# Patient Record
Sex: Male | Born: 1940 | Race: White | Hispanic: No | Marital: Married | Smoking: Former smoker
Health system: Southern US, Community
[De-identification: ages and names within clinical notes are randomized; demographics above are authoritative.]

## PROBLEM LIST (undated history)

## (undated) DIAGNOSIS — J189 Pneumonia, unspecified organism: Secondary | ICD-10-CM

## (undated) DIAGNOSIS — R011 Cardiac murmur, unspecified: Secondary | ICD-10-CM

## (undated) DIAGNOSIS — H269 Unspecified cataract: Secondary | ICD-10-CM

## (undated) DIAGNOSIS — K5790 Diverticulosis of intestine, part unspecified, without perforation or abscess without bleeding: Secondary | ICD-10-CM

## (undated) DIAGNOSIS — N200 Calculus of kidney: Secondary | ICD-10-CM

## (undated) DIAGNOSIS — E039 Hypothyroidism, unspecified: Secondary | ICD-10-CM

## (undated) DIAGNOSIS — B351 Tinea unguium: Secondary | ICD-10-CM

## (undated) DIAGNOSIS — I2510 Atherosclerotic heart disease of native coronary artery without angina pectoris: Secondary | ICD-10-CM

## (undated) DIAGNOSIS — I10 Essential (primary) hypertension: Secondary | ICD-10-CM

## (undated) DIAGNOSIS — T7840XA Allergy, unspecified, initial encounter: Secondary | ICD-10-CM

## (undated) DIAGNOSIS — E8881 Metabolic syndrome: Secondary | ICD-10-CM

## (undated) DIAGNOSIS — G4730 Sleep apnea, unspecified: Secondary | ICD-10-CM

## (undated) DIAGNOSIS — M1990 Unspecified osteoarthritis, unspecified site: Secondary | ICD-10-CM

## (undated) DIAGNOSIS — K635 Polyp of colon: Secondary | ICD-10-CM

## (undated) DIAGNOSIS — K219 Gastro-esophageal reflux disease without esophagitis: Secondary | ICD-10-CM

## (undated) DIAGNOSIS — E785 Hyperlipidemia, unspecified: Secondary | ICD-10-CM

## (undated) HISTORY — DX: Metabolic syndrome: E88.81

## (undated) HISTORY — DX: Essential (primary) hypertension: I10

## (undated) HISTORY — DX: Unspecified osteoarthritis, unspecified site: M19.90

## (undated) HISTORY — DX: Atherosclerotic heart disease of native coronary artery without angina pectoris: I25.10

## (undated) HISTORY — PX: REPLACEMENT TOTAL KNEE: SUR1224

## (undated) HISTORY — DX: Cardiac murmur, unspecified: R01.1

## (undated) HISTORY — DX: Calculus of kidney: N20.0

## (undated) HISTORY — DX: Hypothyroidism, unspecified: E03.9

## (undated) HISTORY — DX: Pneumonia, unspecified organism: J18.9

## (undated) HISTORY — DX: Metabolic syndrome: E88.810

## (undated) HISTORY — DX: Polyp of colon: K63.5

## (undated) HISTORY — DX: Unspecified cataract: H26.9

## (undated) HISTORY — PX: CARDIAC CATHETERIZATION: SHX172

## (undated) HISTORY — DX: Hyperlipidemia, unspecified: E78.5

## (undated) HISTORY — DX: Allergy, unspecified, initial encounter: T78.40XA

## (undated) HISTORY — PX: OTHER SURGICAL HISTORY: SHX169

## (undated) HISTORY — DX: Diverticulosis of intestine, part unspecified, without perforation or abscess without bleeding: K57.90

## (undated) HISTORY — PX: EYE SURGERY: SHX253

## (undated) HISTORY — DX: Gastro-esophageal reflux disease without esophagitis: K21.9

## (undated) HISTORY — DX: Sleep apnea, unspecified: G47.30

## (undated) HISTORY — DX: Tinea unguium: B35.1

## (undated) HISTORY — PX: COLONOSCOPY: SHX174

## (undated) HISTORY — PX: JOINT REPLACEMENT: SHX530

---

## 1998-05-09 ENCOUNTER — Ambulatory Visit (HOSPITAL_COMMUNITY): Admission: RE | Admit: 1998-05-09 | Discharge: 1998-05-09 | Payer: Self-pay | Admitting: Gastroenterology

## 2001-08-10 ENCOUNTER — Encounter (INDEPENDENT_AMBULATORY_CARE_PROVIDER_SITE_OTHER): Payer: Self-pay | Admitting: Specialist

## 2001-08-10 ENCOUNTER — Ambulatory Visit (HOSPITAL_COMMUNITY): Admission: RE | Admit: 2001-08-10 | Discharge: 2001-08-10 | Payer: Self-pay | Admitting: Gastroenterology

## 2001-11-02 ENCOUNTER — Encounter: Payer: Self-pay | Admitting: Specialist

## 2001-11-09 ENCOUNTER — Inpatient Hospital Stay (HOSPITAL_COMMUNITY): Admission: RE | Admit: 2001-11-09 | Discharge: 2001-11-12 | Payer: Self-pay | Admitting: Specialist

## 2003-02-18 ENCOUNTER — Emergency Department (HOSPITAL_COMMUNITY): Admission: EM | Admit: 2003-02-18 | Discharge: 2003-02-18 | Payer: Self-pay | Admitting: Emergency Medicine

## 2003-02-21 ENCOUNTER — Emergency Department (HOSPITAL_COMMUNITY): Admission: EM | Admit: 2003-02-21 | Discharge: 2003-02-21 | Payer: Self-pay | Admitting: Emergency Medicine

## 2004-10-16 ENCOUNTER — Ambulatory Visit: Payer: Self-pay | Admitting: Internal Medicine

## 2005-06-07 ENCOUNTER — Ambulatory Visit: Payer: Self-pay | Admitting: Internal Medicine

## 2005-06-10 ENCOUNTER — Ambulatory Visit: Payer: Self-pay | Admitting: Internal Medicine

## 2005-08-21 ENCOUNTER — Ambulatory Visit: Payer: Self-pay | Admitting: Internal Medicine

## 2005-09-02 ENCOUNTER — Ambulatory Visit: Payer: Self-pay | Admitting: Internal Medicine

## 2005-09-12 ENCOUNTER — Ambulatory Visit: Payer: Self-pay | Admitting: Internal Medicine

## 2005-09-27 ENCOUNTER — Ambulatory Visit: Payer: Self-pay | Admitting: Internal Medicine

## 2005-10-29 ENCOUNTER — Ambulatory Visit: Payer: Self-pay | Admitting: Internal Medicine

## 2006-01-16 ENCOUNTER — Ambulatory Visit (HOSPITAL_COMMUNITY): Admission: RE | Admit: 2006-01-16 | Discharge: 2006-01-16 | Payer: Self-pay | Admitting: Orthopedic Surgery

## 2006-04-10 ENCOUNTER — Ambulatory Visit: Payer: Self-pay | Admitting: Internal Medicine

## 2006-11-26 ENCOUNTER — Ambulatory Visit: Payer: Self-pay | Admitting: Internal Medicine

## 2007-01-12 ENCOUNTER — Ambulatory Visit: Payer: Self-pay | Admitting: Internal Medicine

## 2007-03-27 ENCOUNTER — Ambulatory Visit (HOSPITAL_COMMUNITY): Admission: RE | Admit: 2007-03-27 | Discharge: 2007-03-27 | Payer: Self-pay | Admitting: Specialist

## 2007-04-28 ENCOUNTER — Telehealth: Payer: Self-pay | Admitting: Internal Medicine

## 2007-06-11 ENCOUNTER — Ambulatory Visit: Admission: RE | Admit: 2007-06-11 | Discharge: 2007-06-11 | Payer: Self-pay | Admitting: Specialist

## 2007-08-13 ENCOUNTER — Encounter: Payer: Self-pay | Admitting: Internal Medicine

## 2007-09-17 ENCOUNTER — Encounter: Payer: Self-pay | Admitting: Internal Medicine

## 2007-10-19 ENCOUNTER — Telehealth (INDEPENDENT_AMBULATORY_CARE_PROVIDER_SITE_OTHER): Payer: Self-pay | Admitting: *Deleted

## 2007-10-19 ENCOUNTER — Ambulatory Visit: Payer: Self-pay | Admitting: Internal Medicine

## 2007-10-29 LAB — CONVERTED CEMR LAB: TSH: 3.26 microintl units/mL (ref 0.35–5.50)

## 2007-10-30 ENCOUNTER — Encounter (INDEPENDENT_AMBULATORY_CARE_PROVIDER_SITE_OTHER): Payer: Self-pay | Admitting: *Deleted

## 2007-12-30 ENCOUNTER — Telehealth (INDEPENDENT_AMBULATORY_CARE_PROVIDER_SITE_OTHER): Payer: Self-pay | Admitting: *Deleted

## 2008-01-14 ENCOUNTER — Encounter: Payer: Self-pay | Admitting: Internal Medicine

## 2008-01-14 DIAGNOSIS — Z86010 Personal history of colonic polyps: Secondary | ICD-10-CM | POA: Insufficient documentation

## 2008-01-19 ENCOUNTER — Emergency Department (HOSPITAL_COMMUNITY): Admission: EM | Admit: 2008-01-19 | Discharge: 2008-01-19 | Payer: Self-pay | Admitting: Emergency Medicine

## 2008-01-22 ENCOUNTER — Emergency Department (HOSPITAL_COMMUNITY): Admission: EM | Admit: 2008-01-22 | Discharge: 2008-01-22 | Payer: Self-pay | Admitting: Emergency Medicine

## 2008-02-15 ENCOUNTER — Ambulatory Visit: Payer: Self-pay | Admitting: Internal Medicine

## 2008-02-15 DIAGNOSIS — E8881 Metabolic syndrome: Secondary | ICD-10-CM | POA: Insufficient documentation

## 2008-02-15 DIAGNOSIS — E039 Hypothyroidism, unspecified: Secondary | ICD-10-CM | POA: Insufficient documentation

## 2008-02-15 DIAGNOSIS — E785 Hyperlipidemia, unspecified: Secondary | ICD-10-CM | POA: Insufficient documentation

## 2008-02-15 DIAGNOSIS — Z8739 Personal history of other diseases of the musculoskeletal system and connective tissue: Secondary | ICD-10-CM | POA: Insufficient documentation

## 2008-02-15 LAB — CONVERTED CEMR LAB: Cholesterol, target level: 200 mg/dL

## 2008-02-18 ENCOUNTER — Encounter (INDEPENDENT_AMBULATORY_CARE_PROVIDER_SITE_OTHER): Payer: Self-pay | Admitting: *Deleted

## 2008-02-18 LAB — CONVERTED CEMR LAB
ALT: 22 units/L (ref 0–53)
AST: 27 units/L (ref 0–37)
Basophils Relative: 0.1 % (ref 0.0–1.0)
Hemoglobin: 15.3 g/dL (ref 13.0–17.0)
Hgb A1c MFr Bld: 5.6 % (ref 4.6–6.0)
LDL Cholesterol: 74 mg/dL (ref 0–99)
Lymphocytes Relative: 18.6 % (ref 12.0–46.0)
Monocytes Relative: 9.3 % (ref 3.0–12.0)
Neutro Abs: 4.1 10*3/uL (ref 1.4–7.7)
RBC: 4.7 M/uL (ref 4.22–5.81)
RDW: 11.8 % (ref 11.5–14.6)
Total Bilirubin: 1.1 mg/dL (ref 0.3–1.2)
Total CHOL/HDL Ratio: 3.7
Uric Acid, Serum: 6 mg/dL (ref 4.0–7.8)
WBC: 6 10*3/uL (ref 4.5–10.5)

## 2008-03-30 ENCOUNTER — Ambulatory Visit: Payer: Self-pay | Admitting: Family Medicine

## 2008-04-07 ENCOUNTER — Telehealth (INDEPENDENT_AMBULATORY_CARE_PROVIDER_SITE_OTHER): Payer: Self-pay | Admitting: *Deleted

## 2008-05-30 ENCOUNTER — Ambulatory Visit: Payer: Self-pay | Admitting: Internal Medicine

## 2008-05-30 DIAGNOSIS — R351 Nocturia: Secondary | ICD-10-CM | POA: Insufficient documentation

## 2008-05-30 LAB — CONVERTED CEMR LAB
Bilirubin Urine: NEGATIVE
Glucose, Urine, Semiquant: NEGATIVE
Protein, U semiquant: NEGATIVE
Specific Gravity, Urine: <1.005
pH: 5

## 2008-06-01 ENCOUNTER — Encounter (INDEPENDENT_AMBULATORY_CARE_PROVIDER_SITE_OTHER): Payer: Self-pay | Admitting: *Deleted

## 2008-06-01 LAB — CONVERTED CEMR LAB
Creatinine, Ser: 0.9 mg/dL (ref 0.4–1.5)
Hgb A1c MFr Bld: 5.8 % (ref 4.6–6.0)

## 2008-06-21 ENCOUNTER — Telehealth (INDEPENDENT_AMBULATORY_CARE_PROVIDER_SITE_OTHER): Payer: Self-pay | Admitting: *Deleted

## 2008-06-23 ENCOUNTER — Encounter: Payer: Self-pay | Admitting: Internal Medicine

## 2008-06-27 ENCOUNTER — Ambulatory Visit: Payer: Self-pay | Admitting: Internal Medicine

## 2008-06-27 DIAGNOSIS — T887XXA Unspecified adverse effect of drug or medicament, initial encounter: Secondary | ICD-10-CM | POA: Insufficient documentation

## 2008-07-12 ENCOUNTER — Encounter: Payer: Self-pay | Admitting: Internal Medicine

## 2008-07-29 ENCOUNTER — Encounter (INDEPENDENT_AMBULATORY_CARE_PROVIDER_SITE_OTHER): Payer: Self-pay | Admitting: *Deleted

## 2008-07-29 ENCOUNTER — Ambulatory Visit: Payer: Self-pay | Admitting: Internal Medicine

## 2008-07-29 DIAGNOSIS — B351 Tinea unguium: Secondary | ICD-10-CM | POA: Insufficient documentation

## 2008-08-05 ENCOUNTER — Encounter: Payer: Self-pay | Admitting: Internal Medicine

## 2008-08-05 ENCOUNTER — Ambulatory Visit: Payer: Self-pay | Admitting: Pulmonary Disease

## 2008-08-05 DIAGNOSIS — K219 Gastro-esophageal reflux disease without esophagitis: Secondary | ICD-10-CM | POA: Insufficient documentation

## 2008-08-05 DIAGNOSIS — G4733 Obstructive sleep apnea (adult) (pediatric): Secondary | ICD-10-CM | POA: Insufficient documentation

## 2008-08-05 DIAGNOSIS — Z9989 Dependence on other enabling machines and devices: Secondary | ICD-10-CM

## 2008-08-12 ENCOUNTER — Encounter: Payer: Self-pay | Admitting: Internal Medicine

## 2008-08-26 ENCOUNTER — Encounter: Payer: Self-pay | Admitting: Family Medicine

## 2008-08-26 LAB — HM COLONOSCOPY

## 2008-09-05 ENCOUNTER — Ambulatory Visit (HOSPITAL_BASED_OUTPATIENT_CLINIC_OR_DEPARTMENT_OTHER): Admission: RE | Admit: 2008-09-05 | Discharge: 2008-09-05 | Payer: Self-pay | Admitting: Pulmonary Disease

## 2008-09-05 ENCOUNTER — Encounter: Payer: Self-pay | Admitting: Pulmonary Disease

## 2008-09-07 ENCOUNTER — Ambulatory Visit: Payer: Self-pay | Admitting: Pulmonary Disease

## 2008-09-09 ENCOUNTER — Encounter: Payer: Self-pay | Admitting: Internal Medicine

## 2008-09-13 ENCOUNTER — Encounter: Payer: Self-pay | Admitting: Pulmonary Disease

## 2008-09-19 ENCOUNTER — Ambulatory Visit: Payer: Self-pay | Admitting: Pulmonary Disease

## 2008-09-30 ENCOUNTER — Ambulatory Visit: Payer: Self-pay | Admitting: Internal Medicine

## 2008-10-10 ENCOUNTER — Ambulatory Visit: Payer: Self-pay | Admitting: Internal Medicine

## 2008-10-10 ENCOUNTER — Encounter: Payer: Self-pay | Admitting: Pulmonary Disease

## 2008-10-28 HISTORY — PX: CORONARY ARTERY BYPASS GRAFT: SHX141

## 2008-10-31 ENCOUNTER — Telehealth (INDEPENDENT_AMBULATORY_CARE_PROVIDER_SITE_OTHER): Payer: Self-pay | Admitting: *Deleted

## 2008-10-31 ENCOUNTER — Encounter: Payer: Self-pay | Admitting: Pulmonary Disease

## 2008-11-18 ENCOUNTER — Encounter: Admission: RE | Admit: 2008-11-18 | Discharge: 2008-11-18 | Payer: Self-pay | Admitting: Orthopedic Surgery

## 2009-02-23 ENCOUNTER — Telehealth (INDEPENDENT_AMBULATORY_CARE_PROVIDER_SITE_OTHER): Payer: Self-pay | Admitting: *Deleted

## 2009-04-10 ENCOUNTER — Ambulatory Visit: Payer: Self-pay | Admitting: Cardiology

## 2009-04-10 ENCOUNTER — Ambulatory Visit: Payer: Self-pay | Admitting: Internal Medicine

## 2009-04-10 DIAGNOSIS — K5730 Diverticulosis of large intestine without perforation or abscess without bleeding: Secondary | ICD-10-CM | POA: Insufficient documentation

## 2009-04-10 DIAGNOSIS — I10 Essential (primary) hypertension: Secondary | ICD-10-CM | POA: Insufficient documentation

## 2009-04-11 ENCOUNTER — Encounter (INDEPENDENT_AMBULATORY_CARE_PROVIDER_SITE_OTHER): Payer: Self-pay | Admitting: *Deleted

## 2009-04-12 ENCOUNTER — Encounter: Payer: Self-pay | Admitting: Internal Medicine

## 2009-04-14 ENCOUNTER — Encounter (INDEPENDENT_AMBULATORY_CARE_PROVIDER_SITE_OTHER): Payer: Self-pay | Admitting: *Deleted

## 2009-04-14 ENCOUNTER — Telehealth (INDEPENDENT_AMBULATORY_CARE_PROVIDER_SITE_OTHER): Payer: Self-pay | Admitting: *Deleted

## 2009-04-21 ENCOUNTER — Encounter: Payer: Self-pay | Admitting: Internal Medicine

## 2009-04-27 ENCOUNTER — Ambulatory Visit (HOSPITAL_BASED_OUTPATIENT_CLINIC_OR_DEPARTMENT_OTHER): Admission: RE | Admit: 2009-04-27 | Discharge: 2009-04-27 | Payer: Self-pay | Admitting: Urology

## 2009-04-28 ENCOUNTER — Encounter: Payer: Self-pay | Admitting: Pulmonary Disease

## 2009-05-08 DIAGNOSIS — R011 Cardiac murmur, unspecified: Secondary | ICD-10-CM | POA: Insufficient documentation

## 2009-05-09 ENCOUNTER — Ambulatory Visit: Payer: Self-pay | Admitting: Cardiovascular Disease

## 2009-05-09 DIAGNOSIS — R0602 Shortness of breath: Secondary | ICD-10-CM | POA: Insufficient documentation

## 2009-05-09 DIAGNOSIS — I4949 Other premature depolarization: Secondary | ICD-10-CM | POA: Insufficient documentation

## 2009-05-24 ENCOUNTER — Telehealth (INDEPENDENT_AMBULATORY_CARE_PROVIDER_SITE_OTHER): Payer: Self-pay | Admitting: *Deleted

## 2009-05-25 ENCOUNTER — Ambulatory Visit: Payer: Self-pay | Admitting: Cardiovascular Disease

## 2009-05-25 ENCOUNTER — Ambulatory Visit: Payer: Self-pay

## 2009-05-25 ENCOUNTER — Inpatient Hospital Stay (HOSPITAL_COMMUNITY): Admission: RE | Admit: 2009-05-25 | Discharge: 2009-06-03 | Payer: Self-pay | Admitting: Cardiovascular Disease

## 2009-05-25 ENCOUNTER — Encounter: Payer: Self-pay | Admitting: Internal Medicine

## 2009-05-25 ENCOUNTER — Encounter: Payer: Self-pay | Admitting: Cardiovascular Disease

## 2009-05-26 ENCOUNTER — Ambulatory Visit: Payer: Self-pay | Admitting: Surgery

## 2009-05-26 ENCOUNTER — Encounter: Payer: Self-pay | Admitting: Cardiovascular Disease

## 2009-05-26 ENCOUNTER — Encounter: Payer: Self-pay | Admitting: Cardiology

## 2009-05-29 ENCOUNTER — Encounter: Payer: Self-pay | Admitting: Cardiovascular Disease

## 2009-05-30 ENCOUNTER — Encounter: Payer: Self-pay | Admitting: Internal Medicine

## 2009-06-05 ENCOUNTER — Encounter: Payer: Self-pay | Admitting: Pulmonary Disease

## 2009-06-08 ENCOUNTER — Encounter: Payer: Self-pay | Admitting: Cardiovascular Disease

## 2009-06-08 ENCOUNTER — Ambulatory Visit: Payer: Self-pay | Admitting: Cardiothoracic Surgery

## 2009-06-09 ENCOUNTER — Encounter: Payer: Self-pay | Admitting: Cardiovascular Disease

## 2009-06-13 ENCOUNTER — Telehealth (INDEPENDENT_AMBULATORY_CARE_PROVIDER_SITE_OTHER): Payer: Self-pay | Admitting: *Deleted

## 2009-06-20 ENCOUNTER — Ambulatory Visit: Payer: Self-pay | Admitting: Surgery

## 2009-06-20 ENCOUNTER — Ambulatory Visit: Payer: Self-pay | Admitting: Cardiovascular Disease

## 2009-06-20 ENCOUNTER — Encounter: Admission: RE | Admit: 2009-06-20 | Discharge: 2009-06-20 | Payer: Self-pay | Admitting: Surgery

## 2009-06-20 DIAGNOSIS — I4891 Unspecified atrial fibrillation: Secondary | ICD-10-CM | POA: Insufficient documentation

## 2009-06-20 DIAGNOSIS — Z951 Presence of aortocoronary bypass graft: Secondary | ICD-10-CM | POA: Insufficient documentation

## 2009-06-22 ENCOUNTER — Encounter (HOSPITAL_COMMUNITY): Admission: RE | Admit: 2009-06-22 | Discharge: 2009-09-20 | Payer: Self-pay | Admitting: Cardiovascular Disease

## 2009-06-22 ENCOUNTER — Encounter: Payer: Self-pay | Admitting: Cardiovascular Disease

## 2009-06-23 ENCOUNTER — Telehealth (INDEPENDENT_AMBULATORY_CARE_PROVIDER_SITE_OTHER): Payer: Self-pay | Admitting: *Deleted

## 2009-06-29 ENCOUNTER — Ambulatory Visit: Payer: Self-pay | Admitting: Cardiothoracic Surgery

## 2009-06-30 ENCOUNTER — Telehealth: Payer: Self-pay | Admitting: Cardiovascular Disease

## 2009-07-04 ENCOUNTER — Ambulatory Visit: Payer: Self-pay | Admitting: Surgery

## 2009-07-06 ENCOUNTER — Encounter: Payer: Self-pay | Admitting: Cardiovascular Disease

## 2009-07-07 ENCOUNTER — Encounter: Payer: Self-pay | Admitting: Cardiovascular Disease

## 2009-07-13 ENCOUNTER — Encounter: Payer: Self-pay | Admitting: Cardiovascular Disease

## 2009-07-14 ENCOUNTER — Encounter: Payer: Self-pay | Admitting: Internal Medicine

## 2009-07-17 ENCOUNTER — Telehealth: Payer: Self-pay | Admitting: Cardiovascular Disease

## 2009-07-24 ENCOUNTER — Encounter: Payer: Self-pay | Admitting: Cardiovascular Disease

## 2009-07-26 ENCOUNTER — Encounter (INDEPENDENT_AMBULATORY_CARE_PROVIDER_SITE_OTHER): Payer: Self-pay | Admitting: *Deleted

## 2009-07-31 ENCOUNTER — Encounter: Payer: Self-pay | Admitting: Cardiovascular Disease

## 2009-08-01 ENCOUNTER — Ambulatory Visit: Payer: Self-pay | Admitting: Cardiovascular Disease

## 2009-08-01 ENCOUNTER — Encounter (INDEPENDENT_AMBULATORY_CARE_PROVIDER_SITE_OTHER): Payer: Self-pay | Admitting: *Deleted

## 2009-08-10 ENCOUNTER — Encounter: Payer: Self-pay | Admitting: Cardiovascular Disease

## 2009-08-10 ENCOUNTER — Ambulatory Visit: Payer: Self-pay | Admitting: Internal Medicine

## 2009-08-18 ENCOUNTER — Encounter: Payer: Self-pay | Admitting: Cardiovascular Disease

## 2009-08-21 ENCOUNTER — Ambulatory Visit: Payer: Self-pay | Admitting: Pulmonary Disease

## 2009-08-25 ENCOUNTER — Encounter: Payer: Self-pay | Admitting: Cardiovascular Disease

## 2009-09-04 ENCOUNTER — Encounter: Payer: Self-pay | Admitting: Pulmonary Disease

## 2009-09-12 ENCOUNTER — Encounter: Payer: Self-pay | Admitting: Cardiovascular Disease

## 2009-09-21 ENCOUNTER — Encounter (HOSPITAL_COMMUNITY): Admission: RE | Admit: 2009-09-21 | Discharge: 2009-10-25 | Payer: Self-pay | Admitting: Cardiovascular Disease

## 2009-09-28 ENCOUNTER — Telehealth: Payer: Self-pay | Admitting: Cardiovascular Disease

## 2009-11-14 ENCOUNTER — Encounter: Payer: Self-pay | Admitting: Internal Medicine

## 2009-12-04 ENCOUNTER — Ambulatory Visit: Payer: Self-pay | Admitting: Internal Medicine

## 2009-12-13 ENCOUNTER — Encounter: Payer: Self-pay | Admitting: Pulmonary Disease

## 2009-12-19 ENCOUNTER — Telehealth (INDEPENDENT_AMBULATORY_CARE_PROVIDER_SITE_OTHER): Payer: Self-pay | Admitting: *Deleted

## 2010-01-24 ENCOUNTER — Ambulatory Visit: Payer: Self-pay | Admitting: Cardiovascular Disease

## 2010-01-31 ENCOUNTER — Telehealth (INDEPENDENT_AMBULATORY_CARE_PROVIDER_SITE_OTHER): Payer: Self-pay | Admitting: *Deleted

## 2010-05-18 ENCOUNTER — Ambulatory Visit: Payer: Self-pay | Admitting: Internal Medicine

## 2010-05-18 DIAGNOSIS — I2510 Atherosclerotic heart disease of native coronary artery without angina pectoris: Secondary | ICD-10-CM | POA: Insufficient documentation

## 2010-05-18 DIAGNOSIS — Z87442 Personal history of urinary calculi: Secondary | ICD-10-CM | POA: Insufficient documentation

## 2010-05-25 ENCOUNTER — Telehealth (INDEPENDENT_AMBULATORY_CARE_PROVIDER_SITE_OTHER): Payer: Self-pay | Admitting: *Deleted

## 2010-06-22 ENCOUNTER — Telehealth (INDEPENDENT_AMBULATORY_CARE_PROVIDER_SITE_OTHER): Payer: Self-pay | Admitting: *Deleted

## 2010-06-28 ENCOUNTER — Ambulatory Visit: Payer: Self-pay | Admitting: Cardiovascular Disease

## 2010-08-28 ENCOUNTER — Ambulatory Visit: Payer: Self-pay | Admitting: Internal Medicine

## 2010-10-02 ENCOUNTER — Encounter: Payer: Self-pay | Admitting: Internal Medicine

## 2010-10-17 ENCOUNTER — Telehealth: Payer: Self-pay | Admitting: Internal Medicine

## 2010-11-19 ENCOUNTER — Telehealth (INDEPENDENT_AMBULATORY_CARE_PROVIDER_SITE_OTHER): Payer: Self-pay | Admitting: *Deleted

## 2010-11-22 ENCOUNTER — Ambulatory Visit
Admission: RE | Admit: 2010-11-22 | Discharge: 2010-11-22 | Payer: Self-pay | Source: Home / Self Care | Attending: Internal Medicine | Admitting: Internal Medicine

## 2010-11-22 ENCOUNTER — Other Ambulatory Visit: Payer: Self-pay | Admitting: Internal Medicine

## 2010-11-22 LAB — HEMOGLOBIN A1C: Hgb A1c MFr Bld: 5.7 % (ref 4.6–6.5)

## 2010-11-25 LAB — CONVERTED CEMR LAB
ALT: 18 units/L (ref 0–53)
ALT: 22 units/L (ref 0–40)
ALT: 24 units/L (ref 0–53)
AST: 23 units/L (ref 0–37)
AST: 25 units/L (ref 0–37)
AST: 30 units/L (ref 0–37)
Albumin: 4.4 g/dL (ref 3.5–5.2)
Albumin: 4.4 g/dL (ref 3.5–5.2)
BUN: 18 mg/dL (ref 6–23)
Basophils Absolute: 0 10*3/uL (ref 0.0–0.1)
Basophils Absolute: 0 10*3/uL (ref 0.0–0.1)
Bilirubin, Direct: 0.2 mg/dL (ref 0.0–0.3)
Calcium: 9.2 mg/dL (ref 8.4–10.5)
Calcium: 9.6 mg/dL (ref 8.4–10.5)
Chloride: 104 meq/L (ref 96–112)
Chloride: 106 meq/L (ref 96–112)
Cholesterol: 126 mg/dL (ref 0–200)
Cholesterol: 126 mg/dL (ref 0–200)
Creatinine, Ser: 1 mg/dL (ref 0.4–1.5)
Eosinophils Absolute: 0.2 10*3/uL (ref 0.0–0.6)
Eosinophils Relative: 2.2 % (ref 0.0–5.0)
Eosinophils Relative: 2.9 % (ref 0.0–5.0)
Eosinophils Relative: 3.8 % (ref 0.0–5.0)
GFR calc non Af Amer: 78.89 mL/min (ref >60)
GFR calc non Af Amer: 80 mL/min
GFR calc non Af Amer: 85.51 mL/min (ref >60)
Glucose, Bld: 103 mg/dL — ABNORMAL HIGH (ref 70–99)
Glucose, Bld: 111 mg/dL — ABNORMAL HIGH (ref 70–99)
HCT: 42.1 % (ref 39.0–52.0)
HDL: 40.8 mg/dL (ref >39.00)
HDL: 42.6 mg/dL (ref >39.00)
Hemoglobin: 14.5 g/dL (ref 13.0–17.0)
Hgb A1c MFr Bld: 5.4 % (ref 4.6–6.0)
Hgb A1c MFr Bld: 5.7 % (ref 4.6–6.5)
LDL Cholesterol: 67 mg/dL (ref 0–99)
LDL Cholesterol: 70 mg/dL (ref 0–99)
Lymphs Abs: 1.6 10*3/uL (ref 0.7–4.0)
MCV: 92.3 fL (ref 78.0–100.0)
Monocytes Absolute: 0.5 10*3/uL (ref 0.1–1.0)
Monocytes Relative: 10.1 % (ref 3.0–12.0)
Monocytes Relative: 8.3 % (ref 3.0–12.0)
Neutro Abs: 2.3 10*3/uL (ref 1.4–7.7)
Neutrophils Relative %: 50.2 % (ref 43.0–77.0)
PSA: 0.65 ng/mL (ref 0.10–4.00)
Platelets: 159 10*3/uL (ref 150.0–400.0)
Platelets: 171 10*3/uL (ref 150–400)
RBC: 4.89 M/uL (ref 4.22–5.81)
RDW: 12.4 % (ref 11.5–14.6)
RDW: 12.8 % (ref 11.5–14.6)
TSH: 3.69 microintl units/mL (ref 0.35–5.50)
TSH: 3.7 microintl units/mL (ref 0.35–5.50)
Total Bilirubin: 1.5 mg/dL — ABNORMAL HIGH (ref 0.3–1.2)
Total CHOL/HDL Ratio: 3
Total CHOL/HDL Ratio: 3
Triglycerides: 100 mg/dL (ref 0–149)
Triglycerides: 68 mg/dL (ref 0.0–149.0)
Triglycerides: 82 mg/dL (ref 0.0–149.0)
VLDL: 16.4 mg/dL (ref 0.0–40.0)
WBC: 4.5 10*3/uL (ref 4.5–10.5)
WBC: 5.1 10*3/uL (ref 4.5–10.5)
WBC: 9 10*3/uL (ref 4.5–10.5)

## 2010-11-27 NOTE — Progress Notes (Signed)
Summary: REFILL  Phone Note Refill Request Call back at Home Phone [phone] Message from:  Patient on December 19, 2009 9:06 AM  Refills Requested: Medication #1:  CRESTOR 20 MG TABS 1 by mouth once daily  Method Requested: Pick up at Office Next Appointment Scheduled: NO APPT Initial call taken by: Barb Merino,  December 19, 2009 9:07 AM  Follow-up for Phone Call        Patient aware rx is ready for pick-up Follow-up by: Shonna Chock,  December 19, 2009 10:20 AM    Prescriptions: CRESTOR 20 MG TABS (ROSUVASTATIN CALCIUM) 1 by mouth once daily  #90 x 1   Entered by:   Shonna Chock   Authorized by:   Marga Melnick MD   Signed by:   Shonna Chock on 12/19/2009   Method used:   Print then Give to Patient   RxID:   [id number]

## 2010-11-27 NOTE — Progress Notes (Signed)
Summary: REFILL REQUEST  Phone Note Refill Request Call back at [phone] Message from:  Pharmacy on June 22, 2010 8:31 AM  Refills Requested: Medication #1:  NEXIUM 40 MG  CPDR 1 by mouth once daily   Dosage confirmed as above?Dosage Confirmed   Supply Requested: 3 months [facility]  Initial call taken by: Lavell Islam,  June 22, 2010 8:31 AM  Follow-up for Phone Call        RX faxed to : [phone] Follow-up by: Shonna Chock CMA,  June 22, 2010 10:15 AM    Prescriptions: NEXIUM 40 MG  CPDR (ESOMEPRAZOLE MAGNESIUM) 1 by mouth once daily  #90 x 3   Entered by:   Shonna Chock CMA   Authorized by:   Marga Melnick MD   Signed by:   Shonna Chock CMA on 06/22/2010   Method used:   Print then Give to Patient   RxID:   [id number]

## 2010-11-27 NOTE — Assessment & Plan Note (Signed)
Summary: flu shot/cbs  Nurse Visit  CC: Flu shot./kb   Allergies: 1)  ! Floxin 2)  ! Betadine  Orders Added: 1)  Admin 1st Vaccine [90471] 2)  Flu Vaccine 34yrs + [16109]       Flu Vaccine Consent Questions     Do you have a history of severe allergic reactions to this vaccine? no    Any prior history of allergic reactions to egg and/or gelatin? no    Do you have a sensitivity to the preservative Thimersol? no    Do you have a past history of Guillan-Barre Syndrome? no    Do you currently have an acute febrile illness? no    Have you ever had a severe reaction to latex? no    Vaccine information given and explained to patient? yes    Are you currently pregnant? no    Lot Number:[id number]   Exp Date:04/27/2011   Site Given  Left Deltoid IMu

## 2010-11-27 NOTE — Letter (Signed)
Summary: DME/[facility]  DME/[facility]   Imported By: Lester [place] 12/19/2009 09:34:08  _____________________________________________________________________  External Attachment:    Type:   Image     Comment:   External Document

## 2010-11-27 NOTE — Progress Notes (Signed)
Summary: REFILL REQUEST  Phone Note Refill Request Call back at [phone] Message from:  Pharmacy on May 25, 2010 10:10 AM  Refills Requested: Medication #1:  CRESTOR 20 MG TABS 1 by mouth once daily   Dosage confirmed as above?Dosage Confirmed   Supply Requested: 3 months [facility]  Initial call taken by: Lavell Islam,  May 25, 2010 10:10 AM  Follow-up for Phone Call        RX faxed to: [phone] Follow-up by: Shonna Chock CMA,  May 25, 2010 1:59 PM    Prescriptions: CRESTOR 20 MG TABS (ROSUVASTATIN CALCIUM) 1 by mouth once daily  #90 x 3   Entered by:   Shonna Chock CMA   Authorized by:   Marga Melnick MD   Signed by:   Shonna Chock CMA on 05/25/2010   Method used:   Print then Give to Patient   RxID:   [id number]

## 2010-11-27 NOTE — Assessment & Plan Note (Signed)
Summary: per check out/sf   Referring Provider:  Dr. Marga Melnick Primary Provider:  Marga Melnick MD   History of Present Illness: Steven Munoz is S/P CABG with periop afib in 05/2009 .  He has recovered well.  He has good LV function. He finished caardiac rehab.   His wounds have healed well.  He was D/C on amiodarone and has maintained NSR.  He is not on coumadin.  There has been no SSCP, palpitations, PND, orthopne, and only trace edema   The amiodarone is now stopped.  He is compliant with his meds for BP and cholesterol.  He is on amoxacillin now per Dr Alwyn Ren for sinus infection.  His BP is borderline and he has LE edema.  I think it is reasonable to add HCTZ to his Benicar.   Current Medications (verified): 1)  Crestor 20 Mg Tabs (Rosuvastatin Calcium) .Marland Kitchen.. 1 By Mouth Once Daily 2)  Levothroid 25 Mcg  Tabs (Levothyroxine Sodium) .Marland Kitchen.. 1 By Mouth Once Daily Except 1 and 1/2 Tabs Tues,thur, Saturday 3)  Metoprolol Tartrate 25 Mg Tabs (Metoprolol Tartrate) .... 1/2 Tab By Mouth Two Times A Day 4)  Nexium 40 Mg  Cpdr (Esomeprazole Magnesium) .Marland Kitchen.. 1 By Mouth Once Daily 5)  Folic Acid 600 Mcg Tabs (Folic Acid) .Marland Kitchen.. 1 Tab By Mouth Once Daily 6)  Fish Oil 1200 Mg Caps (Omega-3 Fatty Acids) .... 2 By Mouth Once Daily 7)  Aspirin Ec 325 Mg Tbec (Aspirin) .... Take One Tablet By Mouth Daily 8)  Benicar 20 Mg Tabs (Olmesartan Medoxomil) .... Take One Tablet By Mouth Daily 9)  Cpap +8 Cm, With Large Full Face Mask .... As Directed 10)  Co Q-10 200 Mg Caps (Coenzyme Q10) .Marland Kitchen.. 1 Tab By Mouth Once Daily 11)  Multivitamins   Tabs (Multiple Vitamin) .Marland Kitchen.. 1 Tab By Mouth Once Daily 12)  Fish Oil   Oil (Fish Oil) .... 2 Tabs By Mouth Once Daily  Allergies (verified): 1)  ! Floxin 2)  ! Betadine  Past History:  Past Medical History: Last updated: 05/08/2009 Current Problems:  MURMUR (ICD-785.2) HYPOTHYROIDISM (ICD-244.9) HYPERLIPIDEMIA (ICD-272.4) ESSENTIAL HYPERTENSION  (ICD-401.9) HYPERCHOLESTEROLEMIA (ICD-272.0) NONSPECIFIC ABNORMAL ELECTROCARDIOGRAM (ICD-794.31) ROUTINE GENERAL MEDICAL EXAM@HEALTH  CARE FACL (ICD-V70.0) FLANK PAIN (ICD-789.09) GROSS HEMATURIA (ICD-599.71) DIVERTICULOSIS, COLON (ICD-562.10) G E R D (ICD-530.81) OBSTRUCTIVE SLEEP APNEA (ICD-780.57) CENTRAL SLEEP APNEA CONDS CLASSIFIED ELSEWHERE (ICD-327.27) DERMATOPHYTOSIS OF NAIL (ICD-110.1) UNS ADVRS EFF UNS RX MEDICINAL&BIOLOGICAL SBSTNC (ICD-995.20) NOCTURIA (ICD-788.43) SCIATICA, RIGHT (ICD-724.3) DYSMETABOLIC SYNDROME X (ICD-277.7) SPECIAL SCREENING MALIGNANT NEOPLASM OF PROSTATE (ICD-V76.44) GERD (ICD-530.81) GOUT (ICD-274.9) COLONIC POLYPS, HX OF (ICD-V12.72)  Hypertension borderline at times "marginal" increased homocysteine increased bp response with exercise Gout, Dr Kellie Simmering Sleep Apnea, CPAP  Past Surgical History: Last updated: 08/01/2009 Median sternotomy, extracorporeal circulation, coronary bypass graft surgery x3 using a left internal mammary artery graft to left anterior descending coronary artery, with a sequential  saphenous vein graft to posterior descending and posterolateral branches  of the right coronary artery.  Endoscopic vein harvesting from the right   leg.  [name], M.D.  Electronically Signed  BB/MEDQ  D:  05/29/2009  T:  05/30/2009  Job:  [id number]    cc:   [facility]   knee surgery-arthroscopy right knee X 2;X1 L knee pneumonia at 70  years of age colonoscopy X3----tics 11/09/01-knee replacement Multiple colonoscopies-polyps resected  repeatedly (due 2014)  ; S/P esophageal dilation X1 ? by Dr Kinnie Scales partial knee replacement March 2009  Family History: Last updated: 04/10/2009 mother-lung ca, liver  ca, smoker, htn, tia father-mi  @ age 12 brother- mi @  34  Social History: Last updated: 05/09/2009 former smoker; quit  in 1968 smoked for 6 years  etoh-occasional ; on  no diet Married with 2 children.  Son is a Education officer, community in  [place] daughter living with him now Previous work in Engineer, maintenance (IT) to [employer]  Review of Systems       Denies fever, malais, weight loss, blurry vision, decreased visual acuity, cough, sputum, SOB, hemoptysis, pleuritic pain, palpitaitons, heartburn, abdominal pain, melena, lower extremity edema, claudication, or rash.   Vital Signs:  Patient profile:   70 year old male Height:      71 inches Weight:      285 pounds Pulse rate:   49 / minute Resp:     14 per minute BP sitting:   179 / 79  (left arm)  Vitals Entered By: Kem Parkinson (January 24, 2010 11:27 AM)  Physical Exam  General:  Affect appropriate Healthy:  appears stated age HEENT: normal Neck supple with no adenopathy JVP normal no bruits no thyromegaly Lungs clear with no wheezing and good diaphragmatic motion Heart:  S1/S2 systolic murmur,rub, gallop or click PMI normal Abdomen: benighn, BS positve, no tenderness, no AAA no bruit.  No HSM or HJR Distal pulses intact with no bruits mild edema Neuro non-focal Skin warm and dry    Impression & Recommendations:  Problem # 1:  CORONARY ARTERY BYPASS GRAFT, HX OF (ICD-V45.81) Stable no angina continue ASA and BB  Problem # 2:  MURMUR (ICD-785.2)  Benign  consider F/U echo in a year His updated medication list for this problem includes:    Metoprolol Tartrate 25 Mg Tabs (Metoprolol tartrate) .Marland Kitchen... 1/2 tab by mouth two times a day    Benicar Hct 20-12.5 Mg Tabs (Olmesartan medoxomil-hctz) .Marland Kitchen... 1 once daily  His updated medication list for this problem includes:    Metoprolol Tartrate 25 Mg Tabs (Metoprolol tartrate) .Marland Kitchen... 1/2 tab by mouth two times a day    Benicar 20 Mg Tabs (Olmesartan medoxomil) .Marland Kitchen... Take one tablet by mouth daily  Problem # 3:  HYPERLIPIDEMIA (ICD-272.4) At goal with no side effects His updated medication list for this problem includes:    Crestor 20 Mg Tabs (Rosuvastatin calcium) .Marland Kitchen... 1 by mouth once daily  CHOL: 126  (04/10/2009)   LDL: 67 (04/10/2009)   HDL: 42.60 (04/10/2009)   TG: 82.0 (04/10/2009) CHOL (goal): 200 (02/15/2008)   LDL (goal): 130 (02/15/2008)   HDL (goal): 40 (02/15/2008)   TG (goal): 150 (02/15/2008)  Problem # 4:  ESSENTIAL HYPERTENSION (ICD-401.9) Suboptimal control.  Add HCTZ to Benicar to help with edema as well His updated medication list for this problem includes:    Metoprolol Tartrate 25 Mg Tabs (Metoprolol tartrate) .Marland Kitchen... 1/2 tab by mouth two times a day    Aspirin Ec 325 Mg Tbec (Aspirin) .Marland Kitchen... Take one tablet by mouth daily    Benicar Hct 20-12.5 Mg Tabs (Olmesartan medoxomil-hctz) .Marland Kitchen... 1 once daily  Patient Instructions: 1)  Your physician recommends that you schedule a follow-up appointment in: 6 MONTHS WITH DR Eden Emms 2)  Your physician recommends that you continue on your current medications as directed. Please refer to the Current Medication list given to you today. Prescriptions: BENICAR HCT 20-12.5 MG TABS (OLMESARTAN MEDOXOMIL-HCTZ) 1 once daily  #90 x 3   Entered by:   Scherrie Bateman, LPN   Authorized by:   Colon Branch, MD, [facility]  Signed by:   Scherrie Bateman, LPN on 16/07/9603   Method used:   Faxed to ...       [facility] Environmental education officer)       [address] [address]       [place], [place]  [address]       Ph: [phone]       Fax: [phone]   RxID:   [id number] BENICAR HCT 20-12.5 MG TABS (OLMESARTAN MEDOXOMIL-HCTZ) 1 once daily  #90 x 3   Entered by:   Scherrie Bateman, LPN   Authorized by:   Colon Branch, MD, [facility]   Signed by:   Scherrie Bateman, LPN on 08/24/2535   Method used:   Print then Give to Patient   RxID:   [id number]

## 2010-11-27 NOTE — Letter (Signed)
Summary: [facility]   Imported By: Marylou Mccoy 11/16/2009 16:04:33  _____________________________________________________________________  External Attachment:    Type:   Image     Comment:   External Document

## 2010-11-27 NOTE — Assessment & Plan Note (Signed)
Summary: 6 MO F/U ./CY   Referring Provider:  Dr. Marga Melnick Primary Provider:  Marga Melnick MD  CC:  check up.  History of Present Illness: Steven Munoz is S/P CABG with periop afib in 05/2009 .  He has recovered well.  He has good LV function. He finished caardiac rehab.   His wounds have healed well.  He was D/C on amiodarone and has maintained NSR.  He is not on coumadin.  There has been no SSCP, palpitations, PND, orthopne, and only trace edema   The amiodarone is now stopped.  He is compliant with his meds for BP and cholesterol.  He is on amoxacillin now per Dr Alwyn Ren for sinus infection.  His BP is borderline and he had LE edema. HCTZ was added to his meds in March.  He has had improvement in his BP and edema.  He has to stick with a low carb diet as his weight is up again  Current Problems (verified): 1)  Routine General Medical Exam@health  Care Facl  (ICD-V70.0) 2)  Nephrolithiasis, Hx of  (ICD-V13.01) 3)  Coronary Artery Disease  (ICD-414.00) 4)  Atrial Fibrillation  (ICD-427.31) 5)  Coronary Artery Bypass Graft, Hx of  (ICD-V45.81) 6)  Premature Ventricular Contractions  (ICD-427.69) 7)  Dyspnea  (ICD-786.05) 8)  Murmur  (ICD-785.2) 9)  Hypothyroidism  (ICD-244.9) 10)  Hyperlipidemia  (ICD-272.4) 11)  Essential Hypertension  (ICD-401.9) 12)  Diverticulosis, Colon  (ICD-562.10) 13)  G E R D  (ICD-530.81) 14)  Obstructive Sleep Apnea  (ICD-780.57) 15)  Dermatophytosis of Nail  (ICD-110.1) 16)  Uns Advrs Eff Uns Rx Medicinal&biological Sbstnc  (ICD-995.20) 17)  Nocturia  (ICD-788.43) 18)  Dysmetabolic Syndrome X  (ICD-277.7) 19)  Special Screening Malignant Neoplasm of Prostate  (ICD-V76.44) 20)  Gout  (ICD-274.9) 21)  Colonic Polyps, Hx of  (ICD-V12.72)  Current Medications (verified): 1)  Crestor 20 Mg Tabs (Rosuvastatin Calcium) .Marland Kitchen.. 1 By Mouth Once Daily 2)  Levothroid 25 Mcg  Tabs (Levothyroxine Sodium) .Marland Kitchen.. 1 By Mouth Once Daily Except 1 and 1/2 Tabs Tues,thur,  Saturday 3)  Metoprolol Tartrate 25 Mg Tabs (Metoprolol Tartrate) .... 1/2 Tab By Mouth Two Times A Day 4)  Nexium 40 Mg  Cpdr (Esomeprazole Magnesium) .Marland Kitchen.. 1 By Mouth Once Daily 5)  Folic Acid 600 Mcg Tabs (Folic Acid) .Marland Kitchen.. 1 Tab By Mouth Once Daily 6)  Fish Oil 1200 Mg Caps (Omega-3 Fatty Acids) .... 2 By Mouth Once Daily 7)  Aspirin Ec 325 Mg Tbec (Aspirin) .... Take One Tablet By Mouth Daily 8)  Benicar Hct 20-12.5 Mg Tabs (Olmesartan Medoxomil-Hctz) .Marland Kitchen.. 1 Once Daily 9)  Cpap +8 Cm, With Large Full Face Mask .... As Directed 10)  Co Q-10 200 Mg Caps (Coenzyme Q10) .Marland Kitchen.. 1 Tab By Mouth Once Daily 11)  Multivitamins   Tabs (Multiple Vitamin) .Marland Kitchen.. 1 Tab By Mouth Once Daily 12)  Magnesium 200 Mg Tabs (Magnesium) .Marland Kitchen.. 1 Tab By Mouth Once Daily  Allergies (verified): 1)  ! Floxin 2)  ! Betadine  Past History:  Past Medical History: Last updated: 05/18/2010 MURMUR (ICD-785.2) HYPOTHYROIDISM (ICD-244.9) HYPERLIPIDEMIA (ICD-272.4) ESSENTIAL HYPERTENSION (ICD-401.9) NONSPECIFIC ABNORMAL ELECTROCARDIOGRAM (ICD-794.31) DIVERTICULOSIS, COLON (ICD-562.10) G E R D (ICD-530.81) OBSTRUCTIVE SLEEP APNEA (ICD-780.57)/CENTRAL SLEEP APNEA CONDS CLASSIFIED ELSEWHERE (ICD-327.27) DERMATOPHYTOSIS OF NAIL (ICD-110.1), S/P surgical removal DYSMETABOLIC SYNDROME X (ICD-277.7) GOUT (ICD-274.9), PMH of, Dr Kellie Simmering COLONIC POLYPS, HX OF (ICD-V12.72) Coronary artery disease, S/P  3 vessel CBAG Nephrolithiasis, hx of, 04/2009  Past Surgical History: Last updated: 05/18/2010  Median sternotomy, extracorporeal circulation, coronary bypass graft surgery x3 using a left internal mammary artery graft to left anterior descending coronary artery, with a sequential  saphenous vein graft to posterior descending and posterolateral branches  of the right coronary artery.  Endoscopic vein harvesting from the right   leg.  [name], M.D. 05/29/2009 Arthroscopy right knee X 2;X1 L knee pneumonia at 70  years of  age colonoscopy X3----tics 2003 total  R knee replacement Multiple colonoscopies-polyps resected  repeatedly (due 2014)  ; S/P esophageal dilation X1 , by Dr Kinnie Scales partial L  knee replacement March 2009,[facility]  Family History: Last updated: 05/18/2010 mother-lung cancer, liver cancer, smoker, htn, TIA father-MI @ age 34 brother- MI  @  67  Social History: Last updated: 05/18/2010 former smoker; quit  in 1968,smoked for 6 years  etoh-occasionally ; on  no diet Married with 2 children.  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   Vital Signs:  Patient profile:   70 year old male Height:      72 inches Weight:      290 pounds BMI:     39.47 Pulse rate:   66 / minute Resp:     14 per minute BP sitting:   127 / 70  (left arm)  Vitals Entered By: Kem Parkinson (June 28, 2010 3:32 PM)  Physical Exam  General:  Affect appropriate Healthy:  appears stated age HEENT: normal Neck supple with no adenopathy JVP normal no bruits no thyromegaly Lungs clear with no wheezing and good diaphragmatic motion Heart:  S1/S2 systolic  murmur,rub, gallop or click PMI normal Abdomen: benighn, BS positve, no tenderness, no AAA no bruit.  No HSM or HJR Distal pulses intact with no bruits Trace bilaterla edema Neuro non-focal Skin warm and dry    Impression & Recommendations:  Problem # 1:  CORONARY ARTERY DISEASE (ICD-414.00) CABG 2010 no angina stable His updated medication list for this problem includes:    Metoprolol Tartrate 25 Mg Tabs (Metoprolol tartrate) .Marland Kitchen... 1/2 tab by mouth two times a day    Aspirin Ec 325 Mg Tbec (Aspirin) .Marland Kitchen... Take one tablet by mouth daily  Problem # 2:  MURMUR  (ICD-785.2) AV sclerois consider echo in 2 years His updated medication list for this problem includes:    Metoprolol Tartrate 25 Mg Tabs (Metoprolol tartrate) .Marland Kitchen... 1/2 tab by mouth two times a day    Benicar Hct 20-12.5 Mg Tabs (Olmesartan medoxomil-hctz) .Marland Kitchen... 1 once daily  Problem # 3:  ESSENTIAL HYPERTENSION (ICD-401.9) Improved with diuretic His updated medication list for this problem includes:    Metoprolol Tartrate 25 Mg Tabs (Metoprolol tartrate) .Marland Kitchen... 1/2 tab by mouth two times a day    Aspirin Ec 325 Mg Tbec (Aspirin) .Marland Kitchen... Take one tablet by mouth daily    Benicar Hct 20-12.5 Mg Tabs (Olmesartan medoxomil-hctz) .Marland Kitchen... 1 once daily  Problem # 4:  HYPERLIPIDEMIA (ICD-272.4) Continue statin.  Needs diet Rx as well His updated medication list for this problem includes:    Crestor 20 Mg Tabs (Rosuvastatin calcium) .Marland Kitchen... 1 by mouth once daily  CHOL: 126 (05/18/2010)   LDL: 72 (05/18/2010)   HDL: 40.80 (05/18/2010)   TG: 68.0 (05/18/2010) CHOL (goal): 200 (02/15/2008)  LDL (goal): 130 (02/15/2008)   HDL (goal): 40 (02/15/2008)   TG (goal): 150 (02/15/2008)

## 2010-11-27 NOTE — Letter (Signed)
Summary: [facility]   Imported By: Lanelle Bal 11/23/2009 16:21:11  _____________________________________________________________________  External Attachment:    Type:   Image     Comment:   External Document

## 2010-11-27 NOTE — Assessment & Plan Note (Signed)
Summary: ACUTE FOR SINUS//PH   Vital Signs:  Patient profile:   70 year old male Weight:      267 pounds BMI:     37.37 O2 Sat:      96 % Temp:     98.4 degrees F oral Pulse rate:   60 / minute Resp:     14 per minute BP sitting:   130 / 80  (left arm) Cuff size:   large  Vitals Entered By: Shonna Chock (December 04, 2009 2:07 PM) CC: 1.) Refill Thyroid Medication  2.) Sinus Issuse since last Monday Comments REVIEWED MED LIST, PATIENT AGREED DOSE AND INSTRUCTION CORRECT    Primary Care Provider:  Marga Melnick MD  CC:  1.) Refill Thyroid Medication  2.) Sinus Issuse since last Monday.  History of Present Illness: Onset as ocular pain & NP cough as of 11/27/2009 followed by double sickening with malaise, rattly cough & head congestion . Loose stool X 1. Rx: lozenges , Robitussin Max , Afrin   Allergies: 1)  ! Floxin 2)  ! Betadine  Review of Systems General:  Complains of sweats; denies chills and fever. Eyes:  Denies discharge, red eye, and vision loss-both eyes. ENT:  Complains of nasal congestion and sinus pressure; No frontal headache, facial pain; some purulence. Resp:  Denies chest pain with inspiration, shortness of breath, sputum productive, and wheezing.  Physical Exam  General:  well-nourished,in no acute distress; alert,appropriate and cooperative throughout examination Eyes:  No corneal or conjunctival inflammation noted. EOMI. Perrla.  Vision grossly normal. Ears:  External ear exam shows no significant lesions or deformities.  Otoscopic examination reveals clear canals, tympanic membranes are intact bilaterally without bulging, retraction, inflammation or discharge. Hearing is grossly normal bilaterally. Nose:  External nasal examination shows no deformity or inflammation. Nasal mucosa are pink and moist without lesions or exudates. Mouth:  Oral mucosa and oropharynx without lesions or exudates.   Mild pharyngeal erythema.   Lungs:  Normal respiratory effort,  chest expands symmetrically. Lungs : coarse rhonchi Cervical Nodes:  No lymphadenopathy noted Axillary Nodes:  No palpable lymphadenopathy   Impression & Recommendations:  Problem # 1:  BRONCHITIS-ACUTE (ICD-466.0)  RAD component  His updated medication list for this problem includes:    Amoxicillin-pot Clavulanate 875-125 Mg Tabs (Amoxicillin-pot clavulanate) .Marland Kitchen... 1 q 12 hrs with a meal    Advair Diskus 100-50 Mcg/dose Aepb (Fluticasone-salmeterol) .Marland Kitchen... 1 inhalation every 12hrs ; gargle after use & spit  Problem # 2:  URI (ICD-465.9)  His updated medication list for this problem includes:    Aspirin Ec 325 Mg Tbec (Aspirin) .Marland Kitchen... Take one tablet by mouth daily  Complete Medication List: 1)  Crestor 20 Mg Tabs (Rosuvastatin calcium) .Marland Kitchen.. 1 by mouth once daily 2)  Levothroid 25 Mcg Tabs (Levothyroxine sodium) .Marland Kitchen.. 1 by mouth once daily except 1 and 1/2 tabs tues,thur, saturday 3)  Metoprolol Tartrate 25 Mg Tabs (Metoprolol tartrate) .... 1/2 tab by mouth two times a day 4)  Nexium 40 Mg Cpdr (Esomeprazole magnesium) .Marland Kitchen.. 1 by mouth once daily 5)  Folic Acid 600 Mcg Tabs (folic Acid)  .Marland Kitchen.. 1 tab by mouth once daily 6)  Fish Oil 1200 Mg Caps (Omega-3 fatty acids) .... 2 by mouth once daily 7)  Aspirin Ec 325 Mg Tbec (Aspirin) .... Take one tablet by mouth daily 8)  Benicar 20 Mg Tabs (Olmesartan medoxomil) .... Take one tablet by mouth daily 9)  Cpap +8 Cm, With Large Full Face  Mask  .... As directed 10)  Co Q-10 200 Mg Caps (coenzyme Q10)  .Marland Kitchen.. 1 tab by mouth once daily 11)  Multivitamins Tabs (Multiple vitamin) .Marland Kitchen.. 1 tab by mouth once daily 12)  Amoxicillin-pot Clavulanate 875-125 Mg Tabs (Amoxicillin-pot clavulanate) .Marland Kitchen.. 1 q 12 hrs with a meal 13)  Advair Diskus 100-50 Mcg/dose Aepb (Fluticasone-salmeterol) .Marland Kitchen.. 1 inhalation every 12hrs ; gargle after use & spit  Patient Instructions: 1)  Drink as much fluid as you can tolerate for the next few days. Use samples of Advair as  directed. Prescriptions: LEVOTHROID 25 MCG  TABS (LEVOTHYROXINE SODIUM) 1 by mouth once daily except 1 and 1/2 tabs tues,thur, saturday  #108 x 1   Entered and Authorized by:   Marga Melnick MD   Signed by:   Marga Melnick MD on 12/04/2009   Method used:   Print then Give to Patient   RxID:   [id number] ADVAIR DISKUS 100-50 MCG/DOSE AEPB (FLUTICASONE-SALMETEROL) 1 inhalation every 12hrs ; gargle after use & spit  #1 x 11   Entered and Authorized by:   Marga Melnick MD   Signed by:   Marga Melnick MD on 12/04/2009   Method used:   Print then Give to Patient   RxID:   [id number] AMOXICILLIN-POT CLAVULANATE 875-125 MG TABS (AMOXICILLIN-POT CLAVULANATE) 1 q 12 hrs with a meal  #20 x 0   Entered and Authorized by:   Marga Melnick MD   Signed by:   Marga Melnick MD on 12/04/2009   Method used:   Faxed to ...       [facility]. [phone]* (retail)       [address]       [place], [place]  [address]       Ph: [phone]       Fax: [phone]   RxID:   [id number]

## 2010-11-27 NOTE — Progress Notes (Signed)
  Phone Note Outgoing Call   Call placed by: Scherrie Bateman, LPN,  January 31, 2010 3:32 PM Call placed to: PT'S WIFE Summary of Call: [facility] RE FILLING BENICAR NOT SURE OF RIGHT PHARMACY Initial call taken by: Scherrie Bateman, LPN,  January 31, 2010 3:33 PM  Follow-up for Phone Call        [facility] [phone], request call back, Migdalia Dk  January 31, 2010 4:39 PM     Prescriptions: BENICAR HCT 20-12.5 MG TABS (OLMESARTAN MEDOXOMIL-HCTZ) 1 once daily  #90 x 3   Entered by:   Scherrie Bateman, LPN   Authorized by:   Colon Branch, MD, [facility]   Signed by:   Scherrie Bateman, LPN on 62/13/0865   Method used:   Faxed to ...       [facility] ([facility])       [address] [place], [place]  [address]       Ph: [phone]       Fax: [phone]   RxID:   [id number]   Appended Document:  lmtcb./cy  Appended Document:  PT AWARE MED FILLED THROUGH [facility]./CY

## 2010-11-27 NOTE — Assessment & Plan Note (Signed)
Summary: cpx//pt will be fasting//lch//rsh from bmp//lch   Vital Signs:  Patient profile:   70 year old male Height:      72.75 inches Weight:      287.6 pounds BMI:     38.34 Temp:     97.8 degrees F oral Pulse rate:   56 / minute Resp:     14 per minute BP sitting:   124 / 78  (left arm) Cuff size:   large  Vitals Entered By: Shonna Chock CMA (May 18, 2010 8:24 AM)  CC: CPX with fasting labs , General Medical Evaluation   Primary Care Provider:  Marga Melnick MD  CC:  CPX with fasting labs  and General Medical Evaluation.  History of Present Illness: Steven Munoz is here for a physical( he is still working & not using Medicare);he is asymptomatic. Hyperlipidemia Follow-Up      This is a 70 year old man who presents for Hyperlipidemia follow-up.  The patient denies muscle aches, GI upset, abdominal pain, flushing, itching, constipation, diarrhea, and fatigue.  Other symptoms include  occasional pedal edema.  The patient denies the following symptoms: chest pain/pressure, exercise intolerance, dypsnea, palpitations, and syncope.  Compliance with medications (by patient report) has been near 100%.  Dietary compliance has been fair.  The patient reports exercising 3-4X per week( recumbant bike , weights, treadmill for 4 hrs total).  Adjunctive measures currently used by the patient include ASA, fish oil supplements, and Co-QA.   Hypertension Follow-Up      The patient also presents for Hypertension follow-up.  The patient denies lightheadedness, urinary frequency, headaches, and rash.  Compliance with medications (by patient report) has been near 100%.  Adjunctive measures currently used by the patient include salt restriction.  BP averages 124/68.  Allergies: 1)  ! Floxin 2)  ! 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  Endoscopic vein harvesting from the right   leg.  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 Eyes:  Denies blurring, double vision, and vision loss-both eyes. ENT:  Denies difficulty swallowing and hoarseness. Resp:  Denies excessive snoring, hypersomnolence, and morning headaches. GI:  Denies abdominal  pain, bloody stools, dark tarry stools, and indigestion. GU:  Denies discharge, dysuria, and hematuria. MS:  Denies low back pain, mid back pain, and thoracic pain; Trigger finger L 3rd finger, S/P  injection X 2 by [facility]. Derm:  Denies changes in nail beds, dryness, hair loss, and lesion(s). Neuro:  Denies numbness, tingling, and weakness. Psych:  Denies anxiety and depression. Endo:  Denies cold intolerance, excessive hunger, excessive thirst, excessive urination, and heat intolerance. Heme:  Denies abnormal bruising and bleeding. Allergy:  Denies itching eyes and sneezing.  Physical Exam  General:  well-nourished; alert,appropriate and cooperative throughout examination Head:  Normocephalic and atraumatic without obvious abnormalities. No apparent alopecia  Eyes:  No corneal or conjunctival inflammation noted. Perrla. Funduscopic exam benign, without hemorrhages, exudates or papilledema.Ptosis OD Ears:  External ear exam shows no significant lesions or deformities.  Otoscopic examination reveals clear canals, tympanic membranes are intact bilaterally without bulging, retraction, inflammation or discharge. Hearing is grossly normal bilaterally. Nose:  External nasal examination shows no deformity or inflammation. Nasal mucosa are pink and moist without lesions or exudates. Mouth:  Oral mucosa and oropharynx without lesions or exudates.  Teeth in good repair. Neck:  No deformities, masses, or tenderness noted. Lungs:  Normal respiratory effort, chest expands symmetrically. Lungs are clear to auscultation, no crackles or wheezes. Heart:  regular rhythm, no gallop, no rub, no JVD, no HJR, bradycardia, and grade 1 /6 systolic murmur LUSB.   Abdomen:  Bowel sounds positive,abdomen soft and non-tender without masses, organomegaly or hernias noted. Rectal:  No external abnormalities noted. Normal sphincter tone. No rectal masses or tenderness. Genitalia:  Testes bilaterally descended without nodularity, tenderness or masses. No scrotal masses or lesions. No penis lesions or urethral discharge. L varicocele.   Prostate:  No nodules ; minimal  L asymmetrical enlargement.   Msk:  No  deformity or scoliosis noted of thoracic or lumbar spine.   Pulses:  R and L carotid,radial,dorsalis pedis and posterior tibial pulses are full and equal bilaterally Extremities:  No clubbing, cyanosis; trace edema. DIP OA changes. Crepitus of knees.  Scar tissue @ toenail op sites Neurologic:  alert & oriented X3 and DTRs symmetrical and normal.   Skin:  Intact without suspicious lesions or rashes Cervical Nodes:  No lymphadenopathy noted Axillary Nodes:  No palpable lymphadenopathy Psych:  memory intact for recent and remote, normally interactive, and good eye contact.     Impression & Recommendations:  Problem # 1:  ROUTINE GENERAL MEDICAL EXAM@HEALTH  CARE FACL (ICD-V70.0)  Orders: EKG w/ Interpretation (93000) Venipuncture (82956) TLB-Lipid Panel (80061-LIPID) TLB-BMP (Basic Metabolic Panel-BMET) (80048-METABOL) TLB-CBC Platelet - w/Differential (85025-CBCD) TLB-Hepatic/Liver Function Pnl (80076-HEPATIC) TLB-TSH (Thyroid Stimulating Hormone) (84443-TSH) TLB-PSA (Prostate Specific Antigen) (84153-PSA) TLB-Uric Acid, Blood (84550-URIC)  Problem # 2:  HYPOTHYROIDISM (ICD-244.9)  His updated medication list for this problem includes:    Levothroid 25 Mcg Tabs (Levothyroxine sodium) .Marland Kitchen... 1 by mouth once daily except 1 and 1/2 tabs tues,thur, saturday  Problem # 3:  HYPERLIPIDEMIA (ICD-272.4)  His updated medication list for this problem includes:    Crestor 20 Mg Tabs (Rosuvastatin calcium) .Marland Kitchen... 1 by mouth once daily  Problem # 4:  ESSENTIAL HYPERTENSION (ICD-401.9)  His updated medication list for this problem includes:    Metoprolol Tartrate 25 Mg Tabs (Metoprolol tartrate) .Marland Kitchen... 1/2 tab by mouth two times a day    Benicar Hct 20-12.5 Mg Tabs (Olmesartan medoxomil-hctz) .Marland Kitchen... 1 once daily  Problem # 5:  CORONARY ARTERY DISEASE (ICD-414.00) as  per Dr Eden Emms His updated medication list for this problem includes:    Metoprolol Tartrate 25 Mg Tabs (Metoprolol tartrate)  .Marland Kitchen... 1/2 tab by mouth two times a day    Aspirin Ec 325 Mg Tbec (Aspirin) .Marland Kitchen... Take one tablet by mouth daily    Benicar Hct 20-12.5 Mg Tabs (Olmesartan medoxomil-hctz) .Marland Kitchen... 1 once daily  Problem # 6:  COLONIC POLYPS, HX OF (ICD-V12.72) as per Dr Kinnie Scales  Problem # 7:  GOUT (ICD-274.9)  Quiescent  Orders: TLB-Uric Acid, Blood (84550-URIC)  Complete Medication List: 1)  Crestor 20 Mg Tabs (Rosuvastatin calcium) .Marland Kitchen.. 1 by mouth once daily 2)  Levothroid 25 Mcg Tabs (Levothyroxine sodium) .Marland Kitchen.. 1 by mouth once daily except 1 and 1/2 tabs tues,thur, saturday 3)  Metoprolol Tartrate 25 Mg Tabs (Metoprolol tartrate) .... 1/2 tab by mouth two times a day 4)  Nexium 40 Mg Cpdr (Esomeprazole magnesium) .Marland Kitchen.. 1 by mouth once daily 5)  Folic Acid 600 Mcg Tabs (folic Acid)  .Marland Kitchen.. 1 tab by mouth once daily 6)  Fish Oil 1200 Mg Caps (Omega-3 fatty acids) .... 2 by mouth once daily 7)  Aspirin Ec 325 Mg Tbec (Aspirin) .... Take one tablet by mouth daily 8)  Benicar Hct 20-12.5 Mg Tabs (Olmesartan medoxomil-hctz) .Marland Kitchen.. 1 once daily 9)  Cpap +8 Cm, With Large Full Face Mask  .... As directed 10)  Co Q-10 200 Mg Caps (coenzyme Q10)  .Marland Kitchen.. 1 tab by mouth once daily 11)  Multivitamins Tabs (Multiple vitamin) .Marland Kitchen.. 1 tab by mouth once daily 12)  Fish Oil Oil (Fish oil) .... 2 tabs by mouth once daily  Patient Instructions: 1)  Correct record & share with all MDs seen Prescriptions: LEVOTHROID 25 MCG  TABS (LEVOTHYROXINE SODIUM) 1 by mouth once daily except 1 and 1/2 tabs tues,thur, saturday  #108 x 1   Entered and Authorized by:   Marga Melnick MD   Signed by:   Marga Melnick MD on 05/18/2010   Method used:   Print then Give to Patient   RxID:   [id number]     Appended Document: cpx//pt will be fasting//lch//rsh from bmp//lch    Clinical Lists Changes  Orders: Added new Service order of Specimen Handling (28413) - Signed      Appended Document: cpx//pt will be fasting//lch//rsh  from bmp//lch   Immunizations Administered:  Tetanus Vaccine:    Vaccine Type: Tdap    Site: right deltoid    Mfr: GlaxoSmithKline    Dose: 0.5 ml    Route: IM    Given by: Shonna Chock CMA    Exp. Date: 01/20/2012    Lot #: [id number]    VIS given: 09/15/07 version given May 18, 2010.   Laboratory Results   Urine Tests    Routine Urinalysis   Color: straw Appearance: Clear Glucose: negative   (Normal Range: Negative) Bilirubin: negative   (Normal Range: Negative) Ketone: negative   (Normal Range: Negative) Spec. Gravity: 1.010   (Normal Range: 1.003-1.035) Blood: negative   (Normal Range: Negative) pH: 6.0   (Normal Range: 5.0-8.0) Protein: negative   (Normal Range: Negative) Urobilinogen: 0.2   (Normal Range: 0-1) Nitrite: negative   (Normal Range: Negative) Leukocyte Esterace: negative   (Normal Range: Negative)

## 2010-11-29 NOTE — Progress Notes (Signed)
Summary: add any more orders to labs??  lm 1/24--made appt  Phone Note Call from Patient Call back at Home Phone [phone]   Caller: Patient Summary of Call: Patient called to ask if he was due for labs to check his crestor and his thyroid---I see order on July 2011 labs for (uric acid , A1c; 790.6, 277.7).----do you want to add TSH and any thing else??  will need orders and codes too     thanks Initial call taken by: Jerolyn Shin,  November 19, 2010 10:33 AM  Follow-up for Phone Call        Patient had full lab panel in 04/2010, TSH was normal with no indication to recheck. Level due to be rechecked 1 year from last check 04/2010 (same goes for chlosterol).  If patient would like to schedule CPX for July 2012 all labs can be checked at that time Follow-up by: Shonna Chock CMA,  November 19, 2010 3:15 PM  Additional Follow-up for Phone Call Additional follow up Details #1::        left message with male for him to call back--we can schedule labs for uric acid, a1c  790.6, 277.7 that were due in Nov 2011, but everything else is OK---does he want to set his CPX now that will be due in July 2012??---last CPX = May 19, 2011.Jerolyn Shin  November 20, 2010 10:52 AM    Additional Follow-up for Phone Call Additional follow up Details #2::    Patient made appt for labs missed in Nov 2011  (uric acid, a1c  790.6, 277.7) for Thursday 1/26 at 8:40am Follow-up by: Jerolyn Shin,  November 21, 2010 8:55 AM

## 2010-11-29 NOTE — Medication Information (Signed)
Summary: Formulary Letter Regarding Nexium/[facility]  Formulary Letter Regarding Nexium/[facility]   Imported By: Lanelle Bal 10/31/2010 09:33:54  _____________________________________________________________________  External Attachment:    Type:   Image     Comment:   External Document

## 2010-11-29 NOTE — Progress Notes (Signed)
Summary: Levothyroid refill  Phone Note Refill Request Message from:  Patient on November 19, 2010 10:27 AM  Refills Requested: Medication #1:  LEVOTHROID 25 MCG  TABS 1 by mouth once daily except 1 and 1/2 tabs tues [facility]---please call it in for him this time---he does not want to pick it up   Next Appointment Scheduled: none Initial call taken by: Jerolyn Shin,  November 19, 2010 10:28 AM    Prescriptions: LEVOTHROID 25 MCG  TABS (LEVOTHYROXINE SODIUM) 1 by mouth once daily except 1 and 1/2 tabs tues,thur, saturday  #108 x 1   Entered by:   Shonna Chock CMA   Authorized by:   Marga Melnick MD   Signed by:   Shonna Chock CMA on 11/19/2010   Method used:   Faxed to ...       [facility] (mail-order)       [address]       [place], [place]  [address]  [place]       Ph:        Fax: [phone]   RxID:   [id number]   Appended Document: Levothyroid refill Prescriptions: LEVOTHROID 25 MCG  TABS (LEVOTHYROXINE SODIUM) 1 by mouth once daily except 1 and 1/2 tabs tues,thur, saturday  #108 x 1   Entered by:   Shonna Chock CMA   Authorized by:   Marga Melnick MD   Signed by:   Shonna Chock CMA on 11/19/2010   Method used:   Faxed to ...       [facility] ([facility]       [address] [place], [place]  [address]       Ph: [phone]       Fax: [phone]   RxID:   [id number]

## 2010-11-29 NOTE — Progress Notes (Signed)
Summary: Nexium alternate   Phone Note Other Incoming   Summary of Call: The office received fax from [facility] stating that the patient could save $ by switching from Nexium to Omeprazole, Generic Zegerid, Pantoprazole, or Lansoprazole. Lm with spouse for patient to call back to office. Initial call taken by: Lucious Groves CMA,  October 17, 2010 3:20 PM  Follow-up for Phone Call        I spoke with the patient and he has not taken any of the generic alternatives. I spoke with the patient about the prior authorization process and that may be the road his insurance company is headed down. He understands the process and will try the Omeprazole, prescription was sent to [facility] per request. Lucious Groves CMA  October 17, 2010 4:42 PM   Additional Follow-up for Phone Call Additional follow up Details #1::        Note-patient does not use [facility] so it was removed. Lucious Groves CMA  October 17, 2010 4:43 PM     New/Updated Medications: OMEPRAZOLE 20 MG CPDR (OMEPRAZOLE) 1 by mouth qd Prescriptions: OMEPRAZOLE 20 MG CPDR (OMEPRAZOLE) 1 by mouth qd  #90 x 1   Entered by:   Lucious Groves CMA   Authorized by:   Marga Melnick MD   Signed by:   Lucious Groves CMA on 10/17/2010   Method used:   Faxed to ...       [facility] ([facility])       [address] [place], [place]  [address]       Ph: [phone]       Fax: [phone]   RxID:   [id number]

## 2011-01-11 ENCOUNTER — Encounter: Payer: Self-pay | Admitting: Cardiovascular Disease

## 2011-01-25 ENCOUNTER — Encounter: Payer: Self-pay | Admitting: Cardiovascular Disease

## 2011-01-25 ENCOUNTER — Ambulatory Visit (INDEPENDENT_AMBULATORY_CARE_PROVIDER_SITE_OTHER): Payer: Self-pay | Admitting: Cardiovascular Disease

## 2011-01-25 DIAGNOSIS — I2510 Atherosclerotic heart disease of native coronary artery without angina pectoris: Secondary | ICD-10-CM

## 2011-01-25 DIAGNOSIS — E785 Hyperlipidemia, unspecified: Secondary | ICD-10-CM

## 2011-01-25 DIAGNOSIS — I10 Essential (primary) hypertension: Secondary | ICD-10-CM

## 2011-01-25 DIAGNOSIS — Z951 Presence of aortocoronary bypass graft: Secondary | ICD-10-CM

## 2011-01-25 NOTE — Assessment & Plan Note (Addendum)
S/P CABG 8/10  No angina.  Discussed low carb diet He is exercising.

## 2011-01-25 NOTE — Assessment & Plan Note (Signed)
Lab Results  Component Value Date   LDLCALC 72 05/18/2010   AT target with no side effects

## 2011-01-25 NOTE — Progress Notes (Signed)
Steven Munoz is S/P CABG with periop afib in 05/2009 .  He has recovered well.  He has good LV function. He finished caardiac rehab.   His wounds have healed well.  He was D/C on amiodarone and has maintained NSR.  He is not on coumadin.  There has been no SSCP, palpitations, PND, orthopne, and only trace edema   The amiodarone is now stopped.  He is compliant with his meds for BP and cholesterol.  He is on amoxacillin now per Dr Alwyn Ren for sinus infection.  His BP is borderline and he had LE edema. HCTZ was added to his meds in March.  He has had improvement in his BP and edema.  He has to stick with a low carb diet as his weight is up again  ROS: Denies fever, malais, weight loss, blurry vision, decreased visual acuity, cough, sputum, SOB, hemoptysis, pleuritic pain, palpitaitons, heartburn, abdominal pain, melena, lower extremity edema, claudication, or rash.   General: Affect appropriate Healthy:  appears stated age HEENT: normal Neck supple with no adenopathy JVP normal no bruits no thyromegaly Lungs clear with no wheezing and good diaphragmatic motion Heart:  S1/S2 no murmur,rub, gallop or click PMI normal Abdomen: benighn, BS positve, no tenderness, no AAA no bruit.  No HSM or HJR Distal pulses intact with no bruits No edema Neuro non-focal Skin warm and dry No muscular weakness   Current Outpatient Prescriptions  Medication Sig Dispense Refill  . aspirin 325 MG tablet Take 325 mg by mouth daily.        . Coenzyme Q10 (COQ10) 100 MG CAPS Take by mouth. 1 po daily       . esomeprazole (NEXIUM) 40 MG capsule Take 40 mg by mouth daily before breakfast.        . folic acid (FOLVITE) 1 MG tablet Take 1 mg by mouth daily.        Marland Kitchen levothyroxine (SYNTHROID, LEVOTHROID) 25 MCG tablet 25 mcg. 1 po daily except 1 and 1/2 tab tues,thurs,sat       . Magnesium 200 MG TABS Take by mouth. 1 po daily       . metoprolol tartrate (LOPRESSOR) 25 MG tablet 25 mg. 1/2 po bid       . Multiple Vitamin  (MULTIVITAMIN) tablet Take 1 tablet by mouth daily.        Marland Kitchen olmesartan-hydrochlorothiazide (BENICAR HCT) 20-12.5 MG per tablet Take 1 tablet by mouth daily.        . Omega-3 Fatty Acids (FISH OIL) 1200 MG CAPS Take by mouth. 1 po daily       . omeprazole (PRILOSEC) 20 MG capsule Take 20 mg by mouth daily.        . rosuvastatin (CRESTOR) 20 MG tablet Take 20 mg by mouth daily.          Allergies  Ofloxacin and Povidone-iodine  Electrocardiogram:  Assessment and Plan

## 2011-01-25 NOTE — Patient Instructions (Signed)
Follow-up with Dr. Tiwanna Tuch in a year.  

## 2011-01-25 NOTE — Assessment & Plan Note (Signed)
Well controlled.  Continue current medications and low sodium Dash type diet.    

## 2011-01-25 NOTE — Assessment & Plan Note (Signed)
S/P CABG 8/10  No angina.  Continue medical Rx

## 2011-02-01 LAB — GLUCOSE, CAPILLARY
Glucose-Capillary: 101 mg/dL — ABNORMAL HIGH (ref 70–99)
Glucose-Capillary: 87 mg/dL (ref 70–99)
Glucose-Capillary: 94 mg/dL (ref 70–99)

## 2011-02-02 LAB — GLUCOSE, CAPILLARY
Glucose-Capillary: 100 mg/dL — ABNORMAL HIGH (ref 70–99)
Glucose-Capillary: 108 mg/dL — ABNORMAL HIGH (ref 70–99)
Glucose-Capillary: 112 mg/dL — ABNORMAL HIGH (ref 70–99)
Glucose-Capillary: 118 mg/dL — ABNORMAL HIGH (ref 70–99)
Glucose-Capillary: 124 mg/dL — ABNORMAL HIGH (ref 70–99)
Glucose-Capillary: 129 mg/dL — ABNORMAL HIGH (ref 70–99)
Glucose-Capillary: 133 mg/dL — ABNORMAL HIGH (ref 70–99)
Glucose-Capillary: 139 mg/dL — ABNORMAL HIGH (ref 70–99)
Glucose-Capillary: 81 mg/dL (ref 70–99)
Glucose-Capillary: 95 mg/dL (ref 70–99)

## 2011-02-02 LAB — POCT I-STAT 3, ART BLOOD GAS (G3+)
Acid-Base Excess: 2 mmol/L (ref 0.0–2.0)
Bicarbonate: 24.3 mEq/L — ABNORMAL HIGH (ref 20.0–24.0)
Bicarbonate: 24.3 mEq/L — ABNORMAL HIGH (ref 20.0–24.0)
O2 Saturation: 99 %
Patient temperature: 35.5
Patient temperature: 36.8
pCO2 arterial: 35 mmHg (ref 35.0–45.0)
pH, Arterial: 7.442 (ref 7.350–7.450)
pO2, Arterial: 85 mmHg (ref 80.0–100.0)

## 2011-02-02 LAB — HEMOGLOBIN A1C: Mean Plasma Glucose: 117 mg/dL

## 2011-02-02 LAB — COMPREHENSIVE METABOLIC PANEL
AST: 20 U/L (ref 0–37)
Albumin: 3.6 g/dL (ref 3.5–5.2)
BUN: 11 mg/dL (ref 6–23)
Creatinine, Ser: 0.85 mg/dL (ref 0.4–1.5)
GFR calc Af Amer: >60 mL/min (ref >60)
Total Protein: 6.2 g/dL (ref 6.0–8.3)

## 2011-02-02 LAB — BASIC METABOLIC PANEL
BUN: 20 mg/dL (ref 6–23)
CO2: 25 mEq/L (ref 19–32)
CO2: 27 mEq/L (ref 19–32)
CO2: 28 mEq/L (ref 19–32)
CO2: 28 mEq/L (ref 19–32)
Calcium: 8 mg/dL — ABNORMAL LOW (ref 8.4–10.5)
Calcium: 8 mg/dL — ABNORMAL LOW (ref 8.4–10.5)
Calcium: 9.2 mg/dL (ref 8.4–10.5)
Chloride: 105 mEq/L (ref 96–112)
Chloride: 108 mEq/L (ref 96–112)
Creatinine, Ser: 0.8 mg/dL (ref 0.4–1.5)
Creatinine, Ser: 0.82 mg/dL (ref 0.4–1.5)
GFR calc Af Amer: >60 mL/min (ref >60)
GFR calc Af Amer: >60 mL/min (ref >60)
GFR calc Af Amer: >60 mL/min (ref >60)
GFR calc non Af Amer: >60 mL/min (ref >60)
Glucose, Bld: 129 mg/dL — ABNORMAL HIGH (ref 70–99)
Glucose, Bld: 129 mg/dL — ABNORMAL HIGH (ref 70–99)
Glucose, Bld: 132 mg/dL — ABNORMAL HIGH (ref 70–99)
Glucose, Bld: 139 mg/dL — ABNORMAL HIGH (ref 70–99)
Potassium: 3.7 mEq/L (ref 3.5–5.1)
Potassium: 4.5 mEq/L (ref 3.5–5.1)
Sodium: 138 mEq/L (ref 135–145)
Sodium: 140 mEq/L (ref 135–145)

## 2011-02-02 LAB — CBC
HCT: 27.8 % — ABNORMAL LOW (ref 39.0–52.0)
HCT: 28.1 % — ABNORMAL LOW (ref 39.0–52.0)
HCT: 33.3 % — ABNORMAL LOW (ref 39.0–52.0)
Hemoglobin: 9.3 g/dL — ABNORMAL LOW (ref 13.0–17.0)
Hemoglobin: 9.6 g/dL — ABNORMAL LOW (ref 13.0–17.0)
MCHC: 34.4 g/dL (ref 30.0–36.0)
MCHC: 35.1 g/dL (ref 30.0–36.0)
MCV: 93.8 fL (ref 78.0–100.0)
MCV: 94.8 fL (ref 78.0–100.0)
MCV: 95.8 fL (ref 78.0–100.0)
Platelets: 119 10*3/uL — ABNORMAL LOW (ref 150–400)
Platelets: 130 10*3/uL — ABNORMAL LOW (ref 150–400)
RBC: 2.85 MIL/uL — ABNORMAL LOW (ref 4.22–5.81)
RBC: 2.93 MIL/uL — ABNORMAL LOW (ref 4.22–5.81)
RBC: 2.93 MIL/uL — ABNORMAL LOW (ref 4.22–5.81)
RDW: 13 % (ref 11.5–15.5)
RDW: 13.1 % (ref 11.5–15.5)
RDW: 13.4 % (ref 11.5–15.5)
WBC: 11.8 10*3/uL — ABNORMAL HIGH (ref 4.0–10.5)
WBC: 8.7 10*3/uL (ref 4.0–10.5)
WBC: 8.9 10*3/uL (ref 4.0–10.5)

## 2011-02-02 LAB — POCT I-STAT 4, (NA,K, GLUC, HGB,HCT)
Glucose, Bld: 109 mg/dL — ABNORMAL HIGH (ref 70–99)
Glucose, Bld: 115 mg/dL — ABNORMAL HIGH (ref 70–99)
Glucose, Bld: 116 mg/dL — ABNORMAL HIGH (ref 70–99)
HCT: 24 % — ABNORMAL LOW (ref 39.0–52.0)
HCT: 30 % — ABNORMAL LOW (ref 39.0–52.0)
Hemoglobin: 10.2 g/dL — ABNORMAL LOW (ref 13.0–17.0)
Hemoglobin: 8.2 g/dL — ABNORMAL LOW (ref 13.0–17.0)
Hemoglobin: 8.8 g/dL — ABNORMAL LOW (ref 13.0–17.0)
Potassium: 3.5 mEq/L (ref 3.5–5.1)
Potassium: 3.6 mEq/L (ref 3.5–5.1)
Potassium: 3.6 mEq/L (ref 3.5–5.1)
Potassium: 3.6 mEq/L (ref 3.5–5.1)
Potassium: 4.2 mEq/L (ref 3.5–5.1)
Sodium: 141 mEq/L (ref 135–145)

## 2011-02-02 LAB — CROSSMATCH: Antibody Screen: NEGATIVE

## 2011-02-02 LAB — APTT: aPTT: 34 seconds (ref 24–37)

## 2011-02-02 LAB — LIPID PANEL
Cholesterol: 102 mg/dL (ref 0–200)
HDL: 34 mg/dL — ABNORMAL LOW (ref >39)
LDL Cholesterol: 48 mg/dL (ref 0–99)
Triglycerides: 99 mg/dL (ref <150)

## 2011-02-02 LAB — MAGNESIUM
Magnesium: 2.5 mg/dL (ref 1.5–2.5)
Magnesium: 2.7 mg/dL — ABNORMAL HIGH (ref 1.5–2.5)

## 2011-02-02 LAB — CREATININE, SERUM
GFR calc Af Amer: >60 mL/min (ref >60)
GFR calc non Af Amer: >60 mL/min (ref >60)

## 2011-02-02 LAB — BLOOD GAS, ARTERIAL
Acid-Base Excess: 2.2 mmol/L — ABNORMAL HIGH (ref 0.0–2.0)
FIO2: 0.21 %
pCO2 arterial: 38.1 mmHg (ref 35.0–45.0)

## 2011-02-02 LAB — POCT I-STAT, CHEM 8
BUN: 11 mg/dL (ref 6–23)
Calcium, Ion: 1.14 mmol/L (ref 1.12–1.32)
Chloride: 106 mEq/L (ref 96–112)
Glucose, Bld: 150 mg/dL — ABNORMAL HIGH (ref 70–99)
HCT: 27 % — ABNORMAL LOW (ref 39.0–52.0)
TCO2: 22 mmol/L (ref 0–100)

## 2011-02-02 LAB — PREPARE PLATELETS

## 2011-02-02 LAB — ABO/RH: ABO/RH(D): B POS

## 2011-02-02 LAB — PROTIME-INR: INR: 1.4 (ref 0.00–1.49)

## 2011-02-03 LAB — CBC
HCT: 41.3 % (ref 39.0–52.0)
HCT: 46.4 % (ref 39.0–52.0)
Hemoglobin: 16.1 g/dL (ref 13.0–17.0)
MCHC: 34.1 g/dL (ref 30.0–36.0)
MCHC: 34.7 g/dL (ref 30.0–36.0)
MCV: 93.9 fL (ref 78.0–100.0)
Platelets: 145 10*3/uL — ABNORMAL LOW (ref 150–400)
Platelets: 159 10*3/uL (ref 150–400)
RDW: 13.2 % (ref 11.5–15.5)
RDW: 13.3 % (ref 11.5–15.5)

## 2011-02-03 LAB — URINALYSIS, ROUTINE W REFLEX MICROSCOPIC
Bilirubin Urine: NEGATIVE
Glucose, UA: NEGATIVE mg/dL
Ketones, ur: NEGATIVE mg/dL
Protein, ur: NEGATIVE mg/dL

## 2011-02-03 LAB — BASIC METABOLIC PANEL
BUN: 14 mg/dL (ref 6–23)
BUN: 19 mg/dL (ref 6–23)
CO2: 28 mEq/L (ref 19–32)
Calcium: 9.6 mg/dL (ref 8.4–10.5)
Chloride: 110 mEq/L (ref 96–112)
Glucose, Bld: 108 mg/dL — ABNORMAL HIGH (ref 70–99)
Glucose, Bld: 175 mg/dL — ABNORMAL HIGH (ref 70–99)
Potassium: 4 mEq/L (ref 3.5–5.1)
Sodium: 140 mEq/L (ref 135–145)

## 2011-02-03 LAB — POCT I-STAT 4, (NA,K, GLUC, HGB,HCT)
Glucose, Bld: 107 mg/dL — ABNORMAL HIGH (ref 70–99)
HCT: 45 % (ref 39.0–52.0)
Sodium: 141 mEq/L (ref 135–145)

## 2011-02-03 LAB — URINE MICROSCOPIC-ADD ON

## 2011-03-01 ENCOUNTER — Ambulatory Visit (INDEPENDENT_AMBULATORY_CARE_PROVIDER_SITE_OTHER): Payer: BC Managed Care – PPO | Admitting: Internal Medicine

## 2011-03-01 ENCOUNTER — Encounter: Payer: Self-pay | Admitting: Internal Medicine

## 2011-03-01 DIAGNOSIS — E669 Obesity, unspecified: Secondary | ICD-10-CM

## 2011-03-01 DIAGNOSIS — E039 Hypothyroidism, unspecified: Secondary | ICD-10-CM

## 2011-03-01 DIAGNOSIS — I2510 Atherosclerotic heart disease of native coronary artery without angina pectoris: Secondary | ICD-10-CM

## 2011-03-01 NOTE — Progress Notes (Signed)
Addended by: Doristine Devoid on: 03/01/2011 04:53 PM   Modules accepted: Orders

## 2011-03-01 NOTE — Patient Instructions (Signed)
You should consume less than 40 grams  of sugar per day from foods and drinks with high fructose corn syrup as number 2, 3, or #4 on the label. Consider The New Sugar [employer] low carb program.

## 2011-03-01 NOTE — Progress Notes (Signed)
  Subjective:    Patient ID: Steven Munoz, male    DOB: 09-18-1941, 70 y.o.   MRN: [id number]  HPI  He seeks advice on maintaining weight loss.Constitutional: weight change : 12 # loss in past 4 weeks with low carb; significant fatigue:no ; sleep pattern: good with CPAP; anorexia:no. Eyes: vision change(blurred/diplopia/loss): no. ENT/Mouth: hoarseness: no; swallowing issues: no.  CV: palpitations: no; racing: no ; irregularity:no. GI: constipation: no; diarrhea: no. Derm: change in nails/ skin/hair: no. Neuro: numbness /tingling:no; tremor:no.Psych: anxiety: no; depression: no. Endo: temperature intolerance:heat: no ; cold : no.      Review of Systems     Objective:   Physical Exam Weight excess; in no acute distress. Extraocular motions intact; he has no lid lag. Thyroid is normal to palpation.The DTRs are  normal. No tremor. There is  onycholysis of the nailbeds. His heart rhythm is regular; there is a low-grade systolic murmur.Psych:  Cognition and judgment appear intact. Alert, communicative  and cooperative with normal attention span and concentration.      Assessment & Plan:

## 2011-03-12 NOTE — Assessment & Plan Note (Signed)
OFFICE VISIT   MATHESON, VANDEHEI  DOB:  [birth year]                                        June 29, 2009  CHART #:  [id number]   The patient comes to the office today at the request of Cardiac Rehab  yesterday when one of the nurses or PAs mentioned to him his right lower  leg appeared a little erythematous and told him he should have it  checked.  He comes to the office today, feeling well.  He notes that  several days ago, he did think the lower portion of his right leg around  the ankle was slightly erythematous and possibly warm.  He notes that  over the past 2 days, it is actually much improved and is not bothering  him today.  He has had no fever or chills and otherwise, he has been  making good progress postoperatively.   On exam, his blood pressure 140/74, pulse 62, respiratory rate is 18, O2  sat is 96%, and temperature is 97.7.  His sternum is stable and well  healed.  Lungs are clear.  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  The leg does not appear to be actively  infected.  I have reviewed with the patient, his signs and symptoms  should he be developing an early cellulitis.  At this point, I have not  started him on antibiotics, but would have a low threshold to do so if  the leg does not continue to improve.  He is aware to call immediately  should he have increasing temperature, erythema, or swelling.  Otherwise, we will see him back in the office next week to check on the  status of his leg.   Sheliah Plane, MD  Electronically Signed   EG/MEDQ  D:  06/29/2009  T:  06/30/2009  Job:  [id number]

## 2011-03-12 NOTE — Consult Note (Signed)
NAME:  Steven Munoz, Steven Munoz NO.:  [id number]   MEDICAL RECORD NO.:  [id number]          PATIENT TYPE:  INP   LOCATION:  2914                         FACILITY:  [facility]   PHYSICIAN:  [name], M.D.     DATE OF BIRTH:  [birth year]   DATE OF CONSULTATION:  05/26/2009  DATE OF DISCHARGE:                                 CONSULTATION   REFERRING PHYSICIAN:  Noralyn Pick. Eden Emms, MD, [facility]   REASON FOR CONSULTATION:  Severe two-vessel coronary disease.   CLINICAL HISTORY:  I was asked by Dr. Eden Emms to evaluate Steven Munoz for  consideration of coronary artery bypass graft surgery.  He is a 70-year-  old gentleman with history of hypertension and hyperlipidemia as well as  a strong family history of heart disease who says that he has been  asymptomatic as far as his heart goes.  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  He said that he saw Dr. Alwyn Ren in June for his routine physical and an  electrocardiogram was abnormal.  He subsequently underwent a stress  Myoview exam, which was markedly abnormal.  He had marked EKG changes  with T-wave inversions in the inferolateral leads and possible LV cavity  dilatation.  He had anterior apical ischemia.  He underwent cardiac  catheterization yesterday, which showed severe two-vessel disease with  95%-99% ostial LAD stenosis as well as high-grade distal right coronary  artery and ostial PDA stenosis.  The left circumflex had no significant  disease.  Left ventricular function was normal.   REVIEW OF SYSTEMS:  As follows.  GENERAL:  He denies any fever or  chills.  He has had some weight gain over the past year and said that  his weight fluctuates.  He said he has been over 300 pounds in the past,  but got his weight down to about 265 and now is back up to about 280.  He denies any fatigue and said that he walks on a treadmill several days  per week and rides a recumbent bicycle  without any difficulty.  EYES:  Negative.  ENT:  Negative.  ENDOCRINE:  He denies diabetes.  He does  have hypothyroidism.  CARDIOVASCULAR:  As above.  He denies any chest  pain or pressure.  He denies exertional dyspnea.  He has had no PND or  orthopnea.  He denies fatigue.  He has had frequent palpitations, which  have been longstanding and also has some lower extremity edema.  RESPIRATORY:  He denies cough or sputum production.  GI:  He has had no  nausea or vomiting.  Denies melena and bright red blood per rectum.  GU:  He denies dysuria and hematuria.  MUSCULOSKELETAL:  He has had arthritis  of both knees and has undergone right total knee replacement and left  partial knee replacement.  NEUROLOGICAL:  Denies any focal weakness or  numbness.  He denies dizziness and syncope.  He has never had TIA or  stroke.  HEMATOLOGICAL:  Negative.  PSYCHIATRIC:  Negative.  ALLERGIES:  BETADINE and FLOXINS.   PAST MEDICAL HISTORY:  Significant for a long history of PVCs.  He has a  history of hypertension and hyperlipidemia.  He has a history of  hypothyroidism.  He has history of heart murmur in the past.  He has a  history of obstructive sleep apnea.  He has a history of gout.  He has a  history of diverticulosis.  He has a history of colon polyps.   SURGICAL HISTORY:  He is status post left total knee replacement and  right partial knee replacement in the past.  He has undergone multiple  arthroscopies on his knees prior to that.   FAMILY HISTORY:  His father died of myocardial infarction at age 36 and  brother died of myocardial infarction at age 24.  This is her first  myocardial infarction.  His mother had lung cancer and liver cancer.   SOCIAL HISTORY:  He is married with 2 children.  His son is a Education officer, community in  [place].  He has a daughter who lives at home.  His wife is here  today.  He works in Airline pilot.  He smoked for 6 years, quit in 1968.  Drinks  occasional alcohol.  He is not on  any specific diet.   Medications are as noted on his admission MAR.   PHYSICAL EXAMINATION:  VITAL SIGNS:  Blood pressure is 131/64, pulse is  74 with frequent PVCs, respiratory rate is 16 unlabored.  GENERAL:  He is an obese white male in no distress.  HEENT:  Normocephalic and atraumatic.  Pupils are equal and reactive to  light and accommodation.  Extraocular muscles are intact.  Throat is  clear.  NECK:  Normal carotid pulses bilaterally.  There are no bruits.  There  is no adenopathy or thyromegaly.  CARDIAC:  Regular rate and rhythm with normal S1 and S2.  There is no  murmur, rub, or gallop.  LUNGS:  Clear.  ABDOMEN:  Active bowel sounds.  Abdomen is soft, obese, and nontender.  There are no palpable masses or organomegaly.  EXTREMITIES:  No peripheral edema.  There are scars over both knees from  knee replacement.  Pedal pulses are strongly palpable bilaterally.  SKIN:  Warm and dry.  NEUROLOGIC:  Alert and oriented x3.  Motor and sensory exams are grossly  normal.   ADMISSION LABORATORY EXAMINATION:  Hemoglobin of 16.1, white blood cell  count 6.1, platelet count of 109,000.  Coagulation profile was normal.  Electrolytes were normal with a BUN of 14, creatinine of 0.85.   IMPRESSION:  Steven Munoz has severe two-vessel coronary artery disease  with markedly abnormal stress test and essentially no symptoms.  I agree  that coronary bypass graft surgery is the best treatment for this  patient.  I discussed the operative procedure with the patient and his  wife including alternatives, benefits, and risks including, but not  limited to bleeding, blood transfusion, infection, stroke, myocardial  infarction, graft failure, and death.  Also discussed the importance of  maximum cardiac risk factor reduction.  They understand and agree to  proceed.  We will plan this on Monday, May 29, 2009.      [name], M.D.  Electronically Signed     BB/MEDQ  D:  05/26/2009  T:   05/27/2009  Job:  [id number]   cc:   Titus Dubin. Alwyn Ren, MD,FACP,FCCP  Noralyn Pick. Eden Emms, MD, [facility]

## 2011-03-12 NOTE — Assessment & Plan Note (Signed)
OFFICE VISIT   Steven Munoz, Steven Munoz  DOB:  09/19/1941                                        June 08, 2009  CHART #:  [id number]   The patient underwent coronary artery bypass grafting x3 by Dr. Laneta Simmers  and was discharged home last week.  He underwent coronary artery bypass  grafting on May 29, 2009.  He had been doing well at home until early  this week, when he noted increasing tenderness, warmth, and swelling  over the inner aspect of his right elbow.  He watches at home and then  called the office and came in to have it checked.  He notes now, it is  much improved, and his erythema is almost completely disappeared and the  tenderness is much improved.  He has had no fever or chills at home, and  otherwise is progressing well after his bypass surgery.   PHYSICAL EXAMINATION:  Vital Signs:  His blood pressure 160/79, pulse  72, respiratory rate is 18, O2 sats 98% on room air.  His temperature is  97.4.  His sternum is stable and well healed.  Lungs:  Clear  bilaterally.  He has some very faint induration in the inner aspect of  his right elbow, slight edema but he notes this is markedly improved  from when he first noted symptoms earlier this week.  He did have a IV  puncture site in the antecubital fossa and appears to have been loaded  with amiodarone.  I suspect he may have developed a chemical phlebitis  at the IV site, though I cannot confirm which IV sites amiodarone was  administered.  However on exam, it seems to be improved and the patient  notes it has been improved.  I have discussed the diagnosis with him,  and we will continue with just local care with warm soaks and careful  observation.  Since it is improving on its own, I will not start p.o.  antibiotics.  However, I have explained to the patient, should it flare  up again or have any increasing tenderness that we would  start antibiotics, and he will notify us immediately.  Otherwise,  he  will keep his appointment to see Dr. Laneta Simmers in 2 weeks.   Sheliah Plane, MD  Electronically Signed   EG/MEDQ  D:  06/08/2009  T:  06/09/2009  Job:  [id number]   cc:   Noralyn Pick. Eden Emms, MD, [facility]

## 2011-03-12 NOTE — Cardiovascular Report (Signed)
NAME:  Steven Munoz, SUHR NO.:  [id number]   MEDICAL RECORD NO.:  [id number]          PATIENT TYPE:  INP   LOCATION:  2506                         FACILITY:  [facility]   PHYSICIAN:  Veverly Fells. Excell Seltzer, MD  DATE OF BIRTH:  01/23/1941   DATE OF PROCEDURE:  05/25/2009  DATE OF DISCHARGE:                            CARDIAC CATHETERIZATION   PROCEDURES:  Left heart catheterization, selective coronary angiography,  left ventricular angiography, left subclavian angiography.   PROCEDURAL INDICATIONS:  [name] is a 70 year old gentleman with  exertional dyspnea and multiple cardiac risk factors.  He underwent a  Myoview stress scan in the office that had high-risk features.  There  was LV dilatation with stress as well as multiple perfusion defects.  The patient was referred directly to cardiac cath lab.   PROCEDURAL DETAILS:  Risks and indications of procedure were reviewed  with the patient.  Informed consent was obtained.  The right groin was  prepped, draped, and anesthetized with 1% lidocaine.  Using modified  Seldinger technique, a 5-French sheath was placed in the right femoral  artery.  Standard 5-French Judkins catheters were used for coronary  angiography, left ventriculography, and left subclavian angiography.  The subclavian artery was imaged with the JR-4 catheter.  The patient  tolerated procedure well.  There were no immediate complications.  All  catheter exchanges were performed over guidewire.   PROCEDURAL FINDINGS:  Hemodynamics:  Left ventricular pressure 177/16,  aortic pressure 176/88 with a mean of 124.   Coronary angiography:  Left mainstem:  The left mainstem is long.  There is diffuse stenosis  with associated calcification, most significant in the distal left main.  There is 30-40% distal left main stenosis.   LAD:  The LAD is a large caliber vessel with some degree of ectasia.  There is moderate calcification throughout the proximal and mid  LAD.  There is a complex plaque involving the ostium of the LAD with eccentric  95% stenosis followed by diffuse 70% stenosis before the first diagonal.  The first diagonal branch is very small and diffusely diseased.  The mid  LAD is ectatic.  The second diagonal branch is moderate-sized with no  significant stenosis present.  Just beyond the second diagonal, the mid-  LAD has a 50% calcified stenosis.  The remaining portions of the mid and  distal LAD have serial plaque, but no areas of high-grade stenosis.   Left circumflex:  The left circumflex has 30-40% ostial stenosis with  associated calcification.  The AV groove circumflex is small without  significant disease.  There are two large OM branches without  significant stenosis.   Right coronary artery:  The right coronary artery is dominant.  There is  mild nonobstructive disease in the proximal and mid vessel.  There is a  severe focal stenosis of the distal vessel estimated at 75-80%.  This is  just before the bifurcation of the PDA and posterior AV segment.  The  ostium of the PDA also has a 75% stenosis.  The posterior AV segment and  posterolateral branches have no significant disease.  The  PDA is  diffusely diseased.   Left ventriculography:  Left ventricular function is normal.  The LVEF  is 55%.  There is no mitral regurgitation present.   Left subclavian artery:  The left subclavian artery appears mildly  dilated.  There is no significant stenosis throughout the subclavian.  The LIMA is patent and appears appropriate for vascular conduit.   FINAL ASSESSMENT:  1. Critical ostial left anterior descending stenosis.  2. Severe right coronary artery stenosis.  3. Nonobstructive left circumflex stenosis.  4. Preserved left ventricular function.  5. Patent left subclavian artery and left internal mammary artery.   RECOMMENDATIONS:  Mr. Odor has severe two-vessel coronary artery  disease with preserved LV function.  His  anatomy is somewhat unfavorable  for percutaneous intervention with heavy calcification of the ostial LAD  in the presence of mild distal left main disease.  I think his long-term  outcome will be better with coronary bypass surgery.  The patient will  be admitted to the hospital and started on intravenous heparin.  A  consultation was placed with Cardiac Surgery.  The case was discussed  with Dr. Eden Emms.      Veverly Fells. Excell Seltzer, MD  Electronically Signed     MDC/MEDQ  D:  05/25/2009  T:  05/26/2009  Job:  [id number]   cc:   Noralyn Pick. Eden Emms, MD, [facility]

## 2011-03-12 NOTE — Op Note (Signed)
NAME:  Steven Munoz, Steven Munoz NO.:  [id number]   MEDICAL RECORD NO.:  [id number]          PATIENT TYPE:  INP   LOCATION:  2303                         FACILITY:  [facility]   PHYSICIAN:  [name], M.D.     DATE OF BIRTH:  1941/03/01   DATE OF PROCEDURE:  05/29/2009  DATE OF DISCHARGE:                               OPERATIVE REPORT   PREOPERATIVE DIAGNOSIS:  Severe two-vessel coronary disease.   POSTOPERATIVE DIAGNOSIS:  Severe two-vessel coronary disease.   OPERATIVE PROCEDURES:  Median sternotomy, extracorporeal circulation,  coronary bypass graft surgery x3 using a left internal mammary artery  graft to left anterior descending coronary artery, with a sequential  saphenous vein graft to posterior descending and posterolateral branches  of the right coronary artery.  Endoscopic vein harvesting from the right  leg.   ATTENDING SURGEON:  [name], MD   ASSISTANT:  Doree Fudge, [facility]   ANESTHESIA:  General endotracheal.   CLINICAL HISTORY:  This patient is a 70 year old obese man with  hyperlipidemia and hypertension as well as a family history of heart  disease who underwent an EKG as part of his normal yearly checkup.  This  was abnormal and prompted a stress test which was markedly abnormal.  He  subsequently underwent cardiac catheterization which showed severe two-  vessel disease with 95-99% ostial LAD stenosis as well as a high-grade  distal right coronary artery stenosis just before the takeoff of the  posterior descending branch and then a high-grade ostial posterior  descending stenosis.  The posterolateral branches were relatively small.  Left circumflex had no significant disease.  Left ventricular function  was well preserved.  After review of the catheterization and examination  of the patient, it was felt that coronary artery bypass graft surgery  was the best treatment to prevent further ischemia and infarction.  I  discussed the operative  procedure with the patient and his wife.  We  discussed alternatives, benefits, and risks including but not limited to  bleeding, blood transfusion, infection, stroke, myocardial infarction,  graft failure, and death.  He understood and agreed to proceed.   OPERATIVE PROCEDURE:  The patient was taken to the operating room and  placed on the table in supine position.  After induction of general  endotracheal anesthesia, a Foley catheter was placed in the bladder  using sterile technique.  Then the chest, abdomen, and both lower  extremities were prepped and draped in the usual sterile manner.  The  chest was entered through a median sternotomy incision.  The pericardium  opened in the midline.  Examination of the heart showed good ventricular  contractility.  The ascending aorta was of normal size and had no  palpable plaques in it.  It was relatively short, probably due to his  obesity.  There was not a large amount of epicardial fat.   Then the left internal mammary artery was harvested from the chest wall  as a pedicle graft.  This was a medium caliber vessel with excellent  blood flow through it.  At the same time, a segment of greater saphenous  vein was harvested from the left leg using endoscopic vein harvest  technique.  This vein was of medium size and good quality.  We initially  examined saphenous vein adjacent to the left knee but this vein was felt  to be small and not ideal.  Therefore, it was not harvested.   Then the patient was heparinized and when an adequate activated clotting  time was achieved, the distal ascending aorta was cannulated using a 20-  [place] aortic cannula for arterial inflow.  Venous outflow was achieved  using a two-stage venous cannula through the right atrial appendage.  An  antegrade cardioplegia and vent cannula was inserted in the aortic root.   The patient was placed on cardiopulmonary bypass and distal coronaries  were identified.  The LAD was  a large graftable vessel.  There was some  segmental plaque present in the mid portion.  The right coronary artery  was diffusely diseased.  It gave off a moderate-sized posterior  descending branch which was actually intramyocardial along its entire  extent.  This was located in its midportion where it was suitable for  grafting but had some segmental disease in it.  The distal right  coronary artery was just beyond the takeoff of the posterior descending  branch where it was soft enough to graft.  This supplied the two small  posterolateral branches.   Then the aorta was crossclamped and 1000 mL of cold blood antegrade  cardioplegia was administered in the aortic root with quick arrest of  the heart.  Systemic hypothermia to 32 degrees centigrade and topical  hypothermic iced saline was used.  A temperature probe was placed in the  septum and insulating pad in the pericardium.   The first distal anastomosis was then performed to the distal right  coronary artery just beyond takeoff of the posterior descending branch.  The internal diameter here was greater than 2 mm.  The conduit used was  a segment of greater saphenous vein and the anastomosis performed in a  sequential side-to-side manner using continuous 7-0 Prolene suture.  Flow was noted through the graft and was excellent.   Second distal anastomosis was performed to the midportion of the  posterior descending coronary artery.  The internal diameter here was  about 1.6 mm.  The conduit used was the same segment of greater  saphenous vein and the anastomosis performed in a sequential end-to-side  manner using continuous 7-0 Prolene suture.  Flow was noted through the  graft and was excellent.  Then another dose of cardioplegia was given  down the vein graft and the aortic root.   The third distal anastomosis was performed to the distal LAD.  The  internal diameter of this vessel was about 2 mm.  A 2-mm probe passed  easily  back up into the proximal LAD through all the segmental plaques.  The conduit used was the left internal mammary artery.  This was brought  through an opening in the left pericardium anterior to the phrenic  nerve.  It was anastomosed to the LAD in an end-to-side manner using  continuous 8-0 Prolene suture.  The pedicle was sutured to the  epicardium with 6-0 Prolene sutures to prevent rotation.  Then the  patient was rewarmed to 37 degrees centigrade.  Another dose of  cardioplegia was given and a single proximal vein graft anastomosis was  performed to the aortic root in an end-to-side manner using continuous 6-  0 Prolene suture.  Then the  clamp was removed from the mammary pedicle.  There was rapid warming of the ventricular septum and return of  spontaneous ventricular fibrillation.  The crossclamp was removed at a  time of 57 minutes.  The patient was defibrillated into sinus rhythm.  The proximal and distal anastomoses appeared hemostatic and all of the  grafts satisfactory.  Graft marker was placed around the proximal  anastomoses.  Two temporary right ventricular and right atrial pacing  wires were placed and brought out through the skin.   When the patient rewarmed to 37 degrees centigrade, he was weaned from  cardiopulmonary bypass on no inotropic agents.  Total bypass time was 74  minutes.  Cardiac function appeared excellent.  Cardiac output of 5.5  L/min.  Protamine was given and venous and aortic cannulas were removed  without difficulty.  Hemostasis was achieved.  The patient was given 1  unit of platelets due to platelet count of 103,000.  Three chest tubes  were placed with two in the posterior pericardium, one in the anterior  mediastinum, and one in the left pleural space.  The pericardium was  then loosely reapproximated in the heart.  The sternum was closed with  #6 double stainless steel wires.  The fascia was closed with continuous  #1 Vicryl suture.  Subcutaneous  tissue was closed with continuous 2-0  Vicryl and the skin with a 3-0 Vicryl subcuticular closure.  Lower  extremity vein harvest site was closed in layers in a similar  manner.  Sponge, needle, and instrument counts were correct according to  the scrub nurse.  Dry sterile dressing was applied over the incisions  around chest tubes which were Pleur-Evac suction.  The patient remained  hemodynamically stable and was transferred to the SICU in guarded but  stable condition.      [name], M.D.  Electronically Signed     BB/MEDQ  D:  05/29/2009  T:  05/30/2009  Job:  [id number]   cc:   [facility]

## 2011-03-12 NOTE — Procedures (Signed)
NAME:  Steven Munoz, HARMON NO.:  [id number]   MEDICAL RECORD NO.:  [id number]          PATIENT TYPE:  OUT   LOCATION:  SLEEP CENTER                 FACILITY:  [facility]   PHYSICIAN:  Oretha Milch, MD      DATE OF BIRTH:  05-24-1941   DATE OF STUDY:  09/05/2008                            NOCTURNAL POLYSOMNOGRAM   REFERRING PHYSICIAN:   INDICATIONS FOR THE STUDY:  Mr. Mohr is a 70 year old gentleman with  loud snoring, witnessed apneas, and excessive daytime somnolence.  At  the time of the study he weighed 278 pounds with a height of 6 feet and  1inch, BMI of 37, and neck size of 16 inches.   EPWORTH SLEEPINESS SCORE:  16/24.   This overnight polysomnogram was performed with a split-study with the  sleep technologist in attendance.  EEG, EOG, EMG, EKG, and respiratory  parameters were recorded.  Sleep studies with arousal limb movements and  respiratory data were scored according to the criteria laid out by the  American Academy of Sleep Medicine.   SLEEP ARCHITECTURE:  Lights out was at 10 p.m., lights on was at 5:26  a.m.  During the baseline portion, 120 minutes of sleep were recorded  with a sleep period time of 130 minutes and sleep latency was 48.5%  minutes.  Sleep maintenance efficiency was 73%.  Sleep stages as a  percentage of total sleep time was N1 10.4%, N2 84.2%, N3 0% and REM  sleep 5.4% (6.5 minutes). He spent 6 minutes in supine sleep.   Large periods of REM were noted during the CPAP titration portion with  about 60 minutes of REM sleep in early a.m.   AROUSAL DATA:  During the baseline portion, 112 arousals were noted with  an arousal index of 56 events per hour, of these 46 were spontaneous, 53  were the respiratory events, and 13 were associated with periodic limb  movements.   RESPIRATORY DATA:  During the diagnostic portion there were 11  obstructive apneas and 52 hypopneas leading to an apnea-hypopnea index  of 31.5 events per hour, 36  RERA were noted with an RDI of 49.5 events  per hour and that he only spent 6 minutes in supine position.  The  longest hypopnea duration was 66 seconds.   Due to this degree of respiratory disturbance, CPAP was initiated at 4  cm and titrated to 8 cm due to snoring and respiratory events.  CPAP was  6 cm for 78 minutes of sleep including 33.5 minutes of REM sleep.  Four  hypopneas were noted and 13 arousals with a lowest desaturation of 87%,  and CPAP of 8 cm for 30.5 minutes of sleep and 23 minutes of REM sleep.  No events were noted and 8 arousals were noted with a lowest  desaturation of 91%.   LIMB MOVEMENT DATA:  PLM index was 28 events per hour during the  diagnostic portion with PLM arousal index was only 6.4 events per hour.  These seem to disappear during the CPAP titration portion.   CARDIAC DATA:  The lowest heart rate was 36 beats per minute during non-  REM  sleep and 39 beats per minute during REM sleep.  The high heart rate  noted was an artifact.  Quite a few PVCs were noted.   DISCUSSION:  Moderate obstructive sleep apnea noted in spite of mostly  nonsupine sleep.  During the diagnostic portion quite a few PLMs which  seemed to disappear with CPAP.  He seemed to tolerate the CPAP well.  A  large-sized ResMed mirage Quattro mask was used for desensitization with  heated humidity.   IMPRESSION:  1. Moderate-to-severe obstructive sleep apnea with predominant      hypopneas causing sleep fragmentation and oxygen desaturation.  2. This was corrected by continuous positive airway pressure of 8 cm      with a large-sized mask  3. Periodic limb movements which seemed to disappear during continuous      positive airway pressure during the titration portion.  4. Frequent premature ventricular contractions were observed.   RECOMMENDATIONS:  Initiate CPAP at 8 cm with a large full-face mask.  He  should be cautioned against driving when sleepy and asked to avoid  medications  with sedative side effects.      Oretha Milch, MD  Electronically Signed     RVA/MEDQ  D:  09/07/2008 13:23:29  T:  09/08/2008 03:39:39  Job:  [id number]

## 2011-03-12 NOTE — Discharge Summary (Signed)
NAME:  Steven, Munoz NO.:  [id number]   MEDICAL RECORD NO.:  [id number]          PATIENT TYPE:  INP   LOCATION:  2014                         FACILITY:  [facility]   PHYSICIAN:  Steven Munoz, M.D.     DATE OF BIRTH:  02/20/1941   DATE OF ADMISSION:  05/25/2009  DATE OF DISCHARGE:                               DISCHARGE SUMMARY   PRIMARY ADMITTING DIAGNOSIS:  Chest pain.   ADDITIONAL/DISCHARGE DIAGNOSES:  1. Coronary artery disease.  2. Hypertension.  3. Hyperlipidemia.  4. History of frequent premature ventricular contractions.  5. Hypothyroidism.  6. Obstructive sleep apnea.  7. Gout.  8. Diverticulosis.  9. Colon polyps.  10.Postoperative atrial fibrillation.  11.Remote history of tobacco abuse.  12.Postoperative acute blood loss anemia.   PROCEDURES PERFORMED:  1. Cardiac catheterization.  2. Coronary artery bypass grafting x3 (left internal mammary artery to      the LAD, sequential saphenous vein graft to the posterior      descending and posterolateral coronaries).  3. Endoscopic vein harvest, right thigh.   HISTORY:  The patient is a 70 year old male who saw his primary care  physician, Dr. Alwyn Munoz, back in June 2010 for routine history and  physical.  At that time, an EKG was performed which was abnormal.  He  subsequently underwent a stress Myoview exam which was also markedly  abnormal.  He had marked EKG changes with T-wave inversions in the  inferolateral leads with possible LV cavity dilatation.  He had anterior  apical ischemia.  Because of these findings, he was brought in for  cardiac catheterization on May 25, 2009, for further evaluation of his  coronary anatomy.  Catheterization was performed by Dr. Excell Munoz, and he  was found to have severe two-vessel coronary artery disease with a 95-  99% ostial LAD stenosis as well as a high-grade distal right coronary  artery and ostial PDA stenosis.  The left circumflex had no significant  disease.   Left ventricular function was normal.  Because of these  findings, the patient was subsequently admitted for cardiac surgery  consult for consideration of revascularization.   HOSPITAL COURSE:  [name] was admitted following his catheterization.  He was seen by Dr. Evelene Munoz in [facility] and films were reviewed.  Dr.  Laneta Munoz felt that he would benefit from surgical revascularization at  this time.  He explained all risks, benefits, and alternatives of  surgery to the patient, and he agreed to proceed.  Postcatheterization,  he developed bleeding from the right groin catheterization site which  required transfer to the ICU for observation of the groin hematoma.  He  had no further problems with bleeding from the area after pressure was  applied to the area, and he was treated with fluid resuscitation.  He  otherwise remained stable prior to his surgery.  His preoperative workup  included carotid Doppler study showing no ICA stenosis bilaterally and  ABIs which were greater than 1.0 bilaterally.  He was taken to the  operating room on May 29, 2009, and underwent CABG x3 as described  above.  Please see  previously dictated operative report for complete  details of surgery.  He tolerated the procedure well and was transferred  to the SICU in stable condition.  He was able to be extubated shortly  after surgery.  He was hemodynamically stable and doing well on postop  day 1.  At that time, his hemodynamic monitoring lines and chest tubes  were removed.  He was able to be transferred to the step-down unit.  He  had a brief episode of atrial fibrillation on postop day 2 and was  started on IV amiodarone as well as digoxin.  He converted to normal  sinus rhythm and was switched to p.o. amnio.  Also, his heart rate  slowed down into the 60s, and it was felt that his digoxin should be  discontinued.  He has remained stable since that time with no further  arrhythmias.  He has had occasional PVCs  which he had prior to surgery.  He is tolerating beta-blocker as well as the amiodarone without problem.  He has done well otherwise.  He has been mildly volume overloaded  postoperatively and has been diuresed down to approximately 1 kg above  his preoperative weight.  He only has mild lower extremity edema on  physical exam at this point.  Also, he is ambulating well with cardiac  rehab phase I and is making good progress.  He is tolerating a regular  diet and having normal bowel and bladder function.  He has been  restarted on his home dose of statin as well as continued on his home  thyroid medication.  It is felt if he continues to progress well over  the next 24 hours and no acute changes occur, he will be hopefully ready  for discharge home on June 03, 2009.   DISCHARGE MEDICATIONS:  Please see discharge medication manager in e-  chart.   DISCHARGE INSTRUCTIONS:  He is asked to refrain from driving, heavy  lifting, or strenuous activity.  He may continue ambulating daily and  using his incentive spirometer.  He may shower daily and clean his  incisions with soap and water.  He will continue his same preoperative  diet.   DISCHARGE FOLLOWUP:  He will need to make an appointment to see Dr.  Eden Munoz in 2 weeks for followup.  He will then see Dr. Laneta Munoz in 3 weeks  with a chest x-ray.  In the interim, if he experiences any problems or  has questions, he is asked to contact our office immediately.      Coral Ceo, P.A.      Steven Munoz, M.D.  Electronically Signed    GC/MEDQ  D:  06/02/2009  T:  06/03/2009  Job:  [id number]   cc:   Steven Pick. Steven Emms, MD, [facility]  Steven Fells. Steven Seltzer, MD  Steven Dubin. Steven Ren, MD,FACP,FCCP

## 2011-03-12 NOTE — Assessment & Plan Note (Signed)
OFFICE VISIT   Steven Munoz, [name]  DOB:  January 03, 1941                                        June 20, 2009  CHART #:  [id number]   The patient returns today for follow up status post coronary artery  bypass graft surgery x3 on 05/29/2009.  He says he feels great and is  walking 30 minutes per day without chest pain or shortness of breath.  He is anxious being in cardiac rehab.   PHYSICAL EXAMINATION:  Vital Signs:  His blood pressure is 138/70, pulse  63 and regular.  His respiratory rate is 18 and unlabored.  Oxygen  saturation on room air is 96%.  General:  He looks well.  Cardiac:  Regular rate and rhythm with normal heart sounds.  Lung:  Clear.  The  chest incision is healing well and the sternum is stable.  Extremities:  Both leg incisions are healing well and there is no peripheral edema.   Followup chest x-ray today shows trivial left pleural effusion blunting  the costophrenic angle.   His medications were reviewed on his medication list.  The only  significant change since discharge is that his amiodarone was decreased  to 200 mg b.i.d.   IMPRESSION:  Overall, the patient is recovering well following his  surgery.  He had some postoperative atrial fibrillation but appears to  be maintaining sinus rhythm.  Cardiology will decide when to get him off  of amiodarone.  I encouraged him to continue walking as much as  possible.  I told him not to lift anything heavier than 10 pounds for a  total of 3 months from date of surgery.  I told him that he can return  to driving.  He will continue to follow up with Cardiology and will  contact me if he develops any problems with his incisions.   [name], M.D.  Electronically Signed   BB/MEDQ  D:  06/20/2009  T:  06/20/2009  Job:  [id number]

## 2011-03-12 NOTE — Assessment & Plan Note (Signed)
OFFICE VISIT   Steven Munoz, Steven Munoz  DOB:  29-Sep-1941                                        July 04, 2009  CHART #:  [id number]   HISTORY:  The patient returns today for re-examination of his leg  incisions.  He saw Dr. Tyrone Sage and one of the physician assistants on  June 29, 2009, due to some redness around his right lower leg  saphenous vein harvest incision.  Dr. Tyrone Sage thought that it looked  okay and he was not started on any antibiotics.  The patient said that  his leg incisions have continued to improve.  He has had no fever or  chills and otherwise feels well.   PHYSICAL EXAMINATION:  Today, the leg incisions are intact without any  sign of infection.  There is no tenderness.   IMPRESSION:  I think his leg incisions are doing well.  There is no sign  of infection.  He will continue with cardiac rehab and will return to  see me as needed.   [name], M.D.  Electronically Signed   BB/MEDQ  D:  07/04/2009  T:  07/05/2009  Job:  [id number]

## 2011-03-12 NOTE — Op Note (Signed)
NAME:  Steven Munoz, Steven Munoz NO.:  [id number]   MEDICAL RECORD NO.:  [id number]          PATIENT TYPE:  AMB   LOCATION:  [facility]                         FACILITY:  [facility]   PHYSICIAN:  Heloise Purpura, MD      DATE OF BIRTH:  January 19, 1941   DATE OF PROCEDURE:  04/27/2009  DATE OF DISCHARGE:                               OPERATIVE REPORT   PREOPERATIVE DIAGNOSIS:  Left ureteral calculus.   POSTOPERATIVE DIAGNOSIS:  Left ureteral calculus.   PROCEDURE:  1. Cystoscopy.  2. Left retrograde pyelography.  3. Left ureteroscopy with laser lithotripsy and stone removal.   SURGEON:  Heloise Purpura, MD.   ASSISTANT:  Dr. Edward Qualia.   ANESTHESIA:  General.   COMPLICATIONS:  None.   INDICATIONS:  [name] is a 70 year old gentleman who recently  developed left-sided flank pain and underwent a CT scan which  demonstrated a 4 mm left ureteral calculus.  He was given a trial of  spontaneous passage and was unable to pass the stone.  We therefore  discussed definitive surgical treatment options and he did elect to  proceed with the above procedures.  The potential risk, complications  and alternative options were discussed in detail and informed consent  was obtained.   DESCRIPTION OF PROCEDURE:  The patient was taken to the operating room  and LMA was placed.  He was placed in the dorsal lithotomy position,  prepped and draped in the usual sterile fashion, and was administered  preoperative antibiotics.  A preoperative time-out was performed.  Cystourethroscopy was then performed.  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  Using the 22-French cystoscope sheath,  cystourethroscopy was performed which demonstrated no other  abnormalities of the urethra.  Inspection of the bladder revealed no  obvious bladder tumors, stones, or other mucosal abnormalities.  Attention was turned to the left  ureteral orifice and this was intubated  with a 6-French ureteral catheter.  Omnipaque contrast was injected and  did appear to demonstrate a filling defect in the distal ureter just  below the level of the iliac vessels.  A 8.038 Sensor guidewire was  advanced up passed the stone into the left renal pelvis under  fluoroscopic guidance.  The 6-French ureteroscope was then advanced next  to the wire up into the left ureter without the need for ureteral  dilation.  The stone was visualized and using the 365 micron fiber,  holmium laser lithotripsy was performed to fragment the stone.  These  stone fragments were then extracted utilizing a zero tip basket.  Once  all stone fragments were removed re-inspection of the ureter with  ureteroscopy revealed no remaining  fragments.  The stone fragments were extracted for analysis and the  patient's bladder was emptied.  He tolerated the procedure well and  without complications.  He was able to be awakened and transferred to  the recovery unit in satisfactory condition.      Heloise Purpura, MD  Electronically Signed     LB/MEDQ  D:  04/27/2009  T:  04/27/2009  Job:  [id number]

## 2011-03-15 NOTE — Op Note (Signed)
[facility]  Patient:    Steven Munoz, Steven Munoz Visit Number: [id number] MRN: [id number]          Service Type: SUR Location: 4W [id number] [id number] Attending Physician:  Erasmo Leventhal Dictated by:   Elvera Lennox Valma Cava, M.D. Proc. Date: 11/09/01 Admit Date:  11/09/2001                             Operative Report  PREOPERATIVE DIAGNOSIS:  Right knee end-stage osteoarthritis.  POSTOPERATIVE DIAGNOSIS:  Right knee end-stage osteoarthritis.  PROCEDURE:  Right total knee arthroplasty.  SURGEON:  R. Valma Cava, M.D.  ASSISTANT:  Ralene Bathe, P.A.  ANESTHESIA:  General epinephrine.  ESTIMATED BLOOD LOSS:  Less than 50 cc.  DRAINS:  Two medium Hemovacs.  COMPLICATIONS:  None.  TOURNIQUET TIME:  1 hour and 45 minutes at 400 mmHg.  DISPOSITION:  To PACU stable.  OPERATIVE IMPLANTS:  Osteonics components all cemented, posterior stabilized. Size 11 femur, size 11 tibia, 12 mm polyethylene insert, 30 mm all polyethylene patella.  DESCRIPTION OF PROCEDURE:  The patient was counselled in the holding area, correct size identified, chart reviewed, IV started, antibiotics were given. Epidural catheter was placed.  The patient was taken to the OR, placed in supine position, general anesthesia, again preoperative antibiotics had been given. A Foley catheter was placed utilizing sterile technique by the OR circulating nurse. The right knee was examined, three degree flexion contracture approximately 110 degrees. Elevated, prepped with Duraprep and all ______  draped to a sterile fashion. There was a question of a history of a Betadine allergy in the past, however, he states I have used Betadine in the office and we used it with the previous surgery without any difficulty. Therefore Duraprep was utilized. He was draped in a sterile fashion, exsanguinated with Esmarch, tourniquet was inflated to 400 mmHg due to the large size of his thigh.  A straight  midline incision was made through the skin and subcutaneous tissue. Small bleeders were electrocoagulated. Medial and lateral soft tissue flaps are developed. Medial parapatellar arthrotomy was performed in the proximal medial tibia, soft tissue release was done secondary to his varus knee. The knee was then flexed, end-stage osteoarthritic changes. Also covered with synovitis and a synovectomy was performed. The cruciates ligaments were resected. Starting holes were made in the distal femur, canal was irrigated, the intermedullary rod was gently placed and chose a 5 degree valgus cut with a 10 mm cut off the distal femur. The distal femur was found to be a size #11, rotational marks were made and distal femur was cut to fit a size #11. Medial and lateral menisci removed and geniculate vessels were coagulated. The tibial eminence was resected, osteophytes removed from the proximal tibia found to be a size 11. A starting hole was made, step reamer was utilized, the canal was irrigated until the effluent was clear. The intermedullary rod was gently placed. Using a 5 degree posterior slope, set the rotation correctly and took a 10 mm cut basically on the lateral side. There was some eburnation on the medial bone, drill holes were made the upper cement interdigitation.  Posteromedial and posterolateral femoral osteophytes removed under direct visualization. The femoral trochlea was prepared in standard fashion.  At this time with a size 11 femur, size 11 tibia with a 12 mm polyethylene insert with excellent range of motion, soft tissue balance and flexion extension and alignment. Rotation marks made in  the proximal tibia and the delta keel was performed in the standard fashion.  The patella was found to be a size #30. It was of appropriate thickness, it was reamed to a depth of 10 mm, locking holes were made and excess bone was removed.  At this point in time utilizing modern cement  technique, all components were cemented in place. Size 11 tibia, size 11 femur with a 30 mm patella. Excess cement was removed after the cement had cured. At this point in time, the 12 mm trial insert with excellent range of motion and soft tissue balance. The Telfa retractor was lateral and lateral release performed. Geniculates were taken care of. The patella was tracking anatomic at this point. The knee was then flexed, tibial trial removed, excess cement was removed. The knee was irrigated copiously, hemostasis was obtained and a final 12 mm tibial insert was placed.  Bone wax was then placed on the exposed bony surfaces.  Two medium Hemovac drains were placed. Sequential closure of layers were done, synovium Vicryl, arthrotomy Vicryl, subcu Vicryl, and skin closed with subcuticular monocryl suture. Benzoin and Steri-Strips were applied and drains later hooked to suction. At this point in time utilizing sterile alcohol, the remaining duraprep was removed from the patients lower extremity and a sterile dressing had also been applied to the knee. The tourniquet was deflated, he had a normal pulse at the foot and ankle at the end of the case. Ace wraps, knee immobilizer and ice packs were applied. He was also given another gram of Ancef for intervention, his tourniquet deflated. There were no complications. Sponge and needle count were correct. He was awakened, extubated and taken from operating room to PACU in stable condition. No complications. Dictated by:   R. Valma Cava, M.D. Attending Physician:  Erasmo Leventhal DD:  11/09/01 TD:  11/10/01 Job: [id number]

## 2011-03-15 NOTE — Discharge Summary (Signed)
[facility]  Patient:    Steven Munoz, Steven Munoz Visit Number: [id number] MRN: [id number]          Service Type: SUR Location: [id number] Attending Physician:  Erasmo Leventhal Dictated by:   Dorie Rank, P.A.-C. Admit Date:  11/09/2001 Discharge Date: 11/12/2001                             Discharge Summary  ADMITTING DIAGNOSES: 1. Osteoarthritis of the right knee. 2. Hypertension. 3. History of diverticulitis.  DISCHARGE DIAGNOSES: 1. Status post right total knee arthroplasty. 2. Hypertension. 3. History of diverticulitis.  OPERATIONS:  On November 09, 2001, patient underwent a right total knee arthroplasty.  Surgeon was Dr. Benny Lennert.  Assistant was [employer], P.A.-C.  Anesthesia was general followed by epidural.  COMPLICATIONS:  None.  CONSULTS:  None.  HISTORY OF PRESENT ILLNESS:  Mr. Laneve is a pleasant 70 year old now who has had ongoing right knee pain.  He had a radiograph in the office which revealed a varus knee with bone-on-bone contact, with end-stage osteoarthritis.  He has tried multiple conservative treatment regimens including activity modification and nonsteroidals, all of which have failed.  His pain is to a point where it is interfering with his activities of daily living.  It was felt due to physical exam, diagnostic studies, and interference with his ADLs, he would benefit from undergoing a right total knee arthroplasty.  Risks and benefits as well as the procedure were discussed with the patient and he agreed to proceed.  HOSPITAL COURSE:  Patient had the above-stated surgery without complication. While in the operating room, two Hemovac drains were placed.  These were discontinued on postoperative day #1 without difficulty.  He did have a postoperative epidural in place for pain control.  This controlled his pain well and was discontinued on postoperative day #2.  The pharmacy monitored and dosed his  Coumadin for DVT prophylaxis.  This was started on November 10, 2001. Hemoglobin and hematocrit were checked daily for three days and found to be stable.  BMET, other than an elevated glucose, was found to be stable on postoperative day #1.  Patient worked with physical therapy and occupational therapy for total knee protocol.  He progressed well with his physical therapy.  After his epidural was discontinued, he utilized p.o. Percocet which did control his pain well.  IV antibiotics - Ancef, were used postoperatively for infection control.  His home medications were resumed.  He utilized Facilities manager throughout his hospital stay.  Foley catheter was placed while in the operating room.  This was discontinued on postoperative day #2. He was voiding well throughout the remainder of his hospital stay. Plexipulses were also placed to bilateral feet for DVT prophylaxis.  He utilized the CPM for approximately 8 hours a day while in the hospital. Knee-high TED hose were used, as well, for DVT prophylaxis.  He utilized a knee immobilizer to his right knee when he was walking.  He was weightbearing as tolerated to the right lower extremity.  Two liters of oxygen were used for the first 24 hours.  He did have good O2 saturation on room air.  While in the operating room, the incision was dressed in a sterile fashion.  The dressing was clean, dry, and intact.  On postoperative day #1, the dressing was changed, on postoperative day #2, and daily thereafter.  He was found to be free of any  erythema or drainage.  On postoperative day #3, he was felt to be medically and orthopedically stable for discharge.  Discharge planning worked with the patient to arrange home health, physical therapy, and durable medical equipment needs.  LAB VALUES:  Preoperative H&H on November 02, 2001, within normal limits.  On November 10, 2001, hemoglobin 12.4, hematocrit 35.6.  On November 11, 2001, hemoglobin 12.2,  hematocrit 34.5.  On November 12, 2001, hemoglobin 11.2, hematocrit 32.3.  Coagulation studies on November 02, 2001, were within normal limits.  On November 11, 2001, while on Coumadin therapy, PT was 15.3, INR 1.3. On November 12, 2001, PT 18.6, INR 1.8.  BMET on November 02, 2001, within normal limits.  On November 10, 2001, BMET revealed a glucose of 131, calcium 8.3, otherwise within normal limits.  Liver function studies on November 02, 2001, normal other than a TBIL of 1.9.  On November 02, 2001, UA negative.  Blood type from November 09, 2001, B positive.  Radiology:  Two-view chest x-ray from November 02, 2001, revealed probable bronchitis, no evidence of active infiltrate, consolidation, pleural effusion, or congestive heart failure.  Right knee x-rays from November 02, 2001, revealed severe degenerative hypertrophic arthritic changes particularly in the region of the medial right knee joint.  Cardiology:  No tracings on the chart at the date of this dictation.  CONDITION ON DISCHARGE:  Improved.  DISCHARGE PLANS AND MEDICATIONS:  Patient was discharge home.  Home health, physical therapy, occupational therapy, and Coumadin therapy with [facility] health care.  Activity - weightbearing as tolerated to the right lower extremity.  Daily dressing changes.  Shower postoperative day #5.  Given prescriptions for Robaxin, Percocet, Colace, and Coumadin per pharmacy. Follow up with Dr. Thomasena Edis two weeks from surgery date.  Tylenol for fever. Resume his home medications of Norvasc, Lipitor, Lopressor, and Zyrtec. Low-sodium diet.  Call should he have any questions or concerns prior to his appointment. Dictated by:   Dorie Rank, P.A.-C. Attending Physician:  Erasmo Leventhal DD:  11/18/01 TD:  11/19/01 Job: [id number]

## 2011-03-15 NOTE — H&P (Signed)
[facility]  Patient:    Steven Munoz, Steven Munoz Visit Number: [id number] MRN: [id number]          Service Type: Attending:  R. Valma Cava, M.D. Dictated by:   Dorie Rank, P.A. Adm. Date:  11/09/01                           History and Physical  DATE OF BIRTH:  03/17/1941  CHIEF COMPLAINT:  Right knee pain.  HISTORY OF PRESENT ILLNESS:  Steven Munoz is a 70 year old male who has had an ongoing history of right knee pain.  He had radiographs in the office on October 08, 2001, which demonstrated a varus knee with bone on bone contact with end-stage osteoarthritis.  He has tried multiple conservative treatment regimens, including activity modification and nonsteroidals.  It is to the point to where it is interfering with his activities of daily living.  It is felt due to his physical exam, x-rays, and interference with his activities that he would benefit from undergoing a right total knee arthroplasty.  The risks and benefits, as well as the procedure were discussed with the patient, and he agreed to proceed.  PAST MEDICAL HISTORY:  Significant for hypertension.  He has a history of diverticulitis.  He does have occasional hemorrhoids and seasonal allergies. He states that he had a stress in October of 2002 that was negative other than showing his exercise-induced hypertension.  MEDICATIONS: 1. Norvasc 5 mg one p.o. q.d. 2. Lipitor 40 mg one p.o. q.d. 3. Vioxx 25 mg one p.o. q.d. 4. Lopressor 50 mg half of a tablet one p.o. b.i.d. 5. Zyrtec 10 mg one p.o. q.d. 6. Folic acid 800 mcg one p.o. q.d. 7. Estra-C 1000 mg one p.o. q.d. 8. Vitamin E 800 iu one p.o. q.d. 9. Fish body oil 1000 mg one p.o. q.d.  The patient was instructed to discontinue his vitamins and herbal supplements prior to surgery and stop the Vioxx three days prior to his surgery as well.  ALLERGIES:  ______  FAMILY HISTORY:  Mother deceased at age 83 with a history of lung cancer  and TIAs.  Father deceased at age 8 with a history of cardiovascular disease.  He also had a brother that died at age 70 of a myocardial infarct.  SOCIAL HISTORY:  The patient is married.  He lives in a two-level home.  He is an Scientist, water quality.  He plans for home health physical therapy.  He drinks approximately three to four glasses of wine per month.  PAST SURGICAL HISTORY:  Right knee arthroplasty in March of 2001 and right knee arthroplasty in July of 2001.  REVIEW OF SYSTEMS:  General:  No fevers, chills, night sweats, or bleeding tendencies.  Pulmonary:  No shortness of breath, productive cough, or hemoptysis.  Cardiovascular:  No chest pain, angina, or orthopnea.  GI:  No nausea, vomiting, diarrhea, melena, or constipation.  GU:  No hematuria, dysuria, or discharge.  Neurologic:  No seizures, headaches, or paralysis. Musculoskeletal:  Right knee pain as described above.  PHYSICAL EXAMINATION:  GENERAL APPEARANCE:  An obese 70 year old male alert and oriented x 3.  VITAL SIGNS:  Pulse 82, respirations 18, blood pressure 150/70.  HEENT:  The head is normocephalic and atraumatic.  The oropharynx is clear.  NECK:  Supple.  Negative for carotid bruits bilaterally.  LUNGS:  Clear to auscultation bilaterally.  No rhonchi, rales, or wheezes.  BREASTS:  Not pertinent to  present illness.  HEART:  S1 and S2.  There is a systolic ejection murmur best heard at the tricuspid and pulmonic areas, grade 2/6.  ABDOMEN:  Round and nontender.  Positive bowel sounds.  GENITOURINARY:  Not pertinent to present illness.  EXTREMITIES:  He has crepitus with range of motion to the right knee.  He does walk with antalgic gait.  Varus deformity to the right knee.  Range of motion is 3 to about 120 degrees.  SKIN:  Intact.  No rashes or lesions appreciated on exam.  LABORATORY DATA AND X-RAYS:  Pending at this time.  IMPRESSION: 1. Osteoarthritis of the right knee. 2. Hypertension. 3.  History of diverticulitis. 4. Seasonal allergies.  PLAN:  The patient will be scheduled for a right total knee arthroplasty by R. Valma Cava, M.D., pending medical clearance from Titus Dubin. Alwyn Ren, M.D. Dictated by:   Dorie Rank, P.A. Attending:  R. Valma Cava, M.D. DD:  10/29/01 TD:  10/29/01 Job: [id number]

## 2011-05-20 ENCOUNTER — Encounter: Payer: Self-pay | Admitting: Internal Medicine

## 2011-05-21 ENCOUNTER — Other Ambulatory Visit: Payer: Self-pay | Admitting: Internal Medicine

## 2011-05-22 ENCOUNTER — Encounter: Payer: BC Managed Care – PPO | Admitting: Internal Medicine

## 2011-05-22 NOTE — Telephone Encounter (Signed)
Needs 1 patch for every 3 days out to sea

## 2011-05-23 MED ORDER — SCOPOLAMINE 1 MG/3DAYS TD PT72: 1.0000 | MEDICATED_PATCH | TRANSDERMAL | Status: DC

## 2011-05-23 NOTE — Telephone Encounter (Signed)
RX sent to pharmacy  

## 2011-05-23 NOTE — Telephone Encounter (Signed)
Patient  wants 2 boxes of patches - he said there is only 4 in a box [facility]

## 2011-05-27 ENCOUNTER — Ambulatory Visit (INDEPENDENT_AMBULATORY_CARE_PROVIDER_SITE_OTHER): Payer: BC Managed Care – PPO | Admitting: Internal Medicine

## 2011-05-27 ENCOUNTER — Encounter: Payer: Self-pay | Admitting: Internal Medicine

## 2011-05-27 VITALS — BP 132/80 | HR 59 | Temp 98.9°F | Resp 14 | Ht 72.0 in | Wt 291.2 lb

## 2011-05-27 DIAGNOSIS — J069 Acute upper respiratory infection, unspecified: Secondary | ICD-10-CM

## 2011-05-27 DIAGNOSIS — G4730 Sleep apnea, unspecified: Secondary | ICD-10-CM

## 2011-05-27 DIAGNOSIS — Z23 Encounter for immunization: Secondary | ICD-10-CM

## 2011-05-27 DIAGNOSIS — E8881 Metabolic syndrome: Secondary | ICD-10-CM

## 2011-05-27 DIAGNOSIS — E785 Hyperlipidemia, unspecified: Secondary | ICD-10-CM

## 2011-05-27 DIAGNOSIS — Z0000 Encounter for general adult medical examination without abnormal findings: Secondary | ICD-10-CM

## 2011-05-27 DIAGNOSIS — K219 Gastro-esophageal reflux disease without esophagitis: Secondary | ICD-10-CM

## 2011-05-27 DIAGNOSIS — I10 Essential (primary) hypertension: Secondary | ICD-10-CM

## 2011-05-27 DIAGNOSIS — E039 Hypothyroidism, unspecified: Secondary | ICD-10-CM

## 2011-05-27 MED ORDER — LOSARTAN POTASSIUM 100 MG PO TABS: 100.0000 mg | ORAL_TABLET | Freq: Every day | ORAL | Status: DC

## 2011-05-27 MED ORDER — AMOXICILLIN 500 MG PO CAPS: 500.0000 mg | ORAL_CAPSULE | Freq: Three times a day (TID) | ORAL | Status: AC

## 2011-05-27 MED ORDER — HYDROCHLOROTHIAZIDE 25 MG PO TABS: 25.0000 mg | ORAL_TABLET | ORAL | Status: DC

## 2011-05-27 NOTE — Patient Instructions (Signed)
Preventive Health Care: Exercise at least 30-45 minutes a day,  3-4 days a week.  Eat a low-fat diet with lots of fruits and vegetables, up to 7-9 servings per day. Your goal is waist measurement < 40 inches.Consume less than 40 grams of sugar per day from foods & drinks with High Fructose Corn Sugar as #1,2,3 or # 4 on label. Follow the low carb nutrition program in [employer] as closely as possible to prevent Diabetes progression & complications. White carbohydrates (potatoes, rice, bread, and pasta) have a high spike of sugar and a high load of sugar. For example a  baked potato has a cup of sugar and a  french fry  2 teaspoons of sugar. Yams, wild  rice, whole grained bread &  wheat pasta have been much lower spike and load of  sugar. Portions should be the size of a deck of cards or your palm.

## 2011-05-27 NOTE — Progress Notes (Signed)
Subjective:    Patient ID: Steven Munoz, male    DOB: June 22, 1941, 70 y.o.   MRN: [id number]  HPI He does not go on Medicare until 05/29/2011. He  is here for a physical;acute issues include a URI as of 7/29, onset  as ST. He has nasal congestion/obstruction; nasal purulence; facial pain;  &  earache . He denies frontal headache;fatigue; fever; or dental pain.       Review of Systems Patient reports no  Vision ( Ophth 10/11)/ hearing changes,anorexia, adenopathy, persistant / recurrent hoarseness, swallowing issues, chest pain,palpitations, edema,persistant / recurrent cough, hemoptysis, dyspnea(rest, exertional, paroxysmal nocturnal), gastrointestinal  bleeding (melena, rectal bleeding), abdominal pain, excessive heart burn, GU symptoms( dysuria, hematuria, pyuria, voiding/incontinence  issues) syncope, focal weakness, memory loss,numbness & tingling, skin/hair/nail changes,depression, anxiety, abnormal bruising/bleeding, or musculoskeletal symptoms/signs.  Purposeful weight loss of 15 + pounds. CVE > 2X/week for 45-60 minutes with trainer     Objective:   Physical Exam Gen.: Healthy and well-nourished in no distress. Weight excess. Alert, appropriate and cooperative throughout exam. Head: Normocephalic without obvious abnormalities;  No  alopecia  Eyes: No corneal or conjunctival inflammation noted. Pupils equal round reactive to light and accommodation. Fundal exam is benign without hemorrhages, exudate, papilledema. Extraocular motion intact. Vision good with lenses Ears: External  ear exam reveals no significant lesions or deformities. Canals clear .TMs normal. Hearing is grossly normal bilaterally. Nose: External nasal exam reveals no deformity or inflammation. Nasal mucosa are pink and moist. No lesions or exudates noted. Septum  normal  Mouth: Oral mucosa and oropharynx reveal no lesions or exudates. Teeth in good repair. Neck: No deformities, masses, or tenderness noted. Range of  motion & . Thyroid  normal. Lungs: Normal respiratory effort; chest expands symmetrically. Lungs are clear to auscultation without rales, wheezes, or increased work of breathing. Heart: Normal rate and rhythm. Normal S1 and S2. No gallop, click, or rub. Grade 1/6 systolic  murmur. Abdomen: Bowel sounds normal; abdomen soft and nontender. No masses, organomegaly or hernias noted. Dullness RUQ Genitalia/ DRE: normal prostate; varicocele  On L   .                                                                                   Musculoskeletal/extremities: No deformity or scoliosis noted of  the thoracic or lumbar spine. No clubbing or cyanosis. Minor DIP OA hanges noted. Fusiform knee changes .Tone & strength  normal.Joints normal. Nail health  Good. Trace edema.  Vascular: Carotid, radial artery, dorsalis pedis and  posterior tibial pulses are full and equal. No bruits present. Neurologic: Alert and oriented x3. Deep tendon reflexes symmetrical and normal.          Skin: Intact without suspicious lesions or rashes. Lymph: No cervical, axillary, or inguinal lymphadenopathy present. Psych: Mood and affect are normal. Normally interactive  Assessment & Plan:   #1 comprehensive physical exam; no acute findings #2 see Problem List with Assessments & Recommendations #3 URI  Plan: see Orders

## 2011-05-28 LAB — CBC WITH DIFFERENTIAL/PLATELET
Basophils Relative: 0.7 % (ref 0.0–3.0)
Eosinophils Absolute: 0.4 10*3/uL (ref 0.0–0.7)
Eosinophils Relative: 6.9 % — ABNORMAL HIGH (ref 0.0–5.0)
Hemoglobin: 15.4 g/dL (ref 13.0–17.0)
Lymphocytes Relative: 46.9 % — ABNORMAL HIGH (ref 12.0–46.0)
MCHC: 33.4 g/dL (ref 30.0–36.0)
Monocytes Relative: 12.6 % — ABNORMAL HIGH (ref 3.0–12.0)
Neutro Abs: 1.9 10*3/uL (ref 1.4–7.7)
RBC: 4.78 Mil/uL (ref 4.22–5.81)

## 2011-05-28 LAB — HEPATIC FUNCTION PANEL
Albumin: 5.1 g/dL (ref 3.5–5.2)
Alkaline Phosphatase: 50 U/L (ref 39–117)

## 2011-05-28 LAB — LIPID PANEL
LDL Cholesterol: 64 mg/dL (ref 0–99)
Total CHOL/HDL Ratio: 3
Triglycerides: 109 mg/dL (ref 0.0–149.0)

## 2011-05-28 LAB — BASIC METABOLIC PANEL
BUN: 25 mg/dL — ABNORMAL HIGH (ref 6–23)
CO2: 26 mEq/L (ref 19–32)
Chloride: 104 mEq/L (ref 96–112)
Glucose, Bld: 89 mg/dL (ref 70–99)
Potassium: 3.7 mEq/L (ref 3.5–5.1)

## 2011-05-29 ENCOUNTER — Other Ambulatory Visit: Payer: Self-pay | Admitting: Internal Medicine

## 2011-05-29 DIAGNOSIS — I10 Essential (primary) hypertension: Secondary | ICD-10-CM

## 2011-05-29 NOTE — Telephone Encounter (Signed)
Patient ha [id number] - he needs prescriptions for all his meds to mail to new mail order company  - he said dr hopper wasn't able to print them

## 2011-05-30 MED ORDER — ROSUVASTATIN CALCIUM 20 MG PO TABS
20.0000 mg | ORAL_TABLET | Freq: Every day | ORAL | Status: DC
Start: 2011-05-30 — End: 2012-06-16

## 2011-05-30 MED ORDER — LOSARTAN POTASSIUM 100 MG PO TABS: 100.0000 mg | ORAL_TABLET | Freq: Every day | ORAL | Status: DC

## 2011-05-30 MED ORDER — OMEPRAZOLE 20 MG PO CPDR: 20.0000 mg | DELAYED_RELEASE_CAPSULE | Freq: Every day | ORAL | Status: DC

## 2011-05-30 MED ORDER — HYDROCHLOROTHIAZIDE 25 MG PO TABS: 25.0000 mg | ORAL_TABLET | ORAL | Status: DC

## 2011-05-30 MED ORDER — METOPROLOL TARTRATE 25 MG PO TABS: ORAL_TABLET | ORAL | Status: DC

## 2011-05-30 MED ORDER — LEVOTHYROXINE SODIUM 25 MCG PO TABS: ORAL_TABLET | ORAL | Status: DC

## 2011-05-30 NOTE — Telephone Encounter (Signed)
Spoke with patient, patient requested all meds be filled for 90 day except Thyroid med be filled for 30 day supply at a time. Patient's wife is coming in for an appointment today and ok to give scripts to her

## 2011-06-10 ENCOUNTER — Telehealth: Payer: Self-pay

## 2011-06-10 MED ORDER — HYDROCHLOROTHIAZIDE 12.5 MG PO CAPS: 12.5000 mg | ORAL_CAPSULE | Freq: Every day | ORAL | Status: DC

## 2011-06-10 NOTE — Telephone Encounter (Signed)
Faxed note from pharmacy: Please verify the direction and quantity for HCTZ 25mg  to fill prescription for 90 supply. Does patient take 1 tab by mouth or 1/2 daily, please update the prescription and dispense number.   I spoke with patient and informed him of options 25mg  1/2 by mouth daily #45 daily or 12.5mg  1 by mouth daily #90. Patient's preference is 12.5mg  daily, this way he doesn't have to cut pill in half.

## 2011-06-18 ENCOUNTER — Encounter: Payer: Self-pay | Admitting: Internal Medicine

## 2011-08-21 ENCOUNTER — Ambulatory Visit (INDEPENDENT_AMBULATORY_CARE_PROVIDER_SITE_OTHER): Payer: Medicare Other

## 2011-08-21 DIAGNOSIS — Z23 Encounter for immunization: Secondary | ICD-10-CM

## 2012-01-01 ENCOUNTER — Encounter: Payer: Self-pay | Admitting: Family Medicine

## 2012-01-01 ENCOUNTER — Ambulatory Visit (INDEPENDENT_AMBULATORY_CARE_PROVIDER_SITE_OTHER): Payer: Medicare Other | Admitting: Family Medicine

## 2012-01-01 VITALS — BP 132/85 | HR 53 | Temp 98.0°F | Ht 69.75 in | Wt 288.0 lb

## 2012-01-01 DIAGNOSIS — J302 Other seasonal allergic rhinitis: Secondary | ICD-10-CM

## 2012-01-01 DIAGNOSIS — J309 Allergic rhinitis, unspecified: Secondary | ICD-10-CM

## 2012-01-01 MED ORDER — BENZONATATE 200 MG PO CAPS: 200.0000 mg | ORAL_CAPSULE | Freq: Three times a day (TID) | ORAL | Status: AC | PRN

## 2012-01-01 MED ORDER — FLUTICASONE PROPIONATE 50 MCG/ACT NA SUSP: 2.0000 | Freq: Every day | NASAL | Status: DC

## 2012-01-01 NOTE — Assessment & Plan Note (Signed)
New.  No evidence of bacterial infxn.  Pt's sxs consistent w/ seasonal allergies.  Start nasal steroid, OTC antihistamine.  Cough meds prn.  Reviewed supportive care and red flags that should prompt return.  Pt expressed understanding and is in agreement w/ plan.

## 2012-01-01 NOTE — Patient Instructions (Signed)
This is most likely an allergy/viral combo Start OTC Claritin or Zyrtec Start the Flonase- 2 sprays each nostril daily Drink plenty of fluids Use the Tessalon as needed for cough REST! Hang in there!

## 2012-01-01 NOTE — Progress Notes (Signed)
  Subjective:    Patient ID: Steven Munoz, male    DOB: Nov 26, 1940, 71 y.o.   MRN: [id number]  HPI URI- sxs started on Saturday.  + nasal congestion, watery eyes, cough- dry.  Robitussin w/out relief.  Mild ear discomfort.  + facial pressure.  No fever.  + sick contacts.   Review of Systems For ROS see HPI     Objective:   Physical Exam  Vitals reviewed. Constitutional: He appears well-developed and well-nourished. No distress.  HENT:  Head: Normocephalic and atraumatic.       No TTP over sinuses + turbinate edema + PND TMs normal bilaterally  Eyes: Conjunctivae and EOM are normal. Pupils are equal, round, and reactive to light.  Neck: Normal range of motion. Neck supple.  Cardiovascular: Normal rate, regular rhythm and normal heart sounds.   Pulmonary/Chest: Effort normal and breath sounds normal. No respiratory distress. He has no wheezes.       + hacking cough  Lymphadenopathy:    He has no cervical adenopathy.  Skin: Skin is warm and dry.          Assessment & Plan:

## 2012-01-15 ENCOUNTER — Encounter: Payer: Self-pay | Admitting: Cardiovascular Disease

## 2012-01-15 ENCOUNTER — Ambulatory Visit (INDEPENDENT_AMBULATORY_CARE_PROVIDER_SITE_OTHER): Payer: Medicare Other | Admitting: Cardiovascular Disease

## 2012-01-15 VITALS — BP 145/69 | HR 56 | Resp 18 | Ht 73.0 in | Wt 289.4 lb

## 2012-01-15 DIAGNOSIS — E785 Hyperlipidemia, unspecified: Secondary | ICD-10-CM

## 2012-01-15 DIAGNOSIS — I4891 Unspecified atrial fibrillation: Secondary | ICD-10-CM

## 2012-01-15 DIAGNOSIS — I2510 Atherosclerotic heart disease of native coronary artery without angina pectoris: Secondary | ICD-10-CM

## 2012-01-15 DIAGNOSIS — I10 Essential (primary) hypertension: Secondary | ICD-10-CM

## 2012-01-15 NOTE — Assessment & Plan Note (Signed)
Well controlled.  Continue current medications and low sodium Dash type diet.   Told him to take cozaar in am not night.

## 2012-01-15 NOTE — Progress Notes (Signed)
August is S/P CABG with periop afib in 05/2009 . He has recovered well. He has good LV function. He finished caardiac rehab. There has been no SSCP, palpitations, PND, orthopnea, and only trace edema . He is compliant with his meds for BP and cholesterol.  His BP is borderline and he had LE edema. HCTZ was added to his meds in March 2012 . He has had improvement in his BP and edema. He has to stick with a low carb diet as his weight is up again  ROS: Denies fever, malais, weight loss, blurry vision, decreased visual acuity, cough, sputum, SOB, hemoptysis, pleuritic pain, palpitaitons, heartburn, abdominal pain, melena, lower extremity edema, claudication, or rash.  All other systems reviewed and negative  General: Affect appropriate Healthy:  appears stated age HEENT: normal Neck supple with no adenopathy JVP normal no bruits no thyromegaly Lungs clear with no wheezing and good diaphragmatic motion Heart:  S1/S2 no murmur, no rub, gallop or click PMI normal Abdomen: benighn, BS positve, no tenderness, no AAA no bruit.  No HSM or HJR Distal pulses intact with no bruits No edema Neuro non-focal Skin warm and dry No muscular weakness   Current Outpatient Prescriptions  Medication Sig Dispense Refill  . aspirin 325 MG tablet Take 325 mg by mouth daily.        . Coenzyme Q10 (COQ10) 100 MG CAPS Take by mouth. 1 po daily       . fluticasone (FLONASE) 50 MCG/ACT nasal spray Place 2 sprays into the nose daily.  16 g  6  . FOLIC ACID PO Take 600 mg by mouth daily.        . hydrochlorothiazide (,MICROZIDE/HYDRODIURIL,) 12.5 MG capsule Take 1 capsule (12.5 mg total) by mouth daily.  90 capsule  3  . levothyroxine (SYNTHROID, LEVOTHROID) 25 MCG tablet 1 po daily except 1 and 1/2 tab tues,thurs,sat  38 tablet  11  . losartan (COZAAR) 100 MG tablet Take 1 tablet (100 mg total) by mouth daily.  90 tablet  3  . Magnesium 200 MG TABS Take by mouth. 1 po daily       . metoprolol tartrate (LOPRESSOR) 25  MG tablet 1/2 po bid  90 tablet  3  . Multiple Vitamin (MULTIVITAMIN) tablet Take 1 tablet by mouth daily.        . Omega-3 Fatty Acids (FISH OIL) 1200 MG CAPS Take by mouth. 1 po daily       . omeprazole (PRILOSEC) 20 MG capsule Take 1 capsule (20 mg total) by mouth daily.  90 capsule  3  . rosuvastatin (CRESTOR) 20 MG tablet Take 1 tablet (20 mg total) by mouth daily.  90 tablet  3  . scopolamine (TRANSDERM-SCOP) 1.5 MG Place 1 patch (1.5 mg total) onto the skin every 3 (three) days.  8 patch  1    Allergies  Ofloxacin and Povidone-iodine  Electrocardiogram:   NSR rate 54 PR 220 PVC nonspecific T wave changes Assessment and Plan

## 2012-01-15 NOTE — Assessment & Plan Note (Signed)
Stable with no angina and good activity level.  Continue medical Rx  

## 2012-01-15 NOTE — Assessment & Plan Note (Signed)
Cholesterol is at goal.  Continue current dose of statin and diet Rx.  No myalgias or side effects.  F/U  LFT's in 6 months. Lab Results  Component Value Date   LDLCALC 64 05/27/2011

## 2012-01-15 NOTE — Patient Instructions (Signed)
Your physician wants you to follow-up in: YEAR WITH DR NISHAN  You will receive a reminder letter in the mail two months in advance. If you don't receive a letter, please call our office to schedule the follow-up appointment.  Your physician recommends that you continue on your current medications as directed. Please refer to the Current Medication list given to you today. 

## 2012-01-15 NOTE — Assessment & Plan Note (Signed)
Post CABG  nonrecurrent continue ASA and beta blocker

## 2012-01-20 NOTE — Progress Notes (Signed)
Addended by: Judithe Modest D on: 01/20/2012 04:39 PM   Modules accepted: Orders

## 2012-01-27 ENCOUNTER — Other Ambulatory Visit: Payer: Self-pay | Admitting: Rheumatology

## 2012-01-27 ENCOUNTER — Ambulatory Visit
Admission: RE | Admit: 2012-01-27 | Discharge: 2012-01-27 | Disposition: A | Discharge status: Home / Self Care | Payer: Medicare Other | Source: Ambulatory Visit | Attending: Rheumatology | Admitting: Rheumatology

## 2012-01-27 DIAGNOSIS — R1031 Right lower quadrant pain: Secondary | ICD-10-CM

## 2012-01-30 ENCOUNTER — Other Ambulatory Visit: Payer: Self-pay | Admitting: Rheumatology

## 2012-01-30 ENCOUNTER — Ambulatory Visit
Admission: RE | Admit: 2012-01-30 | Discharge: 2012-01-30 | Disposition: A | Discharge status: Home / Self Care | Payer: Medicare Other | Source: Ambulatory Visit | Attending: Rheumatology | Admitting: Rheumatology

## 2012-01-30 DIAGNOSIS — M25551 Pain in right hip: Secondary | ICD-10-CM

## 2012-01-30 DIAGNOSIS — R1030 Lower abdominal pain, unspecified: Secondary | ICD-10-CM

## 2012-01-30 MED ORDER — IOHEXOL 180 MG/ML  SOLN
1.0000 mL | Freq: Once | INTRAMUSCULAR | Status: AC | PRN
  Administered 2012-01-30: 1 mL via INTRA_ARTICULAR

## 2012-01-30 MED ORDER — METHYLPREDNISOLONE ACETATE 40 MG/ML INJ SUSP (RADIOLOG
120.0000 mg | Freq: Once | INTRAMUSCULAR | Status: AC
  Administered 2012-01-30: 120 mg via INTRA_ARTICULAR

## 2012-01-31 ENCOUNTER — Telehealth: Payer: Self-pay | Admitting: Cardiovascular Disease

## 2012-01-31 NOTE — Telephone Encounter (Signed)
Ok per Norma Fredrickson for Mr Coker to stop his asa 5 days prior to his surgery.

## 2012-01-31 NOTE — Telephone Encounter (Signed)
New Problem:     Patient is going to have oral surgery next Wednesday and his surgeon, Dr. Bettina Gavia [phone], wants him to stop taking his aspirin 325 MG tablet about three to four days before, and wanted him to check to see if it was okay and when he should start back..  Please call back.

## 2012-01-31 NOTE — Telephone Encounter (Signed)
Pt was notified and message was left at the Oral Surgeon's office.

## 2012-03-10 ENCOUNTER — Encounter: Payer: Self-pay | Admitting: Internal Medicine

## 2012-03-10 ENCOUNTER — Ambulatory Visit (INDEPENDENT_AMBULATORY_CARE_PROVIDER_SITE_OTHER): Payer: Medicare Other | Admitting: Internal Medicine

## 2012-03-10 VITALS — BP 138/78 | HR 50 | Temp 97.8°F | Wt 280.0 lb

## 2012-03-10 DIAGNOSIS — R059 Cough, unspecified: Secondary | ICD-10-CM

## 2012-03-10 DIAGNOSIS — R05 Cough: Secondary | ICD-10-CM

## 2012-03-10 MED ORDER — AZITHROMYCIN 250 MG PO TABS: ORAL_TABLET | ORAL | Status: AC

## 2012-03-10 MED ORDER — HYDROCOD POLST-CHLORPHEN POLST 10-8 MG/5ML PO LQCR: 5.0000 mL | Freq: Two times a day (BID) | ORAL | Status: DC | PRN

## 2012-03-10 NOTE — Progress Notes (Signed)
  Subjective:    Patient ID: Steven Munoz, male    DOB: 07/11/1941, 71 y.o.   MRN: [id number]  HPI Acute visit Cough for the last 3 days, the cough sometimes is persistent, cough 20 times in a row. He also has mild chest congestion, symptoms are day and night . Recently, he took an abx (ugmentin ?) for 2 weeks, finish a 10 days ago. Antibiotics were prescribed by his dentist and ENT for possibly dental infection. ENT also prescribe Flonase and prednisone, already finished prednisone   Past Medical History  Diagnosis Date  . Murmur   . Thyroid disease     hypothyroidism  . Hyperlipidemia   . Hypertension   . Abnormal electrocardiogram   . Diverticulosis   . GERD (gastroesophageal reflux disease)     S/P dilation  2005  . Sleep apnea   . Dermatophytosis of nail   . Dysmetabolic syndrome   . Gout   . Colonic polyp 2002, 2005, 2009    Dr Kinnie Scales  . 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 Nephrolithiasis   . Pneumonia age 89    Review of Systems No fever or chills No nausea, vomiting, diarrhea No sinus pain at this time, no postnasal dripping. Denies a history of asthma, COPD. He's not a smoker. No GERD symptoms     Objective:   Physical Exam  General -- alert, well-developed, and overweight appearing. No apparent distress.  HEENT -- TMs normal, throat w/o redness, face symmetric and not tender to palpation, nose not congested Lungs -- normal respiratory effort, no intercostal retractions, no accessory muscle use, and normal breath sounds.   Heart-- normal rate, regular rhythm, no murmur, and no gallop.   Extremities-- no pretibial edema bilaterally  psych-- Cognition and judgment appear intact. Alert and cooperative with normal attention span and concentration.  not anxious appearing and not depressed appearing.       Assessment & Plan:   Cough, Persisting cough, viral versus atypical  infection. Was treated with cough suppressant for a few days, Z-Pak if he is not improving

## 2012-03-10 NOTE — Patient Instructions (Signed)
For cough, take Mucinex DM twice a day as needed  If the cough continue, take tussionex, will cause drowsiness. Take the antibiotic as prescribed  (zpack) only if not better in 4 to 5 days Call if no better in few days Call anytime if the symptoms are severe

## 2012-04-21 ENCOUNTER — Other Ambulatory Visit: Payer: Self-pay | Admitting: *Deleted

## 2012-04-21 MED ORDER — LEVOTHYROXINE SODIUM 25 MCG PO TABS: ORAL_TABLET | ORAL | Status: DC

## 2012-04-21 NOTE — Telephone Encounter (Signed)
Pt aware Rx ready for pick up 

## 2012-05-28 ENCOUNTER — Ambulatory Visit (INDEPENDENT_AMBULATORY_CARE_PROVIDER_SITE_OTHER): Payer: Medicare Other | Admitting: Internal Medicine

## 2012-05-28 ENCOUNTER — Encounter: Payer: Self-pay | Admitting: Internal Medicine

## 2012-05-28 VITALS — BP 114/64 | HR 62 | Temp 98.1°F | Resp 14 | Ht 72.3 in | Wt 286.0 lb

## 2012-05-28 DIAGNOSIS — Z0000 Encounter for general adult medical examination without abnormal findings: Secondary | ICD-10-CM

## 2012-05-28 DIAGNOSIS — I10 Essential (primary) hypertension: Secondary | ICD-10-CM

## 2012-05-28 DIAGNOSIS — E8881 Metabolic syndrome: Secondary | ICD-10-CM

## 2012-05-28 DIAGNOSIS — E785 Hyperlipidemia, unspecified: Secondary | ICD-10-CM

## 2012-05-28 NOTE — Patient Instructions (Addendum)
Preventive Health Care: Exercise at least 30-45 minutes a day,  3-4 days a week.  Eat a low-fat diet with lots of fruits and vegetables, up to 7-9 servings per day.  Consume less than 40 grams of sugar per day from foods & drinks with High Fructose Corn Sugar as # 1,2,3 or # 4 on label. Please  schedule fasting Labs : BMET,Lipids, hepatic panel, CBC & dif, TSH, PSA, A1c.Codes: 272.4,790.29,401.9, 995.20,abnormal prostate exam by DRE  exam  Please try to go on My Chart within the next 24 hours to allow me to release the results directly to you.

## 2012-05-28 NOTE — Progress Notes (Signed)
Subjective:    Patient ID: Steven Munoz, male    DOB: Nov 08, 1940, 71 y.o.   MRN: [id number]  HPI Medicare Wellness Visit:  The following psychosocial & medical history were reviewed as required by Medicare.   Social history: caffeine: none , alcohol:  Socially , average < 5 wine/ week ,  tobacco use : quit 1968  & exercise :CVE 3 X/ week > 60 min (bike, treadmill,weights).   Home & personal  safety / fall risk: no issues, activities of daily living:no limitations , seatbelt use : yes , and smoke alarm employment : yes .  Power of Attorney/Living Will status : in place  Vision ( as recorded per Nurse) & Hearing  evaluation :  Ophth exam every October. Orientation :oriented X 3 , memory & recall :good,  math testing: good,and mood & affect : normal. Depression / anxiety: denied Travel history : [place] 2011 , immunization status :? Pneumovax & Shingles needed, transfusion history:  no, and preventive health surveillance ( colonoscopies, BMD , etc as per protocol/ [facility]): colonoscopy up to date, Dental care:  Seen regularly. Chart reviewed &  Updated. Active issues reviewed & addressed.      Review of Systems HYPERTENSION: Disease Monitoring: Blood pressure range-usually 125-135/75-80  Chest pain, palpitations- no       Dyspnea- no Medications: Compliance- yes  Lightheadedness,Syncope- no    Edema- no  FASTING HYPERGLYCEMIA, PMH of:  Disease Monitoring: Blood Sugar ranges-no monitor  Polyuria/phagia/dipsia- no       Visual problems- no  HYPERLIPIDEMIA: Disease Monitoring: See symptoms for Hypertension Medications: Compliance- yes  Abd pain, bowel changes- no   Muscle aches- no          Objective:   Physical Exam Gen.:  well-nourished in appearance. Alert, appropriate and cooperative throughout exam. Head: Normocephalic without obvious abnormalities  Eyes: No corneal or conjunctival inflammation noted. Pupils equal round reactive to light and accommodation. Fundal exam is  benign without hemorrhages, exudate, papilledema. Extraocular motion intact. Vision grossly normal with lenses. Slight ptosis OD Ears: External  ear exam reveals no significant lesions or deformities. Canals clear .TMs normal. Hearing is grossly slightly reduced on L Nose: External nasal exam reveals no deformity or inflammation. Nasal mucosa are pink and moist. No lesions or exudates noted.  Mouth: Oral mucosa and oropharynx reveal no lesions or exudates. Teeth in excellent repair. Neck: No deformities, masses, or tenderness noted. Range of motion good; Thyroid normal. Lungs: Normal respiratory effort; chest expands symmetrically. Lungs are clear to auscultation without rales, wheezes, or increased work of breathing. Heart: Normal rate and rhythm. Normal S1 and S2. No gallop, click, or rub. Grade 1.5 over 6 systolic murmur  L base. Abdomen: Bowel sounds normal; abdomen soft and nontender. No masses, organomegaly or hernias noted. Genitalia/DRE: Genitalia normal . Right prostatic lobe is normal; there is mild asymmetric enlargement of the left without definite nodularity or induration.  Musculoskeletal/extremities: No deformity or scoliosis noted of  the thoracic or lumbar spine. No clubbing or  cyanosis. Range of motion  Decreased @ knees; L knee crepitus .Tone & strength  normal.Joints : mild OA changes.Trace edema @ sock line Vascular: Carotid, radial artery, dorsalis pedis and  posterior tibial pulses are full and equal. No bruits present. Neurologic: Alert and oriented x3. Deep tendon reflexes symmetrical and normal.          Skin: Intact without suspicious lesions or rashes. Lymph: No cervical, axillary, or inguinal lymphadenopathy present. Psych: Mood and affect  are normal. Normally interactive                                                                                         Assessment & Plan:  #1 Medicare Wellness Exam; criteria met ; data entered #2 Problem List reviewed ;  Assessment/ Recommendations made Plan: see Orders

## 2012-05-29 ENCOUNTER — Other Ambulatory Visit (INDEPENDENT_AMBULATORY_CARE_PROVIDER_SITE_OTHER): Payer: Medicare Other

## 2012-05-29 DIAGNOSIS — M109 Gout, unspecified: Secondary | ICD-10-CM

## 2012-05-29 DIAGNOSIS — Z125 Encounter for screening for malignant neoplasm of prostate: Secondary | ICD-10-CM

## 2012-05-29 DIAGNOSIS — R7309 Other abnormal glucose: Secondary | ICD-10-CM

## 2012-05-29 DIAGNOSIS — T887XXA Unspecified adverse effect of drug or medicament, initial encounter: Secondary | ICD-10-CM

## 2012-05-29 DIAGNOSIS — I10 Essential (primary) hypertension: Secondary | ICD-10-CM

## 2012-05-29 DIAGNOSIS — E785 Hyperlipidemia, unspecified: Secondary | ICD-10-CM

## 2012-05-29 LAB — CBC WITH DIFFERENTIAL/PLATELET
Basophils Absolute: 0 10*3/uL (ref 0.0–0.1)
Basophils Relative: 0.9 % (ref 0.0–3.0)
Hemoglobin: 15 g/dL (ref 13.0–17.0)
Lymphocytes Relative: 34.8 % (ref 12.0–46.0)
Monocytes Relative: 9.2 % (ref 3.0–12.0)
Neutro Abs: 2.1 10*3/uL (ref 1.4–7.7)
RBC: 4.56 Mil/uL (ref 4.22–5.81)

## 2012-05-29 LAB — HEPATIC FUNCTION PANEL
Albumin: 4.3 g/dL (ref 3.5–5.2)
Total Protein: 7 g/dL (ref 6.0–8.3)

## 2012-05-29 LAB — LIPID PANEL
Cholesterol: 134 mg/dL (ref 0–200)
HDL: 43 mg/dL (ref >39.00)
LDL Cholesterol: 71 mg/dL (ref 0–99)
Triglycerides: 99 mg/dL (ref 0.0–149.0)
VLDL: 19.8 mg/dL (ref 0.0–40.0)

## 2012-05-29 LAB — HEMOGLOBIN A1C: Hgb A1c MFr Bld: 5.2 % (ref 4.6–6.5)

## 2012-05-29 LAB — URIC ACID: Uric Acid, Serum: 6.7 mg/dL (ref 4.0–7.8)

## 2012-05-29 LAB — BASIC METABOLIC PANEL
BUN: 15 mg/dL (ref 6–23)
Chloride: 102 mEq/L (ref 96–112)
Potassium: 4 mEq/L (ref 3.5–5.1)

## 2012-05-29 LAB — PSA: PSA: 0.55 ng/mL (ref 0.10–4.00)

## 2012-05-29 NOTE — Progress Notes (Signed)
Labs only

## 2012-06-16 ENCOUNTER — Other Ambulatory Visit: Payer: Self-pay | Admitting: *Deleted

## 2012-06-16 DIAGNOSIS — I10 Essential (primary) hypertension: Secondary | ICD-10-CM

## 2012-06-16 MED ORDER — METOPROLOL TARTRATE 25 MG PO TABS: ORAL_TABLET | ORAL | Status: DC

## 2012-06-16 MED ORDER — ROSUVASTATIN CALCIUM 20 MG PO TABS: 20.0000 mg | ORAL_TABLET | Freq: Every day | ORAL | Status: DC

## 2012-06-16 MED ORDER — LEVOTHYROXINE SODIUM 25 MCG PO TABS: ORAL_TABLET | ORAL | Status: DC

## 2012-06-16 MED ORDER — SCOPOLAMINE 1 MG/3DAYS TD PT72: 1.0000 | MEDICATED_PATCH | TRANSDERMAL | Status: DC

## 2012-06-16 MED ORDER — OMEPRAZOLE 20 MG PO CPDR: 20.0000 mg | DELAYED_RELEASE_CAPSULE | Freq: Every day | ORAL | Status: DC

## 2012-06-16 MED ORDER — LOSARTAN POTASSIUM 100 MG PO TABS: 100.0000 mg | ORAL_TABLET | Freq: Every day | ORAL | Status: DC

## 2012-06-16 NOTE — Telephone Encounter (Signed)
If he is having significant balance problems simply sitting in a car; a neurology evaluation is indicated. This is a medication that should be used as infrequently as possible due to potential  adverse effects

## 2012-06-16 NOTE — Telephone Encounter (Signed)
Pt states that he uses these Patches when he goes driving and like to have them on hand. Pt notes that he only has 1 patch left..Please advise

## 2012-06-16 NOTE — Telephone Encounter (Signed)
This patch is to be used every 3 days as needed while sailing;it  is not a maintenance agent. If he is going on a cruise he can have one patch every 3 days .

## 2012-06-16 NOTE — Telephone Encounter (Signed)
Discuss with patient. 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 Per Dr Alwyn Ren ok to fill med x1. Pt aware Rx ready for pick up.

## 2012-06-16 NOTE — Telephone Encounter (Signed)
Trans derm Last OV 05-28-12, last filled 05-23-11 #8 1.Please advise

## 2012-06-30 ENCOUNTER — Other Ambulatory Visit: Payer: Self-pay | Admitting: Orthopedic Surgery

## 2012-06-30 DIAGNOSIS — M1611 Unilateral primary osteoarthritis, right hip: Secondary | ICD-10-CM

## 2012-07-14 ENCOUNTER — Ambulatory Visit
Admission: RE | Admit: 2012-07-14 | Discharge: 2012-07-14 | Disposition: A | Discharge status: Home / Self Care | Payer: Medicare Other | Source: Ambulatory Visit | Attending: Orthopedic Surgery | Admitting: Orthopedic Surgery

## 2012-07-14 DIAGNOSIS — M1611 Unilateral primary osteoarthritis, right hip: Secondary | ICD-10-CM

## 2012-07-14 MED ORDER — METHYLPREDNISOLONE ACETATE 40 MG/ML INJ SUSP (RADIOLOG
120.0000 mg | Freq: Once | INTRAMUSCULAR | Status: AC
  Administered 2012-07-14: 120 mg via INTRA_ARTICULAR

## 2012-07-14 MED ORDER — IOHEXOL 180 MG/ML  SOLN
1.0000 mL | Freq: Once | INTRAMUSCULAR | Status: AC | PRN
  Administered 2012-07-14: 1 mL via INTRA_ARTICULAR

## 2012-07-21 ENCOUNTER — Telehealth: Payer: Self-pay

## 2012-07-21 MED ORDER — HYDROCHLOROTHIAZIDE 12.5 MG PO CAPS: 12.5000 mg | ORAL_CAPSULE | Freq: Every day | ORAL | Status: DC

## 2012-07-21 NOTE — Telephone Encounter (Signed)
Pt requesting 90 refill of HCTZ be sent to [facility].

## 2012-08-26 ENCOUNTER — Ambulatory Visit (INDEPENDENT_AMBULATORY_CARE_PROVIDER_SITE_OTHER): Payer: Medicare Other

## 2012-08-26 DIAGNOSIS — Z23 Encounter for immunization: Secondary | ICD-10-CM

## 2012-10-30 ENCOUNTER — Ambulatory Visit: Payer: Medicare Other | Admitting: Internal Medicine

## 2012-10-30 ENCOUNTER — Encounter: Payer: Self-pay | Admitting: Family Medicine

## 2012-10-30 ENCOUNTER — Ambulatory Visit (INDEPENDENT_AMBULATORY_CARE_PROVIDER_SITE_OTHER): Payer: Medicare Other | Admitting: Family Medicine

## 2012-10-30 VITALS — BP 124/70 | HR 54 | Temp 98.3°F | Wt 274.8 lb

## 2012-10-30 DIAGNOSIS — T7840XA Allergy, unspecified, initial encounter: Secondary | ICD-10-CM

## 2012-10-30 NOTE — Progress Notes (Signed)
  Subjective:     Steven Munoz is a 72 y.o. male who presents for evaluation of a rash involving the ear, face, trunk, upper body and upper extremity. Rash started 1 week ago. Lesions are pink, and raised in texture. Rash has changed over time. Rash is pruritic. Associated symptoms: none. Patient denies: abdominal pain, arthralgia, congestion, cough, crankiness, decrease in appetite, decrease in energy level, fever, headache, irritability, myalgia, nausea, sore throat and vomiting. Patient has not had contacts with similar rash. Patient has had new exposures (soaps, lotions, laundry detergents, foods, medications, plants, insects or animals).  Hives come and go and the new exposures were , ivory soap--he usually uses dial and had chocolate covered pomegranates, and a new supplement for joints--- he stopped the choc and supplement a week ago.   The following portions of the patient's history were reviewed and updated as appropriate: allergies, current medications, past family history, past medical history, past social history, past surgical history and problem list.  Review of Systems Pertinent items are noted in HPI.    Objective:    BP 124/70  Pulse 54  Temp 98.3 F (36.8 C) (Oral)  Wt 274 lb 12.8 oz (124.648 kg)  SpO2 96% General:  alert, cooperative, appears stated age and no distress  Skin:  few hives around neck     Assessment:    urticaria--- ? 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 verbal and written patient instruction given. Follow up in a few days. --if not getting better or worsens

## 2012-10-30 NOTE — Patient Instructions (Signed)

## 2012-11-20 HISTORY — PX: NOSE SURGERY: SHX723

## 2012-11-23 ENCOUNTER — Ambulatory Visit (INDEPENDENT_AMBULATORY_CARE_PROVIDER_SITE_OTHER): Payer: Medicare Other | Admitting: Internal Medicine

## 2012-11-23 ENCOUNTER — Encounter: Payer: Self-pay | Admitting: Internal Medicine

## 2012-11-23 VITALS — BP 128/80 | HR 57 | Temp 97.4°F | Wt 274.0 lb

## 2012-11-23 DIAGNOSIS — L508 Other urticaria: Secondary | ICD-10-CM

## 2012-11-23 DIAGNOSIS — L509 Urticaria, unspecified: Secondary | ICD-10-CM

## 2012-11-23 MED ORDER — HYDROXYZINE HCL 10 MG PO TABS: 10.0000 mg | ORAL_TABLET | Freq: Three times a day (TID) | ORAL | Status: DC | PRN

## 2012-11-23 MED ORDER — RANITIDINE HCL 150 MG PO TABS: ORAL_TABLET | ORAL | Status: DC

## 2012-11-23 NOTE — Patient Instructions (Addendum)
Go to WebMD for information about Urticaria or Hives.  Restrict hyperallergenic foods at this time: Nuts, strawberries, seafood , chocolate, and tomatoes.   Please keep a food diary of possible food or drink  Triggers. Zicam Melts as needed for sore throat. Report fever, exudate("pus") or progressive pain.

## 2012-11-23 NOTE — Progress Notes (Signed)
  Subjective:    Patient ID: Steven Munoz, male    DOB: [birth year], 72 y.o.   MRN: [id number]  HPI Skin lesions began as  Hives  localized to R ear & scalp ChristmasEve . They progressed to neck , trunk & legs The lesions were described as pruritic, non  tender  & associated with red color change. The dermatologic lesions waxed & waned especially after lunch until 1/22. Red wine that night may have  caused it. No recurrence since. Treatment with Benadryl , hydrocortisone with Aloe was partially effective  There was no associated trigger or context such as exposure to animals,vectors( ticks/insects), new medications ,clothing or topical cosmetics, travel & environmental  factors           Review of Systems Constitutional: no fever ; chills ; excessive fatigue. Purposeful was 15# change in weight Allergy/immunologic: no sneezing ; rhinitis ; angioedema ;or itchy eyes. Eyes: no blurred/double/loss of vision  ; redness ; discharge Ears, nose and throat/mouth: no nasal congestion ; sore throat ; earache ;  facial pain ;frontal headache  Respirations:no cough ; sputum  ; dyspnea; wheezing GI:no nausea/vomiting ; diarrhea ; abdominal pain  GU: no dysuria ; discharge  Musculoskeletal: no joint swelling or redness  Neurologic:no numbness or tingling  Endocrine:no change in hair, skin, skin, nails otherwise Hematologic/lymphatic:no abnormal bruising or bleeding ; enlarged lymph nodes       Objective:   Physical Exam General appearance:well nourished; no acute distress or increased work of breathing is present.  No  lymphadenopathy about the head, neck, or axilla noted.   Eyes: No conjunctival inflammation or lid edema is present.   Ears:  External ear exam shows no significant lesions or deformities.  Otoscopic examination reveals clear canals, tympanic membranes are intact bilaterally without bulging, retraction, inflammation or discharge.  Nose:  External nasal examination shows no  deformity or inflammation. R nasal mucosa boggy  ; some epistaxis L nare. Bilateral obstruction to airflow.  Hyponasal speech  Oral exam: Dental hygiene is good; lips and gums are healthy appearing.There is mild oropharyngeal erythema ; no exudate noted.   Neck:  No deformities,  masses, or tenderness noted.     Heart:  Normal rate and regular rhythm. S1 and S2 normal without gallop,  click, rub or other extra sounds. Grade 1/6 systolic murmur   Lungs:Chest clear to auscultation; no wheezes, rhonchi,rales ,or rubs present.No increased work of breathing.    Extremities:  No cyanosis or clubbing  noted . 1/2+ edema   Skin: Warm & dry w/o jaundice or tenting. Dermatographia inducable            Assessment & Plan:   #1 urticaria Plan: See orders and recommendations

## 2013-01-18 ENCOUNTER — Encounter: Payer: Self-pay | Admitting: Cardiovascular Disease

## 2013-01-18 ENCOUNTER — Ambulatory Visit (INDEPENDENT_AMBULATORY_CARE_PROVIDER_SITE_OTHER): Payer: Medicare Other | Admitting: Cardiovascular Disease

## 2013-01-18 ENCOUNTER — Telehealth: Payer: Self-pay | Admitting: Cardiovascular Disease

## 2013-01-18 VITALS — BP 158/80 | HR 54 | Ht 73.0 in | Wt 281.4 lb

## 2013-01-18 DIAGNOSIS — Z951 Presence of aortocoronary bypass graft: Secondary | ICD-10-CM

## 2013-01-18 DIAGNOSIS — I4891 Unspecified atrial fibrillation: Secondary | ICD-10-CM

## 2013-01-18 DIAGNOSIS — E785 Hyperlipidemia, unspecified: Secondary | ICD-10-CM

## 2013-01-18 DIAGNOSIS — I10 Essential (primary) hypertension: Secondary | ICD-10-CM

## 2013-01-18 MED ORDER — LOSARTAN POTASSIUM-HCTZ 50-12.5 MG PO TABS: 1.0000 | ORAL_TABLET | Freq: Every day | ORAL | Status: DC

## 2013-01-18 MED ORDER — AMLODIPINE BESYLATE 5 MG PO TABS: 5.0000 mg | ORAL_TABLET | Freq: Every day | ORAL | Status: DC

## 2013-01-18 NOTE — Telephone Encounter (Signed)
Go back to losartin and HCTZ  Add norvasc 5 mg daily

## 2013-01-18 NOTE — Assessment & Plan Note (Signed)
Maint NSR with no palpitatins

## 2013-01-18 NOTE — Telephone Encounter (Signed)
PT AWARE  NORVASC  SENT VIA EPIC  TO PHARMACY .Zack Seal

## 2013-01-18 NOTE — Assessment & Plan Note (Signed)
Stable with no angina and good activity level.  Continue medical Rx  

## 2013-01-18 NOTE — Telephone Encounter (Signed)
PT IS ALREADY TAKING LOSARTAN 100 MG  AT OFFICE VISIT TODAY  MEDS WERE CHANGED  TO START ON  HYZAAR 50 /12.5 MG  AND STOP  HCTZ AND  METOPROLOL WILL FORWARD TO DR Eden Emms FOR REVIEW .Zack Seal

## 2013-01-18 NOTE — Telephone Encounter (Signed)
New problem       Pt has a question about a medication that was changed

## 2013-01-18 NOTE — Progress Notes (Signed)
Patient ID: Steven Munoz, male   DOB: [birth year], 72 y.o.   MRN: [id number] [place] is S/P CABG with periop afib in 05/2009 . He has recovered well. He has good LV function. He finished caardiac rehab. There has been no SSCP, palpitations, PND, orthopnea, and only trace edema . He is compliant with his meds for BP and cholesterol. His BP is borderline and he had LE edema. HCTZ was added to his meds in March 2012 . He has had improvement in his BP and edema. He has to stick with a low carb diet as his weight is up again  Right hip arthritis sees Charlann Boxer Has had two cortisone injections. 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  All other systems reviewed and negative  General: Affect appropriate Obese white male HEENT: normal Neck supple with no adenopathy JVP normal no bruits no thyromegaly Lungs clear with no wheezing and good diaphragmatic motion Heart:  S1/S2 no murmur, no rub, gallop or click PMI normal Abdomen: benighn, BS positve, no tenderness, no AAA no bruit.  No HSM or HJR Distal pulses intact with no bruits Trace bilateral edema Neuro non-focal Skin warm and dry No muscular weakness   Current Outpatient Prescriptions  Medication Sig Dispense Refill  . aspirin 325 MG tablet Take 325 mg by mouth daily.        . Coenzyme Q10 (COQ10) 100 MG CAPS Take by mouth. 1 po daily       . fluticasone (FLONASE) 50 MCG/ACT nasal spray Place 2 sprays into the nose daily.  16 g  6  . FOLIC ACID PO Take 600 mg by mouth daily.        . hydrochlorothiazide (MICROZIDE) 12.5 MG capsule Take 1 capsule (12.5 mg total) by mouth daily.  90 capsule  3  . hydrOXYzine (ATARAX/VISTARIL) 10 MG tablet Take 1 tablet (10 mg total) by mouth 3 (three) times daily as needed for  itching.  30 tablet  0  . levothyroxine (SYNTHROID, LEVOTHROID) 25 MCG tablet 1 po daily except 1 and 1/2 tab tues,thurs,sat  122 tablet  3  . losartan (COZAAR) 100 MG tablet Take 1 tablet (100 mg total) by mouth daily.  90 tablet  3  . Magnesium 200 MG TABS Take 400 mg by mouth. 1 po daily      . metoprolol tartrate (LOPRESSOR) 25 MG tablet 1/2 po bid  90 tablet  3  . Multiple Vitamin (MULTIVITAMIN) tablet Take 1 tablet by mouth daily.        Marland Kitchen omeprazole (PRILOSEC) 20 MG capsule Take 1 capsule (20 mg total) by mouth daily.  90 capsule  3  . ranitidine (ZANTAC) 150 MG tablet Bid in place Omeprazole if hives present  60 tablet  2  . rosuvastatin (CRESTOR) 20 MG tablet Take 1 tablet (20 mg total) by mouth daily.  90 tablet  3  . scopolamine (TRANSDERM-SCOP) 1.5 MG Place 1 patch (1.5 mg total) onto the skin every 3 (three) days.  8 patch  0   No current facility-administered medications for this visit.    Allergies  Povidone and Ofloxacin  Electrocardiogram: NSR rate 54 PR 218 otherwise normal ECG  Assessment and Plan

## 2013-01-18 NOTE — Patient Instructions (Signed)
Your physician recommends that you schedule a follow-up appointment in:  3 MONTHS WITH DR [facility]  Your physician has recommended you make the following change in your medication: STOP METOPROLOL START HYZAAR  50/12.5 MG  1 EVERY DAY  Your physician recommends that you return for lab work in:   4 WEEKS BMET  DX V58.69

## 2013-01-18 NOTE — Assessment & Plan Note (Signed)
Stop beta blocker due to relative bradycardia and first degree block Start hyzaar and check BMET in 4 weeks F/U 3 months

## 2013-01-18 NOTE — Assessment & Plan Note (Signed)
Cholesterol is at goal.  Continue current dose of statin and diet Rx.  No myalgias or side effects.  F/U  LFT's in 6 months. Lab Results  Component Value Date   LDLCALC 71 05/29/2012             

## 2013-01-18 NOTE — Addendum Note (Signed)
Addended by: Scherrie Bateman E on: 01/18/2013 09:47 AM   Modules accepted: Orders, Medications

## 2013-01-25 ENCOUNTER — Other Ambulatory Visit: Payer: Self-pay | Admitting: Orthopedic Surgery

## 2013-01-25 DIAGNOSIS — M25551 Pain in right hip: Secondary | ICD-10-CM

## 2013-01-27 ENCOUNTER — Ambulatory Visit
Admission: RE | Admit: 2013-01-27 | Discharge: 2013-01-27 | Disposition: A | Discharge status: Home / Self Care | Payer: Medicare Other | Source: Ambulatory Visit | Attending: Orthopedic Surgery | Admitting: Orthopedic Surgery

## 2013-01-27 DIAGNOSIS — M25551 Pain in right hip: Secondary | ICD-10-CM

## 2013-01-27 MED ORDER — IOHEXOL 180 MG/ML  SOLN
1.0000 mL | Freq: Once | INTRAMUSCULAR | Status: AC | PRN
  Administered 2013-01-27: 1 mL via INTRA_ARTICULAR

## 2013-01-27 MED ORDER — METHYLPREDNISOLONE ACETATE 40 MG/ML INJ SUSP (RADIOLOG
120.0000 mg | Freq: Once | INTRAMUSCULAR | Status: AC
  Administered 2013-01-27: 120 mg via INTRA_ARTICULAR

## 2013-01-28 ENCOUNTER — Telehealth: Payer: Self-pay | Admitting: Cardiovascular Disease

## 2013-01-28 DIAGNOSIS — I10 Essential (primary) hypertension: Secondary | ICD-10-CM

## 2013-01-28 MED ORDER — HYDROCHLOROTHIAZIDE 12.5 MG PO CAPS: 12.5000 mg | ORAL_CAPSULE | Freq: Every day | ORAL | Status: DC

## 2013-01-28 NOTE — Telephone Encounter (Signed)
New Prob    Dr. Harless Litten BP medication to help lower it and increase pulse rate. Now pulse seems to be extremely high to pt. He is concerned and would like to speak to nurse regarding this.

## 2013-01-28 NOTE — Telephone Encounter (Signed)
Patient states on 01/18/13 at office visit he had medication changes re: discontinue Lopressor, HCTZ and started on Hyzaar. He was already on Losartan 100mg  daily.  This was noted and then the following medication order was changed so that the patient was to take Losartan 100mg  daily, HCTZ 12.5mg  daily and Norvasc 5mg  daily AND to discontinue the order for Hyzaar.     Patient now stating that since the visit and medication changes he has noticed steadily increasing BP and HR that makes him feel "jittery", but he denies further symptomology of dizziness, flushing, chest pain or HR irregularities.  He does report that he has had no improvement in his mild LE swelling, although he has not taken a recent weight.  He further states that he has been taking the Losartan and the Norvasc but it is unclear if he has resumed HCTZ.  (Today: Hyzaar was discontinued from current medication list and HCTZ was re-ordered.)  Patient was instructed to take BP tonight and in the morning, along with a current weight. RN will call him tomorrow to check on current status of vital signs. In addition, patient would like MD to address continued mild LE swelling and his desire to start back on exercise (lifting weights, treadmill, et al). Patient agreed with plans and verbalized that he understood to call office back at any time should he have further symptomology or concerns, especially related to any cardiac discomforts or changes in rate or rhythm.

## 2013-01-28 NOTE — Telephone Encounter (Signed)
Go back to his original meds and f/u with me in 4 weeks

## 2013-02-02 NOTE — Telephone Encounter (Signed)
PT AWARE,  APPT MADE WITH DR Eden Emms FOR  03-04-13 AT 9:15 AM .Steven Munoz

## 2013-02-15 ENCOUNTER — Ambulatory Visit (INDEPENDENT_AMBULATORY_CARE_PROVIDER_SITE_OTHER): Payer: Medicare Other | Admitting: *Deleted

## 2013-02-15 DIAGNOSIS — I10 Essential (primary) hypertension: Secondary | ICD-10-CM

## 2013-02-15 LAB — BASIC METABOLIC PANEL
Chloride: 102 mEq/L (ref 96–112)
Creatinine, Ser: 0.9 mg/dL (ref 0.4–1.5)
GFR: 84.84 mL/min (ref >60.00)
Potassium: 3.8 mEq/L (ref 3.5–5.1)

## 2013-03-04 ENCOUNTER — Encounter: Payer: Self-pay | Admitting: Cardiovascular Disease

## 2013-03-04 ENCOUNTER — Ambulatory Visit (INDEPENDENT_AMBULATORY_CARE_PROVIDER_SITE_OTHER): Payer: Medicare Other | Admitting: Cardiovascular Disease

## 2013-03-04 VITALS — BP 122/72 | HR 58 | Ht 73.0 in | Wt 281.0 lb

## 2013-03-04 DIAGNOSIS — I4949 Other premature depolarization: Secondary | ICD-10-CM

## 2013-03-04 DIAGNOSIS — I10 Essential (primary) hypertension: Secondary | ICD-10-CM

## 2013-03-04 DIAGNOSIS — I4891 Unspecified atrial fibrillation: Secondary | ICD-10-CM

## 2013-03-04 DIAGNOSIS — I440 Atrioventricular block, first degree: Secondary | ICD-10-CM | POA: Insufficient documentation

## 2013-03-04 DIAGNOSIS — Z951 Presence of aortocoronary bypass graft: Secondary | ICD-10-CM

## 2013-03-04 DIAGNOSIS — E785 Hyperlipidemia, unspecified: Secondary | ICD-10-CM

## 2013-03-04 NOTE — Assessment & Plan Note (Signed)
Cholesterol is at goal.  Continue current dose of statin and diet Rx.  No myalgias or side effects.  F/U  LFT's in 6 months. Lab Results  Component Value Date   LDLCALC 71 05/29/2012             

## 2013-03-04 NOTE — Progress Notes (Signed)
Patient ID: Bettey Mare, male   DOB: [birth year], 72 y.o.   MRN: [id number] Steven Munoz is S/P CABG with periop afib in 05/2009 . He has recovered well. He has good LV function. He finished caardiac rehab. There has been no SSCP, palpitations, PND, orthopnea, and only trace edema . He is compliant with his meds for BP and cholesterol. His BP is borderline and he had LE edema. HCTZ was added to his meds in March 2012 . He has had improvement in his BP and edema. He has to stick with a low carb diet as his weight is up again Right hip arthritis sees Charlann Boxer Has had two cortisone injections. 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  All other systems reviewed and negative  General: Affect appropriate Healthy:  appears stated age HEENT: normal Neck supple with no adenopathy JVP normal no bruits no thyromegaly Lungs clear with no wheezing and good diaphragmatic motion Heart:  S1/S2 no murmur, no rub, gallop or click PMI normal Abdomen: benighn, BS positve, no tenderness, no AAA no bruit.  No HSM or HJR Distal pulses intact with no bruits No edema Neuro non-focal Skin warm and dry No muscular weakness   Current Outpatient Prescriptions  Medication Sig Dispense Refill  . aspirin 325 MG tablet Take 325 mg by mouth daily.        . Coenzyme Q10 (COQ10) 100 MG CAPS Take by mouth. 1 po daily       . FOLIC ACID PO Take 600 mg by mouth daily.        . hydrochlorothiazide (MICROZIDE) 12.5 MG capsule Take 1 capsule (12.5 mg total) by mouth daily.  90 capsule  3  . levothyroxine (SYNTHROID, LEVOTHROID) 25 MCG tablet 1 po daily except 1 and 1/2 tab tues,thurs,sat  122 tablet  3  . losartan (COZAAR) 100 MG tablet Take 1 tablet (100 mg total) by mouth daily.  90 tablet  3  .  Magnesium 200 MG TABS Take 400 mg by mouth. 1 po daily      . metoprolol tartrate (LOPRESSOR) 25 MG tablet Take 12.5 mg by mouth 2 (two) times daily.      . Multiple Vitamin (MULTIVITAMIN) tablet Take 1 tablet by mouth daily.        . Multiple Vitamins-Minerals (PRESERVISION AREDS PO) Take 2 capsules by mouth daily.      Marland Kitchen omeprazole (PRILOSEC) 20 MG capsule Take 1 capsule (20 mg total) by mouth daily.  90 capsule  3  . rosuvastatin (CRESTOR) 20 MG tablet Take 1 tablet (20 mg total) by mouth daily.  90 tablet  3  . scopolamine (TRANSDERM-SCOP) 1.5 MG Place 1 patch (1.5 mg total) onto the skin every 3 (three) days.  8 patch  0   No current facility-administered medications for this visit.    Allergies  Povidone and Ofloxacin  Electrocardiogram:  SR rate 54  PR 218 otherwise normal  Assessment and Plan

## 2013-03-04 NOTE — Assessment & Plan Note (Signed)
Cholesterol is at goal.  Continue current dose of statin and diet Rx.  No myalgias or side effects.  F/U  LFT's in 6 months. Lab Results  Component Value Date   LDLCALC 71 05/29/2012

## 2013-03-04 NOTE — Assessment & Plan Note (Signed)
Resolved Maint NSR

## 2013-03-04 NOTE — Assessment & Plan Note (Signed)
Stable on ECG with no high grade AV block. May need to change beta blocker in future

## 2013-03-04 NOTE — Patient Instructions (Addendum)
Your physician wants you to follow-up in: YEAR WITH DR NISHAN  You will receive a reminder letter in the mail two months in advance. If you don't receive a letter, please call our office to schedule the follow-up appointment.  Your physician recommends that you continue on your current medications as directed. Please refer to the Current Medication list given to you today. 

## 2013-03-04 NOTE — Assessment & Plan Note (Signed)
Resolved continue low dose beta blocker  

## 2013-03-04 NOTE — Assessment & Plan Note (Signed)
Stable with no angina and good activity level.  Continue medical Rx  

## 2013-04-19 ENCOUNTER — Ambulatory Visit: Payer: Medicare Other | Admitting: Cardiovascular Disease

## 2013-05-11 ENCOUNTER — Other Ambulatory Visit: Payer: Self-pay | Admitting: Orthopedic Surgery

## 2013-05-11 DIAGNOSIS — M25551 Pain in right hip: Secondary | ICD-10-CM

## 2013-05-12 ENCOUNTER — Ambulatory Visit
Admission: RE | Admit: 2013-05-12 | Discharge: 2013-05-12 | Disposition: A | Discharge status: Home / Self Care | Payer: Medicare Other | Source: Ambulatory Visit | Attending: Orthopedic Surgery | Admitting: Orthopedic Surgery

## 2013-05-12 DIAGNOSIS — M25551 Pain in right hip: Secondary | ICD-10-CM

## 2013-05-12 MED ORDER — IOHEXOL 180 MG/ML  SOLN
1.0000 mL | Freq: Once | INTRAMUSCULAR | Status: AC | PRN
  Administered 2013-05-12: 1 mL via INTRA_ARTICULAR

## 2013-05-12 MED ORDER — METHYLPREDNISOLONE ACETATE 40 MG/ML INJ SUSP (RADIOLOG
120.0000 mg | Freq: Once | INTRAMUSCULAR | Status: AC
  Administered 2013-05-12: 120 mg via INTRA_ARTICULAR

## 2013-05-31 ENCOUNTER — Encounter: Payer: Self-pay | Admitting: Internal Medicine

## 2013-05-31 ENCOUNTER — Ambulatory Visit (INDEPENDENT_AMBULATORY_CARE_PROVIDER_SITE_OTHER): Payer: Medicare Other | Admitting: Internal Medicine

## 2013-05-31 VITALS — BP 138/74 | HR 53 | Temp 98.2°F | Resp 12 | Ht 72.5 in | Wt 284.0 lb

## 2013-05-31 DIAGNOSIS — Z86010 Personal history of colonic polyps: Secondary | ICD-10-CM

## 2013-05-31 DIAGNOSIS — I10 Essential (primary) hypertension: Secondary | ICD-10-CM

## 2013-05-31 DIAGNOSIS — E039 Hypothyroidism, unspecified: Secondary | ICD-10-CM

## 2013-05-31 DIAGNOSIS — M109 Gout, unspecified: Secondary | ICD-10-CM

## 2013-05-31 DIAGNOSIS — Z0000 Encounter for general adult medical examination without abnormal findings: Secondary | ICD-10-CM

## 2013-05-31 DIAGNOSIS — Z951 Presence of aortocoronary bypass graft: Secondary | ICD-10-CM

## 2013-05-31 DIAGNOSIS — E785 Hyperlipidemia, unspecified: Secondary | ICD-10-CM

## 2013-05-31 DIAGNOSIS — Z1331 Encounter for screening for depression: Secondary | ICD-10-CM

## 2013-05-31 LAB — CBC WITH DIFFERENTIAL/PLATELET
Basophils Absolute: 0 10*3/uL (ref 0.0–0.1)
Lymphocytes Relative: 31.1 % (ref 12.0–46.0)
Lymphs Abs: 1.7 10*3/uL (ref 0.7–4.0)
Monocytes Relative: 7.8 % (ref 3.0–12.0)
Neutrophils Relative %: 57.5 % (ref 43.0–77.0)
Platelets: 159 10*3/uL (ref 150.0–400.0)
RDW: 12.9 % (ref 11.5–14.6)

## 2013-05-31 LAB — LIPID PANEL
Cholesterol: 151 mg/dL (ref 0–200)
LDL Cholesterol: 78 mg/dL (ref 0–99)
VLDL: 21.8 mg/dL (ref 0.0–40.0)

## 2013-05-31 LAB — HEPATIC FUNCTION PANEL
AST: 27 U/L (ref 0–37)
Alkaline Phosphatase: 58 U/L (ref 39–117)
Total Bilirubin: 1.5 mg/dL — ABNORMAL HIGH (ref 0.3–1.2)

## 2013-05-31 LAB — BASIC METABOLIC PANEL
GFR: 94.04 mL/min (ref >60.00)
Glucose, Bld: 93 mg/dL (ref 70–99)
Potassium: 3.8 mEq/L (ref 3.5–5.1)
Sodium: 140 mEq/L (ref 135–145)

## 2013-05-31 LAB — TSH: TSH: 3.5 u[IU]/mL (ref 0.35–5.50)

## 2013-05-31 NOTE — Progress Notes (Signed)
Subjective:    Patient ID: Steven Munoz, male    DOB: 1941/04/16, 72 y.o.   MRN: [id number]  HPI Medicare Wellness Visit:  Psychosocial & medical history were reviewed as required by Medicare (abuse,antisocial behavioral risks,firearm risk).  Social history: caffeine:none  , alcohol: 5 / week  ,  tobacco use:  Quit 1968 Exercise :  See below Home & personal  safety / fall risk: no Limitations of activities of daily living:no Seatbelt  and smoke alarm use:yes Power of Attorney/Living Will status : in place Ophthalmology exam status :current Hearing evaluation status: not current Orientation :oriented X 3 Memory & recall :good Spelling or math testing:good Active depression / anxiety:denied Foreign travel history :  [place] 2010 Immunization status for Shingles /Flu/ PNA/ tetanus : Shingles due Transfusion history:  no Preventive health surveillance status of colonoscopy as per protocol/ SOC: due Dental care:  Every 3 mos Chart reviewed &  Updated. Active issues reviewed & addressed.      Review of Systems HYPERTENSION: Disease Monitoring: Blood pressure range  128/68- 140 /< 90 Compliant with present antihypertensive regimen   Metabolic X Syndrome : Disease Monitoring: Blood Sugar: no monitor  Diet: low carb  Exercise : very irregular Ophth exam current Podiatry exam not current   HYPERLIPIDEMIA: Diet & exercise as noted Medication Compliance:  Statin taken  FH premature CAD in brother   Past medical history/family history/social history were all reviewed and updated.    Signs & symptoms not present or negative as noted below: No significant headaches No chest pain, palpitations , claudications, PNDyspnea    No exertional dyspnea No lightheadedness or syncope   No edema No polyuria/phagia/dipsia except nocturia 1-2 X / night No limb numbness & tingling or weakness No non healing skin lesions      No blurred , double or loss of vision No abd pain, bowel  changes No significant myalgias but R hip intra articular steroids X 3                Objective:   Physical Exam Gen.:  well-nourished in appearance; weight excess. Alert, appropriate and cooperative throughout exam.  Head: Normocephalic without obvious abnormalities; hair very fine  Eyes: No corneal or conjunctival inflammation noted. Ptosis OD> OS. Extraocular motion intact. Vision grossly normal with lenses Ears: External  ear exam reveals no significant lesions or deformities. Canals clear .TMs normal. Hearing is grossly normal bilaterally. Nose: External nasal exam reveals no deformity or inflammation. Nasal mucosa are pink and moist. No lesions or exudates noted.   Mouth: Oral mucosa and oropharynx reveal no lesions or exudates. Teeth in good repair. Neck: No deformities, masses, or tenderness noted. Range of motion decreased. Thyroid small. Lungs: Normal respiratory effort; chest expands symmetrically. Lungs are clear to auscultation without rales, wheezes, or increased work of breathing. Heart: Normal rate and rhythm. Normal S1 and S2. No gallop, click, or rub. Grade 1/6 systolic murmur L base. Abdomen: Bowel sounds normal; abdomen soft and nontender. No masses, organomegaly or hernias noted.Protuberant Genitalia: Genitalia normal except for left varices. Prostate is normal without enlargement, asymmetry, nodularity, or induration.                                    Musculoskeletal/extremities: No deformity or scoliosis noted of  the thoracic or lumbar spine.  No clubbing, cyanosis, edema, or significant extremity  deformity noted. Range of motion normal .Tone &  strength  Normal. Joints reveal mild DIP DJD changes & fusiform knees with crepitus. Fingernail health good. Able to lie down & sit up w/o help. Negative SLR bilaterally Vascular: Carotid, radial artery, dorsalis pedis and  posterior tibial pulses are full and equal. No bruits present. Neurologic: Alert and oriented  x3. Deep tendon reflexes symmetrical and normal.         Skin: Intact without suspicious lesions or rashes. Lymph: No cervical, axillary, or inguinal lymphadenopathy present. Psych: Mood and affect are normal. Normally interactive                                                                                       Assessment & Plan:  #1 Medicare Wellness Exam; criteria met ; data entered #2 Problem List/Diagnoses reviewed Plan:  Assessments made/ Orders entered

## 2013-05-31 NOTE — Patient Instructions (Addendum)
As per the Standard of Care , screening Colonoscopy recommended every 5 years based  on adenomatous polyps @ Colonoscopy in 2009. Share results with all non [place] medical staff seen

## 2013-06-09 ENCOUNTER — Other Ambulatory Visit: Payer: Self-pay | Admitting: *Deleted

## 2013-06-09 DIAGNOSIS — I10 Essential (primary) hypertension: Secondary | ICD-10-CM

## 2013-06-09 MED ORDER — ROSUVASTATIN CALCIUM 20 MG PO TABS: 20.0000 mg | ORAL_TABLET | Freq: Every day | ORAL | Status: DC

## 2013-06-09 MED ORDER — HYDROCHLOROTHIAZIDE 12.5 MG PO CAPS: 12.5000 mg | ORAL_CAPSULE | Freq: Every day | ORAL | Status: DC

## 2013-06-09 MED ORDER — LOSARTAN POTASSIUM 100 MG PO TABS: 100.0000 mg | ORAL_TABLET | Freq: Every day | ORAL | Status: DC

## 2013-06-09 MED ORDER — METOPROLOL TARTRATE 25 MG PO TABS: ORAL_TABLET | ORAL | Status: DC

## 2013-06-09 MED ORDER — OMEPRAZOLE 20 MG PO CPDR: 20.0000 mg | DELAYED_RELEASE_CAPSULE | Freq: Every day | ORAL | Status: DC

## 2013-06-09 MED ORDER — LEVOTHYROXINE SODIUM 25 MCG PO TABS: ORAL_TABLET | ORAL | Status: DC

## 2013-06-09 NOTE — Telephone Encounter (Signed)
All Rx's have been filled for patient metoprolol,omeprazole,crestor,levothyroxine,hydrochlorothiazide, losartan.  Ag cma

## 2013-08-03 ENCOUNTER — Other Ambulatory Visit: Payer: Self-pay | Admitting: Orthopedic Surgery

## 2013-08-03 DIAGNOSIS — M25551 Pain in right hip: Secondary | ICD-10-CM

## 2013-08-04 ENCOUNTER — Ambulatory Visit
Admission: RE | Admit: 2013-08-04 | Discharge: 2013-08-04 | Disposition: A | Discharge status: Home / Self Care | Payer: Medicare Other | Source: Ambulatory Visit | Attending: Orthopedic Surgery | Admitting: Orthopedic Surgery

## 2013-08-04 DIAGNOSIS — M25551 Pain in right hip: Secondary | ICD-10-CM

## 2013-08-04 MED ORDER — IOHEXOL 180 MG/ML  SOLN: 1.0000 mL | Freq: Once | INTRAMUSCULAR | Status: AC | PRN

## 2013-08-04 MED ORDER — METHYLPREDNISOLONE ACETATE 40 MG/ML INJ SUSP (RADIOLOG: 120.0000 mg | Freq: Once | INTRAMUSCULAR | Status: DC

## 2013-08-29 LAB — HM COLONOSCOPY

## 2013-08-31 ENCOUNTER — Ambulatory Visit (INDEPENDENT_AMBULATORY_CARE_PROVIDER_SITE_OTHER): Payer: Medicare Other

## 2013-08-31 DIAGNOSIS — Z23 Encounter for immunization: Secondary | ICD-10-CM

## 2013-09-06 ENCOUNTER — Other Ambulatory Visit: Payer: Self-pay | Admitting: Internal Medicine

## 2013-09-06 NOTE — Telephone Encounter (Signed)
Transderm-scop refilled

## 2013-09-17 ENCOUNTER — Ambulatory Visit (INDEPENDENT_AMBULATORY_CARE_PROVIDER_SITE_OTHER): Payer: Medicare Other

## 2013-09-17 VITALS — BP 146/84 | HR 57 | Resp 18

## 2013-09-17 DIAGNOSIS — L600 Ingrowing nail: Secondary | ICD-10-CM

## 2013-09-17 DIAGNOSIS — B351 Tinea unguium: Secondary | ICD-10-CM

## 2013-09-17 MED ORDER — CLINDAMYCIN HCL 150 MG PO CAPS: 150.0000 mg | ORAL_CAPSULE | Freq: Four times a day (QID) | ORAL | Status: DC

## 2013-09-17 NOTE — Progress Notes (Signed)
  Subjective:    Patient ID: Steven Munoz, male    DOB: 20-May-1941, 72 y.o.   MRN: [id number]  HPIi had all my toenails removed and the big toenails are growing back some nails and hurts some with shoes and has cut on them several times    Review of Systems  Constitutional: Negative.   HENT: Negative.   Eyes: Negative.   Respiratory: Negative.   Cardiovascular: Negative.   Gastrointestinal: Negative.   Endocrine: Negative.   Genitourinary: Negative.   Musculoskeletal:       Joint pain  Skin: Negative.   Allergic/Immunologic: Negative.   Neurological: Negative.   Hematological: Negative.   Psychiatric/Behavioral: Negative.        Objective:   Physical Exam Neurovascular status is intact with pedal pulses palpable bilateral epicritic and proprioceptive sensations intact bilateral patient had previous nail excisions of all his toes has had recurrence and some spicules mediolateral borders of both hallux have spicules which are painful tender and symptomatic. Patient is requesting reexcision of the nail spicules of both hallux secondary to regrowth and onychomycosis and dystrophy of nails the lateral nails also some have some thickening and or recurrence of dystrophic nail growth. Although those are not symptomatic are painful. 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     Assessment & Plan:  Plan at this time is for reexcision of mycotic dystrophic nail spicules bilateral hallux. Diagnosis is onychomycosis and regrowth of nail spicules ingrowing nails bilateral great toes. At this time local anesthetic administered to each hallux total 3 cc 50-50 mixture of 2% Xylocaine plain and 0.5 some Marcaine plain. An additional 3 cc of 2% Xylocaine plain was added to the right great toe for complete anesthesia. At this time digital  tourniquets were applied Betadine prep was performed and the nail spicules were excised phenol H. discectomy followed by alcohol wash Silvadene and gauze applications first the left and the right hallux. Should note that patient did have some excessive bleeding as he is on aspirin therapy which was not discontinued. Patient was advised in appropriate daily soaking in soap and water or Epsom salts in warm water prescription for clindamycin was prescribed recommended Tylenol as needed for pain recheck in 2-3 weeks for followup for nail check and evaluation advised to contact us me change difficulties noted in the interim.  [name] DPM

## 2013-09-17 NOTE — Patient Instructions (Signed)

## 2013-09-28 ENCOUNTER — Encounter: Payer: Self-pay | Admitting: Pulmonary Disease

## 2013-09-28 ENCOUNTER — Ambulatory Visit (INDEPENDENT_AMBULATORY_CARE_PROVIDER_SITE_OTHER): Payer: Medicare Other | Admitting: Pulmonary Disease

## 2013-09-28 VITALS — BP 114/64 | HR 61 | Temp 97.7°F | Ht 72.5 in | Wt 287.2 lb

## 2013-09-28 DIAGNOSIS — G4730 Sleep apnea, unspecified: Secondary | ICD-10-CM

## 2013-09-28 NOTE — Progress Notes (Signed)
Subjective:    Patient ID: Steven Munoz, male    DOB: 04-21-1941, 72 y.o.   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  PSG (289 Lbs) 2009 -showed  severe OSA with predominant hypopneas, AHI 31/h corrected by CPAP +8 with large quattro mask   PLms seemed to improve with titration   09/28/2013  Last seen oct 2010 Epworth sleepiness score is 3/24. He does not have problems while driving. He drinks only decaffeinated drinks   Bedtime is 11:30 PM, sleep latency is 15-30 minutes. He awakens 1-2 times mostly for bathroom visits without any postvoid sleep latency. He gets out of bed at 6:30 AM feeling rested without dryness of mouth or morning headaches.  He lost some wt after CABG but is now back up to 287 lbs - close to his wt during study No snoring, w. apneas, feels refreshed. Mask ok, pressure ok. Reports good compliance with CPAP He underwent sinus surgery jan 2014  Past Medical History  Diagnosis Date  . Murmur   . Hypothyroidism   . Hyperlipidemia   . Hypertension   . Diverticulosis   . GERD (gastroesophageal reflux disease)     S/P dilation  2005  . Sleep apnea     on CPAP; Dr Vassie Loll  . Dermatophytosis of nail   . Dysmetabolic syndrome   . Gout   . Colonic polyp 2002, 2005, 2009    Dr Kinnie Scales  . 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 Nephrolithiasis     X 1  . Pneumonia age 13    treated as inpatient    Past Surgical History  Procedure Laterality Date  . Coronary artery bypass graft  2010    Median sternotomy, extracorporeal circulation, coronary bypass graft surgery x3 using a left internal mammary artery graft to left anterior descending coronary artery, with a sequential  saphenous vein graft to posterior descending and posterolateral branches  of the right coronary artery.  Endoscopic vein harvesting from the right  .  Athroscopy      right knee X2, left knee X1  . Colonoscopy  2002 & 2009    polypectomy; Dr Kinnie Scales  . Replacement total knee      right 2003  . Replacement total knee      partial Left March 2009  . Nose surgery  11/20/12    L max antrectomy;septoplasty;turbinate reduction. Dr Monte Fantasia, Kathie Dike    Allergies  Allergen Reactions  . Povidone     REACTION: rash locally  . Ofloxacin     Affected sleep with eye drop formulation    History   Social History  . Marital Status: Married    Spouse Name: N/A    Number of Children: 2  . Years of Education: N/A   Occupational History  . ACCOUNTANT [employer]    Social History Main Topics  . Smoking status: Former Smoker -- 1.00 packs/day for 6 years    Types: Cigarettes    Quit date: 10/28/1966  . Smokeless tobacco: Never Used     Comment: smoked 1962- 1968, up to 1 ppd  . Alcohol Use: 3.0 oz/week    5 Glasses of wine per week     Comment: socially  . Drug Use: No  . Sexual Activity: Not on file   Other Topics Concern  . Not on file  Social History Narrative  . No narrative on file    Family History  Problem Relation Age of Onset  . Lung cancer Mother     liver cancer, smoker, TIA  . Hypertension Mother   . Transient ischemic attack Mother   . Lung cancer Mother   . Heart attack Father 42  . Heart attack Brother 55  . Diabetes Neg Hx      Review of Systems  Constitutional: Positive for unexpected weight change. Negative for fever.  HENT: Positive for sneezing. Negative for congestion, dental problem, ear pain, nosebleeds, postnasal drip, rhinorrhea, sinus pressure, sore throat and trouble swallowing.   Eyes: Negative for redness and itching.  Respiratory: Negative for cough, chest tightness, shortness of breath and wheezing.   Cardiovascular: Negative for palpitations and leg swelling.  Gastrointestinal: Negative for nausea and vomiting.  Genitourinary: Negative for dysuria.  Musculoskeletal: Positive for  arthralgias. Negative for joint swelling.  Skin: Negative for rash.  Neurological: Negative for headaches.  Hematological: Does not bruise/bleed easily.  Psychiatric/Behavioral: Negative for dysphoric mood. The patient is not nervous/anxious.        Objective:   Physical Exam  Gen. Pleasant, obese, in no distress ENT - no lesions, no post nasal drip Neck: No JVD, no thyromegaly, no carotid bruits Lungs: no use of accessory muscles, no dullness to percussion, decreased without rales or rhonchi  Cardiovascular: Rhythm regular, heart sounds  normal, no murmurs or gallops, no peripheral edema Musculoskeletal: No deformities, no cyanosis or clubbing , no tremors        Assessment & Plan:

## 2013-09-28 NOTE — Patient Instructions (Signed)
We will check download on your machine Provide you with CPAP supplies Let us know the name of your DME

## 2013-09-28 NOTE — Assessment & Plan Note (Signed)
We will check download on your machine Provide you with CPAP supplies Let us know the name of your DME  Weight loss encouraged, compliance with goal of at least 4-6 hrs every night is the expectation. Advised against medications with sedative side effects Cautioned against driving when sleepy - understanding that sleepiness will vary on a day to day basis

## 2013-10-05 ENCOUNTER — Ambulatory Visit (INDEPENDENT_AMBULATORY_CARE_PROVIDER_SITE_OTHER): Payer: Medicare Other

## 2013-10-05 ENCOUNTER — Other Ambulatory Visit: Payer: Self-pay

## 2013-10-05 ENCOUNTER — Telehealth: Payer: Self-pay | Admitting: *Deleted

## 2013-10-05 VITALS — BP 146/81 | HR 49 | Resp 24 | Ht 72.5 in | Wt 273.0 lb

## 2013-10-05 DIAGNOSIS — M21619 Bunion of unspecified foot: Secondary | ICD-10-CM

## 2013-10-05 DIAGNOSIS — M21612 Bunion of left foot: Secondary | ICD-10-CM

## 2013-10-05 DIAGNOSIS — M109 Gout, unspecified: Secondary | ICD-10-CM

## 2013-10-05 MED ORDER — MELOXICAM 15 MG PO TABS: 15.0000 mg | ORAL_TABLET | Freq: Every day | ORAL | Status: DC

## 2013-10-05 NOTE — Patient Instructions (Signed)

## 2013-10-05 NOTE — Telephone Encounter (Signed)
Pt hand carried paperwork for arthritis panel to [facility].

## 2013-10-05 NOTE — Progress Notes (Signed)
   Subjective:    Patient ID: Steven Munoz, male    DOB: Oct 11, 1941, 72 y.o.   MRN: [id number] "They're fine.  I think I may have a flare up of Gout on the left big toe."  HPI Comments: N  Throb, very sore, red L  Hallux Lt. Painful D  3 weeks O  suddenly C  Gotten little better A  Touch, walking T  None       Review of Systems  Respiratory: Positive for apnea.   Musculoskeletal: Positive for arthralgias.  All other systems reviewed and are negative.       Objective:   Physical Exam Neurovascular status is intact pedal pulses double. There no varicosities. Patient is status post AP nail procedures both hallux medial lateral borders with redo they're healing well no discharge or drainage noted. Patient is leaning complaint this time is having pain swelling and redness first MTP area left foot. Does have a history of gout in the past. 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 No history of injury trauma noted       Assessment & Plan:  Assessment good postop progress resolution following AP nail procedure spicules having been removed. 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 Has had gout attacks in the past at this time prescription for Mobic 15 mg is dispensed 1 daily when necessary pain we'll followup with him next month if symptoms persist or we'll contact patient with results of arthritis panel uric acid testing. May be candidate for allopurinol or other agents as indicated based on findings.  [name] DPM

## 2013-10-06 LAB — OTHER SOLSTAS TEST
Anti Nuclear Antibody(ANA): NEGATIVE
CRP: <0.5 mg/dL
Rheumatoid fact SerPl-aCnc: <10 [IU]/mL
Sed Rate: 1 mm/h (ref 0–16)
Uric Acid, Serum: 6.4 mg/dL (ref 4.0–7.8)

## 2013-10-08 ENCOUNTER — Telehealth: Payer: Self-pay | Admitting: Pulmonary Disease

## 2013-10-08 NOTE — Telephone Encounter (Signed)
lmtcb x1 w/ spouse I have called [facility] to get download-spoke with sharon. She will fax this triage

## 2013-10-08 NOTE — Telephone Encounter (Signed)
Download received and placed in RA look at for Monday. Will forward to Dr. Vassie Loll so he is aware.

## 2013-10-12 NOTE — Telephone Encounter (Signed)
Download 09/27/13 - on 8 cm - good usage Few residual events - CPAP very effective Minor leak  Can he get better seal on mask ? No changes otherwise

## 2013-10-14 NOTE — Telephone Encounter (Signed)
lmomtcb x1 for pt 

## 2013-10-15 NOTE — Telephone Encounter (Signed)
I spoke with patient about results and he verbalized understanding and had no questions 

## 2013-11-05 ENCOUNTER — Ambulatory Visit: Payer: Medicare Other

## 2013-11-12 ENCOUNTER — Ambulatory Visit (INDEPENDENT_AMBULATORY_CARE_PROVIDER_SITE_OTHER): Payer: Medicare HMO

## 2013-11-12 VITALS — BP 175/89 | HR 50 | Resp 18 | Ht 73.0 in | Wt 269.0 lb

## 2013-11-12 DIAGNOSIS — M21619 Bunion of unspecified foot: Secondary | ICD-10-CM

## 2013-11-12 DIAGNOSIS — M21612 Bunion of left foot: Secondary | ICD-10-CM

## 2013-11-12 DIAGNOSIS — M109 Gout, unspecified: Secondary | ICD-10-CM

## 2013-11-12 NOTE — Patient Instructions (Signed)

## 2013-11-12 NOTE — Progress Notes (Signed)
   Subjective:    Patient ID: Steven Munoz, male    DOB: 1941-06-10, 73 y.o.   MRN: [id number] Pt presents for follow up of gouty symptoms and labs. HPI    Review of Systems  Constitutional: Negative.   HENT: Negative.   Respiratory: Negative.   Gastrointestinal: Negative.   Endocrine: Negative.   Genitourinary: Negative.   Musculoskeletal: Negative.   Skin: Negative.   Neurological: Negative.   Hematological: Negative.   Psychiatric/Behavioral: Negative.   All other systems reviewed and are negative.       Objective:   Physical Exam Neurovascular status is intact pedal pulses palpable bilateral. The nail borders suspect was then remove healed well there is a slight eschar tissue which is falling away on its own at this time. The gout attack has improved patient did have slightly elevated uric capsule 6.4 at last visits lab workup in the past has been up as high as 8.4 at 7.92 years ago when he initial gout attacks. At this time suggested treating symptomatically if there is any recurrence or flareup will use NSAID or ibuprofen or Mobic however he has multiple recurrent attacks may be candidate for a year or long-term agent such as allopurinol or Uloric.       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 Patient doing well postoperatively and as far as nail excisions no pain tenderness discomfort. Patient discharge to an as-needed basis for any future followup for monitoring maintain good hydration  and maintain good gout that diet. Literature to read gouty arthropathy is dispensed for patient reviewing maintain reappointed in the future as needed next  Harriet Masson DPM

## 2013-11-24 ENCOUNTER — Telehealth: Payer: Self-pay | Admitting: Internal Medicine

## 2013-11-24 ENCOUNTER — Other Ambulatory Visit: Payer: Self-pay | Admitting: *Deleted

## 2013-11-24 DIAGNOSIS — I10 Essential (primary) hypertension: Secondary | ICD-10-CM

## 2013-11-24 MED ORDER — LOSARTAN POTASSIUM 100 MG PO TABS: 100.0000 mg | ORAL_TABLET | Freq: Every day | ORAL | Status: DC

## 2013-11-24 MED ORDER — METOPROLOL TARTRATE 25 MG PO TABS: ORAL_TABLET | ORAL | Status: DC

## 2013-11-24 MED ORDER — HYDROCHLOROTHIAZIDE 12.5 MG PO CAPS: 12.5000 mg | ORAL_CAPSULE | Freq: Every day | ORAL | Status: DC

## 2013-11-24 MED ORDER — ROSUVASTATIN CALCIUM 20 MG PO TABS: 20.0000 mg | ORAL_TABLET | Freq: Every day | ORAL | Status: DC

## 2013-11-24 MED ORDER — LEVOTHYROXINE SODIUM 25 MCG PO TABS: ORAL_TABLET | ORAL | Status: DC

## 2013-11-24 NOTE — Telephone Encounter (Signed)
Pt came by today and is requesting his meds be sent to him via mail order through [facility] home delivery.  He stated that fax number is [phone].  He is requesting 90 day refills on scripts.  Please advise.

## 2013-11-24 NOTE — Telephone Encounter (Signed)
Done. JG//CMA 

## 2013-12-09 ENCOUNTER — Other Ambulatory Visit: Payer: Self-pay | Admitting: Orthopedic Surgery

## 2013-12-09 DIAGNOSIS — M25551 Pain in right hip: Secondary | ICD-10-CM

## 2013-12-14 ENCOUNTER — Other Ambulatory Visit: Payer: Medicare HMO

## 2013-12-17 ENCOUNTER — Ambulatory Visit
Admission: RE | Admit: 2013-12-17 | Discharge: 2013-12-17 | Disposition: A | Discharge status: Home / Self Care | Payer: Medicare HMO | Source: Ambulatory Visit | Attending: Orthopedic Surgery | Admitting: Orthopedic Surgery

## 2013-12-17 DIAGNOSIS — M25551 Pain in right hip: Secondary | ICD-10-CM

## 2013-12-17 MED ORDER — IOHEXOL 180 MG/ML  SOLN
1.0000 mL | Freq: Once | INTRAMUSCULAR | Status: AC | PRN
  Administered 2013-12-17: 1 mL via INTRA_ARTICULAR

## 2013-12-17 MED ORDER — TRIAMCINOLONE ACETONIDE 40 MG/ML IJ SUSP (RADIOLOGY)
40.0000 mg | Freq: Once | INTRAMUSCULAR | Status: AC
  Administered 2013-12-17: 40 mg via INTRA_ARTICULAR

## 2013-12-27 ENCOUNTER — Telehealth: Payer: Self-pay | Admitting: *Deleted

## 2013-12-27 ENCOUNTER — Telehealth: Payer: Self-pay | Admitting: Internal Medicine

## 2013-12-27 MED ORDER — OSELTAMIVIR PHOSPHATE 75 MG PO CAPS: 75.0000 mg | ORAL_CAPSULE | Freq: Every day | ORAL | Status: DC

## 2013-12-27 NOTE — Telephone Encounter (Signed)
Patient phoned stating that he had been exposed to "type B" flu and is requesting Tamiflu be called in.  Please advise.    [facility] ([place])  CB# [phone]

## 2013-12-27 NOTE — Telephone Encounter (Signed)
Tamiflu 75 mg bid #10

## 2013-12-27 NOTE — Telephone Encounter (Signed)
Re-submitted tamiflu to local pharmacy (sent to [facility] order RX) and notified patient.

## 2013-12-27 NOTE — Telephone Encounter (Signed)
Tamiflu was sent in for this pt, when Tabori sent in the rx for his wife.

## 2013-12-27 NOTE — Telephone Encounter (Addendum)
Patient called and stated that he went to visit his son this weekend and his son found out he had the flu. Patient wanted to see if its possible to send in Tamilfu. preventative for him. Please call patient for the pharmacy he was not very clear on the pharmacy.    Pharmacy [id number] [place], [place]

## 2013-12-30 ENCOUNTER — Ambulatory Visit (INDEPENDENT_AMBULATORY_CARE_PROVIDER_SITE_OTHER): Payer: Medicare HMO | Admitting: Cardiovascular Disease

## 2013-12-30 ENCOUNTER — Encounter: Payer: Self-pay | Admitting: Cardiovascular Disease

## 2013-12-30 VITALS — BP 140/90 | HR 49 | Ht 73.0 in | Wt 268.0 lb

## 2013-12-30 DIAGNOSIS — I440 Atrioventricular block, first degree: Secondary | ICD-10-CM

## 2013-12-30 DIAGNOSIS — I4891 Unspecified atrial fibrillation: Secondary | ICD-10-CM

## 2013-12-30 DIAGNOSIS — E785 Hyperlipidemia, unspecified: Secondary | ICD-10-CM

## 2013-12-30 DIAGNOSIS — Z01810 Encounter for preprocedural cardiovascular examination: Secondary | ICD-10-CM | POA: Insufficient documentation

## 2013-12-30 MED ORDER — METOPROLOL TARTRATE 25 MG PO TABS: 12.5000 mg | ORAL_TABLET | Freq: Every day | ORAL | Status: DC

## 2013-12-30 NOTE — Assessment & Plan Note (Signed)
Cholesterol is at goal.  Continue current dose of statin and diet Rx.  No myalgias or side effects.  F/U  LFT's in 6 months. Lab Results  Component Value Date   LDLCALC 78 05/31/2013             

## 2013-12-30 NOTE — Assessment & Plan Note (Signed)
SSS with distant PAF  Decrease lopressor to daily and follow If HR stays below 60 will d/c all together

## 2013-12-30 NOTE — Progress Notes (Signed)
Patient ID: Steven Munoz, male   DOB: [birth year], 73 y.o.   MRN: [id number] Steven Munoz is S/P CABG with periop afib in 05/2009 . He has recovered well. He has good LV function. . There has been no SSCP, palpitations, PND, orthopnea, and only trace edema . He is compliant with his meds for BP and cholesterol. His BP is borderline and he had LE edema. HCTZ was added to his meds in March 2012 . He has had improvement in his BP and edema. He has to stick with a low carb diet as his weight is up again Right hip arthritis sees Steven Munoz Has had two cortisone injections. BP a bit high and HR a bit low with first degree AV block  8/14 LDL 84 with normal LFTls   Wearing his CPAP followed by Steven Munoz   Needs right hip surgery at [facility] with Dr Steven Munoz   [phone]  Has had right TKR and left partial with no complications Has [employer] and could not be done in [place]     ROS: Denies fever, malais, weight loss, blurry vision, decreased visual acuity, cough, sputum, SOB, hemoptysis, pleuritic pain, palpitaitons, heartburn, abdominal pain, melena, lower extremity edema, claudication, or rash.  All other systems reviewed and negative  General: Affect appropriate Overweight white male  HEENT: normal Neck supple with no adenopathy JVP normal no bruits no thyromegaly Lungs clear with no wheezing and good diaphragmatic motion Heart:  S1/S2 no murmur, no rub, gallop or click PMI normal Abdomen: benighn, BS positve, no tenderness, no AAA no bruit.  No HSM or HJR Distal pulses intact with no bruits  Bilateral knee surgery  No edema Neuro non-focal Skin warm and dry No muscular weakness   Current Outpatient Prescriptions  Medication Sig Dispense Refill  . aspirin 325 MG tablet Take 325 mg by mouth daily.        . Coenzyme Q10 (COQ10) 100 MG CAPS Take by mouth. 1 po daily       . FOLIC ACID PO Take 542 mg by mouth daily.        . hydrochlorothiazide (MICROZIDE) 12.5 MG capsule Take 1 capsule (12.5  mg total) by mouth daily.  90 capsule  1  . levothyroxine (SYNTHROID, LEVOTHROID) 25 MCG tablet 1 po daily except 1 and 1/2 tab tues,thurs,sat  122 tablet  1  . losartan (COZAAR) 100 MG tablet Take 1 tablet (100 mg total) by mouth daily.  90 tablet  1  . magnesium oxide (MAG-OX) 400 MG tablet Take 400 mg by mouth daily.      . meloxicam (MOBIC) 15 MG tablet Take 1 tablet (15 mg total) by mouth daily.  30 tablet  1  . metoprolol tartrate (LOPRESSOR) 25 MG tablet Take 0.5 tablet 12.5 mg by mouth 2 (two) times daily  90 tablet  1  . Multiple Vitamin (MULTIVITAMIN) tablet Take 1 tablet by mouth daily.        . Multiple Vitamins-Minerals (PRESERVISION AREDS PO) Take 2 capsules by mouth daily.      Marland Kitchen omeprazole (PRILOSEC) 20 MG capsule Take 1 capsule (20 mg total) by mouth daily.  90 capsule  1  . oseltamivir (TAMIFLU) 75 MG capsule Take 1 capsule (75 mg total) by mouth daily.  10 capsule  0  . rosuvastatin (CRESTOR) 20 MG tablet Take 1 tablet (20 mg total) by mouth daily.  90 tablet  1   No current facility-administered medications for this visit.    Allergies  Povidone-iodine and Ofloxacin  Electrocardiogram:  SR rate 54  First degree block otherwise normal   Assessment and Plan

## 2013-12-30 NOTE — Assessment & Plan Note (Signed)
Maint NSR  Risk of PAF post op  Would monitor on telemetry

## 2013-12-30 NOTE — Patient Instructions (Signed)
Your physician recommends that you continue on your current medications as directed. Please refer to the Current Medication list given to you today.  We Will fax you clearance letter to Dr Kerby Nora Phone: [phone] Fax: [phone]

## 2013-12-30 NOTE — Assessment & Plan Note (Signed)
CABG 2010 Asymptomatic Good LV clear to have surgery .  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  Decrease ASA to 81 mg post op with anticoagulation

## 2014-02-02 ENCOUNTER — Telehealth: Payer: Self-pay | Admitting: Cardiovascular Disease

## 2014-02-02 NOTE — Telephone Encounter (Signed)
New mesasge  Pt called states his metoprolol was adjusted.. Request a call back to discuss pulse readings.

## 2014-02-02 NOTE — Telephone Encounter (Signed)
Continue 12.5 atenolol and monitor pulse another month

## 2014-02-02 NOTE — Telephone Encounter (Signed)
SPOKE WITH   PT  WAS  RECENTLY   INSTRUCTED TO   DECREASE    METOPROLOL  TO   12.5 MG  DAILY   DUE TO   BRADYCARDIA  AT   12-30-13  OFFICE VISIT  PT  CALLED IN READINGS  RANGING   FROM  47-73  FROM VARIOUS  DATES  STARTING      3/9  TO    02/02/2014 WILL FORWARD TO   DR Johnsie Cancel FOR REVIEW./CY

## 2014-02-04 NOTE — Telephone Encounter (Signed)
PT  NOTIFIED ./CY 

## 2014-02-22 HISTORY — PX: TOTAL HIP ARTHROPLASTY: SHX124

## 2014-03-04 ENCOUNTER — Ambulatory Visit: Payer: Medicare HMO | Admitting: Cardiovascular Disease

## 2014-03-11 DIAGNOSIS — S73004A Unspecified dislocation of right hip, initial encounter: Secondary | ICD-10-CM | POA: Insufficient documentation

## 2014-03-11 DIAGNOSIS — Z96649 Presence of unspecified artificial hip joint: Secondary | ICD-10-CM | POA: Insufficient documentation

## 2014-03-22 ENCOUNTER — Ambulatory Visit: Payer: Medicare HMO | Attending: Physician Assistant

## 2014-03-22 DIAGNOSIS — IMO0001 Reserved for inherently not codable concepts without codable children: Secondary | ICD-10-CM | POA: Insufficient documentation

## 2014-03-22 DIAGNOSIS — M25559 Pain in unspecified hip: Secondary | ICD-10-CM | POA: Insufficient documentation

## 2014-03-22 DIAGNOSIS — R262 Difficulty in walking, not elsewhere classified: Secondary | ICD-10-CM | POA: Insufficient documentation

## 2014-03-22 DIAGNOSIS — M6281 Muscle weakness (generalized): Secondary | ICD-10-CM | POA: Insufficient documentation

## 2014-03-22 DIAGNOSIS — R5381 Other malaise: Secondary | ICD-10-CM | POA: Insufficient documentation

## 2014-03-23 ENCOUNTER — Ambulatory Visit: Payer: Medicare HMO | Admitting: Physical Therapy

## 2014-03-28 ENCOUNTER — Ambulatory Visit: Payer: Medicare HMO | Attending: Physician Assistant

## 2014-03-28 DIAGNOSIS — M6281 Muscle weakness (generalized): Secondary | ICD-10-CM | POA: Insufficient documentation

## 2014-03-28 DIAGNOSIS — R262 Difficulty in walking, not elsewhere classified: Secondary | ICD-10-CM | POA: Insufficient documentation

## 2014-03-28 DIAGNOSIS — Z5189 Encounter for other specified aftercare: Secondary | ICD-10-CM | POA: Insufficient documentation

## 2014-03-28 DIAGNOSIS — M25559 Pain in unspecified hip: Secondary | ICD-10-CM | POA: Insufficient documentation

## 2014-03-30 ENCOUNTER — Ambulatory Visit: Payer: Medicare HMO

## 2014-03-31 ENCOUNTER — Ambulatory Visit: Payer: Medicare HMO

## 2014-04-04 ENCOUNTER — Ambulatory Visit: Payer: Medicare HMO

## 2014-04-06 ENCOUNTER — Ambulatory Visit: Payer: Medicare HMO

## 2014-04-07 ENCOUNTER — Ambulatory Visit (HOSPITAL_COMMUNITY): Payer: Medicare HMO | Attending: Internal Medicine | Admitting: Cardiology

## 2014-04-07 ENCOUNTER — Encounter: Payer: Self-pay | Admitting: Cardiovascular Disease

## 2014-04-07 ENCOUNTER — Ambulatory Visit (INDEPENDENT_AMBULATORY_CARE_PROVIDER_SITE_OTHER): Payer: Medicare HMO | Admitting: Cardiovascular Disease

## 2014-04-07 VITALS — BP 144/78 | HR 84 | Ht 73.0 in | Wt 273.8 lb

## 2014-04-07 DIAGNOSIS — Z951 Presence of aortocoronary bypass graft: Secondary | ICD-10-CM | POA: Insufficient documentation

## 2014-04-07 DIAGNOSIS — M79609 Pain in unspecified limb: Secondary | ICD-10-CM

## 2014-04-07 DIAGNOSIS — Z9889 Other specified postprocedural states: Secondary | ICD-10-CM

## 2014-04-07 DIAGNOSIS — I8390 Asymptomatic varicose veins of unspecified lower extremity: Secondary | ICD-10-CM | POA: Insufficient documentation

## 2014-04-07 DIAGNOSIS — R609 Edema, unspecified: Secondary | ICD-10-CM

## 2014-04-07 DIAGNOSIS — I4891 Unspecified atrial fibrillation: Secondary | ICD-10-CM

## 2014-04-07 DIAGNOSIS — I2510 Atherosclerotic heart disease of native coronary artery without angina pectoris: Secondary | ICD-10-CM | POA: Insufficient documentation

## 2014-04-07 DIAGNOSIS — I10 Essential (primary) hypertension: Secondary | ICD-10-CM

## 2014-04-07 MED ORDER — OMEPRAZOLE 20 MG PO CPDR: 20.0000 mg | DELAYED_RELEASE_CAPSULE | Freq: Every day | ORAL | Status: DC

## 2014-04-07 NOTE — Progress Notes (Signed)
Rt. LE Venous duplex performed. 

## 2014-04-07 NOTE — Assessment & Plan Note (Signed)
Cholesterol is at goal.  Continue current dose of statin and diet Rx.  No myalgias or side effects.  F/U  LFT's in 6 months. Lab Results  Component Value Date   LDLCALC 78 05/31/2013

## 2014-04-07 NOTE — Progress Notes (Signed)
Patient ID: Steven Munoz, male   DOB: 09-21-1941, 73 y.o.   MRN: [id number] Steven Munoz is S/P CABG with periop afib in 05/2009 . He has recovered well. He has good LV function. . There has been no SSCP, palpitations, PND, orthopnea, and only trace edema . He is compliant with his meds for BP and cholesterol. His BP is borderline and he had LE edema. HCTZ was added to his meds in March 2012 . He has had improvement in his BP and edema. He has to stick with a low carb diet as his weight is up again Right hip arthritis sees [name] Has had two cortisone injections. BP a bit high and HR a bit low with first degree AV block  8/14 LDL 84 with normal LFTls  Wearing his CPAP followed by [name]  Had right hip surgery at [facility] with [name]  [phone] [place] op hip dislocated and has not been quite right  Has pain RLE with known varicosities  Only had ASA post op no anticoagulation    ROS: Denies fever, malais, weight loss, blurry vision, decreased visual acuity, cough, sputum, SOB, hemoptysis, pleuritic pain, palpitaitons, heartburn, abdominal pain, melena, lower extremity edema, claudication, or rash.  All other systems reviewed and negative  General: Affect appropriate Overweight white male  HEENT: normal Neck supple with no adenopathy JVP normal no bruits no thyromegaly Lungs clear with no wheezing and good diaphragmatic motion Heart:  S1/S2 no murmur, no rub, gallop or click PMI normal Abdomen: benighn, BS positve, no tenderness, no AAA no bruit.  No HSM or HJR Distal pulses intact with no bruits No edema Neuro non-focal Skin warm and dry S/P right anterior approach hip replacement and R TKR Varicosities pain palpation calf with Plus one bilateral edeme   Current Outpatient Prescriptions  Medication Sig Dispense Refill  . aspirin 325 MG tablet Take 325 mg by mouth daily.        . Coenzyme Q10 (COQ10) 100 MG CAPS Take by mouth. 1 po daily       . FOLIC ACID PO Take 381 mg by mouth  daily.        . hydrochlorothiazide (MICROZIDE) 12.5 MG capsule Take 1 capsule (12.5 mg total) by mouth daily.  90 capsule  1  . Ibuprofen (ADVIL PO) Take by mouth as needed.      Marland Kitchen levothyroxine (SYNTHROID, LEVOTHROID) 25 MCG tablet 1 po daily except 1 and 1/2 tab tues,thurs,sat  122 tablet  1  . losartan (COZAAR) 100 MG tablet Take 1 tablet (100 mg total) by mouth daily.  90 tablet  1  . magnesium oxide (MAG-OX) 400 MG tablet Take 400 mg by mouth daily.      . metoprolol tartrate (LOPRESSOR) 25 MG tablet Take 0.5 tablets (12.5 mg total) by mouth daily.  45 tablet  1  . Multiple Vitamins-Minerals (PRESERVISION AREDS PO) Take 2 capsules by mouth daily.      Marland Kitchen omeprazole (PRILOSEC) 20 MG capsule Take 1 capsule (20 mg total) by mouth daily.  90 capsule  1  . oxyCODONE (OXY IR/ROXICODONE) 5 MG immediate release tablet Take 5 mg by mouth every 6 (six) hours as needed.       . rosuvastatin (CRESTOR) 20 MG tablet Take 1 tablet (20 mg total) by mouth daily.  90 tablet  1  . Sennosides-Docusate Sodium (SENNA S PO) Take by mouth as needed.       No current facility-administered medications  for this visit.    Allergies  Povidone-iodine; Ofloxacin; and Povidone iodine  Electrocardiogram:  SR rate 49  Normal   Assessment and Plan

## 2014-04-07 NOTE — Assessment & Plan Note (Signed)
Stable with no angina and good activity level.  Continue medical Rx  

## 2014-04-07 NOTE — Assessment & Plan Note (Signed)
6 weeks post hip surgery with increased swelling and pain in RLE  Will do venous duplex today Has f/u with surgeon at [facility] [place] Likely more superficial phlebitis

## 2014-04-07 NOTE — Patient Instructions (Signed)
Your physician wants you to follow-up in:   [place] will receive a reminder letter in the mail two months in advance. If you don't receive a letter, please call our office to schedule the follow-up appointment. Your physician recommends that you continue on your current medications as directed. Please refer to the Current Medication list given to you today. Your physician has requested that you have a lower extremity venous duplex. This test is an ultrasound of the veins in the legs . It looks at venous blood flow that carries blood from the heart to the legs . Allow one hour for a Lower Venous exam. There are no restrictions or special instructions.

## 2014-04-07 NOTE — Assessment & Plan Note (Signed)
Maint NSR with no periop arrhyhthmia

## 2014-04-07 NOTE — Assessment & Plan Note (Signed)
Well controlled.  Continue current medications and low sodium Dash type diet.   Tolerating lower dose beta blocker

## 2014-04-11 ENCOUNTER — Encounter (HOSPITAL_COMMUNITY): Payer: Medicare HMO

## 2014-04-12 ENCOUNTER — Ambulatory Visit: Payer: Medicare HMO

## 2014-04-25 ENCOUNTER — Ambulatory Visit: Payer: Medicare HMO

## 2014-04-27 ENCOUNTER — Ambulatory Visit: Payer: Medicare HMO | Attending: Physician Assistant

## 2014-04-27 DIAGNOSIS — R262 Difficulty in walking, not elsewhere classified: Secondary | ICD-10-CM | POA: Insufficient documentation

## 2014-04-27 DIAGNOSIS — M6281 Muscle weakness (generalized): Secondary | ICD-10-CM | POA: Diagnosis not present

## 2014-04-27 DIAGNOSIS — M25559 Pain in unspecified hip: Secondary | ICD-10-CM | POA: Diagnosis not present

## 2014-04-27 DIAGNOSIS — Z5189 Encounter for other specified aftercare: Secondary | ICD-10-CM | POA: Insufficient documentation

## 2014-05-02 ENCOUNTER — Ambulatory Visit: Payer: Medicare HMO

## 2014-05-02 DIAGNOSIS — Z5189 Encounter for other specified aftercare: Secondary | ICD-10-CM | POA: Diagnosis not present

## 2014-05-04 ENCOUNTER — Ambulatory Visit: Payer: Medicare HMO | Admitting: Rehabilitation

## 2014-05-04 DIAGNOSIS — Z5189 Encounter for other specified aftercare: Secondary | ICD-10-CM | POA: Diagnosis not present

## 2014-05-10 ENCOUNTER — Ambulatory Visit: Payer: Medicare HMO | Admitting: Rehabilitation

## 2014-05-10 DIAGNOSIS — Z5189 Encounter for other specified aftercare: Secondary | ICD-10-CM | POA: Diagnosis not present

## 2014-05-16 ENCOUNTER — Ambulatory Visit: Payer: Medicare HMO

## 2014-05-16 DIAGNOSIS — Z5189 Encounter for other specified aftercare: Secondary | ICD-10-CM | POA: Diagnosis not present

## 2014-05-18 ENCOUNTER — Ambulatory Visit: Payer: Medicare HMO

## 2014-05-18 DIAGNOSIS — Z5189 Encounter for other specified aftercare: Secondary | ICD-10-CM | POA: Diagnosis not present

## 2014-06-01 ENCOUNTER — Encounter: Payer: Self-pay | Admitting: Internal Medicine

## 2014-06-01 ENCOUNTER — Ambulatory Visit (INDEPENDENT_AMBULATORY_CARE_PROVIDER_SITE_OTHER): Payer: Medicare HMO | Admitting: Internal Medicine

## 2014-06-01 ENCOUNTER — Other Ambulatory Visit (INDEPENDENT_AMBULATORY_CARE_PROVIDER_SITE_OTHER): Payer: Medicare HMO

## 2014-06-01 VITALS — BP 165/87 | HR 48 | Temp 97.9°F | Ht 72.5 in | Wt 280.2 lb

## 2014-06-01 DIAGNOSIS — I10 Essential (primary) hypertension: Secondary | ICD-10-CM

## 2014-06-01 DIAGNOSIS — G5603 Carpal tunnel syndrome, bilateral upper limbs: Secondary | ICD-10-CM

## 2014-06-01 DIAGNOSIS — Z86010 Personal history of colonic polyps: Secondary | ICD-10-CM

## 2014-06-01 DIAGNOSIS — G5600 Carpal tunnel syndrome, unspecified upper limb: Secondary | ICD-10-CM

## 2014-06-01 DIAGNOSIS — E785 Hyperlipidemia, unspecified: Secondary | ICD-10-CM

## 2014-06-01 DIAGNOSIS — E039 Hypothyroidism, unspecified: Secondary | ICD-10-CM

## 2014-06-01 DIAGNOSIS — R351 Nocturia: Secondary | ICD-10-CM

## 2014-06-01 LAB — CBC WITH DIFFERENTIAL/PLATELET
BASOS PCT: 0.6 % (ref 0.0–3.0)
Basophils Absolute: 0 10*3/uL (ref 0.0–0.1)
EOS PCT: 2.6 % (ref 0.0–5.0)
Eosinophils Absolute: 0.2 10*3/uL (ref 0.0–0.7)
HEMATOCRIT: 43.2 % (ref 39.0–52.0)
Hemoglobin: 14.6 g/dL (ref 13.0–17.0)
LYMPHS ABS: 1.8 10*3/uL (ref 0.7–4.0)
Lymphocytes Relative: 29.4 % (ref 12.0–46.0)
MCHC: 33.8 g/dL (ref 30.0–36.0)
MCV: 93.2 fl (ref 78.0–100.0)
MONO ABS: 0.5 10*3/uL (ref 0.1–1.0)
MONOS PCT: 8.2 % (ref 3.0–12.0)
Neutro Abs: 3.5 10*3/uL (ref 1.4–7.7)
Neutrophils Relative %: 59.2 % (ref 43.0–77.0)
Platelets: 179 10*3/uL (ref 150.0–400.0)
RBC: 4.64 Mil/uL (ref 4.22–5.81)
RDW: 13.3 % (ref 11.5–15.5)
WBC: 6 10*3/uL (ref 4.0–10.5)

## 2014-06-01 LAB — LIPID PANEL
CHOLESTEROL: 126 mg/dL (ref 0–200)
HDL: 44 mg/dL (ref >39.00)
LDL CALC: 66 mg/dL (ref 0–99)
NonHDL: 82
Total CHOL/HDL Ratio: 3
Triglycerides: 80 mg/dL (ref 0.0–149.0)
VLDL: 16 mg/dL (ref 0.0–40.0)

## 2014-06-01 LAB — BASIC METABOLIC PANEL
BUN: 16 mg/dL (ref 6–23)
CHLORIDE: 101 meq/L (ref 96–112)
CO2: 28 mEq/L (ref 19–32)
Calcium: 9.3 mg/dL (ref 8.4–10.5)
Creatinine, Ser: 0.8 mg/dL (ref 0.4–1.5)
GFR: 96.39 mL/min (ref >60.00)
Glucose, Bld: 99 mg/dL (ref 70–99)
POTASSIUM: 3.9 meq/L (ref 3.5–5.1)
SODIUM: 138 meq/L (ref 135–145)

## 2014-06-01 LAB — HEPATIC FUNCTION PANEL
ALT: 16 U/L (ref 0–53)
AST: 24 U/L (ref 0–37)
Albumin: 4.3 g/dL (ref 3.5–5.2)
Alkaline Phosphatase: 71 U/L (ref 39–117)
BILIRUBIN TOTAL: 1.3 mg/dL — AB (ref 0.2–1.2)
Bilirubin, Direct: 0.2 mg/dL (ref 0.0–0.3)
Total Protein: 7.1 g/dL (ref 6.0–8.3)

## 2014-06-01 LAB — TSH: TSH: 2.67 u[IU]/mL (ref 0.35–4.50)

## 2014-06-01 NOTE — Progress Notes (Signed)
Pre visit review using our clinic review tool, if applicable. No additional management support is needed unless otherwise documented below in the visit note. 

## 2014-06-01 NOTE — Assessment & Plan Note (Signed)
Blood pressure goals reviewed. BMET 

## 2014-06-01 NOTE — Patient Instructions (Signed)

## 2014-06-01 NOTE — Progress Notes (Signed)
Subjective:    Patient ID: Steven Munoz, male    DOB: 1941-06-20, 73 y.o.   MRN: [id number]  HPI  He is here to assess active health issues & conditions. PMH, FH, & Social history verified & updated   A heart healthy/low salt  diet is followed; exercise encompasses 90 minutes 2 times per week as ellipitical,bike & weights without symptoms.  Family history is + for premature coronary disease. With CAD  LDL goal is less than 70 . There is medication compliance with the statin.  Full dose ASA taken. BP @ home 128-148/73-78.   Review of Systems  Specifically denied are  chest pain, palpitations, dyspnea, or claudication.  Significant abdominal symptoms, memory deficit, or myalgias not present. Numbness of hands upon awakening occasionally.      Objective:   Physical Exam   Gen.: well-nourished in appearance. Alert, appropriate and cooperative throughout exam. As per CDC Guidelines ,Epic documents obesity as being present . Appears younger than stated age  Head: Normocephalic without obvious abnormalities Eyes: No corneal or conjunctival inflammation noted. Ptosis OD.Pupils equal round reactive to light and accommodation. Extraocular motion intact. Ears: External  ear exam reveals no significant lesions or deformities. Canals clear .TMs normal. Hearing is grossly decreased bilaterally. Nose: External nasal exam reveals no deformity or inflammation. Nasal mucosa are pink and moist. No lesions or exudates noted.   Mouth: Oral mucosa and oropharynx reveal no lesions or exudates. Teeth in good repair. Neck: No deformities, masses, or tenderness noted. Range of motion & Thyroid normal. Lungs: Normal respiratory effort; chest expands symmetrically. Lungs are clear to auscultation without rales, wheezes, or increased work of breathing. Heart: Slow rate , slightly irregular rhythm. Normal S1 and S2. No gallop, click, or rub.Grade 1/2 over 6 systolic murmur. Abdomen: Protuberant.Bowel sounds  normal; abdomen soft and nontender. No masses, organomegaly or hernias noted. Genitalia: as per  Dr Alinda Money                                 Musculoskeletal/extremities: No deformity or scoliosis noted of  the thoracic or lumbar spine.No clubbing ,cyanosis, or significant extremity  deformity noted. Trace edema. Range of motion normal .Tone & strength normal. Hand joints normal  Fingernail health good. Fusiform knees with crepitus Able to lie down & sit up w/o help. Negative SLR bilaterally Vascular: Carotid, radial artery, dorsalis pedis and  posterior tibial pulses are full and equal. No bruits present. Neurologic: Alert and oriented x3. Deep tendon reflexes symmetrical and normal. Neg Tinel's bilaterally Gait normal  .      Skin: Intact without suspicious lesions or rashes. Lymph: No cervical, axillary  lymphadenopathy present. Psych: Mood and affect are normal. Normally interactive                                       Assessment & Plan:                                                  See Current Assessment & Plan in Problem List under specific DiagnosisThe labs will be reviewed and risks and options assessed. Written recommendations will be provided by mail or directly through My Chart.Further evaluation  or change in medical therapy will be directed by those results.

## 2014-06-01 NOTE — Assessment & Plan Note (Signed)
CBC

## 2014-06-01 NOTE — Assessment & Plan Note (Signed)
Urology referral

## 2014-06-01 NOTE — Assessment & Plan Note (Addendum)
Lipids, LFTs, TSH  

## 2014-06-01 NOTE — Assessment & Plan Note (Signed)
TSH 

## 2014-06-02 DIAGNOSIS — E669 Obesity, unspecified: Secondary | ICD-10-CM | POA: Insufficient documentation

## 2014-06-02 NOTE — Assessment & Plan Note (Signed)
TSH 

## 2014-06-28 ENCOUNTER — Encounter: Payer: Self-pay | Admitting: Internal Medicine

## 2014-06-28 ENCOUNTER — Ambulatory Visit (INDEPENDENT_AMBULATORY_CARE_PROVIDER_SITE_OTHER): Payer: Medicare HMO | Admitting: Internal Medicine

## 2014-06-28 VITALS — BP 134/80 | HR 82 | Temp 98.2°F | Wt 287.2 lb

## 2014-06-28 DIAGNOSIS — I889 Nonspecific lymphadenitis, unspecified: Secondary | ICD-10-CM

## 2014-06-28 MED ORDER — CEPHALEXIN 500 MG PO CAPS: 500.0000 mg | ORAL_CAPSULE | Freq: Two times a day (BID) | ORAL | Status: DC

## 2014-06-28 NOTE — Progress Notes (Signed)
Pre visit review using our clinic review tool, if applicable. No additional management support is needed unless otherwise documented below in the visit note. 

## 2014-06-28 NOTE — Patient Instructions (Signed)
Use warm moist compresses to 3 times a day to the affected area.

## 2014-06-28 NOTE — Progress Notes (Signed)
   Subjective:    Patient ID: Steven Munoz, male    DOB: [birth year], 73 y.o.   MRN: [id number]  HPI  Symptoms began 06/23/14 as soreness in the right neck. This has progressed and has been associated with some swelling in the neck suggestive of an enlarged lymph node.  The pain progressed and peaked as of 8/31.  He had taken a total of 4 Advil a day with improvement in the pain  There is no associated upper respiratory tract, otic, or dental symptomatology.      Review of Systems Frontal headache, facial pain , nasal purulence, dental pain, sore throat , otic pain or otic discharge denied. No fever , chills or sweats.   He specifically denies tinnitus or otic discharge.  He has not had no associated visual changes.     Objective:   Physical Exam  Pertinent or positive findings include: There is a 2 x 2 cm soft mass below the right posterior mandible which is tender to palpation. This transilluminates. There is no change in color or temperature of the skin overlying this lesion. There is no palpable pulsations and no bruit is auscultated.  There is no evidence of TMJ on the right although he has pain with maximal opening of the mouth at the site of the lesion.  He has ptosis bilaterally, greater on the right. He also has resting esotropia of the left eye.  General appearance:well nourished; no acute distress or increased work of breathing is present.  No  lymphadenopathy in axilla noted.   Eyes: No conjunctival inflammation or lid edema is present. There is no scleral icterus.  Ears:  External ear exam shows no significant lesions or deformities.  Otoscopic examination reveals clear canals, tympanic membranes are intact bilaterally without bulging, retraction, inflammation or discharge.  Nose:  External nasal examination shows no deformity or inflammation. Nasal mucosa are pink and moist without lesions or exudates. No septal dislocation or deviation.No obstruction to airflow.    Oral exam: Upper partial.Dental hygiene is good; lips and gums are healthy appearing.There is no oropharyngeal erythema or exudate noted.   Neck:  No deformities, thyromegaly, masses, or tenderness noted.   Supple with full range of motion without pain.   Heart:  Normal rate and regular rhythm. S1 and S2 normal without gallop, murmur, click, rub or other extra sounds.   Lungs:Chest clear to auscultation; no wheezes, rhonchi,rales ,or rubs present.No increased work of breathing.    Extremities:  No cyanosis, edema, or clubbing  noted    Skin: Warm & dry w/o jaundice or tenting.         Assessment & Plan:  #1 right cervical lymphadenitis; clinically there is no suggestion of TMJ or vascular etiology of this swelling  Plan: See orders and recommendations

## 2014-07-08 ENCOUNTER — Telehealth: Payer: Self-pay | Admitting: Internal Medicine

## 2014-07-08 DIAGNOSIS — I10 Essential (primary) hypertension: Secondary | ICD-10-CM

## 2014-07-08 MED ORDER — LOSARTAN POTASSIUM 100 MG PO TABS
100.0000 mg | ORAL_TABLET | Freq: Every day | ORAL | Status: DC
Start: 2014-07-08 — End: 2015-07-06

## 2014-07-08 MED ORDER — ROSUVASTATIN CALCIUM 20 MG PO TABS: 20.0000 mg | ORAL_TABLET | Freq: Every day | ORAL | Status: DC

## 2014-07-08 MED ORDER — LEVOTHYROXINE SODIUM 25 MCG PO TABS: ORAL_TABLET | ORAL | Status: DC

## 2014-07-08 NOTE — Telephone Encounter (Signed)
Called pt no answer LMOM rx's has been sent to [place].Marland KitchenJohny Munoz

## 2014-07-08 NOTE — Telephone Encounter (Signed)
Patient would like refills on levothyroxine, crestor and losartan (3 month supply) sent to [facility] home delivery

## 2014-08-16 ENCOUNTER — Ambulatory Visit (INDEPENDENT_AMBULATORY_CARE_PROVIDER_SITE_OTHER): Payer: Medicare HMO

## 2014-08-16 DIAGNOSIS — Z23 Encounter for immunization: Secondary | ICD-10-CM

## 2014-10-03 NOTE — Progress Notes (Signed)
Patient ID: Steven Munoz, male   DOB: 06-16-1941, 73 y.o.   MRN: [id number] Steven Munoz is S/P CABG with periop afib in 05/2009 . Marland Kitchen He has good LV function. . There has been no SSCP, palpitations, PND, orthopnea, and only trace edema . He is compliant with his meds for BP and cholesterol. His BP is borderline and he had LE edema. HCTZ was added to his meds in March 2012 . He has had improvement in his BP and edema. He has to stick with a low carb diet as his weight is up again Right hip arthritis sees [name] Has had two cortisone injections. BP a bit high and HR a bit low with first degree AV block  8/14 LDL 84 with normal LFTls  Wearing his CPAP followed by [name]  Had right hip surgery at [facility] with [name] [phone] op hip dislocated and has not been quite right  Has pain RLE with known varicosities Only had ASA post op no anticoagulation  Duplex LE 6/15 no DVT  No chest pain  Some urinary frequency not helped by cialis   ROS: Denies fever, malais, weight loss, blurry vision, decreased visual acuity, cough, sputum, SOB, hemoptysis, pleuritic pain, palpitaitons, heartburn, abdominal pain, melena, lower extremity edema, claudication, or rash.  All other systems reviewed and negative  General: Affect appropriate Overweight white male  HEENT: normal Neck supple with no adenopathy JVP normal no bruits no thyromegaly Lungs clear with no wheezing and good diaphragmatic motion Heart:  S1/S2 no murmur, no rub, gallop or click PMI normal Abdomen: benighn, BS positve, no tenderness, no AAA no bruit.  No HSM or HJR Distal pulses intact with no bruits No edema Neuro non-focal Skin warm and dry No muscular weakness   Current Outpatient Prescriptions  Medication Sig Dispense Refill  . aspirin 325 MG tablet Take 325 mg by mouth daily.      . Coenzyme Q10 (COQ10) 100 MG CAPS Take 100 mg by mouth daily.     Marland Kitchen FOLIC ACID PO Take 944 mg by mouth daily.      .  hydrochlorothiazide (MICROZIDE) 12.5 MG capsule Take 1 capsule (12.5 mg total) by mouth daily. 90 capsule 1  . levothyroxine (SYNTHROID, LEVOTHROID) 25 MCG tablet 1 po daily except 1 and 1/2 tab tues,thurs,sat (Patient taking differently: 1 po daily except 1 and 1/2 tab tues,thurs,sat Take 1 tablet by mouth on Sunday, Monday, Wednesday, Friday and take 1/2 tablet by mouth on Tuesday, Thursday, and Saturday) 122 tablet 3  . losartan (COZAAR) 100 MG tablet Take 1 tablet (100 mg total) by mouth daily. 90 tablet 3  . metoprolol tartrate (LOPRESSOR) 25 MG tablet Take 0.5 tablets (12.5 mg total) by mouth daily. 45 tablet 1  . Multiple Vitamins-Minerals (CENTRUM SILVER PO) Take 1 tablet by mouth daily.    . Multiple Vitamins-Minerals (PRESERVISION AREDS PO) Take 2 capsules by mouth daily.    Marland Kitchen omeprazole (PRILOSEC) 20 MG capsule Take 1 capsule (20 mg total) by mouth daily. 90 capsule 3  . rosuvastatin (CRESTOR) 20 MG tablet Take 1 tablet (20 mg total) by mouth daily. 90 tablet 3   No current facility-administered medications for this visit.    Allergies  Povidone-iodine; Ofloxacin; and Povidone iodine  Electrocardiogram:  3/15  SB rate 49 otherwise normal   Assessment and Plan

## 2014-10-04 ENCOUNTER — Encounter: Payer: Self-pay | Admitting: Cardiovascular Disease

## 2014-10-04 ENCOUNTER — Ambulatory Visit (INDEPENDENT_AMBULATORY_CARE_PROVIDER_SITE_OTHER): Payer: Medicare HMO | Admitting: Cardiovascular Disease

## 2014-10-04 VITALS — BP 132/84 | HR 52 | Ht 72.0 in | Wt 291.1 lb

## 2014-10-04 DIAGNOSIS — E785 Hyperlipidemia, unspecified: Secondary | ICD-10-CM

## 2014-10-04 DIAGNOSIS — I480 Paroxysmal atrial fibrillation: Secondary | ICD-10-CM

## 2014-10-04 DIAGNOSIS — I440 Atrioventricular block, first degree: Secondary | ICD-10-CM

## 2014-10-04 DIAGNOSIS — I10 Essential (primary) hypertension: Secondary | ICD-10-CM

## 2014-10-04 DIAGNOSIS — Z951 Presence of aortocoronary bypass graft: Secondary | ICD-10-CM

## 2014-10-04 NOTE — Assessment & Plan Note (Signed)
Cholesterol is at goal.  Continue current dose of statin and diet Rx.  No myalgias or side effects.  F/U  LFT's in 6 months. Lab Results  Component Value Date   LDLCALC 66 06/01/2014

## 2014-10-04 NOTE — Assessment & Plan Note (Signed)
Stable with no angina and good activity level.  Continue medical Rx  

## 2014-10-04 NOTE — Patient Instructions (Signed)
Your physician recommends that you continue on your current medications as directed. Please refer to the Current Medication list given to you today.  Your physician wants you to follow-up in: 6 months with Dr. Nishan. You will receive a reminder letter in the mail two months in advance. If you don't receive a letter, please call our office to schedule the follow-up appointment.  

## 2014-10-04 NOTE — Assessment & Plan Note (Signed)
Well controlled.  Continue current medications and low sodium Dash type diet.    

## 2014-10-04 NOTE — Assessment & Plan Note (Signed)
No palpitations maintaining NSR  Improved

## 2014-10-04 NOTE — Assessment & Plan Note (Signed)
Asymptomatic avoid beta blockers with bradycardia  Yearly ECG

## 2014-10-06 ENCOUNTER — Encounter: Payer: Self-pay | Admitting: Internal Medicine

## 2014-10-06 ENCOUNTER — Ambulatory Visit (INDEPENDENT_AMBULATORY_CARE_PROVIDER_SITE_OTHER): Payer: Medicare HMO | Admitting: Internal Medicine

## 2014-10-06 VITALS — BP 142/70 | HR 66 | Temp 97.9°F | Wt 293.0 lb

## 2014-10-06 DIAGNOSIS — J309 Allergic rhinitis, unspecified: Secondary | ICD-10-CM

## 2014-10-06 MED ORDER — AMOXICILLIN 500 MG PO CAPS: 500.0000 mg | ORAL_CAPSULE | Freq: Three times a day (TID) | ORAL | Status: DC

## 2014-10-06 NOTE — Progress Notes (Signed)
Pre visit review using our clinic review tool, if applicable. No additional management support is needed unless otherwise documented below in the visit note. 

## 2014-10-06 NOTE — Progress Notes (Signed)
   Subjective:    Patient ID: Steven Munoz, male    DOB: 08-27-1941, 73 y.o.   MRN: [id number]  HPI  Over the past 3 days he's had intermittent sore throat in the context of rhinitis and head congestion.  He also has a cough with clear sputum.  He has sneezing mainly in the morning up to 10-12 times. He has some itchy eyes.  He has no other signs to suggest rhinosinusitis.  Flu shot is up-to-date. He quit smoking in 1969.    Review of Systems Frontal headache, facial pain , nasal purulence, dental pain, sore throat , otic pain or otic discharge denied. No fever , chills or sweats.     Objective:   Physical Exam General appearance:good health ;well nourished; no acute distress or increased work of breathing is present.  No  lymphadenopathy about the head, neck, or axilla noted.   Eyes: No conjunctival inflammation or lid edema is present. There is no scleral icterus.  Ears:  External ear exam shows no significant lesions or deformities.  Otoscopic examination reveals clear canals, tympanic membranes are intact bilaterally without bulging, retraction, inflammation or discharge.  Nose:  External nasal examination shows no deformity or inflammation. Nasal mucosa are pink and moist without lesions or exudates. No septal dislocation or deviation.No obstruction to airflow.   Oral exam: Dental hygiene is good; lips and gums are healthy appearing.There is no oropharyngeal erythema or exudate noted.   Neck:  No deformities, thyromegaly, masses, or tenderness noted.   Supple with full range of motion without pain.   Heart:  Normal rate and regular rhythm. S1 and S2 normal without gallop, click, rub or other extra sounds. Grade 1/6 systolic murmur  Lungs:Chest clear to auscultation; no wheezes, rhonchi,rales ,or rubs present.No increased work of breathing.    Extremities:  No cyanosis, edema, or clubbing  noted    Skin: Warm & dry w/o jaundice or tenting.         Assessment &  Plan:  #1 allergic rhinitis See orders & AVS

## 2014-10-06 NOTE — Patient Instructions (Signed)

## 2014-10-18 ENCOUNTER — Telehealth: Payer: Self-pay | Admitting: Internal Medicine

## 2014-10-18 DIAGNOSIS — I10 Essential (primary) hypertension: Secondary | ICD-10-CM

## 2014-10-18 MED ORDER — HYDROCHLOROTHIAZIDE 12.5 MG PO CAPS: 12.5000 mg | ORAL_CAPSULE | Freq: Every day | ORAL | Status: DC

## 2014-10-18 NOTE — Telephone Encounter (Signed)
Pt is almost out of BP medication----hydrochlorothiazide  requesting refill. [facility] home delivery

## 2014-11-28 DIAGNOSIS — M76891 Other specified enthesopathies of right lower limb, excluding foot: Secondary | ICD-10-CM | POA: Insufficient documentation

## 2014-12-07 ENCOUNTER — Ambulatory Visit: Payer: Medicare HMO | Admitting: Pulmonary Disease

## 2014-12-12 ENCOUNTER — Ambulatory Visit: Payer: Medicare HMO | Admitting: Physical Therapy

## 2014-12-21 ENCOUNTER — Ambulatory Visit: Payer: Medicare HMO | Attending: Orthopaedic Surgery | Admitting: Physical Therapy

## 2014-12-21 DIAGNOSIS — E039 Hypothyroidism, unspecified: Secondary | ICD-10-CM | POA: Diagnosis not present

## 2014-12-21 DIAGNOSIS — I4891 Unspecified atrial fibrillation: Secondary | ICD-10-CM | POA: Insufficient documentation

## 2014-12-21 DIAGNOSIS — E669 Obesity, unspecified: Secondary | ICD-10-CM | POA: Diagnosis not present

## 2014-12-21 DIAGNOSIS — M25669 Stiffness of unspecified knee, not elsewhere classified: Secondary | ICD-10-CM

## 2014-12-21 DIAGNOSIS — E785 Hyperlipidemia, unspecified: Secondary | ICD-10-CM | POA: Insufficient documentation

## 2014-12-21 DIAGNOSIS — M25551 Pain in right hip: Secondary | ICD-10-CM | POA: Insufficient documentation

## 2014-12-21 DIAGNOSIS — G4730 Sleep apnea, unspecified: Secondary | ICD-10-CM | POA: Diagnosis not present

## 2014-12-21 DIAGNOSIS — R29898 Other symptoms and signs involving the musculoskeletal system: Secondary | ICD-10-CM | POA: Diagnosis not present

## 2014-12-21 DIAGNOSIS — Z96641 Presence of right artificial hip joint: Secondary | ICD-10-CM | POA: Insufficient documentation

## 2014-12-21 DIAGNOSIS — K219 Gastro-esophageal reflux disease without esophagitis: Secondary | ICD-10-CM | POA: Insufficient documentation

## 2014-12-21 DIAGNOSIS — I10 Essential (primary) hypertension: Secondary | ICD-10-CM | POA: Diagnosis not present

## 2014-12-21 DIAGNOSIS — Z8639 Personal history of other endocrine, nutritional and metabolic disease: Secondary | ICD-10-CM | POA: Insufficient documentation

## 2014-12-21 NOTE — Therapy (Signed)
[place], [place], [address] Phone: [phone]   Fax:  [phone]  Physical Therapy Evaluation  Patient Details  Name: Steven Munoz MRN: [id number] Date of Birth: 1941-10-23 Referring Provider:  Myra Rude, MD  Encounter Date: 12/21/2014      PT End of Session - 12/21/14 5361    Visit Number 1   Number of Visits 16   Date for PT Re-Evaluation 02/19/15   PT Start Time 0845   PT Stop Time 0924   PT Time Calculation (min) 39 min   Activity Tolerance Patient tolerated treatment well  Active Hip flexion limited by pain   Behavior During Therapy [facility] for tasks assessed/performed      Past Medical History  Diagnosis Date  . Murmur   . Hypothyroidism   . Hyperlipidemia   . Hypertension   . Diverticulosis   . GERD (gastroesophageal reflux disease)     S/P dilation  2005  . Sleep apnea     on CPAP; Dr [name]  . Dermatophytosis of nail   . Dysmetabolic syndrome   . Gout   . Colonic polyp 2002, 2005, 2009    [name]  . 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 Nephrolithiasis     X 1  . Pneumonia age 37    treated as inpatient    Past Surgical History  Procedure Laterality Date  . Coronary artery bypass graft  2010    Median sternotomy, extracorporeal circulation, coronary bypass graft surgery x3 using a left internal mammary artery graft to left anterior descending coronary artery, with a sequential  saphenous vein graft to posterior descending and posterolateral branches  of the right coronary artery.  Endoscopic vein harvesting from the right  . Athroscopy      right knee X2, left knee X1  . Colonoscopy  2002 & 2009 &2014    polypectomy; [name]  . Replacement total knee      right 2003  . Replacement total knee      partial Left March 2009  . Nose surgery  11/20/12    L max  antrectomy;septoplasty;turbinate reduction. [name]  . Nail alvusion Bilateral     Hallux  . 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  Visit Diagnosis:  Right hip pain - Plan: PT plan of care cert/re-cert  Weakness of right hip - Plan: PT plan of care cert/re-cert  Decreased ROM of lower extremity - Plan: PT plan of care cert/re-cert      Subjective Assessment - 12/21/14 0845    Symptoms pt is a 74 YO presents with Right hip/groin pain that started since his his THR in April 2015 and has gradually gotten worse since onset, he reports that he feels weak when raising his right hipsecondary to the pain   Pertinent History THR 02/18/14   How long can you sit comfortably? No problems   How long can you stand comfortably? No problems   How long can you walk comfortably? No problems   Diagnostic tests X-ray 3 weeks ago, impression was negative   Patient Stated Goals To be pain free, to be able to get into and out of the car without any pain.   Currently in Pain? Yes   Pain Location Hip  6-7/10  with hip flexion    Pain Orientation Right   Pain Descriptors / Indicators Stabbing   Pain Type Chronic pain   Pain Onset More than a month ago   Pain Frequency Intermittent   Aggravating Factors  Hip flexion and catching foot on the floor or obstacles   Pain Relieving Factors Exercising/moving around   Effect of Pain on Daily Activities getting in and out of the car           [facility] PT Assessment - 12/21/14 0001    Assessment   Medical Diagnosis Right hip pain   Onset Date --  01/2014   Next MD Visit April/2016   Prior Therapy R THR PT   Precautions   Precautions None   Restrictions   Weight Bearing Restrictions No   Balance Screen   Has the patient fallen in the past 6 months No   Has the patient had a decrease in activity level because of a fear of falling?  No   Is the patient reluctant to leave  their home because of a fear of falling?  No   Home Environment   Living Enviornment Private residence   Living Arrangements Spouse/significant other   Available Help at Discharge Available 24 hours/day   Type of [place] to enter   Entrance Stairs-Number of Steps 15   Entrance Stairs-Rails Left   Home Layout Two level   Prior Function   Level of Independence Independent with basic ADLs;Independent with homemaking with ambulation;Independent with gait;Independent with transfers   Vocation Retired   Leisure Driving cars around a track   ROM / Strength   AROM / PROM / Strength AROM;Strength;PROM   AROM   AROM Assessment Site Hip   Right/Left Hip Right   Right Hip Flexion 25   Right Hip External Rotation  20   Right Hip Internal Rotation  40   Right Hip ABduction 68   PROM   Overall PROM  Within functional limits for tasks performed   PROM Assessment Site Hip   Right/Left Hip Right   Strength   Strength Assessment Site Hip   Right/Left Hip Right;Left   Right Hip Flexion 3-/5   Right Hip Extension 5/5   Right Hip External Rotation  --  5/5   Right Hip Internal Rotation  --  5/5   Right Hip ABduction 4+/5   Right Hip ADduction 4+/5  mild pain   Left Hip Flexion 5/5   Left Hip Extension 5/5   Left Hip External Rotation  --  5/5   Left Hip Internal Rotation  --  5/5   Left Hip ABduction 5/5   Left Hip ADduction 5/5   Special Tests    Special Tests Hip Special Tests   Hip Special Tests  Hip Scouring   Hip Scouring   Findings Positive   Side Right   Comments Mild pain   Balance   Balance Assessed Yes   Static Standing Balance   Static Standing Balance -  Activities  Single Leg Stance - Right Leg;Single Leg Stance - Left Leg   Static Standing - Comment/# of Minutes LLE 15 seconds with mild postural sway, RLE 6 seconds with moderate postural sway                  [facility] Adult PT Treatment/Exercise - 12/21/14 0001    Exercises    Exercises Knee/Hip   Knee/Hip Exercises: Stretches   Hip Flexor Stretch 30  seconds;3 reps  performed in prone                PT Education - 12/30/14 0928    Education provided Yes   Education Details HEP, POC, prognosis and treatment goals   Person(s) Educated Patient   Methods Explanation   Comprehension Verbalized understanding          PT Short Term Goals - 12/30/2014 0935    PT SHORT TERM GOAL #1   Title to be independet with basic HEP   Time 4   Period Weeks   Status New   PT SHORT TERM GOAL #2   Title to decrease pain to < 3/10 during hip flexion activities to promote funtional progression   Time 4   Period Weeks   Status New   PT SHORT TERM GOAL #3   Title to increase R hip Strength to > 3+/5 to assist with funcitonal progression   Time 4   Period Weeks           PT Long Term Goals - 2014/12/30 3016    PT LONG TERM GOAL #1   Title To be independent with advanced HEP   Time 8   Period Weeks   Status New   PT LONG TERM GOAL #2   Title to decreae pain to < 2/10 to help with ADLs    Time 8   Period Weeks   Status New   PT LONG TERM GOAL #3   Title To increase R hip strength to >4/5 without pain to help with donning and doffing clothing   Time 8   Period Weeks   Status New   PT LONG TERM GOAL #4   Title To verbalize and demonstrate techniques to prevent reinjury to R hip including stretching, strengthening and lifting mechanics   Time 8   Period Weeks   Status New               Plan - 2014-12-30 0109    Clinical Impression Statement pt presents with R hip pain that occurs during hip flexion acitivities. he demonstrates R hip weakness of 3-/5 secondary to pain.  AROM/PROM BIL is WFL except for R hip flexion due to pain/weakness and increased postural sway during SLS activities BIL with R>L. pt would benefit from skilled physical therapy to address the impairment listed   Pt will benefit from skilled therapeutic intervention in order to  improve on the following deficits Decreased range of motion;Impaired flexibility;Improper body mechanics;Decreased strength;Pain;Decreased balance;Decreased endurance   Rehab Potential Good   PT Frequency 2x / week   PT Duration 8 weeks   PT Treatment/Interventions ADLs/Self Care Home Management;Moist Heat;Therapeutic activities;Passive range of motion;Therapeutic exercise;Ultrasound;Balance training;Manual techniques;Neuromuscular re-education;Cryotherapy;Electrical Stimulation   PT Next Visit Plan work on R hip flex stretching and mobilization of R hip (THR on R)    PT Home Exercise Plan Hip flexor stretching, perform LEFS   Consulted and Agree with Plan of Care Patient          G-Codes - 2014/12/30 0945    Functional Assessment Tool Used Clincal judgment   Functional Limitation Mobility: Walking and moving around   Mobility: Walking and Moving Around Current Status [phone]) At least 40 percent but less than 60 percent impaired, limited or restricted   Mobility: Walking and Moving Around Goal Status (D3220) At least 1 percent but less than 20 percent impaired, limited or restricted   Mobility: Walking and Moving Around Discharge Status [phone]) At least  1 percent but less than 20 percent impaired, limited or restricted       Problem List Patient Active Problem List   Diagnosis Date Noted  . Obesity (BMI 30-39.9) 06/02/2014  . CTS (carpal tunnel syndrome) 06/01/2014  . Varicose veins 04/07/2014  . Prolonged P-R interval 03/04/2013  . NEPHROLITHIASIS, HX OF 05/18/2010  . ATRIAL FIBRILLATION 06/20/2009  . CORONARY ARTERY BYPASS GRAFT, HX OF 06/20/2009  . PREMATURE VENTRICULAR CONTRACTIONS 05/09/2009  . DYSPNEA 05/09/2009  . MURMUR 05/08/2009  . Essential hypertension 04/10/2009  . DIVERTICULOSIS, COLON 04/10/2009  . G E R D 08/05/2008  . OBSTRUCTIVE SLEEP APNEA 08/05/2008  . DERMATOPHYTOSIS OF NAIL 07/29/2008  . Nocturia 05/30/2008  . HYPOTHYROIDISM 02/15/2008  . Elevated lipids  02/15/2008  . GOUT 02/15/2008  . DYSMETABOLIC SYNDROME X 71/69/6789  . COLONIC POLYPS, HX OF 01/14/2008   [name] PT, DPT, LAT, ATC  12/21/2014  11:52 AM   [facility] [address] [place], [place], [address] Phone: [phone]   Fax:  [phone]

## 2014-12-21 NOTE — Therapy (Signed)
[place], [place], [address] Phone: [phone]   Fax:  [phone]  Physical Therapy Evaluation  Patient Details  Name: Steven Munoz MRN: [id number] Date of Birth: 10/31/1940 Referring Provider:  Myra Rude, MD  Encounter Date: 12/21/2014      PT End of Session - 12/21/14 0300    Visit Number 1   Number of Visits 16   Date for PT Re-Evaluation 02/19/15   PT Start Time 0845   PT Stop Time 0924   PT Time Calculation (min) 39 min   Activity Tolerance Patient tolerated treatment well  Active Hip flexion limited by pain   Behavior During Therapy [facility] for tasks assessed/performed      Past Medical History  Diagnosis Date  . Murmur   . Hypothyroidism   . Hyperlipidemia   . Hypertension   . Diverticulosis   . GERD (gastroesophageal reflux disease)     S/P dilation  2005  . Sleep apnea     on CPAP; Dr [name]  . Dermatophytosis of nail   . Dysmetabolic syndrome   . Gout   . Colonic polyp 2002, 2005, 2009    [name]  . 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 Nephrolithiasis     X 1  . Pneumonia age 74    treated as inpatient    Past Surgical History  Procedure Laterality Date  . Coronary artery bypass graft  2010    Median sternotomy, extracorporeal circulation, coronary bypass graft surgery x3 using a left internal mammary artery graft to left anterior descending coronary artery, with a sequential  saphenous vein graft to posterior descending and posterolateral branches  of the right coronary artery.  Endoscopic vein harvesting from the right  . Athroscopy      right knee X2, left knee X1  . Colonoscopy  2002 & 2009 &2014    polypectomy; [name]  . Replacement total knee      right 2003  . Replacement total knee      partial Left March 2009  . Nose surgery  11/20/12    L max  antrectomy;septoplasty;turbinate reduction. [name]  . Nail alvusion Bilateral     Hallux  . 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  Visit Diagnosis:  Right hip pain  Weakness of right hip  Decreased ROM of lower extremity      Subjective Assessment - 12/21/14 0845    Symptoms pt is a 74 YO presents with Right hip/groin pain that started since his his THR in April 2015 and has gradually gotten worse since onset, he reports that he feels weak when raising his right hipsecondary to the pain   Pertinent History THR 02/18/14   How long can you sit comfortably? No problems   How long can you stand comfortably? No problems   How long can you walk comfortably? No problems   Diagnostic tests X-ray 3 weeks ago, impression was negative   Patient Stated Goals To be pain free, to be able to get into and out of the car without any pain.   Currently in Pain? Yes   Pain Location Hip  6-7/10 with hip flexion    Pain Orientation Right   Pain Descriptors / Indicators Stabbing   Pain Type Chronic  pain   Pain Onset More than a month ago   Pain Frequency Intermittent   Aggravating Factors  Hip flexion and catching foot on the floor or obstacles   Pain Relieving Factors Exercising/moving around   Effect of Pain on Daily Activities getting in and out of the car           [facility] PT Assessment - 12/21/14 0001    Assessment   Medical Diagnosis Right hip pain   Onset Date --  01/2014   Next MD Visit April/2016   Prior Therapy R THR PT   Precautions   Precautions None   Restrictions   Weight Bearing Restrictions No   Balance Screen   Has the patient fallen in the past 6 months No   Has the patient had a decrease in activity level because of a fear of falling?  Yes  decreased working out   Is the patient reluctant to leave their home because of a fear of falling?  No   Home Environment   Living  Enviornment Private residence   Living Arrangements Spouse/significant other   Available Help at Discharge Available 24 hours/day   Type of [place] to enter   Entrance Stairs-Number of Steps 15   Entrance Stairs-Rails Left   Home Layout Two level   Prior Function   Level of Independence Independent with basic ADLs;Independent with homemaking with ambulation;Independent with gait;Independent with transfers   Vocation Retired   Leisure Driving cars around a track   ROM / Strength   AROM / PROM / Strength AROM;Strength;PROM   AROM   AROM Assessment Site Hip   Right/Left Hip Right   Right Hip Flexion 25   Right Hip External Rotation  20   Right Hip Internal Rotation  40   Right Hip ABduction 68   PROM   Overall PROM  Within functional limits for tasks performed   PROM Assessment Site Hip   Right/Left Hip Right   Strength   Strength Assessment Site Hip   Right/Left Hip Right;Left   Right Hip Flexion 3-/5   Right Hip Extension 5/5   Right Hip External Rotation  --  5/5   Right Hip Internal Rotation  --  5/5   Right Hip ABduction 4+/5   Right Hip ADduction 4+/5  mild pain   Left Hip Flexion 5/5   Left Hip Extension 5/5   Left Hip External Rotation  --  5/5   Left Hip Internal Rotation  --  5/5   Left Hip ABduction 5/5   Left Hip ADduction 5/5   Special Tests    Special Tests Hip Special Tests   Hip Special Tests  Hip Scouring   Hip Scouring   Findings Positive   Side Right   Comments Mild pain   Balance   Balance Assessed Yes   Static Standing Balance   Static Standing Balance -  Activities  Single Leg Stance - Right Leg;Single Leg Stance - Left Leg   Static Standing - Comment/# of Minutes LLE 15 seconds with mild postural sway, RLE 6 seconds with moderate postural sway                  [facility] Adult PT Treatment/Exercise - 12/21/14 0001    Exercises   Exercises Knee/Hip   Knee/Hip Exercises: Stretches   Hip Flexor Stretch 30  seconds;3 reps  performed in prone  PT Education - 12-26-14 [phone]    Education provided Yes   Education Details HEP, POC, prognosis and treatment goals   Person(s) Educated Patient   Methods Explanation   Comprehension Verbalized understanding          PT Short Term Goals - 2014/12/26 0935    PT SHORT TERM GOAL #1   Title to be independet with basic HEP   Time 4   Period Weeks   Status New   PT SHORT TERM GOAL #2   Title to decrease pain to < 3/10 during hip flexion activities to promote funtional progression   Time 4   Period Weeks   Status New   PT SHORT TERM GOAL #3   Title to increase R hip Strength to > 3+/5 to assist with funcitonal progression   Time 4   Period Weeks           PT Long Term Goals - 12/26/14 6269    PT LONG TERM GOAL #1   Title To be independent with advanced HEP   Time 8   Period Weeks   Status New   PT LONG TERM GOAL #2   Title to decreae pain to < 2/10 to help with ADLs    Time 8   Period Weeks   Status New   PT LONG TERM GOAL #3   Title To increase R hip strength to >4/5 without pain to help with donning and doffing clothing   Time 8   Period Weeks   Status New   PT LONG TERM GOAL #4   Title To verbalize and demonstrate techniques to prevent reinjury to R hip including stretching, strengthening and lifting mechanics   Time 8   Period Weeks   Status New               Plan - Dec 26, 2014 4854    Clinical Impression Statement pt presents with R hip pain that occurs during hip flexion acitivities. he demonstrates R hip weakness of 3-/5 secondary to pain.  AROM/PROM BIL is WFL except for R hip flexion due to pain/weakness and increased postural sway during SLS activities BIL with R>L. pt would benefit from skilled physical therapy to address the impairment listed   Pt will benefit from skilled therapeutic intervention in order to improve on the following deficits Decreased range of motion;Impaired  flexibility;Improper body mechanics;Decreased strength;Pain;Decreased balance;Decreased endurance   Rehab Potential Good   PT Frequency 2x / week   PT Duration 8 weeks   PT Treatment/Interventions ADLs/Self Care Home Management;Moist Heat;Therapeutic activities;Passive range of motion;Therapeutic exercise;Ultrasound;Balance training;Manual techniques;Neuromuscular re-education;Cryotherapy;Electrical Stimulation   PT Next Visit Plan work on R hip flex stretching and mobilization of R hip (THR on R)    PT Home Exercise Plan Hip flexor stretching, perform LEFS   Consulted and Agree with Plan of Care Patient          G-Codes - 12/26/2014 0945    Functional Assessment Tool Used Clincal judgment   Functional Limitation Mobility: Walking and moving around   Mobility: Walking and Moving Around Current Status [phone]) At least 40 percent but less than 60 percent impaired, limited or restricted   Mobility: Walking and Moving Around Goal Status [phone]) At least 1 percent but less than 20 percent impaired, limited or restricted   Mobility: Walking and Moving Around Discharge Status [phone]) At least 1 percent but less than 20 percent impaired, limited or restricted       Problem List Patient Active  Problem List   Diagnosis Date Noted  . Obesity (BMI 30-39.9) 06/02/2014  . CTS (carpal tunnel syndrome) 06/01/2014  . Varicose veins 04/07/2014  . Prolonged P-R interval 03/04/2013  . NEPHROLITHIASIS, HX OF 05/18/2010  . ATRIAL FIBRILLATION 06/20/2009  . CORONARY ARTERY BYPASS GRAFT, HX OF 06/20/2009  . PREMATURE VENTRICULAR CONTRACTIONS 05/09/2009  . DYSPNEA 05/09/2009  . MURMUR 05/08/2009  . Essential hypertension 04/10/2009  . DIVERTICULOSIS, COLON 04/10/2009  . G E R D 08/05/2008  . OBSTRUCTIVE SLEEP APNEA 08/05/2008  . DERMATOPHYTOSIS OF NAIL 07/29/2008  . Nocturia 05/30/2008  . HYPOTHYROIDISM 02/15/2008  . Elevated lipids 02/15/2008  . GOUT 02/15/2008  . DYSMETABOLIC SYNDROME X  64/15/8309  . COLONIC POLYPS, HX OF 01/14/2008   [name] PT, DPT, LAT, ATC  12/21/2014  10:02 AM      [place] [address] [place], [place], [address] Phone: [phone]   Fax:  [phone]

## 2014-12-21 NOTE — Patient Instructions (Signed)
Quads / HF, Prone   Lie face down, knees together. Grasp one ankle with same-side hand. Use towel if needed to reach. Gently pull foot toward buttock. Hold 30 seconds. Repeat 3 times per session. Do atleast 2 - 3 sessions per day.  Copyright  VHI. All rights reserved.

## 2014-12-22 ENCOUNTER — Ambulatory Visit: Payer: Medicare HMO | Admitting: Physical Therapy

## 2014-12-22 DIAGNOSIS — M25551 Pain in right hip: Secondary | ICD-10-CM | POA: Diagnosis not present

## 2014-12-22 DIAGNOSIS — R29898 Other symptoms and signs involving the musculoskeletal system: Secondary | ICD-10-CM

## 2014-12-22 DIAGNOSIS — M25669 Stiffness of unspecified knee, not elsewhere classified: Secondary | ICD-10-CM

## 2014-12-22 NOTE — Therapy (Signed)
[name], [place], [address] Phone: [phone]   Fax:  [phone]  Physical Therapy Treatment  Patient Details  Name: Steven Munoz MRN: [id number] Date of Birth: [birth year] Referring Provider:  Hendricks Limes, MD  Encounter Date: 12/22/2014      PT End of Session - 12/22/14 1020    Visit Number 2   Number of Visits 16   Date for PT Re-Evaluation 02/19/15   PT Start Time 0930   PT Stop Time 1018   PT Time Calculation (min) 48 min   Activity Tolerance Patient tolerated treatment well   Behavior During Therapy [facility] for tasks assessed/performed      Past Medical History  Diagnosis Date  . Murmur   . Hypothyroidism   . Hyperlipidemia   . Hypertension   . Diverticulosis   . GERD (gastroesophageal reflux disease)     S/P dilation  2005  . Sleep apnea     on CPAP; Dr [name]  . Dermatophytosis of nail   . Dysmetabolic syndrome   . Gout   . Colonic polyp 2002, 2005, 2009    [name]  . 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 Nephrolithiasis     X 1  . Pneumonia age 89    treated as inpatient    Past Surgical History  Procedure Laterality Date  . Coronary artery bypass graft  2010    Median sternotomy, extracorporeal circulation, coronary bypass graft surgery x3 using a left internal mammary artery graft to left anterior descending coronary artery, with a sequential  saphenous vein graft to posterior descending and posterolateral branches  of the right coronary artery.  Endoscopic vein harvesting from the right  . Athroscopy      right knee X2, left knee X1  . Colonoscopy  2002 & 2009 &2014    polypectomy; [name]  . Replacement total knee      right 2003  . Replacement total knee      partial Left March 2009  . Nose surgery  11/20/12    L max antrectomy;septoplasty;turbinate reduction. [name]  . Nail alvusion Bilateral     Hallux  . 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  Visit Diagnosis:  Right hip pain  Weakness of right hip  Decreased ROM of lower extremity      Subjective Assessment - 12/22/14 0932    Symptoms pt reports that he did his stretches a few more times since yesterday noted some tightness in his proximal medial knee/thigh. he states that he feels about the same if not a little better since yesterday.   Currently in Pain? Yes   Pain Score 5    Pain Location --  hip / groin   Pain Orientation Right   Pain Frequency Intermittent                    [facility] Adult PT Treatment/Exercise - 12/22/14 0935    Knee/Hip Exercises: Aerobic   Stationary Bike L2 x 8 min   Knee/Hip Exercises: Supine   Short Arc Quad Sets 2 sets;10 reps;Right;Strengthening;AROM   Bridges Both;10 reps;2 sets;Strengthening;AROM  with Green Theraband wrapped around knees isometric abd   Knee/Hip Exercises: Sidelying   Clams 2 x 10 reps with Nyoka Cowden theraband  performed in supine   Knee/Hip Exercises: Prone   Hip Extension 2 sets;10 reps;AROM;Strengthening;Right  With hamstring curl hold for 2 seconds   Manual Therapy   Manual Therapy Joint mobilization   Joint Mobilization R hip post/Ant oscillations grade 2-3 x 15 reps, R hip Distraction grade 2 osscilations x 15 reps                PT Education - 12/22/14 1019    Education provided Yes   Education Details HEP   Person(s) Educated Patient   Methods Explanation   Comprehension Verbalized understanding          PT Short Term Goals - 12-29-2014 0935    PT SHORT TERM GOAL #1   Title to be independet with basic HEP   Time 4   Period Weeks   Status New   PT SHORT TERM GOAL #2   Title to decrease pain to < 3/10 during hip flexion activities to promote funtional progression   Time 4   Period Weeks   Status New   PT SHORT  TERM GOAL #3   Title to increase R hip Strength to > 3+/5 to assist with funcitonal progression   Time 4   Period Weeks           PT Long Term Goals - 12-29-2014 5284    PT LONG TERM GOAL #1   Title To be independent with advanced HEP   Time 8   Period Weeks   Status New   PT LONG TERM GOAL #2   Title to decreae pain to < 2/10 to help with ADLs    Time 8   Period Weeks   Status New   PT LONG TERM GOAL #3   Title To increase R hip strength to >4/5 without pain to help with donning and doffing clothing   Time 8   Period Weeks   Status New   PT LONG TERM GOAL #4   Title To verbalize and demonstrate techniques to prevent reinjury to R hip including stretching, strengthening and lifting mechanics   Time 8   Period Weeks   Status New               Plan - 12/22/14 1021    Clinical Impression Statement He continues to demonstrate pain during seated R hip flexion that limits his AROM. Following treament he was able to perform x 10 SLR following grade 2 R hip distractionswith 1-2/10 pain compared to  5/10 with activitity prior to todays visit.     PT Next Visit Plan work on R hip flex stretching and mobilization of R hip (THR on R)           G-Codes - Dec 29, 2014 0945    Functional Assessment Tool Used Clincal judgment   Functional Limitation Mobility: Walking and moving around   Mobility: Walking and Moving Around Current Status [phone]) At least 40 percent but less than 60 percent impaired, limited or restricted   Mobility: Walking and Moving Around Goal Status [phone]) At least 1 percent but less than 20 percent impaired, limited or restricted   Mobility: Walking and Moving Around Discharge Status [phone]) At least 1 percent but less than 20 percent impaired, limited or restricted      Problem List Patient Active Problem List   Diagnosis Date Noted  . Obesity (BMI 30-39.9) 06/02/2014  . CTS (carpal tunnel syndrome) 06/01/2014  . Varicose veins 04/07/2014  . Prolonged  P-R interval 03/04/2013  .  NEPHROLITHIASIS, HX OF 05/18/2010  . ATRIAL FIBRILLATION 06/20/2009  . CORONARY ARTERY BYPASS GRAFT, HX OF 06/20/2009  . PREMATURE VENTRICULAR CONTRACTIONS 05/09/2009  . DYSPNEA 05/09/2009  . MURMUR 05/08/2009  . Essential hypertension 04/10/2009  . DIVERTICULOSIS, COLON 04/10/2009  . G E R D 08/05/2008  . OBSTRUCTIVE SLEEP APNEA 08/05/2008  . DERMATOPHYTOSIS OF NAIL 07/29/2008  . Nocturia 05/30/2008  . HYPOTHYROIDISM 02/15/2008  . Elevated lipids 02/15/2008  . GOUT 02/15/2008  . DYSMETABOLIC SYNDROME X 55/97/4163  . COLONIC POLYPS, HX OF 01/14/2008   [name] PT, DPT, LAT, ATC  12/22/2014  10:29 AM    [facility] [address] [place], [place], [address] Phone: [phone]   Fax:  [phone]

## 2014-12-26 ENCOUNTER — Ambulatory Visit: Payer: Medicare HMO | Admitting: Physical Therapy

## 2014-12-26 DIAGNOSIS — M25669 Stiffness of unspecified knee, not elsewhere classified: Secondary | ICD-10-CM

## 2014-12-26 DIAGNOSIS — M25551 Pain in right hip: Secondary | ICD-10-CM | POA: Diagnosis not present

## 2014-12-26 DIAGNOSIS — R29898 Other symptoms and signs involving the musculoskeletal system: Secondary | ICD-10-CM

## 2014-12-26 NOTE — Therapy (Signed)
[place], [place], [address] Phone: [phone]   Fax:  [phone]  Physical Therapy Treatment  Patient Details  Name: Steven Munoz MRN: [id number] Date of Birth: 14-Jul-1941 Referring Provider:  Hendricks Limes, MD  Encounter Date: 12/26/2014      PT End of Session - 12/26/14 1105    Visit Number 3   Number of Visits 16   Date for PT Re-Evaluation 02/19/15   PT Start Time 8921   PT Stop Time 1102   PT Time Calculation (min) 47 min   Activity Tolerance Patient tolerated treatment well;Patient limited by pain   Behavior During Therapy [facility] for tasks assessed/performed      Past Medical History  Diagnosis Date  . Murmur   . Hypothyroidism   . Hyperlipidemia   . Hypertension   . Diverticulosis   . GERD (gastroesophageal reflux disease)     S/P dilation  2005  . Sleep apnea     on CPAP; Dr [name]  . Dermatophytosis of nail   . Dysmetabolic syndrome   . Gout   . Colonic polyp 2002, 2005, 2009    [name]  . 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 Nephrolithiasis     X 1  . Pneumonia age 22    treated as inpatient    Past Surgical History  Procedure Laterality Date  . Coronary artery bypass graft  2010    Median sternotomy, extracorporeal circulation, coronary bypass graft surgery x3 using a left internal mammary artery graft to left anterior descending coronary artery, with a sequential  saphenous vein graft to posterior descending and posterolateral branches  of the right coronary artery.  Endoscopic vein harvesting from the right  . Athroscopy      right knee X2, left knee X1  . Colonoscopy  2002 & 2009 &2014    polypectomy; [name]  . Replacement total knee      right 2003  . Replacement total knee      partial Left March 2009  . Nose surgery  11/20/12    L max antrectomy;septoplasty;turbinate  reduction. [name]  . Nail alvusion Bilateral     Hallux  . 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  Visit Diagnosis:  Right hip pain  Weakness of right hip  Decreased ROM of lower extremity      Subjective Assessment - 12/26/14 1020    Symptoms He states that he has had some increased pain in the medial aspect of his R knee and continues to have some irritation in the R hip/groin with hip flexion, He states it has gotten a little better since the last visit.    Currently in Pain? Yes   Pain Score 5    Pain Location Hip   Pain Orientation Right   Pain Onset More than a month ago   Pain Frequency Intermittent                    [facility] Adult PT Treatment/Exercise - 12/26/14 1022    Knee/Hip Exercises: Aerobic   Stationary Bike L3 x 8 min   Knee/Hip Exercises: Machines for Strengthening   Cybex Leg Press 1 set 60# x 15 reps, 1 set 100# x 15 reps   Knee/Hip Exercises: Standing  Knee Flexion AROM;Right;2 sets;10 reps  on 4 way hip machine   Knee/Hip Exercises: Supine   Straight Leg Raises AROM;Strengthening;Right;2 sets;15 reps   Knee Flexion AROM;Strengthening;Both;3 sets;15 reps  with physioball underheels and pulling knees to chest   Manual Therapy   Manual Therapy Joint mobilization   Joint Mobilization R hip post/Ant oscillations grade 2-3 x 15 reps, R hip Distraction grade 2 osscilations x 15 reps                PT Education - 12/26/14 1104    Education provided Yes   Education Details Stretching / HEP   Person(s) Educated Patient   Methods Explanation   Comprehension Verbalized understanding          PT Short Term Goals - 12/21/14 0935    PT SHORT TERM GOAL #1   Title to be independet with basic HEP   Time 4   Period Weeks   Status New   PT SHORT TERM GOAL #2   Title to decrease pain to < 3/10 during hip flexion activities to promote funtional  progression   Time 4   Period Weeks   Status New   PT SHORT TERM GOAL #3   Title to increase R hip Strength to > 3+/5 to assist with funcitonal progression   Time 4   Period Weeks           PT Long Term Goals - 12/21/14 6301    PT LONG TERM GOAL #1   Title To be independent with advanced HEP   Time 8   Period Weeks   Status New   PT LONG TERM GOAL #2   Title to decreae pain to < 2/10 to help with ADLs    Time 8   Period Weeks   Status New   PT LONG TERM GOAL #3   Title To increase R hip strength to >4/5 without pain to help with donning and doffing clothing   Time 8   Period Weeks   Status New   PT LONG TERM GOAL #4   Title To verbalize and demonstrate techniques to prevent reinjury to R hip including stretching, strengthening and lifting mechanics   Time 8   Period Weeks   Status New               Plan - 12/26/14 1106    Clinical Impression Statement He continues to demonstrate pain in the R hip/groin noted during seated/standing hip flexion activities, After performing a couple reps he reports the pain feeling better. pt is able to perform straight leg raise activities with minimal discomfort but when performing bent knee hip flexion he experiences increased pain possibly due to impingement of the R iliopsoas muscle.    PT Next Visit Plan  mobilization of R hip (THR on R), hip muscle strengthening.        Problem List Patient Active Problem List   Diagnosis Date Noted  . Obesity (BMI 30-39.9) 06/02/2014  . CTS (carpal tunnel syndrome) 06/01/2014  . Varicose veins 04/07/2014  . Prolonged P-R interval 03/04/2013  . NEPHROLITHIASIS, HX OF 05/18/2010  . ATRIAL FIBRILLATION 06/20/2009  . CORONARY ARTERY BYPASS GRAFT, HX OF 06/20/2009  . PREMATURE VENTRICULAR CONTRACTIONS 05/09/2009  . DYSPNEA 05/09/2009  . MURMUR 05/08/2009  . Essential hypertension 04/10/2009  . DIVERTICULOSIS, COLON 04/10/2009  . G E R D 08/05/2008  . OBSTRUCTIVE SLEEP APNEA  08/05/2008  . DERMATOPHYTOSIS OF NAIL 07/29/2008  . Nocturia 05/30/2008  .  HYPOTHYROIDISM 02/15/2008  . Elevated lipids 02/15/2008  . GOUT 02/15/2008  . DYSMETABOLIC SYNDROME X 31/59/4585  . COLONIC POLYPS, HX OF 01/14/2008   [name] PT, DPT, LAT, ATC  12/26/2014  11:14 AM   [facility] [address] [place], [place], [address] Phone: [phone]   Fax:  [phone]

## 2014-12-28 ENCOUNTER — Ambulatory Visit: Payer: Medicare HMO | Attending: Orthopaedic Surgery | Admitting: Physical Therapy

## 2014-12-28 DIAGNOSIS — M25551 Pain in right hip: Secondary | ICD-10-CM | POA: Insufficient documentation

## 2014-12-28 DIAGNOSIS — E785 Hyperlipidemia, unspecified: Secondary | ICD-10-CM | POA: Insufficient documentation

## 2014-12-28 DIAGNOSIS — I4891 Unspecified atrial fibrillation: Secondary | ICD-10-CM | POA: Diagnosis not present

## 2014-12-28 DIAGNOSIS — E669 Obesity, unspecified: Secondary | ICD-10-CM | POA: Diagnosis not present

## 2014-12-28 DIAGNOSIS — G4730 Sleep apnea, unspecified: Secondary | ICD-10-CM | POA: Diagnosis not present

## 2014-12-28 DIAGNOSIS — I10 Essential (primary) hypertension: Secondary | ICD-10-CM | POA: Diagnosis not present

## 2014-12-28 DIAGNOSIS — E039 Hypothyroidism, unspecified: Secondary | ICD-10-CM | POA: Insufficient documentation

## 2014-12-28 DIAGNOSIS — Z8639 Personal history of other endocrine, nutritional and metabolic disease: Secondary | ICD-10-CM | POA: Diagnosis not present

## 2014-12-28 DIAGNOSIS — R29898 Other symptoms and signs involving the musculoskeletal system: Secondary | ICD-10-CM | POA: Diagnosis not present

## 2014-12-28 DIAGNOSIS — K219 Gastro-esophageal reflux disease without esophagitis: Secondary | ICD-10-CM | POA: Diagnosis not present

## 2014-12-28 DIAGNOSIS — M25669 Stiffness of unspecified knee, not elsewhere classified: Secondary | ICD-10-CM

## 2014-12-28 DIAGNOSIS — Z96641 Presence of right artificial hip joint: Secondary | ICD-10-CM | POA: Diagnosis not present

## 2014-12-28 NOTE — Therapy (Signed)
[place], [place], [address] Phone: [phone]   Fax:  [phone]  Physical Therapy Treatment  Patient Details  Name: Steven Munoz MRN: [id number] Date of Birth: 1941/03/18 Referring Provider:  Hendricks Limes, Munoz  Encounter Date: 12/28/2014      PT End of Session - 12/28/14 1058    Visit Number 4   Number of Visits 16   Date for PT Re-Evaluation 02/19/15   PT Start Time 3785   PT Stop Time 1100   PT Time Calculation (min) 45 min   Activity Tolerance Patient tolerated treatment well;Patient limited by pain   Behavior During Therapy Steven Munoz for tasks assessed/performed      Past Medical History  Diagnosis Date  . Murmur   . Hypothyroidism   . Hyperlipidemia   . Hypertension   . Diverticulosis   . GERD (gastroesophageal reflux disease)     S/P dilation  2005  . Sleep apnea     on CPAP; Dr Elsworth Munoz  . Dermatophytosis of nail   . Dysmetabolic syndrome   . Gout   . Colonic polyp 2002, 2005, 2009    Dr Steven Munoz  . 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 Nephrolithiasis     X 1  . Pneumonia age 48    treated as inpatient    Past Surgical History  Procedure Laterality Date  . Coronary artery bypass graft  2010    Median sternotomy, extracorporeal circulation, coronary bypass graft surgery x3 using a left internal mammary artery graft to left anterior descending coronary artery, with a sequential  saphenous vein graft to posterior descending and posterolateral branches  of the right coronary artery.  Endoscopic vein harvesting from the right  . Athroscopy      right knee X2, left knee X1  . Colonoscopy  2002 & 2009 &2014    polypectomy; Dr Steven Munoz  . Replacement total knee      right 2003  . Replacement total knee      partial Left March 2009  . Nose surgery  11/20/12    L max antrectomy;septoplasty;turbinate  reduction. Dr Steven Munoz  . Nail alvusion Bilateral     Hallux  . 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  Visit Diagnosis:  Right hip pain  Weakness of right hip  Decreased ROM of lower extremity      Subjective Assessment - 12/28/14 1018    Symptoms pt reports having some increased pain in the low back yesterday with some tightness in the R medial thigh just above the knee.  He states the R hip has felt alittlle better but is still sore with hip flexion past a certain point.   Currently in Pain? Yes   Pain Score 2    Pain Location Hip   Pain Orientation Anterior;Right   Pain Descriptors / Indicators Stabbing;Sharp   Pain Type Chronic pain   Pain Onset More than a month ago                    [facility] Adult PT Treatment/Exercise - 12/28/14 1020    Knee/Hip Exercises: Stretches   Passive Hamstring Stretch 2 reps;30 seconds  L    Hip Flexor Stretch 4 reps;30 seconds  with 10 second contract/relax PNF techniques  Knee/Hip Exercises: Aerobic   Stationary Bike L4 x 8 min   Knee/Hip Exercises: Standing   Other Standing Knee Exercises steam boats 4-way x 20 ea. with Red theraband   Knee/Hip Exercises: Supine   Straight Leg Raises AROM;Strengthening;Right;2 sets;15 reps;Other (comment)  x 10 AAROM with manual distraciton   Knee Flexion AROM;Strengthening;Both;3 sets;15 reps  on physioball with distraction mob using belt.    Manual Therapy   Manual Therapy Joint mobilization   Joint Mobilization R hip post/Ant oscillations grade 2-3 x 15 reps, R hip Distraction grade 2 osscilations x 15 reps                PT Education - 12/28/14 1220    Education provided Yes   Education Details HEP, Stretching, use of ice prn for pain   Person(s) Educated Patient   Methods Explanation   Comprehension Verbalized understanding;Other (comment)          PT Short Term Goals - 12/28/14  1225    PT SHORT TERM GOAL #1   Title to be independet with basic HEP   Time 4   Period Weeks   Status On-going   PT SHORT TERM GOAL #2   Title to decrease pain to < 3/10 during hip flexion activities to promote funtional progression   Time 4   Period Weeks   Status On-going   PT SHORT TERM GOAL #3   Title to increase R hip Strength to > 3+/5 to assist with funcitonal progression   Time 4   Period Weeks   Status On-going           PT Long Term Goals - 12/28/14 1225    PT LONG TERM GOAL #1   Title To be independent with advanced HEP   Time 8   Period Weeks   Status On-going   PT LONG TERM GOAL #2   Title to decreae pain to < 2/10 to help with ADLs    Time 8   Period Weeks   Status On-going   PT LONG TERM GOAL #3   Title To increase R hip strength to >4/5 without pain to help with donning and doffing clothing   Time 8   Period Weeks   Status On-going   PT LONG TERM GOAL #4   Title To verbalize and demonstrate techniques to prevent reinjury to R hip including stretching, strengthening and lifting mechanics   Time 8   Period Weeks   Status On-going               Plan - 12/28/14 1220    Clinical Impression Statement Steven Munoz has made some progess with decreased pain in the R hip until he gets to about 30 degrees of flexion.  passive hip flexion continues to be pain free, he only exhibits pain when he activities the R hip flexors.  He was able to perform  AAROM SLR with manual distraction without pain which seems to be consistent with impingment of the r hip flexors.         Problem List Patient Active Problem List   Diagnosis Date Noted  . Obesity (BMI 30-39.9) 06/02/2014  . CTS (carpal tunnel syndrome) 06/01/2014  . Varicose veins 04/07/2014  . Prolonged P-R interval 03/04/2013  . NEPHROLITHIASIS, HX OF 05/18/2010  . ATRIAL FIBRILLATION 06/20/2009  . CORONARY ARTERY BYPASS GRAFT, HX OF 06/20/2009  . PREMATURE VENTRICULAR CONTRACTIONS 05/09/2009  .  DYSPNEA 05/09/2009  . MURMUR 05/08/2009  . Essential hypertension 04/10/2009  .  DIVERTICULOSIS, COLON 04/10/2009  . G E R D 08/05/2008  . OBSTRUCTIVE SLEEP APNEA 08/05/2008  . DERMATOPHYTOSIS OF NAIL 07/29/2008  . Nocturia 05/30/2008  . HYPOTHYROIDISM 02/15/2008  . Elevated lipids 02/15/2008  . GOUT 02/15/2008  . DYSMETABOLIC SYNDROME X 81/59/4707  . COLONIC POLYPS, HX OF 01/14/2008   [name] PT, DPT, LAT, ATC  12/28/2014  12:27 PM  [facility] [address] [place], [place], [address] Phone: [phone]   Fax:  [phone]

## 2015-01-02 ENCOUNTER — Ambulatory Visit: Payer: Medicare HMO | Admitting: Physical Therapy

## 2015-01-02 DIAGNOSIS — M25551 Pain in right hip: Secondary | ICD-10-CM | POA: Diagnosis not present

## 2015-01-02 DIAGNOSIS — M25669 Stiffness of unspecified knee, not elsewhere classified: Secondary | ICD-10-CM

## 2015-01-02 DIAGNOSIS — R29898 Other symptoms and signs involving the musculoskeletal system: Secondary | ICD-10-CM

## 2015-01-02 NOTE — Therapy (Signed)
[place], [place], [address] Phone: [phone]   Fax:  [phone]  Physical Therapy Treatment  Patient Details  Name: Steven Munoz MRN: [id number] Date of Birth: 19-Aug-1941 Referring Provider:  Hendricks Limes, MD  Encounter Date: 01/02/2015      PT End of Session - 01/02/15 1300    Visit Number 5   Number of Visits 16   Date for PT Re-Evaluation 02/19/15   PT Start Time 0086   PT Stop Time 1100   PT Time Calculation (min) 45 min   Activity Tolerance Patient tolerated treatment well   Behavior During Therapy [facility] for tasks assessed/performed      Past Medical History  Diagnosis Date  . Murmur   . Hypothyroidism   . Hyperlipidemia   . Hypertension   . Diverticulosis   . GERD (gastroesophageal reflux disease)     S/P dilation  2005  . Sleep apnea     on CPAP; Dr [name]  . Dermatophytosis of nail   . Dysmetabolic syndrome   . Gout   . Colonic polyp 2002, 2005, 2009    [name]  . 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 Nephrolithiasis     X 1  . Pneumonia age 74    treated as inpatient    Past Surgical History  Procedure Laterality Date  . Coronary artery bypass graft  2010    Median sternotomy, extracorporeal circulation, coronary bypass graft surgery x3 using a left internal mammary artery graft to left anterior descending coronary artery, with a sequential  saphenous vein graft to posterior descending and posterolateral branches  of the right coronary artery.  Endoscopic vein harvesting from the right  . Athroscopy      right knee X2, left knee X1  . Colonoscopy  2002 & 2009 &2014    polypectomy; [name]  . Replacement total knee      right 2003  . Replacement total knee      partial Left March 2009  . Nose surgery  11/20/12    L max antrectomy;septoplasty;turbinate reduction. [name]  . Nail alvusion Bilateral     Hallux  . 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  Visit Diagnosis:  Right hip pain  Weakness of right hip  Decreased ROM of lower extremity      Subjective Assessment - 01/02/15 1019    Symptoms pt states that the hip has been feeling better and that he has been keeping up with his hip stretches and was able to do some straight leg raises without much pain.    Currently in Pain? Yes   Pain Score 1    Pain Location Groin   Pain Orientation Right;Medial   Pain Descriptors / Indicators Aching   Pain Type Chronic pain   Multiple Pain Sites Yes   Pain Location Back  left low back 5/10 when getting out of bed                    [facility] Adult PT Treatment/Exercise - 01/02/15 1027    Knee/Hip Exercises: Stretches   Passive Hamstring Stretch 2 reps;30 seconds   Hip Flexor Stretch 4 reps;30 seconds  contract and relax  technique   Knee/Hip Exercises: Aerobic   Elliptical  level 1 x 8 minutes    Knee/Hip Exercises: Standing   Wall Squat 1 set;15 reps  focusing on maintaining nuetral positon wihtout leaning   Knee/Hip Exercises: Supine   Bridges Both;Strengthening;AROM;1 set;15 reps;Other (comment)  with yellow theraball between knees   Straight Leg Raises AROM;Strengthening;Right;15 reps;Other (comment);1 set  with eccentric control   Manual Therapy   Manual Therapy Joint mobilization   Joint Mobilization R hip post/Ant oscillations grade 2-3 x 15 reps, R hip Distraction grade 2 osscilations x 15 reps                  PT Short Term Goals - 01/02/15 1304    PT SHORT TERM GOAL #1   Title to be independet with basic HEP   Time 4   Period Weeks   Status Achieved           PT Long Term Goals - 12/28/14 1225    PT LONG TERM GOAL #1   Title To be independent with advanced HEP   Time 8   Period Weeks   Status On-going   PT LONG TERM  GOAL #2   Title to decreae pain to < 2/10 to help with ADLs    Time 8   Period Weeks   Status On-going   PT LONG TERM GOAL #3   Title To increase R hip strength to >4/5 without pain to help with donning and doffing clothing   Time 8   Period Weeks   Status On-going   PT LONG TERM GOAL #4   Title To verbalize and demonstrate techniques to prevent reinjury to R hip including stretching, strengthening and lifting mechanics   Time 8   Period Weeks   Status On-going               Plan - 01/02/15 1301    Clinical Impression Statement Steven Munoz continues to make progress with decreased R hip/groin pain throughout the day following the hip flexor stretches.  He has been able to perform the elliptical and  straight leg raises with more contorl and less pain indicating improvement from previous sessions.  He has indicated that he has some pain in the  L low back  and R medial  knee.         Problem List Patient Active Problem List   Diagnosis Date Noted  . Obesity (BMI 30-39.9) 06/02/2014  . CTS (carpal tunnel syndrome) 06/01/2014  . Varicose veins 04/07/2014  . Prolonged P-R interval 03/04/2013  . NEPHROLITHIASIS, HX OF 05/18/2010  . ATRIAL FIBRILLATION 06/20/2009  . CORONARY ARTERY BYPASS GRAFT, HX OF 06/20/2009  . PREMATURE VENTRICULAR CONTRACTIONS 05/09/2009  . DYSPNEA 05/09/2009  . MURMUR 05/08/2009  . Essential hypertension 04/10/2009  . DIVERTICULOSIS, COLON 04/10/2009  . G E R D 08/05/2008  . OBSTRUCTIVE SLEEP APNEA 08/05/2008  . DERMATOPHYTOSIS OF NAIL 07/29/2008  . Nocturia 05/30/2008  . HYPOTHYROIDISM 02/15/2008  . Elevated lipids 02/15/2008  . GOUT 02/15/2008  . DYSMETABOLIC SYNDROME X 44/10/270  . COLONIC POLYPS, HX OF 01/14/2008   [name] PT, DPT, LAT, ATC  01/02/2015  1:05 PM  [facility] [address] [place], [place], [address] Phone: [phone]   Fax:  [phone]

## 2015-01-04 ENCOUNTER — Ambulatory Visit: Payer: Medicare HMO | Admitting: Physical Therapy

## 2015-01-04 DIAGNOSIS — M25669 Stiffness of unspecified knee, not elsewhere classified: Secondary | ICD-10-CM

## 2015-01-04 DIAGNOSIS — R29898 Other symptoms and signs involving the musculoskeletal system: Secondary | ICD-10-CM

## 2015-01-04 DIAGNOSIS — M25551 Pain in right hip: Secondary | ICD-10-CM | POA: Diagnosis not present

## 2015-01-04 NOTE — Therapy (Signed)
[place], [place], [address] Phone: [phone]   Fax:  [phone]  Physical Therapy Treatment  Patient Details  Name: Steven Munoz MRN: [id number] Date of Birth: Aug 08, 1941 Referring Provider:  Hendricks Limes, MD  Encounter Date: 01/04/2015      PT End of Session - 01/04/15 1100    Visit Number 6   Number of Visits 16   Date for PT Re-Evaluation 02/19/15   PT Start Time 5035   PT Stop Time 1100   PT Time Calculation (min) 45 min   Activity Tolerance Patient tolerated treatment well   Behavior During Therapy [facility] for tasks assessed/performed      Past Medical History  Diagnosis Date  . Murmur   . Hypothyroidism   . Hyperlipidemia   . Hypertension   . Diverticulosis   . GERD (gastroesophageal reflux disease)     S/P dilation  2005  . Sleep apnea     on CPAP; Dr [name]  . Dermatophytosis of nail   . Dysmetabolic syndrome   . Gout   . Colonic polyp 2002, 2005, 2009    [name]  . 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 Nephrolithiasis     X 1  . Pneumonia age 74    treated as inpatient    Past Surgical History  Procedure Laterality Date  . Coronary artery bypass graft  2010    Median sternotomy, extracorporeal circulation, coronary bypass graft surgery x3 using a left internal mammary artery graft to left anterior descending coronary artery, with a sequential  saphenous vein graft to posterior descending and posterolateral branches  of the right coronary artery.  Endoscopic vein harvesting from the right  . Athroscopy      right knee X2, left knee X1  . Colonoscopy  2002 & 2009 &2014    polypectomy; [name]  . Replacement total knee      right 2003  . Replacement total knee      partial Left March 2009  . Nose surgery  11/20/12    L max antrectomy;septoplasty;turbinate reduction. [name]  . Nail alvusion Bilateral     Hallux  . 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  Visit Diagnosis:  Right hip pain  Weakness of right hip  Decreased ROM of lower extremity      Subjective Assessment - 01/04/15 1017    Symptoms pt states that his hip has gotten better since the last visit but still is sore with hiking his hip. He reports his back was a little sore this morning but hasn't got worse    Currently in Pain? Yes   Pain Score 1    Pain Location Groin   Pain Orientation Right;Medial   Pain Descriptors / Indicators Aching   Pain Type Chronic pain   Pain Frequency Intermittent                    [facility] Adult PT Treatment/Exercise - 01/04/15 1021    Knee/Hip Exercises: Stretches   Passive Hamstring Stretch 2 reps;30 seconds   Hip Flexor Stretch 4 reps;30 seconds  PNF contract-relax technique hold for 10 seconds   Knee/Hip Exercises: Aerobic   Elliptical level 1 x 8 minutes    Knee/Hip Exercises: Standing  Other Standing Knee Exercises hip hikes  on step 2 x 10   Other Standing Knee Exercises sit to stand  2 x 10 with eccentric control   Knee/Hip Exercises: Supine   Bridges Both;Strengthening;AROM;1 set;15 reps;Other (comment)   Straight Leg Raises AROM;Strengthening;Right;15 reps;Other (comment);1 set   Other Supine Knee Exercises butterfly stretch  3x 30 sec   Knee/Hip Exercises: Sidelying   Hip ABduction AROM;Strengthening;Right;2 sets;10 reps   Hip ABduction Limitations has difficulty going farther than 8 inches high   Manual Therapy   Manual Therapy Joint mobilization   Joint Mobilization R hip post/Ant oscillations grade 2-3 x 15 reps, R hip Distraction grade 2 osscilations x 15 reps                PT Education - 01/04/15 1100    Education provided Yes   Education Details HEP   Person(s) Educated Patient   Methods Explanation   Comprehension  Verbalized understanding          PT Short Term Goals - 01/04/15 1104    PT SHORT TERM GOAL #1   Title to be independet with basic HEP   Time 4   Period Weeks   Status Achieved   PT SHORT TERM GOAL #2   Title to decrease pain to < 3/10 during hip flexion activities to promote funtional progression   Time 4   Period Weeks   Status On-going   PT SHORT TERM GOAL #3   Title to increase R hip Strength to > 3+/5 to assist with funcitonal progression   Time 4   Period Weeks   Status On-going           PT Long Term Goals - 01/04/15 1105    PT LONG TERM GOAL #1   Title To be independent with advanced HEP   Time 8   Period Weeks   Status On-going   PT LONG TERM GOAL #2   Title to decreae pain to < 2/10 to help with ADLs    Time 8   Period Weeks   Status On-going   PT LONG TERM GOAL #3   Title To increase R hip strength to >4/5 without pain to help with donning and doffing clothing   Time 8   Status On-going   PT LONG TERM GOAL #4   Title To verbalize and demonstrate techniques to prevent reinjury to R hip including stretching, strengthening and lifting mechanics   Time 8   Period Weeks   Status On-going               Plan - 01/04/15 1101    Clinical Impression Statement Deontay continues to exhibit pain in the R hip during hip flexion/hiking activities.  Focused todays treatment on Stretching of the surrounding hip musuclature and strengthening of the abductors due to difficulty abucting hip in sidelying beyond 8-10 inches.   PT Next Visit Plan mobilization of R hip, hip abductor strengthening   PT Home Exercise Plan hip hikes, and sidelying abduction        Problem List Patient Active Problem List   Diagnosis Date Noted  . Obesity (BMI 30-39.9) 06/02/2014  . CTS (carpal tunnel syndrome) 06/01/2014  . Varicose veins 04/07/2014  . Prolonged P-R interval 03/04/2013  . NEPHROLITHIASIS, HX OF 05/18/2010  . ATRIAL FIBRILLATION 06/20/2009  . CORONARY ARTERY  BYPASS GRAFT, HX OF 06/20/2009  . PREMATURE VENTRICULAR CONTRACTIONS 05/09/2009  . DYSPNEA 05/09/2009  . MURMUR 05/08/2009  . Essential hypertension  04/10/2009  . DIVERTICULOSIS, COLON 04/10/2009  . G E R D 08/05/2008  . OBSTRUCTIVE SLEEP APNEA 08/05/2008  . DERMATOPHYTOSIS OF NAIL 07/29/2008  . Nocturia 05/30/2008  . HYPOTHYROIDISM 02/15/2008  . Elevated lipids 02/15/2008  . GOUT 02/15/2008  . DYSMETABOLIC SYNDROME X 25/95/6387  . COLONIC POLYPS, HX OF 01/14/2008   [name] PT, DPT, LAT, ATC  01/04/2015  4:11 PM   [facility] [address] [place], [place], [address] Phone: [phone]   Fax:  [phone]

## 2015-01-04 NOTE — Patient Instructions (Signed)
Abduction   Lift leg up toward ceiling. Return. Repeat 10 times x 2 sets . Do 1 sessions per day.  http://gt2.exer.us/385   Copyright  VHI. All rights reserved.   Hip Abduction: Step-Down (Eccentric)   Stand, affected leg on step. Raise other foot, keeping leg straight, hiking hip. Slowly lower heel toward floor for 3-5 seconds. 10 reps per set, 2 sets per day,  5 days per week.  Copyright  VHI. All rights reserved.

## 2015-01-10 ENCOUNTER — Ambulatory Visit: Payer: Medicare HMO | Admitting: Physical Therapy

## 2015-01-11 ENCOUNTER — Ambulatory Visit: Payer: Medicare HMO | Admitting: Physical Therapy

## 2015-01-16 ENCOUNTER — Ambulatory Visit: Payer: Medicare HMO | Admitting: Physical Therapy

## 2015-01-16 DIAGNOSIS — M25551 Pain in right hip: Secondary | ICD-10-CM

## 2015-01-16 DIAGNOSIS — R29898 Other symptoms and signs involving the musculoskeletal system: Secondary | ICD-10-CM

## 2015-01-16 DIAGNOSIS — M25669 Stiffness of unspecified knee, not elsewhere classified: Secondary | ICD-10-CM

## 2015-01-16 NOTE — Therapy (Signed)
[place], [place], [address] Phone: [phone]   Fax:  [phone]  Physical Therapy Treatment  Patient Details  Name: Steven Munoz MRN: [id number] Date of Birth: 1941/06/01 Referring Provider:  Hendricks Limes, MD  Encounter Date: 01/16/2015      PT End of Session - 01/16/15 1435    Visit Number 7   Number of Visits 16   Date for PT Re-Evaluation 02/19/15   PT Start Time 5631   PT Stop Time 1100   PT Time Calculation (min) 45 min   Activity Tolerance Patient tolerated treatment well   Behavior During Therapy [facility] for tasks assessed/performed      Past Medical History  Diagnosis Date  . Murmur   . Hypothyroidism   . Hyperlipidemia   . Hypertension   . Diverticulosis   . GERD (gastroesophageal reflux disease)     S/P dilation  2005  . Sleep apnea     on CPAP; Dr [name]  . Dermatophytosis of nail   . Dysmetabolic syndrome   . Gout   . Colonic polyp 2002, 2005, 2009    [name]  . 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 Nephrolithiasis     X 1  . Pneumonia age 28    treated as inpatient    Past Surgical History  Procedure Laterality Date  . Coronary artery bypass graft  2010    Median sternotomy, extracorporeal circulation, coronary bypass graft surgery x3 using a left internal mammary artery graft to left anterior descending coronary artery, with a sequential  saphenous vein graft to posterior descending and posterolateral branches  of the right coronary artery.  Endoscopic vein harvesting from the right  . Athroscopy      right knee X2, left knee X1  . Colonoscopy  2002 & 2009 &2014    polypectomy; [name]  . Replacement total knee      right 2003  . Replacement total knee      partial Left March 2009  . Nose surgery  11/20/12    L max antrectomy;septoplasty;turbinate reduction. [name]  . Nail alvusion Bilateral     Hallux  . 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  Visit Diagnosis:  Right hip pain  Weakness of right hip  Decreased ROM of lower extremity      Subjective Assessment - 01/16/15 1018    Symptoms he states that he has had some congestive issues that caused him to cancel his last 2 visits. He reports the low back is feeling better. Most of the pain is in the R anterior thigh/hip.   Currently in Pain? Yes   Pain Score 4    Pain Location Groin   Pain Orientation Right;Medial   Pain Descriptors / Indicators Aching   Pain Type Chronic pain   Pain Onset More than a month ago   Pain Type Chronic pain   Pain Location Other (Comment)  Quadricep   Pain Frequency Intermittent                       [facility] Adult PT Treatment/Exercise - 01/16/15 1021    Knee/Hip Exercises: Stretches   Passive Hamstring Stretch 2 reps;30 seconds   Hip Flexor Stretch 4 reps;30 seconds;Other (comment)  PNF technique with contract/relax x 10 sec   Knee/Hip Exercises: Aerobic   Elliptical level 1 x 8 minutes    Tread Mill L5 x 6 min    Knee/Hip Exercises: Standing   Lateral Step Up --   Other Standing Knee Exercises hip hikes  on 2 inch step x 15   Other Standing Knee Exercises sit to stand  x 10 with only touch to the table   Knee/Hip Exercises: Supine   Straight Leg Raises AROM;Strengthening;Right;15 reps;Other (comment);1 set   Other Supine Knee Exercises butterfly stretch  2 x 30 sec   Knee/Hip Exercises: Sidelying   Hip ABduction AROM;Strengthening;Right;10 reps;Other (comment)  AAROM with manual assist and eccentric lowering   Hip ABduction Limitations --  increased fatigue and weakness   Other Sidelying Knee Exercises lateral band walks 5 x 10 ft   in // with yellow theraband.    Manual Therapy   Manual Therapy Joint mobilization;Massage;Myofascial release    Joint Mobilization R hip post/Ant oscillations grade 2-3 x 15 reps, R hip Distraction grade 2 osscilations x 15 reps   Massage R quadricep/ rectus femoris  with trigger point release on mid rectus femoris/ sartorious   Myofascial Release R quadricep/ rectus femoris                PT Education - 01/16/15 1435    Education Details HEP   Person(s) Educated Patient   Methods Explanation          PT Short Term Goals - 01/04/15 1104    PT SHORT TERM GOAL #1   Title to be independet with basic HEP   Time 4   Period Weeks   Status Achieved   PT SHORT TERM GOAL #2   Title to decrease pain to < 3/10 during hip flexion activities to promote funtional progression   Time 4   Period Weeks   Status On-going   PT SHORT TERM GOAL #3   Title to increase R hip Strength to > 3+/5 to assist with funcitonal progression   Time 4   Period Weeks   Status On-going           PT Long Term Goals - 01/04/15 1105    PT LONG TERM GOAL #1   Title To be independent with advanced HEP   Time 8   Period Weeks   Status On-going   PT LONG TERM GOAL #2   Title to decreae pain to < 2/10 to help with ADLs    Time 8   Period Weeks   Status On-going   PT LONG TERM GOAL #3   Title To increase R hip strength to >4/5 without pain to help with donning and doffing clothing   Time 8   Status On-going   PT LONG TERM GOAL #4   Title To verbalize and demonstrate techniques to prevent reinjury to R hip including stretching, strengthening and lifting mechanics   Time 8   Period Weeks   Status On-going               Plan - 01/16/15 1436    Clinical Impression Statement Steven Munoz continues to make progress with decreased R groin pain. palpation revealed tightness at the R mid rectus femoris/sartorious, PT  performed STM/trigger point release  and he reported decreased pain during SLR  exercises.    PT Next Visit Plan mobilization of R hip, hip abductor strengthening   PT Home Exercise Plan hip  hikes, and sidelying abduction,  assess short term goals.    Consulted and Agree with Plan of Care Patient        Problem List Patient Active Problem List   Diagnosis Date Noted  . Obesity (BMI 30-39.9) 06/02/2014  . CTS (carpal tunnel syndrome) 06/01/2014  . Varicose veins 04/07/2014  . Prolonged P-R interval 03/04/2013  . NEPHROLITHIASIS, HX OF 05/18/2010  . ATRIAL FIBRILLATION 06/20/2009  . CORONARY ARTERY BYPASS GRAFT, HX OF 06/20/2009  . PREMATURE VENTRICULAR CONTRACTIONS 05/09/2009  . DYSPNEA 05/09/2009  . MURMUR 05/08/2009  . Essential hypertension 04/10/2009  . DIVERTICULOSIS, COLON 04/10/2009  . G E R D 08/05/2008  . OBSTRUCTIVE SLEEP APNEA 08/05/2008  . DERMATOPHYTOSIS OF NAIL 07/29/2008  . Nocturia 05/30/2008  . HYPOTHYROIDISM 02/15/2008  . Elevated lipids 02/15/2008  . GOUT 02/15/2008  . DYSMETABOLIC SYNDROME X 62/56/3893  . COLONIC POLYPS, HX OF 01/14/2008   [name] PT, DPT, LAT, ATC  01/16/2015  2:44 PM   [facility] [address] [place], [place], [address] Phone: [phone]   Fax:  [phone]

## 2015-01-16 NOTE — Patient Instructions (Signed)
Abduction   Lift leg up toward ceiling. Return. Repeat 10 times x 2 sets . Do 1 sessions per day.  http://gt2.exer.us/385   Copyright  VHI. All rights reserved.   Hip Abduction: Step-Down (Eccentric)   Stand, affected leg on step. Raise other foot, keeping leg straight, hiking hip. Slowly lower heel toward floor for 3-5 seconds. 10 reps per set, 2 sets per day, 5 days per week.  Copyright  VHI. All rights reserved.

## 2015-01-17 ENCOUNTER — Encounter: Payer: Self-pay | Admitting: Pulmonary Disease

## 2015-01-17 ENCOUNTER — Ambulatory Visit (INDEPENDENT_AMBULATORY_CARE_PROVIDER_SITE_OTHER): Payer: Medicare HMO | Admitting: Pulmonary Disease

## 2015-01-17 VITALS — BP 154/88 | HR 58 | Ht 72.75 in | Wt 302.2 lb

## 2015-01-17 DIAGNOSIS — G4730 Sleep apnea, unspecified: Secondary | ICD-10-CM

## 2015-01-17 NOTE — Assessment & Plan Note (Signed)
CPAP download We will then write Rx for new CPAP  Weight loss encouraged, compliance with goal of at least 4-6 hrs every night is the expectation. Advised against medications with sedative side effects Cautioned against driving when sleepy - understanding that sleepiness will vary on a day to day basis

## 2015-01-17 NOTE — Progress Notes (Signed)
   Subjective:    Patient ID: Steven Munoz, male    DOB: [birth year], 74 y.o.   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  PSG (289 Lbs) 2009 -showed severe OSA with predominant hypopneas, AHI 31/h corrected by CPAP +8 with large quattro mask   PLms seemed to improve with titration  01/17/2015  Chief Complaint  Patient presents with  . Follow-up    Wears CPAP every night, last download received in our office 09/2013; Patient has not taken machine to [facility] for download since 2014.  Needs download.  Patients machine is old and he would like a new machine with wireless download capability. Pump is making noises.    Download 09/27/13 - on 8 cm - good usage Few residual events , leak   Gained 15 lbs He wonders if he can get a new machine He does not have problems while driving. He drinks only decaffeinated drinks  Bedtime is 11:30 PM, sleep latency is 15-30 minutes. He awakens 1-2 times mostly for bathroom visits without any postvoid sleep latency. He gets out of bed at 6:30 AM feeling rested without dryness of mouth or morning headaches.  No snoring, w. apneas, feels refreshed. Mask ok, pressure ok. Reports good compliance with CPAP     Review of Systems neg for any significant sore throat, dysphagia, itching, sneezing, nasal congestion or excess/ purulent secretions, fever, chills, sweats, unintended wt loss, pleuritic or exertional cp, hempoptysis, orthopnea pnd or change in chronic leg swelling. Also denies presyncope, palpitations, heartburn, abdominal pain, nausea, vomiting, diarrhea or change in bowel or urinary habits, dysuria,hematuria, rash, arthralgias, visual complaints, headache, numbness weakness or ataxia.     Objective:   Physical Exam  Gen. Pleasant, obese, in no distress ENT - no lesions, no post nasal drip Neck: No JVD, no thyromegaly, no carotid bruits Lungs: no use of accessory muscles, no  dullness to percussion, decreased without rales or rhonchi  Cardiovascular: Rhythm regular, heart sounds  normal, no murmurs or gallops, no peripheral edema Musculoskeletal: No deformities, no cyanosis or clubbing , no tremors        Assessment & Plan:

## 2015-01-17 NOTE — Patient Instructions (Signed)
CPAP download We will then write Rx for new CPAP

## 2015-01-18 ENCOUNTER — Ambulatory Visit: Payer: Medicare HMO | Admitting: Physical Therapy

## 2015-01-18 DIAGNOSIS — M25551 Pain in right hip: Secondary | ICD-10-CM | POA: Diagnosis not present

## 2015-01-18 DIAGNOSIS — R29898 Other symptoms and signs involving the musculoskeletal system: Secondary | ICD-10-CM

## 2015-01-18 DIAGNOSIS — M25669 Stiffness of unspecified knee, not elsewhere classified: Secondary | ICD-10-CM

## 2015-01-18 NOTE — Therapy (Signed)
[place], [place], [address] Phone: [phone]   Fax:  [phone]  Physical Therapy Treatment  Patient Details  Name: Steven Munoz MRN: [id number] Date of Birth: 1941-08-21 Referring Provider:  Hendricks Limes, MD  Encounter Date: 01/18/2015      PT End of Session - 01/18/15 1108    Visit Number 8   Number of Visits 16   Date for PT Re-Evaluation 02/19/15   PT Start Time 1027   PT Stop Time 1100   PT Time Calculation (min) 45 min   Activity Tolerance Patient tolerated treatment well   Behavior During Therapy [facility] for tasks assessed/performed      Past Medical History  Diagnosis Date  . Murmur   . Hypothyroidism   . Hyperlipidemia   . Hypertension   . Diverticulosis   . GERD (gastroesophageal reflux disease)     S/P dilation  2005  . Sleep apnea     on CPAP; Dr [name]  . Dermatophytosis of nail   . Dysmetabolic syndrome   . Gout   . Colonic polyp 2002, 2005, 2009    [name]  . 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 Nephrolithiasis     X 1  . Pneumonia age 34    treated as inpatient    Past Surgical History  Procedure Laterality Date  . Coronary artery bypass graft  2010    Median sternotomy, extracorporeal circulation, coronary bypass graft surgery x3 using a left internal mammary artery graft to left anterior descending coronary artery, with a sequential  saphenous vein graft to posterior descending and posterolateral branches  of the right coronary artery.  Endoscopic vein harvesting from the right  . Athroscopy      right knee X2, left knee X1  . Colonoscopy  2002 & 2009 &2014    polypectomy; [name]  . Replacement total knee      right 2003  . Replacement total knee      partial Left March 2009  . Nose surgery  11/20/12    L max antrectomy;septoplasty;turbinate reduction. [name]  . Nail alvusion Bilateral     Hallux  . 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  Visit Diagnosis:  Decreased ROM of lower extremity  Weakness of right hip  Right hip pain      Subjective Assessment - 01/18/15 1020    Symptoms He reports no changes in his symptoms since the last visit.    Currently in Pain? Yes   Pain Score 4    Pain Location Groin   Pain Orientation Right;Medial   Pain Descriptors / Indicators Aching   Pain Type Chronic pain   Pain Onset More than a month ago   Pain Frequency Intermittent                       [facility] Adult PT Treatment/Exercise - 01/18/15 1021    Knee/Hip Exercises: Stretches   Passive Hamstring Stretch 2 reps;30 seconds   Hip Flexor Stretch 4 reps;30 seconds  PNF contract relax technique   Knee/Hip Exercises: Aerobic   Elliptical level 1 x 8 minutes   L 5 x 6 min   Knee/Hip Exercises: Machines for Strengthening   Cybex Leg Press 1 set  60# x 15 reps, 1 set 100# x 15 reps   Knee/Hip Exercises: Standing   Lateral Step Up 2 sets;10 reps;Step Height: 4"   Other Standing Knee Exercises steam boats with R abd and extx 20 ea  with green theraband.   Other Standing Knee Exercises sit to stand  2 x 10   Knee/Hip Exercises: Supine   Straight Leg Raises AROM;Strengthening;Right;15 reps;Other (comment);1 set   Other Supine Knee Exercises hip flexion with feet on physioball and green theraband around R foot for resistance  2 x 10    Knee/Hip Exercises: Sidelying   Hip ABduction AROM;Strengthening;Right;10 reps;Other (comment)   Hip ABduction Limitations getting strong but has increased weakness pas 10 inches high   Other Sidelying Knee Exercises lateral band walks 5 x 10 ft   with green theraband   Manual Therapy   Manual Therapy Joint mobilization   Joint Mobilization R hip post/Ant oscillations grade 2-3 x 15 reps, R hip Distraction grade 2  osscilations x 15 reps  long axis distraction with intermittent SLR x 10                PT Education - 01/18/15 1108    Education provided Yes   Education Details continue with abduction strengthening avoiding compensation   Person(s) Educated Patient   Methods Explanation   Comprehension Verbalized understanding          PT Short Term Goals - 01/04/15 1104    PT SHORT TERM GOAL #1   Title to be independet with basic HEP   Time 4   Period Weeks   Status Achieved   PT SHORT TERM GOAL #2   Title to decrease pain to < 3/10 during hip flexion activities to promote funtional progression   Time 4   Period Weeks   Status On-going   PT SHORT TERM GOAL #3   Title to increase R hip Strength to > 3+/5 to assist with funcitonal progression   Time 4   Period Weeks   Status On-going           PT Long Term Goals - 01/04/15 1105    PT LONG TERM GOAL #1   Title To be independent with advanced HEP   Time 8   Period Weeks   Status On-going   PT LONG TERM GOAL #2   Title to decreae pain to < 2/10 to help with ADLs    Time 8   Period Weeks   Status On-going   PT LONG TERM GOAL #3   Title To increase R hip strength to >4/5 without pain to help with donning and doffing clothing   Time 8   Status On-going   PT LONG TERM GOAL #4   Title To verbalize and demonstrate techniques to prevent reinjury to R hip including stretching, strengthening and lifting mechanics   Time 8   Period Weeks   Status On-going               Plan - 01/18/15 1102    Clinical Impression Statement Armel continues to report pain in the R anterior hip thats intermitten depending on the activity he is doing. Palpation reveals pain to be pin pointed at he proximal recuts femoris tendon.  He continues to demonstrate weakness of the hip abductors and attempts to compensate during abduction exercies by externally rotating his leg. pt stated he can tell that his R LLE is weak in comparsion to his L.    PT Next  Visit Plan mobilization of R hip, hip abductor strengthening,    PT Home Exercise Plan same as previous visit   Consulted and Agree with Plan of Care Patient        Problem List Patient Active Problem List   Diagnosis Date Noted  . Obesity (BMI 30-39.9) 06/02/2014  . CTS (carpal tunnel syndrome) 06/01/2014  . Varicose veins 04/07/2014  . Prolonged P-R interval 03/04/2013  . NEPHROLITHIASIS, HX OF 05/18/2010  . ATRIAL FIBRILLATION 06/20/2009  . CORONARY ARTERY BYPASS GRAFT, HX OF 06/20/2009  . PREMATURE VENTRICULAR CONTRACTIONS 05/09/2009  . DYSPNEA 05/09/2009  . MURMUR 05/08/2009  . Essential hypertension 04/10/2009  . DIVERTICULOSIS, COLON 04/10/2009  . G E R D 08/05/2008  . Sleep apnea 08/05/2008  . DERMATOPHYTOSIS OF NAIL 07/29/2008  . Nocturia 05/30/2008  . HYPOTHYROIDISM 02/15/2008  . Elevated lipids 02/15/2008  . GOUT 02/15/2008  . DYSMETABOLIC SYNDROME X 82/50/0370  . COLONIC POLYPS, HX OF 01/14/2008   [name] PT, DPT, LAT, ATC  01/18/2015  11:11 AM   [place] [facility] [address] [place], [place], [address] Phone: [phone]   Fax:  [phone]

## 2015-01-23 ENCOUNTER — Ambulatory Visit: Payer: Medicare HMO | Admitting: Physical Therapy

## 2015-01-23 DIAGNOSIS — M25551 Pain in right hip: Secondary | ICD-10-CM | POA: Diagnosis not present

## 2015-01-23 DIAGNOSIS — R29898 Other symptoms and signs involving the musculoskeletal system: Secondary | ICD-10-CM

## 2015-01-23 NOTE — Therapy (Signed)
[place], [place], [address] Phone: [phone]   Fax:  [phone]  Physical Therapy Treatment  Patient Details  Name: Steven Munoz MRN: [id number] Date of Birth: 12/18/1940 Referring Provider:  Hendricks Limes, MD  Encounter Date: 01/23/2015      PT End of Session - 01/23/15 1526    Visit Number 9   Number of Visits 16   Date for PT Re-Evaluation 02/19/15   PT Start Time 5361   PT Stop Time 1510   PT Time Calculation (min) 55 min   Activity Tolerance Patient tolerated treatment well      Past Medical History  Diagnosis Date  . Murmur   . Hypothyroidism   . Hyperlipidemia   . Hypertension   . Diverticulosis   . GERD (gastroesophageal reflux disease)     S/P dilation  2005  . Sleep apnea     on CPAP; Dr [name]  . Dermatophytosis of nail   . Dysmetabolic syndrome   . Gout   . Colonic polyp 2002, 2005, 2009    [name]  . 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 Nephrolithiasis     X 1  . Pneumonia age 74    treated as inpatient    Past Surgical History  Procedure Laterality Date  . Coronary artery bypass graft  2010    Median sternotomy, extracorporeal circulation, coronary bypass graft surgery x3 using a left internal mammary artery graft to left anterior descending coronary artery, with a sequential  saphenous vein graft to posterior descending and posterolateral branches  of the right coronary artery.  Endoscopic vein harvesting from the right  . Athroscopy      right knee X2, left knee X1  . Colonoscopy  2002 & 2009 &2014    polypectomy; [name]  . Replacement total knee      right 2003  . Replacement total knee      partial Left March 2009  . Nose surgery  11/20/12    L max antrectomy;septoplasty;turbinate reduction. [name]  . Nail alvusion Bilateral     Hallux  . Total hip  arthroplasty  02/22/2014    right hip; Dr [facility]    There were no vitals filed for this visit.  Visit Diagnosis:  Weakness of right hip      Subjective Assessment - 01/23/15 1432    Symptoms groin area pain, charlie horse thigh anterior.  Doing his exercises.  Ice pick with steps sometimes.   Pain Score 0-No pain  up to 8/10  not every day , but regular.  Rough in am.                       [facility] Adult PT Treatment/Exercise - 01/23/15 1432    Ambulation/Gait   Gait Comments noted decreased rotation, Rt hip depression,, anterior/posterior movements.  Able to work toward correcting these with tactile and verbal cues.   Knee/Hip Exercises: Aerobic   Stationary Bike Level    Knee/Hip Exercises: Standing   Other Standing Knee Exercises isometric hip abduction 10 reps 10 seconds.   Other Standing Knee Exercises isometric hip flexion 10 seconds 10 reps able to do with no pain   Knee/Hip Exercises: Supine   Hip Adduction Isometric 10 reps  10 seconds.  Isometric with belt resistance   Other Supine Knee Exercises Leg  lengthener, very small motion initially then as patient worked motion was WNL.                PT Education - 01/23/15 1526    Education provided Yes   Education Details gait training          PT Short Term Goals - 01/23/15 1531    PT SHORT TERM GOAL #1   Title to be independet with basic HEP   Time 4   Period Weeks   Status Achieved   PT SHORT TERM GOAL #2   Title to decrease pain to < 3/10 during hip flexion activities to promote funtional progression   Baseline up to 8/10   Time 4   Period Weeks   Status On-going   PT SHORT TERM GOAL #3   Title to increase R hip Strength to > 3+/5 to assist with funcitonal progression   Time 4   Period Weeks   Status Unable to assess           PT Long Term Goals - 01/23/15 1531    PT LONG TERM GOAL #1   Title To be independent with advanced HEP   Baseline adherent so far.   Time 8    Period Weeks   Status On-going   PT LONG TERM GOAL #2   Title to decreae pain to < 2/10 to help with ADLs    Time 8   Period Weeks   Status On-going   PT LONG TERM GOAL #3   Title To increase R hip strength to >4/5 without pain to help with donning and doffing clothing   Time 8   Period Weeks   Status On-going   PT LONG TERM GOAL #4   Title To verbalize and demonstrate techniques to prevent reinjury to R hip including stretching, strengthening and lifting mechanics   Time 8   Period Weeks   Status Unable to assess               Plan - 01/23/15 1527    Clinical Impression Statement Gait :  holds Rt hip high, able to correct with cues also worked on other components of gait.  Strengthening more isometric today to decrease pain and decrease opportunity for compensation.   PT Next Visit Plan mobilization of R hip, hip abductor strengthening, Gait check.  Will need MD note.        Problem List Patient Active Problem List   Diagnosis Date Noted  . Obesity (BMI 30-39.9) 06/02/2014  . CTS (carpal tunnel syndrome) 06/01/2014  . Varicose veins 04/07/2014  . Prolonged P-R interval 03/04/2013  . NEPHROLITHIASIS, HX OF 05/18/2010  . ATRIAL FIBRILLATION 06/20/2009  . CORONARY ARTERY BYPASS GRAFT, HX OF 06/20/2009  . PREMATURE VENTRICULAR CONTRACTIONS 05/09/2009  . DYSPNEA 05/09/2009  . MURMUR 05/08/2009  . Essential hypertension 04/10/2009  . DIVERTICULOSIS, COLON 04/10/2009  . G E R D 08/05/2008  . Sleep apnea 08/05/2008  . DERMATOPHYTOSIS OF NAIL 07/29/2008  . Nocturia 05/30/2008  . HYPOTHYROIDISM 02/15/2008  . Elevated lipids 02/15/2008  . GOUT 02/15/2008  . DYSMETABOLIC SYNDROME X 02/54/2706  . COLONIC POLYPS, HX OF 01/14/2008  Melvenia Needles, PTA 01/23/2015 3:34 PM Phone: [phone] Fax: [phone]   [facility] 01/23/2015, 3:34 PM  [facility] [address] [place], [place], [address] Phone: [phone]    Fax:  [phone]

## 2015-01-24 ENCOUNTER — Telehealth: Payer: Self-pay | Admitting: Pulmonary Disease

## 2015-01-24 DIAGNOSIS — G4730 Sleep apnea, unspecified: Secondary | ICD-10-CM

## 2015-01-24 NOTE — Telephone Encounter (Signed)
63m download 12/2014 >> excellent usage on 8 cm, no residual events rx sent for new CPAP

## 2015-01-25 NOTE — Telephone Encounter (Signed)
Spoke with pt and notified of results per Dr. Alva. Pt verbalized understanding and denied any questions. 

## 2015-01-26 ENCOUNTER — Ambulatory Visit: Payer: Medicare HMO | Admitting: Physical Therapy

## 2015-01-26 DIAGNOSIS — M25551 Pain in right hip: Secondary | ICD-10-CM

## 2015-01-26 DIAGNOSIS — M25669 Stiffness of unspecified knee, not elsewhere classified: Secondary | ICD-10-CM

## 2015-01-26 DIAGNOSIS — R29898 Other symptoms and signs involving the musculoskeletal system: Secondary | ICD-10-CM

## 2015-01-26 NOTE — Therapy (Addendum)
[place], [place], [address] Phone: [phone]   Fax:  [phone]  Physical Therapy Treatment / progress note  Patient Details  Name: Steven Munoz MRN: [id number] Date of Birth: July 09, 1941 Referring Provider:  Hendricks Limes, MD  Encounter Date: 01/26/2015      PT End of Session - 01/26/15 1742    Visit Number 10   Number of Visits 16   Date for PT Re-Evaluation 02/19/15   PT Start Time 9179   PT Stop Time 1510   PT Time Calculation (min) 55 min   Activity Tolerance Patient tolerated treatment well;Patient limited by pain   Behavior During Therapy [facility] for tasks assessed/performed      Past Medical History  Diagnosis Date  . Murmur   . Hypothyroidism   . Hyperlipidemia   . Hypertension   . Diverticulosis   . GERD (gastroesophageal reflux disease)     S/P dilation  2005  . Sleep apnea     on CPAP; Dr [name]  . Dermatophytosis of nail   . Dysmetabolic syndrome   . Gout   . Colonic polyp 2002, 2005, 2009    [name]  . 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 Nephrolithiasis     X 1  . Pneumonia age 74    treated as inpatient    Past Surgical History  Procedure Laterality Date  . Coronary artery bypass graft  2010    Median sternotomy, extracorporeal circulation, coronary bypass graft surgery x3 using a left internal mammary artery graft to left anterior descending coronary artery, with a sequential  saphenous vein graft to posterior descending and posterolateral branches  of the right coronary artery.  Endoscopic vein harvesting from the right  . Athroscopy      right knee X2, left knee X1  . Colonoscopy  2002 & 2009 &2014    polypectomy; [name]  . Replacement total knee      right 2003  . Replacement total knee      partial Left March 2009  . Nose surgery  11/20/12    L max  antrectomy;septoplasty;turbinate reduction. [name]  . Nail alvusion Bilateral     Hallux  . 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  Visit Diagnosis:  No diagnosis found.      Subjective Assessment - 01/26/15 1422    Symptoms continues to report pain in the R groin/hip with referral to mid thigh.    Currently in Pain? Yes   Pain Score 0-No pain  walking or hiking hp 6/10   Pain Location Groin   Pain Orientation Right;Medial   Pain Descriptors / Indicators Sore   Pain Onset More than a month ago   Pain Frequency Intermittent   Aggravating Factors  hip flexion, getting out of car,    Pain Relieving Factors moving around and stretching   Multiple Pain Sites No            [facility] PT Assessment - 01/26/15 0001    Observation/Other Assessments   Lower Extremity Functional Scale  45/80    AROM   Right Hip Flexion 35   Right Hip External Rotation  24   Right Hip Internal Rotation  45   Right Hip ABduction 70   Strength  Strength Assessment Site Hip   Right Hip Flexion 2+/5   Right Hip Extension 5/5  due to pain    Right Hip ABduction 5/5   Right Hip ADduction 5/5   Left Hip Flexion 5/5   Left Hip Extension 5/5   Left Hip ABduction 5/5   Left Hip ADduction 5/5   Palpation   Palpation tenderness at the p                   [facility] Adult PT Treatment/Exercise - 01/26/15 0001    Knee/Hip Exercises: Stretches   Passive Hamstring Stretch 2 reps;30 seconds   Hip Flexor Stretch 4 reps;30 seconds   Knee/Hip Exercises: Aerobic   Stationary Bike Nu Step L5 x 8 min   Knee/Hip Exercises: Supine   Bridges Both;Strengthening;AROM;1 set;15 reps;Other (comment)  with glute squeeze   Manual Therapy   Manual Therapy Joint mobilization   Joint Mobilization R hip post/Ant oscillations grade 2-3 x 15 reps, R hip Distraction grade 2 osscilations x 15 reps   Massage R quadricep/ rectus  femoris   Myofascial Release R quadricep/ rectus femoris                PT Education - 01/26/15 1742    Education provided Yes   Education Details Stretching of hip flexors, gait training   Person(s) Educated Patient   Methods Explanation   Comprehension Verbalized understanding          PT Short Term Goals - 01/26/15 1454    PT SHORT TERM GOAL #1   Title to be independet with basic HEP   Time 4   Period Weeks   Status Achieved   PT SHORT TERM GOAL #2   Title to decrease pain to < 3/10 during hip flexion activities to promote funtional progression   Baseline up to 8/10   Time 4   Period Weeks   Status On-going   PT SHORT TERM GOAL #3   Title to increase R hip Strength to > 3+/5 to assist with funcitonal progression   Time 4   Period Weeks   Status On-going           PT Long Term Goals - 01/23/15 1531    PT LONG TERM GOAL #1   Title To be independent with advanced HEP   Baseline adherent so far.   Time 8   Period Weeks   Status On-going   PT LONG TERM GOAL #2   Title to decreae pain to < 2/10 to help with ADLs    Time 8   Period Weeks   Status On-going   PT LONG TERM GOAL #3   Title To increase R hip strength to >4/5 without pain to help with donning and doffing clothing   Time 8   Period Weeks   Status On-going   PT LONG TERM GOAL #4   Title To verbalize and demonstrate techniques to prevent reinjury to R hip including stretching, strengthening and lifting mechanics   Time 8   Period Weeks   Status Unable to assess      G-code: Mobility: Walking and moving around Goals Status: CI Discharge status CJ         Plan - 01/26/15 1743    Clinical Impression Statement Steven Munoz continues to demonstrate increased pain in the R hip flexors with tenerness noted at the proxmial rectus femoris tendon. He has pain during Straightleg raise and seated hip flexion with weakness  in the hip  flexors of 2-/5 due to pain. pain/ weakness seems to be  contributed from possible impingment of the hip flexor musculature due to cyriax findings of active/ passive morvement.    Rehab Potential Good   PT Next Visit Plan mobilization of R hip, hip abductor strengthening, Gait check.  Will need MD note.   PT Home Exercise Plan same as previous visit   Consulted and Agree with Plan of Care Patient          G-Codes - 02-22-15 1750    Functional Assessment Tool Used clinical judgement and LEFS    Mobility: Walking and Moving Around Current Status (E0712) At least 40 percent but less than 60 percent impaired, limited or restricted   Mobility: Walking and Moving Around Goal Status (R9758) At least 1 percent but less than 20 percent impaired, limited or restricted      Problem List Patient Active Problem List   Diagnosis Date Noted  . Obesity (BMI 30-39.9) 06/02/2014  . CTS (carpal tunnel syndrome) 06/01/2014  . Varicose veins 04/07/2014  . Prolonged P-R interval 03/04/2013  . NEPHROLITHIASIS, HX OF 05/18/2010  . ATRIAL FIBRILLATION 06/20/2009  . CORONARY ARTERY BYPASS GRAFT, HX OF 06/20/2009  . PREMATURE VENTRICULAR CONTRACTIONS 05/09/2009  . DYSPNEA 05/09/2009  . MURMUR 05/08/2009  . Essential hypertension 04/10/2009  . DIVERTICULOSIS, COLON 04/10/2009  . G E R D 08/05/2008  . Sleep apnea 08/05/2008  . DERMATOPHYTOSIS OF NAIL 07/29/2008  . Nocturia 05/30/2008  . HYPOTHYROIDISM 02/15/2008  . Elevated lipids 02/15/2008  . GOUT 02/15/2008  . DYSMETABOLIC SYNDROME X 83/25/4982  . COLONIC POLYPS, HX OF 01/14/2008   [name] PT, DPT, LAT, ATC  02/22/15  5:53 PM   [facility] [address] [place], [place], [address] Phone: [phone]   Fax:  [phone]       PHYSICAL THERAPY DISCHARGE SUMMARY  Visits from Start of Care: 10  Current functional level related to goals / functional outcomes: Clinical Judgement 30% limitation   Remaining deficits: See goals    Education / Equipment: HEP  Plan:                                                    Patient goals were partially met. Patient is being discharged due to not returning since the last visit.  ?????

## 2015-01-30 ENCOUNTER — Ambulatory Visit: Payer: Medicare HMO | Admitting: Physical Therapy

## 2015-01-30 ENCOUNTER — Encounter: Payer: Self-pay | Admitting: Internal Medicine

## 2015-01-30 ENCOUNTER — Ambulatory Visit (INDEPENDENT_AMBULATORY_CARE_PROVIDER_SITE_OTHER): Payer: Medicare HMO | Admitting: Internal Medicine

## 2015-01-30 VITALS — BP 140/88 | HR 60 | Temp 98.2°F | Ht 73.0 in | Wt 295.5 lb

## 2015-01-30 DIAGNOSIS — J069 Acute upper respiratory infection, unspecified: Secondary | ICD-10-CM

## 2015-01-30 DIAGNOSIS — J209 Acute bronchitis, unspecified: Secondary | ICD-10-CM | POA: Diagnosis not present

## 2015-01-30 MED ORDER — AMOXICILLIN 500 MG PO CAPS
500.0000 mg | ORAL_CAPSULE | Freq: Three times a day (TID) | ORAL | Status: DC
Start: 2015-01-30 — End: 2015-06-06

## 2015-01-30 MED ORDER — HYDROCODONE-HOMATROPINE 5-1.5 MG/5ML PO SYRP: 5.0000 mL | ORAL_SOLUTION | Freq: Four times a day (QID) | ORAL | Status: DC | PRN

## 2015-01-30 NOTE — Progress Notes (Signed)
   Subjective:    Patient ID: Steven Munoz, male    DOB: [birth year], 74 y.o.   MRN: [id number]  HPI Symptoms began 01/20/15 as a sore throat. Symptoms persisted and as of 3/29 he had head congestion and cough. Initially the cough was productive of yellow sputum. As of 4/3 the cough is dry. He continues to have yellow nasal discharge. He has headache in the temple areas.  He has been using Afrin in place of Flonase.   Review of Systems He denies frontal sinus area headache or facial pain per se. He has no associated fever, chills, or sweats. There is no shortness of breath or wheezing with the cough. There is no associated dental pain. He has no loss of smell or taste.     Objective:   Physical Exam  General appearance:Adequately nourished; no acute distress or increased work of breathing is present.   BMI:38.99  Lymphatic: No  lymphadenopathy about the head, neck, or axilla .  Eyes: No conjunctival inflammation or lid edema is present. There is no scleral icterus.  Ears:  External ear exam shows no significant lesions or deformities.  Otoscopic examination reveals clear canals, tympanic membranes are intact bilaterally without bulging, retraction, inflammation or discharge.  Nose:  External nasal examination shows no deformity or inflammation. Nasal mucosa are erythematous ,especially R septum, without lesions or exudates No septal dislocation or deviation.No obstruction to airflow.   Oral exam: Dental hygiene is good; lips and gums are healthy appearing.There is no oropharyngeal erythema or exudate .  Neck:  No deformities, thyromegaly, masses, or tenderness noted.   Supple with full range of motion without pain.   Heart:  Distant heart sounds; slightly irregular rhythm. Grade 1/6 systolic murmur.No gallop, click, rub or other extra sounds.   Lungs:Chest clear to auscultation; no wheezes, rhonchi,rales ,or rubs present.  Extremities:  No cyanosis, edema, or clubbing  noted     Skin: Warm & dry w/o tenting or jaundice. No significant lesions or rash.       Assessment & Plan:  #1 upper respiratory infection;R/O sinusitis # 2 bronchitis , acute w/o bronchospasm Plan: See orders and recommendations

## 2015-01-30 NOTE — Progress Notes (Signed)
Pre visit review using our clinic review tool, if applicable. No additional management support is needed unless otherwise documented below in the visit note. 

## 2015-01-30 NOTE — Patient Instructions (Signed)

## 2015-02-01 ENCOUNTER — Encounter: Payer: Medicare HMO | Admitting: Physical Therapy

## 2015-02-06 ENCOUNTER — Encounter: Payer: Medicare HMO | Admitting: Physical Therapy

## 2015-02-08 ENCOUNTER — Telehealth: Payer: Self-pay

## 2015-02-08 ENCOUNTER — Encounter: Payer: Medicare HMO | Admitting: Physical Therapy

## 2015-02-08 NOTE — Telephone Encounter (Signed)
Patient called to educate on Medicare Wellness apt. LVM for the patient to call back to educate and schedule for wellness visit.   

## 2015-02-10 ENCOUNTER — Telehealth: Payer: Self-pay

## 2015-02-10 NOTE — Telephone Encounter (Signed)
Called the patient to let him this medication has a black box warning and therefore would not be prudent to order for his intended use. Medication can affect mental alertness and balance when operating machinery.Medication will remain dicontinued.

## 2015-02-10 NOTE — Telephone Encounter (Signed)
Patient returned call regarding AMW and requested refill on Transderm scopolamine patch 1.5 mg q 3 days. States he used this for sailing and would like to use for hobby; race care driving in a controlled setting.  Medication is now discontinued.

## 2015-02-10 NOTE — Telephone Encounter (Signed)
Call back from patient and scheduled AMW for July and scheduled CPE with Dr. Linna Darner in August.   Expresses "need" to lose weight; Coaching provided and is considering weight watchers and could buddy with daughter; Family encouraging;  Discussed exercise; toning; Explored options and reviewed food triggers as well as discovery of possible plan. Success noted in the past. Inspiration to lose; clothes fit; overall health; grand-children   Declined dietician at this time.

## 2015-02-10 NOTE — Telephone Encounter (Signed)
This would not be recommended to be used when driving .This medication is among those which experts (Dr Elliot Cousin' group) have documented to have a very  high risk of affecting  mental  alertness  & balance especially when operating vehicle or machinery

## 2015-02-13 ENCOUNTER — Encounter: Payer: Medicare HMO | Admitting: Physical Therapy

## 2015-02-15 ENCOUNTER — Encounter: Payer: Medicare HMO | Admitting: Physical Therapy

## 2015-02-20 ENCOUNTER — Encounter: Payer: Medicare HMO | Admitting: Physical Therapy

## 2015-02-22 ENCOUNTER — Encounter: Payer: Medicare HMO | Admitting: Physical Therapy

## 2015-02-24 ENCOUNTER — Other Ambulatory Visit: Payer: Self-pay

## 2015-02-24 MED ORDER — METOPROLOL TARTRATE 25 MG PO TABS: 12.5000 mg | ORAL_TABLET | Freq: Every day | ORAL | Status: DC

## 2015-02-24 NOTE — Telephone Encounter (Signed)
Not on current medication list.   

## 2015-02-24 NOTE — Telephone Encounter (Signed)
He wanted this so he could race a car on an oval track; that's medically contraindicated

## 2015-04-24 ENCOUNTER — Other Ambulatory Visit: Payer: Self-pay

## 2015-04-27 ENCOUNTER — Other Ambulatory Visit: Payer: Self-pay | Admitting: Emergency Medicine

## 2015-04-27 DIAGNOSIS — I10 Essential (primary) hypertension: Secondary | ICD-10-CM

## 2015-04-27 MED ORDER — HYDROCHLOROTHIAZIDE 12.5 MG PO CAPS: 12.5000 mg | ORAL_CAPSULE | Freq: Every day | ORAL | Status: DC

## 2015-05-02 ENCOUNTER — Other Ambulatory Visit: Payer: Self-pay | Admitting: Emergency Medicine

## 2015-05-02 MED ORDER — OMEPRAZOLE 20 MG PO CPDR: 20.0000 mg | DELAYED_RELEASE_CAPSULE | Freq: Every day | ORAL | Status: DC

## 2015-05-09 ENCOUNTER — Ambulatory Visit (INDEPENDENT_AMBULATORY_CARE_PROVIDER_SITE_OTHER): Payer: Medicare HMO

## 2015-05-09 VITALS — BP 140/78 | HR 53 | Ht 73.0 in | Wt 294.8 lb

## 2015-05-09 DIAGNOSIS — Z23 Encounter for immunization: Secondary | ICD-10-CM | POA: Diagnosis not present

## 2015-05-09 DIAGNOSIS — Z0000 Encounter for general adult medical examination without abnormal findings: Secondary | ICD-10-CM

## 2015-05-09 NOTE — Patient Instructions (Addendum)
Steven Munoz , Thank you for taking time to come for your Medicare Wellness Visit. I appreciate your ongoing commitment to your health goals. Please review the following plan we discussed and let me know if I can assist you in the future.   Agrees to take prevnar;  Will start on diet plan to cut back on sugar and carbs    These are the goals we discussed: Goals    . Weight < 250 lb (113.399 kg)     Plan to loose weight;  Cut out carbs; no white / rice, pasta,        This is a list of the screening recommended for you and due dates:  Health Maintenance  Topic Date Due  . Shingles Vaccine  11/29/2000  . Pneumonia vaccines (2 of 2 - PCV13) 05/26/2012  . Flu Shot  05/29/2015  . Tetanus Vaccine  05/18/2020  . Colon Cancer Screening  07/29/2023     Health Maintenance A healthy lifestyle and preventative care can promote health and wellness.  Maintain regular health, dental, and eye exams.  Eat a healthy diet. Foods like vegetables, fruits, whole grains, low-fat dairy products, and lean protein foods contain the nutrients you need and are low in calories. Decrease your intake of foods high in solid fats, added sugars, and salt. Get information about a proper diet from your health care provider, if necessary.  Regular physical exercise is one of the most important things you can do for your health. Most adults should get at least 150 minutes of moderate-intensity exercise (any activity that increases your heart rate and causes you to sweat) each week. In addition, most adults need muscle-strengthening exercises on 2 or more days a week.   Maintain a healthy weight. The body mass index (BMI) is a screening tool to identify possible weight problems. It provides an estimate of body fat based on height and weight. Your health care provider can find your BMI and can help you achieve or maintain a healthy weight. For males 20 years and older:  A BMI below 18.5 is considered underweight.  A  BMI of 18.5 to 24.9 is normal.  A BMI of 25 to 29.9 is considered overweight.  A BMI of 30 and above is considered obese.  Maintain normal blood lipids and cholesterol by exercising and minimizing your intake of saturated fat. Eat a balanced diet with plenty of fruits and vegetables. Blood tests for lipids and cholesterol should begin at age 26 and be repeated every 5 years. If your lipid or cholesterol levels are high, you are over age 35, or you are at high risk for heart disease, you may need your cholesterol levels checked more frequently.Ongoing high lipid and cholesterol levels should be treated with medicines if diet and exercise are not working.  If you smoke, find out from your health care provider how to quit. If you do not use tobacco, do not start.  Lung cancer screening is recommended for adults aged 78-80 years who are at high risk for developing lung cancer because of a history of smoking. A yearly low-dose CT scan of the lungs is recommended for people who have at least a 30-pack-year history of smoking and are current smokers or have quit within the past 15 years. A pack year of smoking is smoking an average of 1 pack of cigarettes a day for 1 year (for example, a 30-pack-year history of smoking could mean smoking 1 pack a day for 30 years or  2 packs a day for 15 years). Yearly screening should continue until the smoker has stopped smoking for at least 15 years. Yearly screening should be stopped for people who develop a health problem that would prevent them from having lung cancer treatment.  If you choose to drink alcohol, do not have more than 2 drinks per day. One drink is considered to be 12 oz (360 mL) of beer, 5 oz (150 mL) of wine, or 1.5 oz (45 mL) of liquor.  Avoid the use of street drugs. Do not share needles with anyone. Ask for help if you need support or instructions about stopping the use of drugs.  High blood pressure causes heart disease and increases the risk of  stroke. Blood pressure should be checked at least every 1-2 years. Ongoing high blood pressure should be treated with medicines if weight loss and exercise are not effective.  If you are 36-36 years old, ask your health care provider if you should take aspirin to prevent heart disease.  Diabetes screening involves taking a blood sample to check your fasting blood sugar level. This should be done once every 3 years after age 16 if you are at a normal weight and without risk factors for diabetes. Testing should be considered at a younger age or be carried out more frequently if you are overweight and have at least 1 risk factor for diabetes.  Colorectal cancer can be detected and often prevented. Most routine colorectal cancer screening begins at the age of 35 and continues through age 68. However, your health care provider may recommend screening at an earlier age if you have risk factors for colon cancer. On a yearly basis, your health care provider may provide home test kits to check for hidden blood in the stool. A small camera at the end of a tube may be used to directly examine the colon (sigmoidoscopy or colonoscopy) to detect the earliest forms of colorectal cancer. Talk to your health care provider about this at age 16 when routine screening begins. A direct exam of the colon should be repeated every 5-10 years through age 20, unless early forms of precancerous polyps or small growths are found.  People who are at an increased risk for hepatitis B should be screened for this virus. You are considered at high risk for hepatitis B if:  You were born in a country where hepatitis B occurs often. Talk with your health care provider about which countries are considered high risk.  Your parents were born in a high-risk country and you have not received a shot to protect against hepatitis B (hepatitis B vaccine).  You have HIV or AIDS.  You use needles to inject street drugs.  You live with, or have  sex with, someone who has hepatitis B.  You are a man who has sex with other men (MSM).  You get hemodialysis treatment.  You take certain medicines for conditions like cancer, organ transplantation, and autoimmune conditions.  Hepatitis C blood testing is recommended for all people born from [birth year] through 1965 and any individual with known risk factors for hepatitis C.  Healthy men should no longer receive prostate-specific antigen (PSA) blood tests as part of routine cancer screening. Talk to your health care provider about prostate cancer screening.  Testicular cancer screening is not recommended for adolescents or adult males who have no symptoms. Screening includes self-exam, a health care provider exam, and other screening tests. Consult with your health care provider about any symptoms  you have or any concerns you have about testicular cancer.  Practice safe sex. Use condoms and avoid high-risk sexual practices to reduce the spread of sexually transmitted infections (STIs).  You should be screened for STIs, including gonorrhea and chlamydia if:  You are sexually active and are younger than 24 years.  You are older than 24 years, and your health care provider tells you that you are at risk for this type of infection.  Your sexual activity has changed since you were last screened, and you are at an increased risk for chlamydia or gonorrhea. Ask your health care provider if you are at risk.  If you are at risk of being infected with HIV, it is recommended that you take a prescription medicine daily to prevent HIV infection. This is called pre-exposure prophylaxis (PrEP). You are considered at risk if:  You are a man who has sex with other men (MSM).  You are a heterosexual man who is sexually active with multiple partners.  You take drugs by injection.  You are sexually active with a partner who has HIV.  Talk with your health care provider about whether you are at high risk of  being infected with HIV. If you choose to begin PrEP, you should first be tested for HIV. You should then be tested every 3 months for as long as you are taking PrEP.  Use sunscreen. Apply sunscreen liberally and repeatedly throughout the day. You should seek shade when your shadow is shorter than you. Protect yourself by wearing long sleeves, pants, a wide-brimmed hat, and sunglasses year round whenever you are outdoors.  Tell your health care provider of new moles or changes in moles, especially if there is a change in shape or color. Also, tell your health care provider if a mole is larger than the size of a pencil eraser.  A one-time screening for abdominal aortic aneurysm (AAA) and surgical repair of large AAAs by ultrasound is recommended for men aged 42-75 years who are current or former smokers.  Stay current with your vaccines (immunizations). Document Released: 04/11/2008 Document Revised: 10/19/2013 Document Reviewed: 03/11/2011 [facility] Patient Information 2015 [place], [place]. This information is not intended to replace advice given to you by your health care provider. Make sure you discuss any questions you have with your health care provider.

## 2015-05-09 NOTE — Progress Notes (Addendum)
Subjective:   Steven Munoz is a 74 y.o. male who presents for Medicare Annual/Subsequent preventive examination.  Review of Systems:     HRA assessment completed during visit; [name]; Patient is here for Annual Wellness Assessment The Patient was informed that this wellness visit is to identify risk and educate on how to reduce risk for increase disease through lifestyle changes.    Personalized education given regarding risk on the problem list that are currently being medically managed;  Lifestyle changes reviewed for htn; s/p CABG in 2016 Weight loss;   Diet: Breakfast  c cheerios or banana; blueberries; drinks water; sometimes will eat an egg; Lunch; Depends; gained weight since surgery; 260 prior to surgery; Up 34 lbs this year; has to get in control when he goes out to eat Eating sweets; ice cream;  Hx has lost 40lb in the past; cut back on sweets;  When he loses weight; he goes low carb; no white; eat greens Motivation 1 -5 (score 3 or 4 ) physical in one month that is motivating him., Grandchildren motivate him  Exercise: work out 3 times a week; weights; bike; Herbalist Exercises 90 min 3 days a week in spite of knee surgeries   Hobby: [place]; 3 levels of groups;  Fly fishing some; did like sailing  [place]; has full basement and steps Long term plan is to stay in home. Urinary or fecal incontinence reviewed; both at hs; up q 3 hours going to bathroom Has spoke with Dr. Linna Darner;  Educated on prevention falls; Exercise, toning and strengthening;  Had personal trainer for years  Regular vision checks:   Review Firearm safety as appropriate;  [employer] Biomedical scientist; sure Driving: seatbelt Sun protection   Screenings overdue:  Annual labs due in August 2015 Ophthalmology exam; once a year in april; no glaucoma; Dr. Kathrin Penner Immunizations; Prevnar 13 / will update today TD due July 2021; Shingles-  educated to check with insurer; not sure if he has had;  Colonoscopy; 07/2023 EKG 12/30/2013 Hearing: hearing aids Dental: ongoing currently  Current Care Team reviewed and updated Dr. Kara Mead; Pulmonary Dr. Johnsie Cancel - cardiology Harriet Masson; podiatry [name];      Objective:    Vitals: BP 140/78 mmHg  Pulse 53  Ht 6\' 1"  (1.854 m)  Wt 294 lb 12 oz (133.698 kg)  BMI 38.90 kg/m2  SpO2 94%  Tobacco History  Smoking status  . Former Smoker -- 1.00 packs/day for 6 years  . Types: Cigarettes  . Quit date: 10/28/1966  Smokeless tobacco  . Never Used    Comment: smoked 1962- 1968, up to < 1 ppd     Counseling given: Not Answered   Past Medical History  Diagnosis Date  . Murmur   . Hypothyroidism   . Hyperlipidemia   . Hypertension   . Diverticulosis   . GERD (gastroesophageal reflux disease)     S/P dilation  2005  . Sleep apnea     on CPAP; Dr [name]  . Dermatophytosis of nail   . Dysmetabolic syndrome   . Gout   . Colonic polyp 2002, 2005, 2009    [name]  . 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 Nephrolithiasis     X 1  . Pneumonia age 42    treated as inpatient   Past Surgical History  Procedure Laterality  Date  . Coronary artery bypass graft  2010    Median sternotomy, extracorporeal circulation, coronary bypass graft surgery x3 using a left internal mammary artery graft to left anterior descending coronary artery, with a sequential  saphenous vein graft to posterior descending and posterolateral branches  of the right coronary artery.  Endoscopic vein harvesting from the right  . Athroscopy      right knee X2, left knee X1  . Colonoscopy  2002 & 2009 &2014    polypectomy; [name]  . Replacement total knee      right 2003  . Replacement total knee      partial Left March 2009  . Nose surgery  11/20/12    L max antrectomy;septoplasty;turbinate reduction. Dr  [name]  . Nail alvusion Bilateral     Hallux  . 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 Hypertension Mother   . Transient ischemic attack Mother   . Lung cancer Mother     liver cancer, smoker, TIA  . Heart attack Father 58  . Heart attack Brother 26    smoker  . Diabetes Neg Hx    History  Sexual Activity  . Sexual Activity: Not on file    Outpatient Encounter Prescriptions as of 05/09/2015  Medication Sig  . amoxicillin (AMOXIL) 500 MG capsule Take 1 capsule (500 mg total) by mouth 3 (three) times daily.  Marland Kitchen aspirin 325 MG tablet Take 325 mg by mouth daily.    . Coenzyme Q10 (COQ10) 100 MG CAPS Take 100 mg by mouth daily.   . Coenzyme Q10 (COQ10) 200 MG CAPS Take by mouth.  . FOLIC ACID PO Take 106 mg by mouth daily.    . hydrochlorothiazide (MICROZIDE) 12.5 MG capsule Take 1 capsule (12.5 mg total) by mouth daily.  Marland Kitchen ibuprofen (ADVIL,MOTRIN) 200 MG tablet Take 600 mg by mouth every 6 (six) hours as needed.  Marland Kitchen levothyroxine (SYNTHROID, LEVOTHROID) 25 MCG tablet 1 po daily except 1 and 1/2 tab tues,thurs,sat (Patient taking differently: 1 po daily except 1 and 1/2 tab tues,thurs,sat Take 1 tablet by mouth on Sunday, Monday, Wednesday, Friday and take 1/2 tablet by mouth on Tuesday, Thursday, and Saturday)  . losartan (COZAAR) 100 MG tablet Take 1 tablet (100 mg total) by mouth daily.  . metoprolol tartrate (LOPRESSOR) 25 MG tablet Take 0.5 tablets (12.5 mg total) by mouth daily.  . Multiple Vitamins-Minerals (CENTRUM SILVER PO) Take 1 tablet by mouth daily.  . Multiple Vitamins-Minerals (PRESERVISION AREDS PO) Take 2 capsules by mouth daily.  Marland Kitchen omeprazole (PRILOSEC) 20 MG capsule Take 1 capsule (20 mg total) by mouth daily.  . rosuvastatin (CRESTOR) 20 MG tablet Take 1 tablet (20 mg total) by mouth daily.  Marland Kitchen HYDROcodone-homatropine (HYDROMET) 5-1.5 MG/5ML syrup Take 5 mLs by mouth every 6  (six) hours as needed for cough. (Patient not taking: Reported on 05/09/2015)   No facility-administered encounter medications on file as of 05/09/2015.    Activities of Daily Living In your present state of health, do you have any difficulty performing the following activities: 05/09/2015 01/30/2015  Hearing? N Y  Vision? N N  Difficulty concentrating or making decisions? N N  Walking or climbing stairs? N Y  Dressing or bathing? N N  Doing errands, shopping? N N    Patient Care Team: Hendricks Limes, MD as PCP - General   Assessment:  Objective: Discussed BP and tries to monitor sodium States his BP is <140 to 130 at home and is trying to cut back on BP med; discussed losing weight will assist in this goal; reviewed metabolic syndrome   Pt determined a personalized goal; see patient goals;  Desires to lose weight  Taking meds without issues; no barriers identified  Labs were and fup visit noted with MD if labs are due to be re-drawn.  Stress: Recommendations for managing stress if assessed as a factor;   No Risk for hepatitis or high risk social behavior identified via hepatitis screen  Educated on shingles and follow up with insurance company for co-pays or charges applied to Part D benefit. Educated on Vaccines; will take prevnar today Safety issues reviewed;  Cognition assessed by AD8; Score 0 MMSE deferred as the patient stated they had no memory issues; No identified risk were noted; The patient was oriented x 3; appropriate in dress and manner and no objective failures at ADL's or IADL's.   Depression screen negative;   Functional status reviewed; no losses in function x 1 year  Bowel and bladder issues assessed; States he does get up and go to the bathroom at hs; has discussed with Dr. Linna Darner. Is seeing Dr. Linna Darner in August  End of life planning was discussed; completed  .Exercise Activities and Dietary recomendations    Goals    . Weight < 250 lb  (113.399 kg)     Plan to loose weight;  Cut out carbs; no white / rice, pasta,       Fall Risk Fall Risk  05/09/2015 01/30/2015 05/31/2013  Falls in the past year? No No No   Depression Screen PHQ 2/9 Scores 05/09/2015 01/30/2015 05/31/2013  PHQ - 2 Score 0 0 0    Cognitive Testing No flowsheet data found.  Immunization History  Administered Date(s) Administered  . Influenza Split 08/21/2011, 08/26/2012  . Influenza Whole 07/29/2008, 08/10/2009, 08/28/2010  . Influenza,inj,Quad PF,36+ Mos 08/31/2013, 08/16/2014  . Pneumococcal Conjugate-13 05/09/2015  . Pneumococcal Polysaccharide-23 05/27/2011  . Td 05/18/2010   Screening Tests Health Maintenance  Topic Date Due  . ZOSTAVAX  11/29/2000  . PNA vac Low Risk Adult (2 of 2 - PCV13) 05/26/2012  . INFLUENZA VACCINE  05/29/2015  . TETANUS/TDAP  05/18/2020  . COLONOSCOPY  07/29/2023      Plan:     Plan is to lose some weight; Discussed cardiac risk; discussed BP; obesity;  Will work on [employer]; exercises; family hx of heart disease;   During the course of the visit the patient was educated and counseled about the following appropriate screening and preventive services:   Vaccines to include Pneumoccal, Influenza, Hepatitis B, Td,   Zostavax, HCV/ will take prevnar today  Electrocardiogram 12/30/13  Cardiovascular Disease/ reviewed  Colorectal cancer screening not due  Diabetes screening/ glucose normal  Prostate Cancer Screening/ deferred  Glaucoma screening/ eye exams normal  Nutrition counseling   Smoking cessation counseling  Patient Instructions (the written plan) was given to the patient.    Wynetta Fines RN  05/09/2015   Medical screening examination/treatment/procedure(s) were performed by non-physician practitioner and as supervising physician I was immediately available for consultation/collaboration. I agree with above. Unice Cobble, MD

## 2015-06-06 ENCOUNTER — Encounter: Payer: Self-pay | Admitting: Internal Medicine

## 2015-06-06 ENCOUNTER — Ambulatory Visit (INDEPENDENT_AMBULATORY_CARE_PROVIDER_SITE_OTHER): Payer: Medicare HMO | Admitting: Internal Medicine

## 2015-06-06 ENCOUNTER — Other Ambulatory Visit (INDEPENDENT_AMBULATORY_CARE_PROVIDER_SITE_OTHER): Payer: Medicare HMO

## 2015-06-06 VITALS — BP 124/75 | HR 55 | Temp 98.1°F | Resp 20 | Ht 73.0 in | Wt 295.0 lb

## 2015-06-06 DIAGNOSIS — E785 Hyperlipidemia, unspecified: Secondary | ICD-10-CM | POA: Diagnosis not present

## 2015-06-06 DIAGNOSIS — I10 Essential (primary) hypertension: Secondary | ICD-10-CM

## 2015-06-06 DIAGNOSIS — Z8639 Personal history of other endocrine, nutritional and metabolic disease: Secondary | ICD-10-CM | POA: Diagnosis not present

## 2015-06-06 DIAGNOSIS — E039 Hypothyroidism, unspecified: Secondary | ICD-10-CM

## 2015-06-06 DIAGNOSIS — Z86010 Personal history of colonic polyps: Secondary | ICD-10-CM

## 2015-06-06 DIAGNOSIS — Z8739 Personal history of other diseases of the musculoskeletal system and connective tissue: Secondary | ICD-10-CM

## 2015-06-06 LAB — LIPID PANEL
CHOL/HDL RATIO: 3
Cholesterol: 132 mg/dL (ref 0–200)
HDL: 48.3 mg/dL (ref >39.00)
LDL Cholesterol: 62 mg/dL (ref 0–99)
NONHDL: 83.79
TRIGLYCERIDES: 107 mg/dL (ref 0.0–149.0)
VLDL: 21.4 mg/dL (ref 0.0–40.0)

## 2015-06-06 LAB — URIC ACID: URIC ACID, SERUM: 5.8 mg/dL (ref 4.0–7.8)

## 2015-06-06 LAB — CBC WITH DIFFERENTIAL/PLATELET
Basophils Absolute: 0 10*3/uL (ref 0.0–0.1)
Basophils Relative: 0.7 % (ref 0.0–3.0)
EOS ABS: 0.1 10*3/uL (ref 0.0–0.7)
EOS PCT: 2.7 % (ref 0.0–5.0)
HCT: 43 % (ref 39.0–52.0)
Hemoglobin: 15 g/dL (ref 13.0–17.0)
Lymphocytes Relative: 33.7 % (ref 12.0–46.0)
Lymphs Abs: 1.6 10*3/uL (ref 0.7–4.0)
MCHC: 35 g/dL (ref 30.0–36.0)
MCV: 94.5 fl (ref 78.0–100.0)
MONOS PCT: 8.8 % (ref 3.0–12.0)
Monocytes Absolute: 0.4 10*3/uL (ref 0.1–1.0)
Neutro Abs: 2.5 10*3/uL (ref 1.4–7.7)
Neutrophils Relative %: 54.1 % (ref 43.0–77.0)
Platelets: 170 10*3/uL (ref 150.0–400.0)
RBC: 4.55 Mil/uL (ref 4.22–5.81)
RDW: 13.9 % (ref 11.5–15.5)
WBC: 4.7 10*3/uL (ref 4.0–10.5)

## 2015-06-06 LAB — BASIC METABOLIC PANEL
BUN: 16 mg/dL (ref 6–23)
CO2: 28 meq/L (ref 19–32)
Calcium: 9.6 mg/dL (ref 8.4–10.5)
Chloride: 104 mEq/L (ref 96–112)
Creatinine, Ser: 0.87 mg/dL (ref 0.40–1.50)
GFR: 91.04 mL/min (ref >60.00)
Glucose, Bld: 109 mg/dL — ABNORMAL HIGH (ref 70–99)
POTASSIUM: 3.9 meq/L (ref 3.5–5.1)
SODIUM: 141 meq/L (ref 135–145)

## 2015-06-06 LAB — HEPATIC FUNCTION PANEL
ALK PHOS: 46 U/L (ref 39–117)
ALT: 25 U/L (ref 0–53)
AST: 30 U/L (ref 0–37)
Albumin: 4.6 g/dL (ref 3.5–5.2)
Bilirubin, Direct: 0.2 mg/dL (ref 0.0–0.3)
Total Bilirubin: 1.4 mg/dL — ABNORMAL HIGH (ref 0.2–1.2)
Total Protein: 7.2 g/dL (ref 6.0–8.3)

## 2015-06-06 LAB — TSH: TSH: 3.08 u[IU]/mL (ref 0.35–4.50)

## 2015-06-06 NOTE — Assessment & Plan Note (Signed)
CBC

## 2015-06-06 NOTE — Assessment & Plan Note (Signed)
TSH 

## 2015-06-06 NOTE — Patient Instructions (Signed)
  Your next office appointment will be determined based upon review of your pending labs. Those written interpretation of the lab results and instructions will be transmitted to you by My Chart  Critical results will be called.   Followup as needed for any active or acute issue. Please report any significant change in your symptoms. 

## 2015-06-06 NOTE — Assessment & Plan Note (Signed)
Uric acid

## 2015-06-06 NOTE — Progress Notes (Signed)
   Subjective:    Patient ID: Steven Munoz, male    DOB: 05/25/1941, 74 y.o.   MRN: [id number]  HPI The patient is here to assess status of active health conditions.  PMH, FH, & Social History reviewed & updated.No change in [place] as recorded.  He is on a modified heart healthy diet. He does not add salt at the table. He works out of the gym for at least 60 minutes as treadmill, bike, weights without cardio pulmonary symptoms.  He is compliant with CPAP.  Colonoscopy is up-to-date. Has no active GI symptoms. He did have loose stools which resolved with a probiotic  He has nocturia at least twice nightly. This been evaluated by Dr. Alinda Money, Urologist.    Review of Systems  Chest pain, palpitations, tachycardia, exertional dyspnea, paroxysmal nocturnal dyspnea, claudication or edema are absent. No unexplained weight loss, abdominal pain, significant dyspepsia, dysphagia, melena, rectal bleeding, or persistently small caliber stools. Dysuria, pyuria, hematuria, frequency, or polyuria are denied. Change in hair, skin, nails denied. No bowel changes of constipation or diarrhea. No intolerance to heat or cold.      Objective:   Physical Exam  Pertinent or positive findings include: Bilateral hearing aids present. Abdomen protuberant.Reflexes 0+ at the knees. Crepitus of the knees present bilaterally.  General appearance :adequately nourished; in no distress.  Eyes: No conjunctival inflammation or scleral icterus is present.  Oral exam:  Lips and gums are healthy appearing.There is no oropharyngeal erythema or exudate noted. Dental hygiene is good.  Heart:  Normal rate and regular rhythm. S1 and S2 normal without gallop, murmur, click, rub or other extra sounds    Lungs:Chest clear to auscultation; no wheezes, rhonchi,rales ,or rubs present.No increased work of breathing.   Abdomen: bowel sounds normal, soft and non-tender without masses, organomegaly or hernias noted.  No guarding or  rebound.  Vascular : all pulses equal ; no bruits present.  Skin:Warm & dry.  Intact without suspicious lesions or rashes ; no tenting or jaundice   Lymphatic: No lymphadenopathy is noted about the head, neck, axilla.   Neuro: Strength, tone  normal.         Assessment & Plan:  See Current Assessment & Plan in Problem List under specific Diagnosis

## 2015-06-06 NOTE — Assessment & Plan Note (Signed)
Blood pressure goals reviewed. BMET 

## 2015-06-06 NOTE — Assessment & Plan Note (Addendum)
Lipids, LFTs, TSH  

## 2015-06-06 NOTE — Progress Notes (Signed)
Pre visit review using our clinic review tool, if applicable. No additional management support is needed unless otherwise documented below in the visit note. 

## 2015-06-09 ENCOUNTER — Other Ambulatory Visit (INDEPENDENT_AMBULATORY_CARE_PROVIDER_SITE_OTHER): Payer: Medicare HMO

## 2015-06-09 DIAGNOSIS — R739 Hyperglycemia, unspecified: Secondary | ICD-10-CM

## 2015-06-09 LAB — HEMOGLOBIN A1C: Hgb A1c MFr Bld: 5.5 % (ref 4.6–6.5)

## 2015-06-16 ENCOUNTER — Other Ambulatory Visit: Payer: Self-pay | Admitting: Emergency Medicine

## 2015-06-16 MED ORDER — LEVOTHYROXINE SODIUM 25 MCG PO TABS: ORAL_TABLET | ORAL | Status: DC

## 2015-06-16 MED ORDER — OMEPRAZOLE 20 MG PO CPDR
20.0000 mg | DELAYED_RELEASE_CAPSULE | Freq: Every day | ORAL | Status: DC
Start: 2015-06-16 — End: 2016-04-03

## 2015-07-06 ENCOUNTER — Other Ambulatory Visit: Payer: Self-pay | Admitting: Emergency Medicine

## 2015-07-06 DIAGNOSIS — I10 Essential (primary) hypertension: Secondary | ICD-10-CM

## 2015-07-06 MED ORDER — LOSARTAN POTASSIUM 100 MG PO TABS: 100.0000 mg | ORAL_TABLET | Freq: Every day | ORAL | Status: DC

## 2015-07-06 MED ORDER — ROSUVASTATIN CALCIUM 20 MG PO TABS: 20.0000 mg | ORAL_TABLET | Freq: Every day | ORAL | Status: DC

## 2015-07-09 IMAGING — RF DG FLUORO GUIDE NDL PLC/BX
1 series · 1 of 1 positions shown · non-contrast
Comparison: Multiple priors.

FLUORO GUIDED NEEDLE PLACEMENT

INTRA-ARTICULAR NEEDLE PLACEMENT,  RIGHT HIP, FOR THERAPEUTIC
STEROID INJECTION
CLINICAL DATA: Right hip osteoarthritis

[Series 1: dg fluoro guide ndl plc/bx · 1 of 1 slices shown]
[im 1/1]
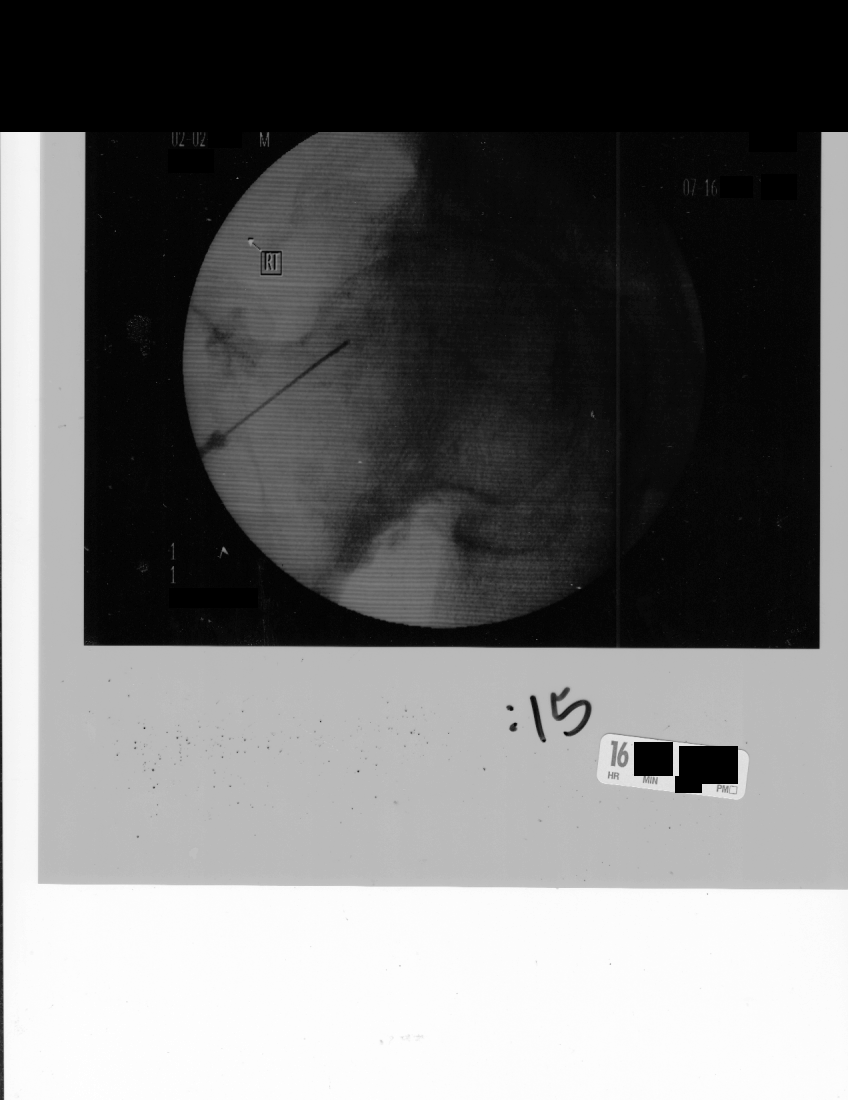

[1 of 1 positions shown; findings below may reference images not displayed]

FINDINGS: Informed, written consent was obtained prior to the
procedure.  The  right femoral neck was localized fluoroscopically,
and the skin was marked.  A thorough Betadine   scrub of the skin
was performed, and a sterile drape was applied.  The skin and
subcutaneous soft tissues were anesthetized with 1%  lidocaine.

Subsequently, a  22 gauge 3.5 inch  spinal needle was advanced to
the lateral aspect of the inferior femoral head.  Injection with  2
mL of Omnipaque 180 confirmed the needle tip to be intra-articular.
Subsequently, 120 mg Depo-Medrol was injected into the hip joint,
and flushed with 5 ml  of 0.5% Sensorcaine.  Post procedure, the
patient was improved and able to ambulate with much less pain.
IMPRESSION: Technically successful intra-articular steroid placement,  right
hip.

## 2015-07-25 ENCOUNTER — Ambulatory Visit (INDEPENDENT_AMBULATORY_CARE_PROVIDER_SITE_OTHER): Payer: Medicare HMO

## 2015-07-25 DIAGNOSIS — Z23 Encounter for immunization: Secondary | ICD-10-CM | POA: Diagnosis not present

## 2015-08-16 DIAGNOSIS — G4733 Obstructive sleep apnea (adult) (pediatric): Secondary | ICD-10-CM | POA: Diagnosis not present

## 2015-08-17 DIAGNOSIS — M76891 Other specified enthesopathies of right lower limb, excluding foot: Secondary | ICD-10-CM | POA: Diagnosis not present

## 2015-08-17 DIAGNOSIS — M25551 Pain in right hip: Secondary | ICD-10-CM | POA: Diagnosis not present

## 2015-08-17 DIAGNOSIS — Z96641 Presence of right artificial hip joint: Secondary | ICD-10-CM | POA: Insufficient documentation

## 2015-08-17 DIAGNOSIS — Z09 Encounter for follow-up examination after completed treatment for conditions other than malignant neoplasm: Secondary | ICD-10-CM | POA: Diagnosis not present

## 2015-09-07 DIAGNOSIS — G4733 Obstructive sleep apnea (adult) (pediatric): Secondary | ICD-10-CM | POA: Diagnosis not present

## 2015-09-11 DIAGNOSIS — M62830 Muscle spasm of back: Secondary | ICD-10-CM | POA: Diagnosis not present

## 2015-09-11 DIAGNOSIS — M5416 Radiculopathy, lumbar region: Secondary | ICD-10-CM | POA: Diagnosis not present

## 2015-09-11 DIAGNOSIS — M545 Low back pain: Secondary | ICD-10-CM | POA: Diagnosis not present

## 2015-09-11 DIAGNOSIS — M9903 Segmental and somatic dysfunction of lumbar region: Secondary | ICD-10-CM | POA: Diagnosis not present

## 2015-09-13 DIAGNOSIS — M545 Low back pain: Secondary | ICD-10-CM | POA: Diagnosis not present

## 2015-09-13 DIAGNOSIS — M62830 Muscle spasm of back: Secondary | ICD-10-CM | POA: Diagnosis not present

## 2015-09-13 DIAGNOSIS — M9903 Segmental and somatic dysfunction of lumbar region: Secondary | ICD-10-CM | POA: Diagnosis not present

## 2015-09-13 DIAGNOSIS — M5416 Radiculopathy, lumbar region: Secondary | ICD-10-CM | POA: Diagnosis not present

## 2015-09-16 DIAGNOSIS — G4733 Obstructive sleep apnea (adult) (pediatric): Secondary | ICD-10-CM | POA: Diagnosis not present

## 2015-09-18 DIAGNOSIS — M545 Low back pain: Secondary | ICD-10-CM | POA: Diagnosis not present

## 2015-09-18 DIAGNOSIS — M62830 Muscle spasm of back: Secondary | ICD-10-CM | POA: Diagnosis not present

## 2015-09-18 DIAGNOSIS — M5416 Radiculopathy, lumbar region: Secondary | ICD-10-CM | POA: Diagnosis not present

## 2015-09-18 DIAGNOSIS — M9903 Segmental and somatic dysfunction of lumbar region: Secondary | ICD-10-CM | POA: Diagnosis not present

## 2015-09-25 DIAGNOSIS — M5416 Radiculopathy, lumbar region: Secondary | ICD-10-CM | POA: Diagnosis not present

## 2015-09-25 DIAGNOSIS — M545 Low back pain: Secondary | ICD-10-CM | POA: Diagnosis not present

## 2015-09-25 DIAGNOSIS — M62830 Muscle spasm of back: Secondary | ICD-10-CM | POA: Diagnosis not present

## 2015-09-25 DIAGNOSIS — M9903 Segmental and somatic dysfunction of lumbar region: Secondary | ICD-10-CM | POA: Diagnosis not present

## 2015-09-27 DIAGNOSIS — M76891 Other specified enthesopathies of right lower limb, excluding foot: Secondary | ICD-10-CM | POA: Diagnosis not present

## 2015-09-27 DIAGNOSIS — Z96641 Presence of right artificial hip joint: Secondary | ICD-10-CM | POA: Diagnosis not present

## 2015-09-27 DIAGNOSIS — M25551 Pain in right hip: Secondary | ICD-10-CM | POA: Diagnosis not present

## 2015-09-28 DIAGNOSIS — M62830 Muscle spasm of back: Secondary | ICD-10-CM | POA: Diagnosis not present

## 2015-09-28 DIAGNOSIS — M9903 Segmental and somatic dysfunction of lumbar region: Secondary | ICD-10-CM | POA: Diagnosis not present

## 2015-09-28 DIAGNOSIS — M5416 Radiculopathy, lumbar region: Secondary | ICD-10-CM | POA: Diagnosis not present

## 2015-09-28 DIAGNOSIS — M545 Low back pain: Secondary | ICD-10-CM | POA: Diagnosis not present

## 2015-10-16 DIAGNOSIS — G4733 Obstructive sleep apnea (adult) (pediatric): Secondary | ICD-10-CM | POA: Diagnosis not present

## 2015-11-16 ENCOUNTER — Other Ambulatory Visit: Payer: Self-pay | Admitting: Internal Medicine

## 2015-11-16 DIAGNOSIS — G4733 Obstructive sleep apnea (adult) (pediatric): Secondary | ICD-10-CM | POA: Diagnosis not present

## 2015-12-17 DIAGNOSIS — G4733 Obstructive sleep apnea (adult) (pediatric): Secondary | ICD-10-CM | POA: Diagnosis not present

## 2016-01-14 DIAGNOSIS — G4733 Obstructive sleep apnea (adult) (pediatric): Secondary | ICD-10-CM | POA: Diagnosis not present

## 2016-01-18 ENCOUNTER — Ambulatory Visit: Payer: Medicare HMO | Admitting: Pulmonary Disease

## 2016-01-24 ENCOUNTER — Encounter: Payer: Self-pay | Admitting: Pulmonary Disease

## 2016-01-24 ENCOUNTER — Ambulatory Visit (INDEPENDENT_AMBULATORY_CARE_PROVIDER_SITE_OTHER): Payer: Medicare HMO | Admitting: Pulmonary Disease

## 2016-01-24 VITALS — BP 138/84 | HR 57 | Ht 73.0 in | Wt 290.8 lb

## 2016-01-24 DIAGNOSIS — G4730 Sleep apnea, unspecified: Secondary | ICD-10-CM

## 2016-01-24 DIAGNOSIS — R600 Localized edema: Secondary | ICD-10-CM

## 2016-01-24 NOTE — Patient Instructions (Signed)
Take 2 tabs of thiazide x 5days Call back if swelling persists CPAP is set at 8 cm

## 2016-01-24 NOTE — Progress Notes (Signed)
   Subjective:    Patient ID: Steven Munoz, male    DOB: 07-24-1941, 75 y.o.   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  PSG (289 Lbs) 2009 -showed severe OSA with predominant hypopneas, AHI 31/h corrected by CPAP +8 with large quattro mask   PLms seemed to improve with titration  01/24/2016  Chief Complaint  Patient presents with  . Follow-up    Patient doing well on CPAP. Wants to try new mask, current mask bothers his eyes.     States that CPAP is working well, wakes up rested, denies daytime fatigue or somnolence, no snoring per wife, mask is working well, pressure is okay, no dryness   12/2014 49m download 12/2014 >> excellent usage on 8 cm, no residual events Rx new CPAP  Download 12/2015  - on 8 cm - good usage, no residual events , no leak   He has bipedal edema - takes 12.5 mg HCTz   Echo 04/2009 gr 2 DD    Review of Systems Patient denies significant dyspnea,cough, hemoptysis,  chest pain, palpitations, pedal edema, orthopnea, paroxysmal nocturnal dyspnea, lightheadedness, nausea, vomiting, abdominal or  leg pains      Objective:   Physical Exam  Gen. Pleasant, obese, in no distress ENT - no lesions, no post nasal drip Neck: No JVD, no thyromegaly, no carotid bruits Lungs: no use of accessory muscles, no dullness to percussion, decreased without rales or rhonchi  Cardiovascular: Rhythm regular, heart sounds  normal, no murmurs or gallops, no peripheral edema Musculoskeletal: No deformities, no cyanosis or clubbing , no tremors       Assessment & Plan:

## 2016-01-25 DIAGNOSIS — H524 Presbyopia: Secondary | ICD-10-CM | POA: Diagnosis not present

## 2016-01-25 DIAGNOSIS — H25813 Combined forms of age-related cataract, bilateral: Secondary | ICD-10-CM | POA: Diagnosis not present

## 2016-01-25 DIAGNOSIS — H353121 Nonexudative age-related macular degeneration, left eye, early dry stage: Secondary | ICD-10-CM | POA: Diagnosis not present

## 2016-01-25 DIAGNOSIS — R600 Localized edema: Secondary | ICD-10-CM | POA: Insufficient documentation

## 2016-01-25 DIAGNOSIS — H01001 Unspecified blepharitis right upper eyelid: Secondary | ICD-10-CM | POA: Diagnosis not present

## 2016-01-25 NOTE — Assessment & Plan Note (Signed)
Take 2 tabs of thiazide x 5days Call back if swelling persists  this may be related to diastolic dysfunction or varicose veins

## 2016-01-25 NOTE — Assessment & Plan Note (Signed)
CPAP is set at 8 cm CPAP supplies will be renewed for a year  Weight loss encouraged, compliance with goal of at least 4-6 hrs every night is the expectation. Advised against medications with sedative side effects Cautioned against driving when sleepy - understanding that sleepiness will vary on a day to day basis  

## 2016-01-29 DIAGNOSIS — Z96641 Presence of right artificial hip joint: Secondary | ICD-10-CM | POA: Diagnosis not present

## 2016-01-29 DIAGNOSIS — Z09 Encounter for follow-up examination after completed treatment for conditions other than malignant neoplasm: Secondary | ICD-10-CM | POA: Diagnosis not present

## 2016-01-29 DIAGNOSIS — M76891 Other specified enthesopathies of right lower limb, excluding foot: Secondary | ICD-10-CM | POA: Diagnosis not present

## 2016-01-29 DIAGNOSIS — M25551 Pain in right hip: Secondary | ICD-10-CM | POA: Diagnosis not present

## 2016-02-08 ENCOUNTER — Telehealth: Payer: Self-pay | Admitting: *Deleted

## 2016-02-08 ENCOUNTER — Encounter: Payer: Self-pay | Admitting: Internal Medicine

## 2016-02-08 ENCOUNTER — Encounter: Payer: Self-pay | Admitting: *Deleted

## 2016-02-08 NOTE — Telephone Encounter (Signed)
Pre-Visit Call completed with patient and chart updated.   Pre-Visit Info documented in Specialty Comments under [employer].    

## 2016-02-12 ENCOUNTER — Encounter: Payer: Self-pay | Admitting: Internal Medicine

## 2016-02-12 ENCOUNTER — Ambulatory Visit (INDEPENDENT_AMBULATORY_CARE_PROVIDER_SITE_OTHER): Payer: Medicare HMO | Admitting: Internal Medicine

## 2016-02-12 VITALS — BP 132/76 | HR 66 | Temp 98.2°F | Ht 73.0 in | Wt 288.5 lb

## 2016-02-12 DIAGNOSIS — I10 Essential (primary) hypertension: Secondary | ICD-10-CM | POA: Diagnosis not present

## 2016-02-12 DIAGNOSIS — E785 Hyperlipidemia, unspecified: Secondary | ICD-10-CM | POA: Diagnosis not present

## 2016-02-12 DIAGNOSIS — Z09 Encounter for follow-up examination after completed treatment for conditions other than malignant neoplasm: Secondary | ICD-10-CM

## 2016-02-12 DIAGNOSIS — I25810 Atherosclerosis of coronary artery bypass graft(s) without angina pectoris: Secondary | ICD-10-CM

## 2016-02-12 DIAGNOSIS — R739 Hyperglycemia, unspecified: Secondary | ICD-10-CM

## 2016-02-12 DIAGNOSIS — E039 Hypothyroidism, unspecified: Secondary | ICD-10-CM | POA: Diagnosis not present

## 2016-02-12 DIAGNOSIS — I4891 Unspecified atrial fibrillation: Secondary | ICD-10-CM

## 2016-02-12 MED ORDER — HYDROCHLOROTHIAZIDE 25 MG PO TABS: 25.0000 mg | ORAL_TABLET | Freq: Every day | ORAL | Status: DC

## 2016-02-12 NOTE — Progress Notes (Signed)
Subjective:    Patient ID: Steven Munoz, male    DOB: [birth year], 75 y.o.   MRN: [id number]  DOS:  02/12/2016 Type of visit - description :  New patient, transferring from Dr. Linna Darner Interval history: High cholesterol: Last FLP satisfactory, on Crestor HTN: Good compliance of medication, recently hydrochlorothiazide dose was increased due to edema, edema better. Hypothyroidism: On Synthroid. Due for labs Has not seen cardiology in a while, last note reviewed.   Review of Systems  Denies chest pain, difficulty breathing or palpitations No nausea, vomiting, diarrhea. He remains active, exercises most days, diet definitely needs improvement per patient.  Past Medical History  Diagnosis Date  . Murmur   . Hypothyroidism   . Hyperlipidemia   . Hypertension   . Diverticulosis   . GERD (gastroesophageal reflux disease)     S/P dilation  2005  . Sleep apnea     on CPAP; Dr [name]  . Dermatophytosis of nail   . Dysmetabolic syndrome   . Gout   . Colonic polyp 2002, 2005, 2009    [name]  . 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 Nephrolithiasis     X 1  . Pneumonia age 33    treated as inpatient    Past Surgical History  Procedure Laterality Date  . Coronary artery bypass graft  2010    Median sternotomy, extracorporeal circulation, coronary bypass graft surgery x3 using a left internal mammary artery graft to left anterior descending coronary artery, with a sequential  saphenous vein graft to posterior descending and posterolateral branches  of the right coronary artery.  Endoscopic vein harvesting from the right  . Athroscopy      right knee X2, left knee X1  . Colonoscopy  2002 & 2009 &2014    polypectomy; [name]  . Replacement total knee      right 2003  . Replacement total knee      partial Left March 2009  . Nose surgery  11/20/12    L max antrectomy;septoplasty;turbinate  reduction. [name]  . Nail alvusion Bilateral     Hallux  . 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 Marital Status: Married    Spouse Name: N/A  . Number of Children: 2  . Years of Education: N/A   Occupational History  . retired- Charity fundraiser     Social History Main Topics  . Smoking status: Former Smoker -- 1.00 packs/day for 6 years    Types: Cigarettes    Quit date: 10/28/1966  . Smokeless tobacco: Never Used     Comment: smoked [place], up to < 1 ppd  . Alcohol Use: 1.8 oz/week    3 Glasses of wine per week     Comment: socially  . Drug Use: No  . Sexual Activity: Not on file   Other Topics Concern  . Not on file   Social History Narrative   Socially; lives with spouse   Children 2; dtr lives here and son lives [place]   3 grandchildren; 71, 56, 3.5        Medication List       This list is accurate as of: 02/12/16 11:59 PM.  Always use your most recent med list.  ALIGN PO  Take 1 capsule by mouth as needed. Reported on 02/08/2016     aspirin 325 MG tablet  Take 325 mg by mouth daily.     CoQ10 200 MG Caps  Take by mouth.     FOLIC ACID PO  Take 0000000 mg by mouth daily.     hydrochlorothiazide 25 MG tablet  Commonly known as:  HYDRODIURIL  Take 1 tablet (25 mg total) by mouth daily.     ibuprofen 200 MG tablet  Commonly known as:  ADVIL,MOTRIN  Take 600 mg by mouth every 6 (six) hours as needed.     levothyroxine 25 MCG tablet  Commonly known as:  SYNTHROID, LEVOTHROID  1 po daily except 1 and 1/2 tab tues,thurs,sat     losartan 100 MG tablet  Commonly known as:  COZAAR  Take 1 tablet (100 mg total) by mouth daily.     Magnesium 400 MG Caps  Take 400 mg by mouth daily.     metoprolol tartrate 25 MG tablet  Commonly known as:  LOPRESSOR  Take 0.5 tablets (12.5 mg total) by mouth daily.     OMEGA 3 500 PO  Take 500 mg by mouth daily.      omeprazole 20 MG capsule  Commonly known as:  PRILOSEC  Take 1 capsule (20 mg total) by mouth daily.     CENTRUM SILVER PO  Take 1 tablet by mouth daily.     PRESERVISION AREDS PO  Take 2 capsules by mouth daily.     rosuvastatin 20 MG tablet  Commonly known as:  CRESTOR  Take 1 tablet (20 mg total) by mouth daily.           Objective:   Physical Exam BP 132/76 mmHg  Pulse 66  Temp(Src) 98.2 F (36.8 C) (Oral)  Ht 6\' 1"  (1.854 m)  Wt 288 lb 8 oz (130.863 kg)  BMI 38.07 kg/m2  SpO2 96%  General:   Well developed, well nourished . NAD.  Neck: No  thyromegaly  HEENT:  Normocephalic . Face symmetric, atraumatic Lungs:  CTA B Normal respiratory effort, no intercostal retractions, no accessory muscle use. Heart: RRR,  no murmur.  Trace pretibial edema bilaterally  Abdomen:  Not distended, soft, non-tender. No rebound or rigidity.   Skin: Exposed areas without rash. Not pale. Not jaundice Neurologic:  alert & oriented X3.  Speech normal, gait appropriate for age and unassisted Strength symmetric and appropriate for age.  Psych: Cognition and judgment appear intact.  Cooperative with normal attention span and concentration.  Behavior appropriate. No anxious or depressed appearing.    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 Refill medications as needed. Continue HCTZ, losartan, metoprolol. Hyperlipidemia: Last FLP satisfactory, continue Crestor Hypothyroidism: Continue Synthroid, labs. CAD: Asymptomatic, continue aspirin and CV RF control. Refer to cardiology (due for ROV) RTC 4-5  months, CPX (has an appointment for August 2017  already)

## 2016-02-12 NOTE — Patient Instructions (Addendum)
GO TO THE LAB :      Get the blood work     GO TO THE FRONT DESK Schedule your next appointment for a  yearly checkup, fasting in 4 to 5 months

## 2016-02-12 NOTE — Progress Notes (Signed)
Pre visit review using our clinic review tool, if applicable. No additional management support is needed unless otherwise documented below in the visit note. 

## 2016-02-13 DIAGNOSIS — Z09 Encounter for follow-up examination after completed treatment for conditions other than malignant neoplasm: Secondary | ICD-10-CM | POA: Insufficient documentation

## 2016-02-13 DIAGNOSIS — I25110 Atherosclerotic heart disease of native coronary artery with unstable angina pectoris: Secondary | ICD-10-CM | POA: Insufficient documentation

## 2016-02-13 DIAGNOSIS — I2510 Atherosclerotic heart disease of native coronary artery without angina pectoris: Secondary | ICD-10-CM | POA: Insufficient documentation

## 2016-02-13 LAB — BASIC METABOLIC PANEL
BUN: 17 mg/dL (ref 6–23)
CHLORIDE: 100 meq/L (ref 96–112)
CO2: 30 meq/L (ref 19–32)
Calcium: 10.1 mg/dL (ref 8.4–10.5)
Creatinine, Ser: 0.93 mg/dL (ref 0.40–1.50)
GFR: 84.14 mL/min (ref >60.00)
Glucose, Bld: 99 mg/dL (ref 70–99)
Potassium: 3.7 mEq/L (ref 3.5–5.1)
SODIUM: 139 meq/L (ref 135–145)

## 2016-02-13 LAB — ALT: ALT: 21 U/L (ref 0–53)

## 2016-02-13 LAB — HEMOGLOBIN A1C: HEMOGLOBIN A1C: 5.7 % (ref 4.6–6.5)

## 2016-02-13 LAB — AST: AST: 24 U/L (ref 0–37)

## 2016-02-13 LAB — TSH: TSH: 4.72 u[IU]/mL — ABNORMAL HIGH (ref 0.35–4.50)

## 2016-02-13 NOTE — Assessment & Plan Note (Addendum)
Hyperglycemia: Discussed diet and exercise, check A1c HTN: Recently, hydrochlorothiazide dose increased due to edema, BP is very good, check a BMP. Refill medications as needed. Continue HCTZ, losartan, metoprolol. Hyperlipidemia: Last FLP satisfactory, continue Crestor Hypothyroidism: Continue Synthroid, labs. CAD: Asymptomatic, continue aspirin and CV RF control. Refer to cardiology (due for ROV) RTC 4-5  months, CPX (has an appointment for August 2017 already)

## 2016-02-14 DIAGNOSIS — G4733 Obstructive sleep apnea (adult) (pediatric): Secondary | ICD-10-CM | POA: Diagnosis not present

## 2016-02-15 MED ORDER — LEVOTHYROXINE SODIUM 50 MCG PO TABS: ORAL_TABLET | ORAL | Status: DC

## 2016-02-15 NOTE — Addendum Note (Signed)
Addended by: Damita Dunnings D on: 02/15/2016 02:00 PM   Modules accepted: Orders, Medications

## 2016-02-16 ENCOUNTER — Encounter: Payer: Self-pay | Admitting: Pulmonary Disease

## 2016-03-19 DIAGNOSIS — M7611 Psoas tendinitis, right hip: Secondary | ICD-10-CM | POA: Diagnosis not present

## 2016-03-26 DIAGNOSIS — M76891 Other specified enthesopathies of right lower limb, excluding foot: Secondary | ICD-10-CM | POA: Diagnosis not present

## 2016-03-26 DIAGNOSIS — I10 Essential (primary) hypertension: Secondary | ICD-10-CM | POA: Diagnosis not present

## 2016-03-26 DIAGNOSIS — E039 Hypothyroidism, unspecified: Secondary | ICD-10-CM | POA: Diagnosis not present

## 2016-03-26 DIAGNOSIS — Z96641 Presence of right artificial hip joint: Secondary | ICD-10-CM | POA: Diagnosis not present

## 2016-03-26 DIAGNOSIS — Z01818 Encounter for other preprocedural examination: Secondary | ICD-10-CM | POA: Diagnosis not present

## 2016-03-26 DIAGNOSIS — G4733 Obstructive sleep apnea (adult) (pediatric): Secondary | ICD-10-CM | POA: Diagnosis not present

## 2016-03-28 HISTORY — PX: TENDON RELEASE: SHX230

## 2016-04-03 ENCOUNTER — Other Ambulatory Visit: Payer: Self-pay | Admitting: Internal Medicine

## 2016-04-03 NOTE — Telephone Encounter (Signed)
Routing to patient's new pcp to handle---former dr hopper patient

## 2016-04-04 DIAGNOSIS — M7611 Psoas tendinitis, right hip: Secondary | ICD-10-CM | POA: Diagnosis not present

## 2016-04-04 DIAGNOSIS — Z951 Presence of aortocoronary bypass graft: Secondary | ICD-10-CM | POA: Diagnosis not present

## 2016-04-04 DIAGNOSIS — M109 Gout, unspecified: Secondary | ICD-10-CM | POA: Diagnosis not present

## 2016-04-04 DIAGNOSIS — Z87891 Personal history of nicotine dependence: Secondary | ICD-10-CM | POA: Diagnosis not present

## 2016-04-04 DIAGNOSIS — I2510 Atherosclerotic heart disease of native coronary artery without angina pectoris: Secondary | ICD-10-CM | POA: Diagnosis not present

## 2016-04-04 DIAGNOSIS — K219 Gastro-esophageal reflux disease without esophagitis: Secondary | ICD-10-CM | POA: Diagnosis not present

## 2016-04-04 DIAGNOSIS — Z79899 Other long term (current) drug therapy: Secondary | ICD-10-CM | POA: Diagnosis not present

## 2016-04-04 DIAGNOSIS — Z881 Allergy status to other antibiotic agents status: Secondary | ICD-10-CM | POA: Diagnosis not present

## 2016-04-04 DIAGNOSIS — G4733 Obstructive sleep apnea (adult) (pediatric): Secondary | ICD-10-CM | POA: Diagnosis not present

## 2016-04-04 DIAGNOSIS — Z9109 Other allergy status, other than to drugs and biological substances: Secondary | ICD-10-CM | POA: Diagnosis not present

## 2016-04-16 ENCOUNTER — Other Ambulatory Visit (INDEPENDENT_AMBULATORY_CARE_PROVIDER_SITE_OTHER): Payer: Medicare HMO

## 2016-04-16 DIAGNOSIS — E039 Hypothyroidism, unspecified: Secondary | ICD-10-CM | POA: Diagnosis not present

## 2016-04-16 LAB — TSH: TSH: 4.78 u[IU]/mL — ABNORMAL HIGH (ref 0.35–4.50)

## 2016-04-18 MED ORDER — LEVOTHYROXINE SODIUM 50 MCG PO TABS: 50.0000 ug | ORAL_TABLET | Freq: Every day | ORAL | Status: DC

## 2016-04-18 NOTE — Addendum Note (Signed)
Addended byDamita Dunnings D on: 04/18/2016 01:35 PM   Modules accepted: Orders

## 2016-05-19 NOTE — Progress Notes (Addendum)
Patient ID: Steven Munoz, male   DOB: May 29, 1941, 75 y.o.   MRN: [id number] Ariv is S/P CABG with periop afib in 05/2009 . Marland Kitchen He has good LV function. . There has been no SSCP, palpitations, PND, orthopnea, and only trace edema . He is compliant with his meds for BP and cholesterol. His BP is borderline and he had LE edema. HCTZ was added to his meds in March 2012 . He has had improvement in his BP and edema. He has to stick with a low carb diet as his weight is up again Right hip arthritis sees [name] Has had two cortisone injections. BP a bit high and HR a bit low with first degree AV block  8/14 LDL 84 with normal LFTls  Wearing his CPAP followed by [name]   Had right hip surgery at [facility] with [name] [phone] op hip dislocated and has not been quite right  Has pain RLE with known varicosities Only had ASA post op no anticoagulation  Duplex LE 6/15 no DVT  No chest pain  Some urinary frequency not helped by cialis   CABG note from Dr [name] office reviewed Surgery for ostial LAD/distal RCA and ostial PDA Lesions. EF normal Circumflex not grafted   LIMA to LAD SVG sequential to PDA/PLB  ROS: Denies fever, malais, weight loss, blurry vision, decreased visual acuity, cough, sputum, SOB, hemoptysis, pleuritic pain, palpitaitons, heartburn, abdominal pain, melena, lower extremity edema, claudication, or rash.  All other systems reviewed and negative  General: Affect appropriate Overweight white male  HEENT: normal Neck supple with no adenopathy JVP normal no bruits no thyromegaly Lungs clear with no wheezing and good diaphragmatic motion Heart:  S1/S2 no murmur, no rub, gallop or click PMI normal Abdomen: benighn, BS positve, no tenderness, no AAA no bruit.  No HSM or HJR Distal pulses intact with no bruits No edema Neuro non-focal Skin warm and dry No muscular weakness   Current Outpatient Prescriptions  Medication Sig Dispense Refill  . aspirin 325  MG tablet Take 325 mg by mouth daily.      . Coenzyme Q10 (COQ10) 200 MG CAPS Take by mouth.    . hydrochlorothiazide (HYDRODIURIL) 25 MG tablet Take 1 tablet (25 mg total) by mouth daily. 90 tablet 3  . levothyroxine (SYNTHROID, LEVOTHROID) 50 MCG tablet Take 1 tablet (50 mcg total) by mouth daily before breakfast. 30 tablet 1  . losartan (COZAAR) 100 MG tablet Take 1 tablet (100 mg total) by mouth daily. 90 tablet 3  . Magnesium 400 MG CAPS Take 400 mg by mouth daily.    . metoprolol tartrate (LOPRESSOR) 25 MG tablet Take 0.5 tablets (12.5 mg total) by mouth daily. 90 tablet 1  . Multiple Vitamins-Minerals (CENTRUM SILVER PO) Take 1 tablet by mouth daily.    . Multiple Vitamins-Minerals (PRESERVISION AREDS PO) Take 2 capsules by mouth daily.    . Omega-3 Fatty Acids (OMEGA 3 500 PO) Take 500 mg by mouth daily.    Marland Kitchen omeprazole (PRILOSEC) 20 MG capsule Take 1 capsule (20 mg total) by mouth daily. 90 capsule 3  . rosuvastatin (CRESTOR) 20 MG tablet Take 1 tablet (20 mg total) by mouth daily. 90 tablet 3   No current facility-administered medications for this visit.     Allergies  Povidone-iodine; Ofloxacin; and Povidone iodine  Electrocardiogram:  3/15  SB rate 49 otherwise normal  05/20/16  SR rate 63 PR 210 PVC otherwise normal  Assessment and Plan  CAD/CABG: 2010 will get graft op report from [place] office PAF: post op no recurrence ASA and beta blocker  HTN: Well controlled.  Continue current medications and low sodium Dash type diet.    Thyroid:  Lab Results  Component Value Date   TSH 4.78 (H) 04/16/2016    Cholesterol  Lab Results  Component Value Date   LDLCALC 62 06/06/2015    Ortho: post right hip surgery [name]  Post tendon release in June with relief of pain  First Degree: no change on ECG today no high grade AV block   F/U in 6 months    Jenkins Rouge

## 2016-05-20 ENCOUNTER — Ambulatory Visit (INDEPENDENT_AMBULATORY_CARE_PROVIDER_SITE_OTHER): Payer: Medicare HMO | Admitting: Cardiovascular Disease

## 2016-05-20 ENCOUNTER — Encounter: Payer: Self-pay | Admitting: Cardiovascular Disease

## 2016-05-20 VITALS — BP 130/70 | HR 54 | Ht 72.0 in | Wt 292.8 lb

## 2016-05-20 DIAGNOSIS — I480 Paroxysmal atrial fibrillation: Secondary | ICD-10-CM

## 2016-05-20 NOTE — Patient Instructions (Signed)

## 2016-05-30 ENCOUNTER — Other Ambulatory Visit (INDEPENDENT_AMBULATORY_CARE_PROVIDER_SITE_OTHER): Payer: Medicare HMO

## 2016-05-30 DIAGNOSIS — E039 Hypothyroidism, unspecified: Secondary | ICD-10-CM | POA: Diagnosis not present

## 2016-05-30 LAB — TSH: TSH: 4.32 u[IU]/mL (ref 0.35–4.50)

## 2016-06-04 ENCOUNTER — Other Ambulatory Visit: Payer: Self-pay | Admitting: Internal Medicine

## 2016-06-04 DIAGNOSIS — I10 Essential (primary) hypertension: Secondary | ICD-10-CM

## 2016-06-05 NOTE — Telephone Encounter (Signed)
Routing to patient's new pcp to handle 

## 2016-06-06 ENCOUNTER — Encounter: Payer: Self-pay | Admitting: Internal Medicine

## 2016-06-06 ENCOUNTER — Ambulatory Visit (INDEPENDENT_AMBULATORY_CARE_PROVIDER_SITE_OTHER): Payer: Medicare HMO | Admitting: Internal Medicine

## 2016-06-06 VITALS — BP 124/68 | HR 51 | Temp 97.9°F | Resp 14 | Ht 72.0 in | Wt 293.4 lb

## 2016-06-06 DIAGNOSIS — E785 Hyperlipidemia, unspecified: Secondary | ICD-10-CM | POA: Diagnosis not present

## 2016-06-06 DIAGNOSIS — Z0000 Encounter for general adult medical examination without abnormal findings: Secondary | ICD-10-CM | POA: Diagnosis not present

## 2016-06-06 DIAGNOSIS — E039 Hypothyroidism, unspecified: Secondary | ICD-10-CM | POA: Diagnosis not present

## 2016-06-06 DIAGNOSIS — I25810 Atherosclerosis of coronary artery bypass graft(s) without angina pectoris: Secondary | ICD-10-CM

## 2016-06-06 DIAGNOSIS — I10 Essential (primary) hypertension: Secondary | ICD-10-CM

## 2016-06-06 DIAGNOSIS — Z23 Encounter for immunization: Secondary | ICD-10-CM

## 2016-06-06 DIAGNOSIS — R69 Illness, unspecified: Secondary | ICD-10-CM | POA: Diagnosis not present

## 2016-06-06 LAB — CBC WITH DIFFERENTIAL/PLATELET
Basophils Absolute: 0 10*3/uL (ref 0.0–0.1)
Basophils Relative: 0.6 % (ref 0.0–3.0)
EOS PCT: 5.7 % — AB (ref 0.0–5.0)
Eosinophils Absolute: 0.3 10*3/uL (ref 0.0–0.7)
HCT: 44 % (ref 39.0–52.0)
Hemoglobin: 15 g/dL (ref 13.0–17.0)
LYMPHS ABS: 1.9 10*3/uL (ref 0.7–4.0)
Lymphocytes Relative: 36 % (ref 12.0–46.0)
MCHC: 34.1 g/dL (ref 30.0–36.0)
MCV: 95.3 fl (ref 78.0–100.0)
MONOS PCT: 8 % (ref 3.0–12.0)
Monocytes Absolute: 0.4 10*3/uL (ref 0.1–1.0)
NEUTROS ABS: 2.6 10*3/uL (ref 1.4–7.7)
NEUTROS PCT: 49.7 % (ref 43.0–77.0)
PLATELETS: 163 10*3/uL (ref 150.0–400.0)
RBC: 4.61 Mil/uL (ref 4.22–5.81)
RDW: 13.2 % (ref 11.5–15.5)
WBC: 5.2 10*3/uL (ref 4.0–10.5)

## 2016-06-06 LAB — LIPID PANEL
CHOL/HDL RATIO: 3
Cholesterol: 127 mg/dL (ref 0–200)
HDL: 43.4 mg/dL (ref >39.00)
LDL Cholesterol: 61 mg/dL (ref 0–99)
NonHDL: 83.85
TRIGLYCERIDES: 114 mg/dL (ref 0.0–149.0)
VLDL: 22.8 mg/dL (ref 0.0–40.0)

## 2016-06-06 NOTE — Patient Instructions (Signed)
Get your blood work before you leave   Think about getting a healthcare power of attorney  Next visit 6 months, no fasting  Fall Prevention and Home Safety Falls cause injuries and can affect all age groups. It is possible to use preventive measures to significantly decrease the likelihood of falls. There are many simple measures which can make your home safer and prevent falls. OUTDOORS  Repair cracks and edges of walkways and driveways.  Remove high doorway thresholds.  Trim shrubbery on the main path into your home.  Have good outside lighting.  Clear walkways of tools, rocks, debris, and clutter.  Check that handrails are not broken and are securely fastened. Both sides of steps should have handrails.  Have leaves, snow, and ice cleared regularly.  Use sand or salt on walkways during winter months.  In the garage, clean up grease or oil spills. BATHROOM  Install night lights.  Install grab bars by the toilet and in the tub and shower.  Use non-skid mats or decals in the tub or shower.  Place a plastic non-slip stool in the shower to sit on, if needed.  Keep floors dry and clean up all water on the floor immediately.  Remove soap buildup in the tub or shower on a regular basis.  Secure bath mats with non-slip, double-sided rug tape.  Remove throw rugs and tripping hazards from the floors. BEDROOMS  Install night lights.  Make sure a bedside light is easy to reach.  Do not use oversized bedding.  Keep a telephone by your bedside.  Have a firm chair with side arms to use for getting dressed.  Remove throw rugs and tripping hazards from the floor. KITCHEN  Keep handles on pots and pans turned toward the center of the stove. Use back burners when possible.  Clean up spills quickly and allow time for drying.  Avoid walking on wet floors.  Avoid hot utensils and knives.  Position shelves so they are not too high or low.  Place commonly used objects  within easy reach.  If necessary, use a sturdy step stool with a grab bar when reaching.  Keep electrical cables out of the way.  Do not use floor polish or wax that makes floors slippery. If you must use wax, use non-skid floor wax.  Remove throw rugs and tripping hazards from the floor. STAIRWAYS  Never leave objects on stairs.  Place handrails on both sides of stairways and use them. Fix any loose handrails. Make sure handrails on both sides of the stairways are as long as the stairs.  Check carpeting to make sure it is firmly attached along stairs. Make repairs to worn or loose carpet promptly.  Avoid placing throw rugs at the top or bottom of stairways, or properly secure the rug with carpet tape to prevent slippage. Get rid of throw rugs, if possible.  Have an electrician put in a light switch at the top and bottom of the stairs. OTHER FALL PREVENTION TIPS  Wear low-heel or rubber-soled shoes that are supportive and fit well. Wear closed toe shoes.  When using a stepladder, make sure it is fully opened and both spreaders are firmly locked. Do not climb a closed stepladder.  Add color or contrast paint or tape to grab bars and handrails in your home. Place contrasting color strips on first and last steps.  Learn and use mobility aids as needed. Install an electrical emergency response system.  Turn on lights to avoid dark areas.  Replace light bulbs that burn out immediately. Get light switches that glow.  Arrange furniture to create clear pathways. Keep furniture in the same place.  Firmly attach carpet with non-skid or double-sided tape.  Eliminate uneven floor surfaces.  Select a carpet pattern that does not visually hide the edge of steps.  Be aware of all pets. OTHER HOME SAFETY TIPS  Set the water temperature for 120 F (48.8 C).  Keep emergency numbers on or near the telephone.  Keep smoke detectors on every level of the home and near sleeping  areas. Document Released: 10/04/2002 Document Revised: 04/14/2012 Document Reviewed: 01/03/2012 [facility] Patient Information 2015 [place], [place]. This information is not intended to replace advice given to you by your health care provider. Make sure you discuss any questions you have with your health care provider.   Preventive Care for Adults Ages 70 and over  Blood pressure check.** / Every 1 to 2 years.  Lipid and cholesterol check.**/ Every 5 years beginning at age 8.  Lung cancer screening. / Every year if you are aged 54-80 years and have a 30-pack-year history of smoking and currently smoke or have quit within the past 15 years. Yearly screening is stopped once you have quit smoking for at least 15 years or develop a health problem that would prevent you from having lung cancer treatment.  Fecal occult blood test (FOBT) of stool. / Every year beginning at age 87 and continuing until age 41. You may not have to do this test if you get a colonoscopy every 10 years.  Flexible sigmoidoscopy** or colonoscopy.** / Every 5 years for a flexible sigmoidoscopy or every 10 years for a colonoscopy beginning at age 68 and continuing until age 31.  Hepatitis C blood test.** / For all people born from [birth year] through 1965 and any individual with known risks for hepatitis C.  Abdominal aortic aneurysm (AAA) screening.** / A one-time screening for ages 41 to 67 years who are current or former smokers.  Skin self-exam. / Monthly.  Influenza vaccine. / Every year.  Tetanus, diphtheria, and acellular pertussis (Tdap/Td) vaccine.** / 1 dose of Td every 10 years.  Varicella vaccine.** / Consult your health care provider.  Zoster vaccine.** / 1 dose for adults aged 35 years or older.  Pneumococcal 13-valent conjugate (PCV13) vaccine.** / Consult your health care provider.  Pneumococcal polysaccharide (PPSV23) vaccine.** / 1 dose for all adults aged 78 years and older.  Meningococcal vaccine.** /  Consult your health care provider.  Hepatitis A vaccine.** / Consult your health care provider.  Hepatitis B vaccine.** / Consult your health care provider.  Haemophilus influenzae type b (Hib) vaccine.** / Consult your health care provider. **Family history and personal history of risk and conditions may change your health care provider's recommendations. Document Released: 12/10/2001 Document Revised: 10/19/2013 Document Reviewed: 03/11/2011 [facility] Patient Information 2015 [place], [place]. This information is not intended to replace advice given to you by your health care provider. Make sure you discuss any questions you have with your health care provider.

## 2016-06-06 NOTE — Progress Notes (Signed)
Subjective:    Patient ID: Steven Munoz, male    DOB: [birth year], 75 y.o.   MRN: [id number]  DOS:  06/06/2016 Type of visit - description : Complete physical exam  Interval history:  HTN: Good med compliance, ambulatory BPs 120, 130. Rarely in the 140s. High cholesterol: On Crestor, no apparent side effects Hypothyroidism: Good compliance with medications. CAD: last cardiology note reviewed  Review of Systems Constitutional: No fever. No chills. No unexplained wt changes. No unusual sweats  HEENT: No dental problems, no ear discharge, no facial swelling, no voice changes. No eye discharge, no eye  redness , no  intolerance to light   Respiratory: No wheezing , no  difficulty breathing. No cough , no mucus production  Cardiovascular: No CP, no leg swelling , no  Palpitations  GI: no nausea, no vomiting, no diarrhea , no  abdominal pain.  No blood in the stools. No dysphagia, no odynophagia    Endocrine: No polyphagia, no polyuria , no polydipsia  GU: No dysuria, gross hematuria, difficulty urinating. No urinary urgency, no frequency. + Nocturia, at baseline for years  Musculoskeletal: No joint swellings or unusual aches or pains  Skin: No change in the color of the skin, palor , no  Rash  Allergic, immunologic: No environmental allergies , no  food allergies  Neurological: No dizziness no  syncope. No headaches. No diplopia, no slurred, no slurred speech, no motor deficits, no facial  Numbness  Hematological: No enlarged lymph nodes, no easy bruising , no unusual bleedings  Psychiatry: No suicidal ideas, no hallucinations, no beavior problems, no confusion.  No unusual/severe anxiety, no depression   Past Medical History:  Diagnosis Date  . CAD (coronary artery disease) CABG  05/29/09    OPERATIVE PROCEDURES:  Median sternotomy, extracorporeal circulation,   . Colonic polyp 2002, 2005, 2009   [name]  . Dermatophytosis of nail   . Diverticulosis   . Dysmetabolic  syndrome   . GERD (gastroesophageal reflux disease)    S/P dilation  2005  . Gout   . Hyperlipidemia   . Hypertension   . Hypothyroidism   . Murmur   . Nephrolithiasis    X 1  . Pneumonia age 56   treated as inpatient  . Sleep apnea    on CPAP; Dr [name]    Past Surgical History:  Procedure Laterality Date  . athroscopy     right knee X2, left knee X1  . COLONOSCOPY  2002 & 2009 &2014   polypectomy; [name]  . CORONARY ARTERY BYPASS GRAFT  2010   Median sternotomy, extracorporeal circulation, coronary bypass graft surgery x3 using a left internal mammary artery graft to left anterior descending coronary artery, with a sequential  saphenous vein graft to posterior descending and posterolateral branches  of the right coronary artery.  Endoscopic vein harvesting from the right  . Nail Alvusion Bilateral    Hallux  . NOSE SURGERY  11/20/12   L max antrectomy;septoplasty;turbinate reduction. [name]  . REPLACEMENT TOTAL KNEE     right 2003  . REPLACEMENT TOTAL KNEE     partial Left March 2009  . TENDON RELEASE Right 03/2016   from R hip. @ [name]  . TOTAL HIP ARTHROPLASTY  02/22/2014   right hip; Dr [facility]    Social History   Social History  . Marital status: Married    Spouse name: N/A  . Number of children: 2  . Years of education: N/A  Occupational History  . retired- tech sales  [place] History Main Topics  . Smoking status: Former Smoker    Packs/day: 1.00    Years: 6.00    Types: Cigarettes    Quit date: 10/28/1966  . Smokeless tobacco: Never Used     Comment: smoked [place], up to < 1 ppd  . Alcohol use 1.8 oz/week    3 Glasses of wine per week     Comment: socially  . Drug use: No  . Sexual activity: Not on file   Other Topics Concern  . Not on file   Social History Narrative   Socially; lives with spouse   Children 2; dtr lives here and son lives [place]   3 grandchildren; 34, 8, 3.5     Family  History  Problem Relation Age of Onset  . Hypertension Mother   . Transient ischemic attack Mother   . Lung cancer Mother     liver cancer, smoker, TIA  . Heart attack Father 30  . Heart attack Brother 25    smoker  . Diabetes Neg Hx   . Colon cancer Neg Hx   . Prostate cancer Neg Hx        Medication List       Accurate as of 06/06/16 11:59 PM. Always use your most recent med list.          aspirin 325 MG tablet Take 325 mg by mouth daily.   CoQ10 200 MG Caps Take by mouth.   hydrochlorothiazide 25 MG tablet Commonly known as:  HYDRODIURIL Take 1 tablet (25 mg total) by mouth daily.   levothyroxine 50 MCG tablet Commonly known as:  SYNTHROID, LEVOTHROID Take 1 tablet (50 mcg total) by mouth daily before breakfast.   losartan 100 MG tablet Commonly known as:  COZAAR Take 1 tablet (100 mg total) by mouth daily.   Magnesium 400 MG Caps Take 400 mg by mouth daily.   metoprolol tartrate 25 MG tablet Commonly known as:  LOPRESSOR Take 0.5 tablets (12.5 mg total) by mouth daily.   OMEGA 3 500 PO Take 500 mg by mouth daily.   omeprazole 20 MG capsule Commonly known as:  PRILOSEC Take 1 capsule (20 mg total) by mouth daily.   CENTRUM SILVER PO Take 1 tablet by mouth daily.   PRESERVISION AREDS PO Take 2 capsules by mouth daily.   rosuvastatin 20 MG tablet Commonly known as:  CRESTOR Take 1 tablet (20 mg total) by mouth daily.          Objective:   Physical Exam BP 124/68 (BP Location: Left Arm, Patient Position: Sitting, Cuff Size: Normal)   Pulse (!) 51   Temp 97.9 F (36.6 C) (Oral)   Resp 14   Ht 6' (1.829 m)   Wt 293 lb 6 oz (133.1 kg)   SpO2 94%   BMI 39.79 kg/m   General:   Well developed, well nourished . NAD.  Neck: No  thyromegaly  HEENT:  Normocephalic . Face symmetric, atraumatic Lungs:  CTA B Normal respiratory effort, no intercostal retractions, no accessory muscle use. Heart: RRR,  no murmur.  No pretibial edema  bilaterally  Abdomen:  Not distended, soft, non-tender. No rebound or rigidity.   Skin: Exposed areas without rash. Not pale. Not jaundice Neurologic:  alert & oriented X3.  Speech normal, gait appropriate for age and unassisted Strength symmetric and appropriate for age.  Psych: Cognition and judgment appear  intact.  Cooperative with normal attention span and concentration.  Behavior appropriate. No anxious or depressed appearing.    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 Diet and exercise discussed HTN: Continue HCTZ, losartan, Lopressor. Last  BMP satisfactory. The great majority of BP readings are within normal hyperlipidemia: Continue Crestor, check a FLP. Last LFTs normal Hypothyroidism: Continue Synthroid 50 mg, last TSH 4.32, he feels well, no change CAD: asx, saw cardiology recently, stable. LUTS: Sx are stable over time, only has some nocturia. RTC 6 months

## 2016-06-06 NOTE — Assessment & Plan Note (Addendum)
Td 2011, pnm 23- 2012, prevnar - 2016;  zostavax 05-2016  CCS: multiple cscopes, last 08-2013 Dr Cher Nakai, per notes next 2019  Last PSA 2013 (stable x years), pro-cons of further screening discussed, elected no further screening  Consulate: Diet, exercise, fall prevention, healthcare power of attorney

## 2016-06-06 NOTE — Progress Notes (Signed)
Pre visit review using our clinic review tool, if applicable. No additional management support is needed unless otherwise documented below in the visit note. 

## 2016-06-07 NOTE — Assessment & Plan Note (Signed)
Hyperglycemia: Last A1c stable. Diet and exercise discussed HTN: Continue HCTZ, losartan, Lopressor. Last  BMP satisfactory. The great majority of BP readings are within normal hyperlipidemia: Continue Crestor, check a FLP. Last LFTs normal Hypothyroidism: Continue Synthroid 50 mg, last TSH 4.32, he feels well, no change CAD: asx, saw cardiology recently, stable. LUTS: Sx are stable over time, only has some nocturia. RTC 6 months

## 2016-06-17 ENCOUNTER — Other Ambulatory Visit: Payer: Self-pay | Admitting: Internal Medicine

## 2016-06-17 ENCOUNTER — Other Ambulatory Visit: Payer: Self-pay

## 2016-06-17 DIAGNOSIS — M9903 Segmental and somatic dysfunction of lumbar region: Secondary | ICD-10-CM | POA: Diagnosis not present

## 2016-06-17 DIAGNOSIS — M545 Low back pain: Secondary | ICD-10-CM | POA: Diagnosis not present

## 2016-06-17 DIAGNOSIS — M5416 Radiculopathy, lumbar region: Secondary | ICD-10-CM | POA: Diagnosis not present

## 2016-06-17 DIAGNOSIS — M62830 Muscle spasm of back: Secondary | ICD-10-CM | POA: Diagnosis not present

## 2016-06-17 MED ORDER — LEVOTHYROXINE SODIUM 50 MCG PO TABS: 50.0000 ug | ORAL_TABLET | Freq: Every day | ORAL | 5 refills | Status: DC

## 2016-06-18 ENCOUNTER — Telehealth: Payer: Self-pay | Admitting: Internal Medicine

## 2016-06-18 DIAGNOSIS — I10 Essential (primary) hypertension: Secondary | ICD-10-CM

## 2016-06-18 MED ORDER — HYDROCHLOROTHIAZIDE 25 MG PO TABS: 25.0000 mg | ORAL_TABLET | Freq: Every day | ORAL | 2 refills | Status: DC

## 2016-06-18 MED ORDER — LEVOTHYROXINE SODIUM 50 MCG PO TABS: 50.0000 ug | ORAL_TABLET | Freq: Every day | ORAL | 2 refills | Status: DC

## 2016-06-18 MED ORDER — LOSARTAN POTASSIUM 100 MG PO TABS: 100.0000 mg | ORAL_TABLET | Freq: Every day | ORAL | 2 refills | Status: DC

## 2016-06-18 MED ORDER — ROSUVASTATIN CALCIUM 20 MG PO TABS: 20.0000 mg | ORAL_TABLET | Freq: Every day | ORAL | 2 refills | Status: DC

## 2016-06-18 MED ORDER — METOPROLOL TARTRATE 25 MG PO TABS: 12.5000 mg | ORAL_TABLET | Freq: Every day | ORAL | 2 refills | Status: DC

## 2016-06-18 NOTE — Telephone Encounter (Signed)
Rxs sent

## 2016-06-18 NOTE — Telephone Encounter (Signed)
°  Relationship to patient: Self  Can be reached:  [phone]   Pharmacy:  [place], [place] [phone] (Phone) [phone] (Fax)     Reason for call: Request refill on rosuvastatin (CRESTOR) 20 MG tablet [id number]  hydrochlorothiazide (HYDRODIURIL) 25 MG tablet [id number]  metoprolol tartrate (LOPRESSOR) 25 MG tablet [id number]   levothyroxine (SYNTHROID, LEVOTHROID) 50 MCG tablet [id number]   Please sen 90 day supply

## 2016-06-19 DIAGNOSIS — M9903 Segmental and somatic dysfunction of lumbar region: Secondary | ICD-10-CM | POA: Diagnosis not present

## 2016-06-19 DIAGNOSIS — M545 Low back pain: Secondary | ICD-10-CM | POA: Diagnosis not present

## 2016-06-19 DIAGNOSIS — M5416 Radiculopathy, lumbar region: Secondary | ICD-10-CM | POA: Diagnosis not present

## 2016-06-19 DIAGNOSIS — M62830 Muscle spasm of back: Secondary | ICD-10-CM | POA: Diagnosis not present

## 2016-06-26 DIAGNOSIS — M62830 Muscle spasm of back: Secondary | ICD-10-CM | POA: Diagnosis not present

## 2016-06-26 DIAGNOSIS — M545 Low back pain: Secondary | ICD-10-CM | POA: Diagnosis not present

## 2016-06-26 DIAGNOSIS — M9903 Segmental and somatic dysfunction of lumbar region: Secondary | ICD-10-CM | POA: Diagnosis not present

## 2016-06-26 DIAGNOSIS — M5416 Radiculopathy, lumbar region: Secondary | ICD-10-CM | POA: Diagnosis not present

## 2016-07-04 DIAGNOSIS — M898X6 Other specified disorders of bone, lower leg: Secondary | ICD-10-CM | POA: Diagnosis not present

## 2016-07-04 DIAGNOSIS — M5416 Radiculopathy, lumbar region: Secondary | ICD-10-CM | POA: Diagnosis not present

## 2016-07-04 DIAGNOSIS — M62830 Muscle spasm of back: Secondary | ICD-10-CM | POA: Diagnosis not present

## 2016-07-04 DIAGNOSIS — M9903 Segmental and somatic dysfunction of lumbar region: Secondary | ICD-10-CM | POA: Diagnosis not present

## 2016-07-04 DIAGNOSIS — M545 Low back pain: Secondary | ICD-10-CM | POA: Diagnosis not present

## 2016-07-04 DIAGNOSIS — L03115 Cellulitis of right lower limb: Secondary | ICD-10-CM | POA: Diagnosis not present

## 2016-07-04 DIAGNOSIS — M25561 Pain in right knee: Secondary | ICD-10-CM | POA: Diagnosis not present

## 2016-07-12 DIAGNOSIS — S8001XD Contusion of right knee, subsequent encounter: Secondary | ICD-10-CM | POA: Diagnosis not present

## 2016-07-22 DIAGNOSIS — M545 Low back pain: Secondary | ICD-10-CM | POA: Diagnosis not present

## 2016-07-22 DIAGNOSIS — M5416 Radiculopathy, lumbar region: Secondary | ICD-10-CM | POA: Diagnosis not present

## 2016-07-22 DIAGNOSIS — M62830 Muscle spasm of back: Secondary | ICD-10-CM | POA: Diagnosis not present

## 2016-07-22 DIAGNOSIS — M9903 Segmental and somatic dysfunction of lumbar region: Secondary | ICD-10-CM | POA: Diagnosis not present

## 2016-08-12 DIAGNOSIS — M62830 Muscle spasm of back: Secondary | ICD-10-CM | POA: Diagnosis not present

## 2016-08-12 DIAGNOSIS — M9903 Segmental and somatic dysfunction of lumbar region: Secondary | ICD-10-CM | POA: Diagnosis not present

## 2016-08-12 DIAGNOSIS — M545 Low back pain: Secondary | ICD-10-CM | POA: Diagnosis not present

## 2016-08-12 DIAGNOSIS — M5416 Radiculopathy, lumbar region: Secondary | ICD-10-CM | POA: Diagnosis not present

## 2016-08-13 ENCOUNTER — Ambulatory Visit (INDEPENDENT_AMBULATORY_CARE_PROVIDER_SITE_OTHER): Payer: Medicare HMO

## 2016-08-13 DIAGNOSIS — Z23 Encounter for immunization: Secondary | ICD-10-CM

## 2016-08-14 ENCOUNTER — Ambulatory Visit (INDEPENDENT_AMBULATORY_CARE_PROVIDER_SITE_OTHER): Payer: Medicare HMO | Admitting: Internal Medicine

## 2016-08-14 ENCOUNTER — Encounter: Payer: Self-pay | Admitting: Internal Medicine

## 2016-08-14 VITALS — BP 128/78 | HR 69 | Temp 97.6°F | Resp 14 | Ht 72.0 in | Wt 298.0 lb

## 2016-08-14 DIAGNOSIS — L03115 Cellulitis of right lower limb: Secondary | ICD-10-CM | POA: Diagnosis not present

## 2016-08-14 DIAGNOSIS — S8991XD Unspecified injury of right lower leg, subsequent encounter: Secondary | ICD-10-CM | POA: Diagnosis not present

## 2016-08-14 DIAGNOSIS — R600 Localized edema: Secondary | ICD-10-CM

## 2016-08-14 NOTE — Assessment & Plan Note (Signed)
Knee injury after a fall, already cleared by his orthopedic doctor. Cellulitis: Patient described changes consistent with cellulitis, status post Keflex for approximately 27 days area is not warm, red or TTP. It is hyperpigmented. I doubt he has active infection. Recommend leg elevation, hydrocortisone and observation. See instructions Leg swelling: I am somewhat concerned about a DVT, will get a ultrasound this morning.

## 2016-08-14 NOTE — Progress Notes (Signed)
Pre visit review using our clinic review tool, if applicable. No additional management support is needed unless otherwise documented below in the visit note. 

## 2016-08-14 NOTE — Progress Notes (Signed)
Subjective:    Patient ID: Steven Munoz, male    DOB: 14-Jan-1941, 75 y.o.   MRN: [id number]  DOS:  08/14/2016 Type of visit - description : acute  Interval history: Accidental fall few weeks ago, see nurse's note. He injured the right knee.  The pretibial area after the fall got red, swollen, tender to palpation and warm. Subsequently he went to his orthopedic doctor, x-rays were okay but he was diagnosed with cellulitis. He has taken Keflex for a total of 27 days according to the patient. The area looks better but is not completely back to normal.  Review of Systems  Denies fever chills   Past Medical History:  Diagnosis Date  . CAD (coronary artery disease) CABG  05/29/09    OPERATIVE PROCEDURES:  Median sternotomy, extracorporeal circulation,   . Colonic polyp 2002, 2005, 2009   [name]  . Dermatophytosis of nail   . Diverticulosis   . Dysmetabolic syndrome   . GERD (gastroesophageal reflux disease)    S/P dilation  2005  . Gout   . Hyperlipidemia   . Hypertension   . Hypothyroidism   . Murmur   . Nephrolithiasis    X 1  . Pneumonia age 26   treated as inpatient  . Sleep apnea    on CPAP; Dr [name]    Past Surgical History:  Procedure Laterality Date  . athroscopy     right knee X2, left knee X1  . COLONOSCOPY  2002 & 2009 &2014   polypectomy; [name]  . CORONARY ARTERY BYPASS GRAFT  2010   Median sternotomy, extracorporeal circulation, coronary bypass graft surgery x3 using a left internal mammary artery graft to left anterior descending coronary artery, with a sequential  saphenous vein graft to posterior descending and posterolateral branches  of the right coronary artery.  Endoscopic vein harvesting from the right  . Nail Alvusion Bilateral    Hallux  . NOSE SURGERY  11/20/12   L max antrectomy;septoplasty;turbinate reduction. [name]  . REPLACEMENT TOTAL KNEE     right 2003  . REPLACEMENT TOTAL KNEE     partial Left March 2009  .  TENDON RELEASE Right 03/2016   from R hip. @ [name]  . TOTAL HIP ARTHROPLASTY  02/22/2014   right hip; Dr [facility]    Social History   Social History  . Marital status: Married    Spouse name: N/A  . Number of children: 2  . Years of education: N/A   Occupational History  . retired- tech sales  [place] History Main Topics  . Smoking status: Former Smoker    Packs/day: 1.00    Years: 6.00    Types: Cigarettes    Quit date: 10/28/1966  . Smokeless tobacco: Never Used     Comment: smoked [place], up to < 1 ppd  . Alcohol use 1.8 oz/week    3 Glasses of wine per week     Comment: socially  . Drug use: No  . Sexual activity: Not on file   Other Topics Concern  . Not on file   Social History Narrative   Socially; lives with spouse   Children 2; dtr lives here and son lives [place]   3 grandchildren; 52, 51, 3.5        Medication List       Accurate as of 08/14/16  9:27 AM. Always use your most recent med list.  aspirin 325 MG tablet Take 325 mg by mouth daily.   CoQ10 200 MG Caps Take by mouth.   hydrochlorothiazide 25 MG tablet Commonly known as:  HYDRODIURIL Take 1 tablet (25 mg total) by mouth daily.   levothyroxine 50 MCG tablet Commonly known as:  SYNTHROID, LEVOTHROID Take 1 tablet (50 mcg total) by mouth daily before breakfast.   losartan 100 MG tablet Commonly known as:  COZAAR Take 1 tablet (100 mg total) by mouth daily.   Magnesium 400 MG Caps Take 400 mg by mouth daily.   metoprolol tartrate 25 MG tablet Commonly known as:  LOPRESSOR Take 0.5 tablets (12.5 mg total) by mouth daily.   OMEGA 3 500 PO Take 500 mg by mouth daily.   omeprazole 20 MG capsule Commonly known as:  PRILOSEC Take 1 capsule (20 mg total) by mouth daily.   CENTRUM SILVER PO Take 1 tablet by mouth daily.   PRESERVISION AREDS PO Take 2 capsules by mouth daily.   rosuvastatin 20 MG tablet Commonly known as:   CRESTOR Take 1 tablet (20 mg total) by mouth daily.          Objective:   Physical Exam BP 128/78 (BP Location: Left Arm, Patient Position: Sitting, Cuff Size: Normal)   Pulse 69   Temp 97.6 F (36.4 C) (Oral)   Resp 14   Ht 6' (1.829 m)   Wt 298 lb (135.2 kg)   SpO2 96%   BMI 40.42 kg/m  General:   Well developed, well nourished . NAD.  HEENT:  Normocephalic . Face symmetric, atraumatic Lower extremities: Left leg normal Right leg: Calf is 1 cm larger in comparison compared to the left, although is not tender he feels is slightly "tight" compared to the other side. Good pedal pulse, skin distally at the pretibial area is hyperpigmented but there is no warmness or TTP. Skin: Not pale. Not jaundice Neurologic:  alert & oriented X3.  Speech normal, gait appropriate for age and unassisted Psych--  Cognition and judgment appear intact.  Cooperative with normal attention span and concentration.  Behavior appropriate. No anxious or depressed appearing.      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 Cellulitis: Patient described changes consistent with cellulitis, status post Keflex for approximately 27 days area is not warm, red or TTP. It is hyperpigmented. I doubt he has active infection. Recommend leg elevation, hydrocortisone and observation. See instructions Leg swelling: I am somewhat concerned about a DVT, will get a ultrasound this morning.

## 2016-08-14 NOTE — Patient Instructions (Signed)
We are scheduling a ultrasound for today  Keep your leg elevated  Put on your compression stockings early every morning  Hydrocortisone 1% cream apply twice a day  Call if not gradually improving, call anytime if the swelling gets worse or the area gets red, warm

## 2016-08-16 ENCOUNTER — Ambulatory Visit (HOSPITAL_BASED_OUTPATIENT_CLINIC_OR_DEPARTMENT_OTHER)
Admission: RE | Admit: 2016-08-16 | Discharge: 2016-08-16 | Disposition: A | Discharge status: Home / Self Care | Payer: Medicare HMO | Source: Ambulatory Visit | Attending: Internal Medicine | Admitting: Internal Medicine

## 2016-08-16 DIAGNOSIS — L03115 Cellulitis of right lower limb: Secondary | ICD-10-CM | POA: Insufficient documentation

## 2016-09-03 ENCOUNTER — Encounter: Payer: Self-pay | Admitting: Internal Medicine

## 2016-09-03 ENCOUNTER — Ambulatory Visit (INDEPENDENT_AMBULATORY_CARE_PROVIDER_SITE_OTHER): Payer: Medicare HMO | Admitting: Internal Medicine

## 2016-09-03 VITALS — BP 132/68 | HR 58 | Temp 97.7°F | Resp 14 | Ht 72.0 in | Wt 297.1 lb

## 2016-09-03 DIAGNOSIS — L03115 Cellulitis of right lower limb: Secondary | ICD-10-CM

## 2016-09-03 LAB — CBC WITH DIFFERENTIAL/PLATELET
BASOS ABS: 0.1 10*3/uL (ref 0.0–0.1)
Basophils Relative: 0.7 % (ref 0.0–3.0)
EOS ABS: 0.2 10*3/uL (ref 0.0–0.7)
Eosinophils Relative: 2.6 % (ref 0.0–5.0)
HCT: 44.4 % (ref 39.0–52.0)
Hemoglobin: 15.1 g/dL (ref 13.0–17.0)
LYMPHS ABS: 2.2 10*3/uL (ref 0.7–4.0)
LYMPHS PCT: 30.4 % (ref 12.0–46.0)
MCHC: 34.1 g/dL (ref 30.0–36.0)
MCV: 93.6 fl (ref 78.0–100.0)
MONO ABS: 0.6 10*3/uL (ref 0.1–1.0)
Monocytes Relative: 7.9 % (ref 3.0–12.0)
NEUTROS ABS: 4.3 10*3/uL (ref 1.4–7.7)
NEUTROS PCT: 58.4 % (ref 43.0–77.0)
PLATELETS: 188 10*3/uL (ref 150.0–400.0)
RBC: 4.74 Mil/uL (ref 4.22–5.81)
RDW: 13 % (ref 11.5–15.5)
WBC: 7.4 10*3/uL (ref 4.0–10.5)

## 2016-09-03 LAB — SEDIMENTATION RATE: SED RATE: 2 mm/h (ref 0–20)

## 2016-09-03 NOTE — Progress Notes (Signed)
Pre visit review using our clinic review tool, if applicable. No additional management support is needed unless otherwise documented below in the visit note. 

## 2016-09-03 NOTE — Patient Instructions (Signed)
Get your blood work before you leave  Leg elevation  Compression stockings  At nighttime, try Aveeno lotion OTC or hydrocortisone 1% OTC.  If not gradually getting better in the next 2 or 3 week please call, I'll refer you to a dermatologist

## 2016-09-03 NOTE — Progress Notes (Signed)
Subjective:    Patient ID: Steven Munoz, male    DOB: [birth year], 75 y.o.   MRN: [id number]  DOS:  09/03/2016 Type of visit - description : Acute visit Interval history: See previous note. Had cellulitis, ultrasound was negative for DVT, he is doing very well with compression stockings compliance. He is here because they pretibial area is still slightly swollen, very sensitive to touch particularly at night. No fever or chills Edema is well controlled with compression stockings.   Review of Systems   Past Medical History:  Diagnosis Date  . CAD (coronary artery disease) CABG  05/29/09    OPERATIVE PROCEDURES:  Median sternotomy, extracorporeal circulation,   . Colonic polyp 2002, 2005, 2009   [name]  . Dermatophytosis of nail   . Diverticulosis   . Dysmetabolic syndrome   . GERD (gastroesophageal reflux disease)    S/P dilation  2005  . Gout   . Hyperlipidemia   . Hypertension   . Hypothyroidism   . Murmur   . Nephrolithiasis    X 1  . Pneumonia age 30   treated as inpatient  . Sleep apnea    on CPAP; Dr [name]    Past Surgical History:  Procedure Laterality Date  . athroscopy     right knee X2, left knee X1  . COLONOSCOPY  2002 & 2009 &2014   polypectomy; [name]  . CORONARY ARTERY BYPASS GRAFT  2010   Median sternotomy, extracorporeal circulation, coronary bypass graft surgery x3 using a left internal mammary artery graft to left anterior descending coronary artery, with a sequential  saphenous vein graft to posterior descending and posterolateral branches  of the right coronary artery.  Endoscopic vein harvesting from the right  . Nail Alvusion Bilateral    Hallux  . NOSE SURGERY  11/20/12   L max antrectomy;septoplasty;turbinate reduction. [name]  . REPLACEMENT TOTAL KNEE     right 2003  . REPLACEMENT TOTAL KNEE     partial Left March 2009  . TENDON RELEASE Right 03/2016   from R hip. @ [name]  . TOTAL HIP ARTHROPLASTY  02/22/2014   right hip; Dr [facility]    Social History   Social History  . Marital status: Married    Spouse name: N/A  . Number of children: 2  . Years of education: N/A   Occupational History  . retired- tech sales  [place] History Main Topics  . Smoking status: Former Smoker    Packs/day: 1.00    Years: 6.00    Types: Cigarettes    Quit date: 10/28/1966  . Smokeless tobacco: Never Used     Comment: smoked [place], up to < 1 ppd  . Alcohol use 1.8 oz/week    3 Glasses of wine per week     Comment: socially  . Drug use: No  . Sexual activity: Not on file   Other Topics Concern  . Not on file   Social History Narrative   Socially; lives with spouse   Children 2; dtr lives here and son lives [place]   3 grandchildren; 19, 56, 3.5        Medication List       Accurate as of 09/03/16  5:51 PM. Always use your most recent med list.          aspirin 325 MG tablet Take 325 mg by mouth daily.   CoQ10 200 MG Caps Take by mouth.  hydrochlorothiazide 25 MG tablet Commonly known as:  HYDRODIURIL Take 1 tablet (25 mg total) by mouth daily.   levothyroxine 50 MCG tablet Commonly known as:  SYNTHROID, LEVOTHROID Take 1 tablet (50 mcg total) by mouth daily before breakfast.   losartan 100 MG tablet Commonly known as:  COZAAR Take 1 tablet (100 mg total) by mouth daily.   Magnesium 400 MG Caps Take 400 mg by mouth daily.   metoprolol tartrate 25 MG tablet Commonly known as:  LOPRESSOR Take 0.5 tablets (12.5 mg total) by mouth daily.   OMEGA 3 500 PO Take 500 mg by mouth daily.   omeprazole 20 MG capsule Commonly known as:  PRILOSEC Take 1 capsule (20 mg total) by mouth daily.   CENTRUM SILVER PO Take 1 tablet by mouth daily.   PRESERVISION AREDS PO Take 2 capsules by mouth daily.   rosuvastatin 20 MG tablet Commonly known as:  CRESTOR Take 1 tablet (20 mg total) by mouth daily.          Objective:   Physical Exam BP  132/68 (BP Location: Left Arm, Patient Position: Sitting, Cuff Size: Normal)   Pulse (!) 58   Temp 97.7 F (36.5 C) (Oral)   Resp 14   Ht 6' (1.829 m)   Wt 297 lb 2 oz (134.8 kg)   SpO2 96%   BMI 40.30 kg/m  General:   Well developed, well nourished . NAD.  HEENT:  Normocephalic . Face symmetric, atraumatic Skin (see picture):  Left pretibial area: Hyperpigmented. Right pretibial area: At the anterior medial aspect has a patch of hyperpigmentation and very subtle swelling, no warm, not really tender to palpation. MSK:  Good pedal pulses bilaterally and normal capillary refill. Right calf is approximately 1 cm larger in circumference but at this time is soft, minimal "tightness". Neurologic:  alert & oriented X3.  Speech normal, gait appropriate for age and unassisted Psych--  Cognition and judgment appear intact.  Cooperative with normal attention span and concentration.  Behavior appropriate. No anxious or depressed appearing.           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 Will check a CBC and sedimentation rate. Leg swelling: Ultrasound was negative for DVT, leg does not feel as "tight" but  is still slightly larger. Recommend leg elevation and compression stockings Dermatitis: likely postinflammatory hyperpigmentation in combination with recent injury and venous insufficiency are causing this problem. Recommend leg elevation, Aveeno, hydrocortisone. If not better will call for dermatology referral. RTC approximately 10-2016 for routine follow-up.

## 2016-09-03 NOTE — Assessment & Plan Note (Signed)
see previous note Cellulitis: Doubt he has an active infection, no fever chills. Will check a CBC and sedimentation rate. Leg swelling: Ultrasound was negative for DVT, leg does not feel as "tight" but  is still slightly larger. Recommend leg elevation and compression stockings Dermatitis: likely postinflammatory hyperpigmentation in combination with recent injury and venous insufficiency are causing this problem. Recommend leg elevation, Aveeno, hydrocortisone. If not better will call for dermatology referral. RTC approximately 10-2016 for routine follow-up.

## 2016-09-11 ENCOUNTER — Ambulatory Visit (INDEPENDENT_AMBULATORY_CARE_PROVIDER_SITE_OTHER): Payer: Medicare HMO | Admitting: Medical

## 2016-09-11 ENCOUNTER — Encounter: Payer: Self-pay | Admitting: Medical

## 2016-09-11 VITALS — BP 126/76 | HR 71 | Temp 98.1°F | Ht 72.0 in | Wt 297.8 lb

## 2016-09-11 DIAGNOSIS — J069 Acute upper respiratory infection, unspecified: Secondary | ICD-10-CM

## 2016-09-11 DIAGNOSIS — H6690 Otitis media, unspecified, unspecified ear: Secondary | ICD-10-CM | POA: Diagnosis not present

## 2016-09-11 MED ORDER — CEFTRIAXONE SODIUM 1 G IJ SOLR
1.0000 g | Freq: Once | INTRAMUSCULAR | Status: AC
  Administered 2016-09-11: 1 g via INTRAMUSCULAR

## 2016-09-11 MED ORDER — AMOXICILLIN-POT CLAVULANATE 875-125 MG PO TABS: 1.0000 | ORAL_TABLET | Freq: Two times a day (BID) | ORAL | 0 refills | Status: DC

## 2016-09-11 MED ORDER — BENZONATATE 100 MG PO CAPS: 100.0000 mg | ORAL_CAPSULE | Freq: Three times a day (TID) | ORAL | 0 refills | Status: DC | PRN

## 2016-09-11 MED ORDER — FLUTICASONE PROPIONATE 50 MCG/ACT NA SUSP: 2.0000 | Freq: Every day | NASAL | 1 refills | Status: DC

## 2016-09-11 NOTE — Progress Notes (Signed)
Pre visit review using our clinic review tool, if applicable. No additional management support is needed unless otherwise documented below in the visit note. 

## 2016-09-11 NOTE — Progress Notes (Signed)
   Subjective:    Patient ID: Steven Munoz, male    DOB: 02-22-1941, 75 y.o.   MRN: [id number]  HPI   Pt in for some recent pnd on Sunday. Then Sunday night some nasal congestion. Pt tried mucinex otc since then. Some mild occasional cough. On Tuesday thick yellow brown mucous when blows his nose.   Today in early am left ear ache. Moderate left ear pain.  Pt about to leave town to travel to [place].   Review of Systems  Constitutional: Negative for chills, fatigue and fever.  HENT: Positive for congestion and ear pain. Negative for postnasal drip, rhinorrhea, sinus pain, sinus pressure, sneezing, sore throat and voice change.   Respiratory: Positive for cough. Negative for shortness of breath and wheezing.   Cardiovascular: Negative for chest pain and palpitations.  Gastrointestinal: Negative for abdominal pain.       Objective:   Physical Exam  General  Mental Status - Alert. General Appearance - Well groomed. Not in acute distress.  Skin Rashes- No Rashes.  HEENT Head- Normal. Ear Auditory Canal - Left- entire left tm bright red. Right - Normal.Tympanic Membrane- Left- normal. Right- Normal. Eye Sclera/Conjunctiva- Left- Normal. Right- Normal. Nose & Sinuses Nasal Mucosa- Left-  Boggy and Congested. Right-  Boggy and  Congested.Bilateral no  maxillary and  No frontal sinus pressure. Mouth & Throat Lips: Upper Lip- Normal: no dryness, cracking, pallor, cyanosis, or vesicular eruption. Lower Lip-Normal: no dryness, cracking, pallor, cyanosis or vesicular eruption. Buccal Mucosa- Bilateral- No Aphthous ulcers. Oropharynx- No Discharge or Erythema. Tonsils: Characteristics- Bilateral- No Erythema or Congestion. Size/Enlargement- Bilateral- No enlargement. Discharge- bilateral-None.  Neck Neck- Supple. No Masses.   Chest and Lung Exam Auscultation: Breath Sounds:-Clear even and unlabored.  Cardiovascular Auscultation:Rythm- Regular, rate and rhythm. Murmurs &  Other Heart Sounds:Ausculatation of the heart reveal- No Murmurs.  Lymphatic Head & Neck General Head & Neck Lymphatics: Bilateral: Description- No Localized lymphadenopathy.       Assessment & Plan:  You appear to have recent uri with Lt OM.  For nasal congestion will rx flonase. For cough will rx benzonatate.  For OM will rx augmentin antibiotic. If you worsen before you leave town by tomorrow then let us know and could give rocephin IM injection. But not indicated presently.  Follow up in 7 days or as needed  After further discussion pt wants to go ahead and get rocephin 1 gram im.

## 2016-09-11 NOTE — Patient Instructions (Addendum)
You appear to have recent uri with Lt OM.  For nasal congestion will rx flonase. For cough will rx benzonatate.  For OM will rx augmentin antibiotic. If you worsen before you leave town by tomorrow then let us know and could give rocephin IM injection. But not indicated presently.  Follow up in 7 days or as needed  After further discussion pt wants to go ahead and get rocephin 1 gram im.

## 2016-09-19 ENCOUNTER — Encounter (HOSPITAL_COMMUNITY): Payer: Self-pay | Admitting: Emergency Medicine

## 2016-09-19 ENCOUNTER — Emergency Department (HOSPITAL_COMMUNITY)
Admission: EM | Admit: 2016-09-19 | Discharge: 2016-09-19 | Disposition: A | Discharge status: Home / Self Care | Payer: Medicare HMO | Attending: Emergency Medicine | Admitting: Emergency Medicine

## 2016-09-19 DIAGNOSIS — R29810 Facial weakness: Secondary | ICD-10-CM | POA: Diagnosis present

## 2016-09-19 DIAGNOSIS — Z87891 Personal history of nicotine dependence: Secondary | ICD-10-CM | POA: Diagnosis not present

## 2016-09-19 DIAGNOSIS — E039 Hypothyroidism, unspecified: Secondary | ICD-10-CM | POA: Diagnosis not present

## 2016-09-19 DIAGNOSIS — I2510 Atherosclerotic heart disease of native coronary artery without angina pectoris: Secondary | ICD-10-CM | POA: Insufficient documentation

## 2016-09-19 DIAGNOSIS — Z955 Presence of coronary angioplasty implant and graft: Secondary | ICD-10-CM | POA: Diagnosis not present

## 2016-09-19 DIAGNOSIS — Z96641 Presence of right artificial hip joint: Secondary | ICD-10-CM | POA: Insufficient documentation

## 2016-09-19 DIAGNOSIS — H6692 Otitis media, unspecified, left ear: Secondary | ICD-10-CM | POA: Insufficient documentation

## 2016-09-19 DIAGNOSIS — I10 Essential (primary) hypertension: Secondary | ICD-10-CM | POA: Diagnosis not present

## 2016-09-19 DIAGNOSIS — Z96651 Presence of right artificial knee joint: Secondary | ICD-10-CM | POA: Diagnosis not present

## 2016-09-19 DIAGNOSIS — Z79899 Other long term (current) drug therapy: Secondary | ICD-10-CM | POA: Insufficient documentation

## 2016-09-19 DIAGNOSIS — Z7982 Long term (current) use of aspirin: Secondary | ICD-10-CM | POA: Insufficient documentation

## 2016-09-19 DIAGNOSIS — G510 Bell's palsy: Secondary | ICD-10-CM | POA: Insufficient documentation

## 2016-09-19 MED ORDER — VALACYCLOVIR HCL 500 MG PO TABS: 500.0000 mg | ORAL_TABLET | Freq: Two times a day (BID) | ORAL | 0 refills | Status: DC

## 2016-09-19 MED ORDER — PREDNISONE 10 MG (21) PO TBPK: ORAL_TABLET | ORAL | 0 refills | Status: DC

## 2016-09-19 MED ORDER — OFLOXACIN 0.3 % OP SOLN
5.0000 [drp] | Freq: Every day | OPHTHALMIC | Status: DC
  Administered 2016-09-19: 5 [drp] via OTIC
  Filled 2016-09-19: qty 5

## 2016-09-19 NOTE — ED Triage Notes (Signed)
Pt presents to ED after being diagnosed with an ear infection.  Pt sts he had "normal cold symptoms" x 1 week when ear pain started.  Pt was treated with IM Rocephin and Augmentin at PCP, currently has 2 pills left.  Pt developed left sided facial droop x 24 hours, no other deficits.  Pt has hx of Bells Palsy and states it is similar.  EDP to come see Pt at triage

## 2016-09-19 NOTE — ED Provider Notes (Signed)
Medical screening examination/treatment/procedure(s) were conducted as a shared visit with non-physician practitioner(s) and myself.  I personally evaluated the patient during the encounter. Briefly, the patient is a 75 y.o. male several weeks of left otalgia and new onset left facial weakness. Exam without evidence of TM perforation. Patient did have dry blood in the external ear canal on the left. No effusion noted. No vesicular rash noted. No mastoid tenderness. Patient does have complete paralysis of the left face consistent with Bell's palsy. We'll have patient follow-up with ENT as needed for otalgia. We'll provide patient with steroids and acyclovir for Bell's palsy. Specific ocular care was given.  The patient is safe for discharge with strict return precautions.    EKG Interpretation None           Fatima Blank, MD 09/20/16 [phone]

## 2016-09-19 NOTE — ED Provider Notes (Signed)
[facility] DEPT Provider Note   CSN: [id number] Arrival date & time: 09/19/16  1906   By signing my name below, I, Delton Prairie, attest that this documentation has been prepared under the direction and in the presence of  Debroah Baller, NP. Electronically Signed: Delton Prairie, ED Scribe. 09/19/16. 8:27 PM.   History   Chief Complaint Chief Complaint  Patient presents with  . Facial Droop  . Otitis Media   The history is provided by the patient. No language interpreter was used.  Otalgia  This is a new problem. The current episode started more than 1 week ago. There is pain in the left ear. The problem occurs constantly. The problem has not changed since onset.There has been no fever. The pain is moderate. Associated symptoms include ear discharge.   HPI Comments:  Steven Munoz is a 75 y.o. male, with a hx of Bells Palsy, who presents to the Emergency Department complaining of sudden onset facial droop x yesterday. Pt states his symptoms began as an ear pain on Tuesday. Pt states his initial symptoms began as cold-like symptoms and ear pain x 1 week. He notes associated minimal ear drainage with small amount of blood which has since resided, balance problems, and sudden onset numbness to his lip. Pt states he visited his PCP, was told his ear was erythematous and prescribed Augmentin with no relief. Pt has taken Advil with moderate relief. Pt denies fevers, any other associated symptoms and modifying factors at this time.    Past Medical History:  Diagnosis Date  . CAD (coronary artery disease) CABG  05/29/09    OPERATIVE PROCEDURES:  Median sternotomy, extracorporeal circulation,   . Colonic polyp 2002, 2005, 2009   [name]  . Dermatophytosis of nail   . Diverticulosis   . Dysmetabolic syndrome   . GERD (gastroesophageal reflux disease)    S/P dilation  2005  . Gout   . Hyperlipidemia   . Hypertension   . Hypothyroidism   . Murmur   . Nephrolithiasis    X 1  .  Pneumonia age 20   treated as inpatient  . Sleep apnea    on CPAP; Dr [name]    Patient Active Problem List   Diagnosis Date Noted  . Annual physical exam 06/06/2016  . PCP NOTES >>>>>>>>>>>> 02/13/2016  . CAD (coronary artery disease) 02/13/2016  . Pedal edema 01/25/2016  . Obesity (BMI 30-39.9) 06/02/2014  . CTS (carpal tunnel syndrome) 06/01/2014  . Varicose veins 04/07/2014  . Prolonged P-R interval 03/04/2013  . NEPHROLITHIASIS, HX OF 05/18/2010  . ATRIAL FIBRILLATION 06/20/2009  . CORONARY ARTERY BYPASS GRAFT, HX OF 06/20/2009  . PREMATURE VENTRICULAR CONTRACTIONS 05/09/2009  . DYSPNEA 05/09/2009  . MURMUR 05/08/2009  . Essential hypertension 04/10/2009  . DIVERTICULOSIS, COLON 04/10/2009  . G E R D 08/05/2008  . Sleep apnea 08/05/2008  . DERMATOPHYTOSIS OF NAIL 07/29/2008  . Nocturia 05/30/2008  . Hypothyroidism 02/15/2008  . Elevated lipids 02/15/2008  . History of gout 02/15/2008  . DYSMETABOLIC SYNDROME X [id number]  . History of colonic polyps 01/14/2008    Past Surgical History:  Procedure Laterality Date  . athroscopy     right knee X2, left knee X1  . COLONOSCOPY  2002 & 2009 &2014   polypectomy; [name]  . CORONARY ARTERY BYPASS GRAFT  2010   Median sternotomy, extracorporeal circulation, coronary bypass graft surgery x3 using a left internal mammary artery graft to left anterior descending coronary artery, with a sequential  saphenous vein graft to posterior descending and posterolateral branches  of the right coronary artery.  Endoscopic vein harvesting from the right  . Nail Alvusion Bilateral    Hallux  . NOSE SURGERY  11/20/12   L max antrectomy;septoplasty;turbinate reduction. [name]  . REPLACEMENT TOTAL KNEE     right 2003  . REPLACEMENT TOTAL KNEE     partial Left March 2009  . TENDON RELEASE Right 03/2016   from R hip. @ [name]  . TOTAL HIP ARTHROPLASTY  02/22/2014   right hip; [facility]    Home Medications    Prior  to Admission medications   Medication Sig Start Date End Date Taking? Authorizing Provider  amoxicillin-clavulanate (AUGMENTIN) 875-125 MG tablet Take 1 tablet by mouth 2 (two) times daily. 09/20/16   [place], NP  aspirin 325 MG tablet Take 325 mg by mouth daily.      Historical Provider, MD  benzonatate (TESSALON) 100 MG capsule Take 1 capsule (100 mg total) by mouth 3 (three) times daily as needed for cough. 09/11/16   Percell Miller Saguier, PA-C  Coenzyme Q10 (COQ10) 200 MG CAPS Take by mouth.    Historical Provider, MD  fluticasone (FLONASE) 50 MCG/ACT nasal spray Place 2 sprays into both nostrils daily. 09/11/16   Percell Miller Saguier, PA-C  hydrochlorothiazide (HYDRODIURIL) 25 MG tablet Take 1 tablet (25 mg total) by mouth daily. 06/18/16   Colon Branch, MD  levothyroxine (SYNTHROID, LEVOTHROID) 50 MCG tablet Take 1 tablet (50 mcg total) by mouth daily before breakfast. 06/18/16   Colon Branch, MD  losartan (COZAAR) 100 MG tablet Take 1 tablet (100 mg total) by mouth daily. 06/18/16   Colon Branch, MD  Magnesium 400 MG CAPS Take 400 mg by mouth daily.    Historical Provider, MD  metoprolol tartrate (LOPRESSOR) 25 MG tablet Take 0.5 tablets (12.5 mg total) by mouth daily. 06/18/16   Colon Branch, MD  Multiple Vitamins-Minerals (CENTRUM SILVER PO) Take 1 tablet by mouth daily.    Historical Provider, MD  Multiple Vitamins-Minerals (PRESERVISION AREDS PO) Take 2 capsules by mouth daily.    Historical Provider, MD  Omega-3 Fatty Acids (OMEGA 3 500 PO) Take 500 mg by mouth daily.    Historical Provider, MD  omeprazole (PRILOSEC) 20 MG capsule Take 1 capsule (20 mg total) by mouth daily. 04/03/16   Colon Branch, MD  predniSONE (STERAPRED UNI-PAK 21 TAB) 10 MG (21) TBPK tablet Take 6 tablets daily x 4 days then 5 tablets x 1 day, then 4, 3, 2, 1 09/19/16   Hope Bunnie Pion, NP  rosuvastatin (CRESTOR) 20 MG tablet Take 1 tablet (20 mg total) by mouth daily. 06/18/16   Colon Branch, MD  valACYclovir (VALTREX) 500 MG tablet Take 1  tablet (500 mg total) by mouth 2 (two) times daily. 09/19/16   Hope Bunnie Pion, NP    Family History Family History  Problem Relation Age of Onset  . Hypertension Mother   . Transient ischemic attack Mother   . Lung cancer Mother     liver cancer, smoker, TIA  . Heart attack Father 43  . Heart attack Brother 33    smoker  . Diabetes Neg Hx   . Colon cancer Neg Hx   . Prostate cancer Neg Hx     Social History Social History  Substance Use Topics  . Smoking status: Former Smoker    Packs/day: 1.00    Years: 6.00  Types: Cigarettes    Quit date: 10/28/1966  . Smokeless tobacco: Never Used     Comment: smoked [place], up to < 1 ppd  . Alcohol use 1.8 oz/week    3 Glasses of wine per week     Comment: socially     Allergies   Povidone-iodine; Ofloxacin; and Tape   Review of Systems Review of Systems  Constitutional: Negative for fever.  HENT: Positive for ear discharge and ear pain.   Neurological: Positive for facial asymmetry and numbness.  all other systems negative   Physical Exam Updated Vital Signs BP 147/62   Pulse 62   Temp 98.1 F (36.7 C) (Oral)   Resp 18   Ht 6' (1.829 m)   Wt 134.7 kg   SpO2 96%   BMI 40.28 kg/m   Physical Exam  Constitutional: He is oriented to person, place, and time. He appears well-developed and well-nourished. No distress.  HENT:  Head: Normocephalic and atraumatic.  Left ear: Dried blood in ear canal.   Eyes: Conjunctivae are normal.  Cardiovascular: Normal rate.   Pulses:      Radial pulses are 2+ on the right side, and 2+ on the left side.  Pulmonary/Chest: Effort normal.  Abdominal: He exhibits no distension.  Neurological: He is alert and oriented to person, place, and time.  Facial droop on left. Inability to completely close left eye. Grips equal.   Skin: Skin is warm and dry.  Psychiatric: He has a normal mood and affect.  Nursing note and vitals reviewed.    ED Treatments / Results  DIAGNOSTIC  STUDIES:  Oxygen Saturation is 96% on RA, normal by my interpretation.    COORDINATION OF CARE:  8:24 PM Discussed treatment plan with pt at bedside and pt agreed to plan. Pt was later evaluated by Dr. Leonette Monarch who then discussed treatment plan and appropriate medication with pt.   Labs (all labs ordered are listed, but only abnormal results are displayed) Labs Reviewed - No data to display  Radiology No results found.  Procedures Procedures (including critical care time)  Medications Ordered in ED Medications - No data to display   Initial Impression / Assessment and Plan / ED Course  I have reviewed the triage vital signs and the nursing notes.  Dr. Leonette Monarch in to examine the patient and discuss plan of care. Patient to f/u with ENT.  Clinical Course   76 y.o. male with left ear pain and facial droop stable for d/c without concern for CVA at this time. Will start steroids and antiviral medications. Discussed eye care with patient.    Final Clinical Impressions(s) / ED Diagnoses   Final diagnoses:  Bell's palsy  Otitis media of left ear follow-up, not resolved    New Prescriptions Discharge Medication List as of 09/19/2016 10:57 PM    START taking these medications   Details  predniSONE (STERAPRED UNI-PAK 21 TAB) 10 MG (21) TBPK tablet Take 6 tablets daily x 4 days then 5 tablets x 1 day, then 4, 3, 2, 1, Print    valACYclovir (VALTREX) 500 MG tablet Take 1 tablet (500 mg total) by mouth 2 (two) times daily., Starting Thu 09/19/2016, Print      I personally performed the services described in this documentation, which was scribed in my presence. The recorded information has been reviewed and is accurate.     [place], NP 09/26/16 [place], MD 09/27/16 [phone]

## 2016-09-19 NOTE — ED Notes (Signed)
Pt understood dc material. NAD noted. Scripts given at dc 

## 2016-09-20 ENCOUNTER — Emergency Department (HOSPITAL_COMMUNITY)
Admission: EM | Admit: 2016-09-20 | Discharge: 2016-09-20 | Disposition: A | Discharge status: Home / Self Care | Payer: Medicare HMO | Attending: Otolaryngology | Admitting: Otolaryngology

## 2016-09-20 ENCOUNTER — Encounter (HOSPITAL_COMMUNITY): Payer: Self-pay

## 2016-09-20 DIAGNOSIS — I2510 Atherosclerotic heart disease of native coronary artery without angina pectoris: Secondary | ICD-10-CM | POA: Diagnosis not present

## 2016-09-20 DIAGNOSIS — Z951 Presence of aortocoronary bypass graft: Secondary | ICD-10-CM | POA: Insufficient documentation

## 2016-09-20 DIAGNOSIS — H9202 Otalgia, left ear: Secondary | ICD-10-CM | POA: Diagnosis present

## 2016-09-20 DIAGNOSIS — I10 Essential (primary) hypertension: Secondary | ICD-10-CM | POA: Insufficient documentation

## 2016-09-20 DIAGNOSIS — Z96653 Presence of artificial knee joint, bilateral: Secondary | ICD-10-CM | POA: Insufficient documentation

## 2016-09-20 DIAGNOSIS — Z96641 Presence of right artificial hip joint: Secondary | ICD-10-CM | POA: Insufficient documentation

## 2016-09-20 DIAGNOSIS — H6502 Acute serous otitis media, left ear: Secondary | ICD-10-CM | POA: Diagnosis not present

## 2016-09-20 DIAGNOSIS — E039 Hypothyroidism, unspecified: Secondary | ICD-10-CM | POA: Diagnosis not present

## 2016-09-20 DIAGNOSIS — G510 Bell's palsy: Secondary | ICD-10-CM | POA: Diagnosis not present

## 2016-09-20 MED ORDER — AMOXICILLIN-POT CLAVULANATE 875-125 MG PO TABS: 1.0000 | ORAL_TABLET | Freq: Two times a day (BID) | ORAL | 0 refills | Status: DC

## 2016-09-20 NOTE — ED Triage Notes (Signed)
Pt is here to be seen by Dr. Erik Obey. He has some pain behind his left ear. Pt also states he has bells palsy.

## 2016-09-20 NOTE — ED Provider Notes (Signed)
MSE was initiated and I personally evaluated the patient and placed orders (if any) at  4:48 PM on September 20, 2016.  Steven Munoz is a 75 y.o. male who presents to the ED for evaluation by ENT. Patient was here last night and seen by me and Dr. Leonette Monarch and referred to Dr. Erik Obey. Patient spoke with DR. [facility] today and he is meeting the patient here for evaluation.   The patient appears stable so that the remainder of the MSE may be completed by another provider.   [place], NP 09/20/16 1649    Leo Grosser, MD 09/21/16 [phone]

## 2016-09-20 NOTE — Consult Note (Signed)
Hughey, Strehle 75 y.o., male [id number]     Chief Complaint: LEFT muffled hearing and facial weakness  HPI: 75 yo wm, onset URI symptoms including LEFT ear pain and muffling 9 days ago.  Seen by PA, dx OM, given Augmentin. tol well. One more day left.  3 days ago felt numbness in LEFT upper lip, followed by LEFT facial weakness which progressed.  Seen yesterday in ED, dx Bell's palsy, given po Pred, Valcyclovir, and Ofloxacin drops after developing bloody drainage AS.  Drainage stopped after about 1 day.   Some imbalance with ambulating, but was able to drive high speed sports cars last weekend.  Pt and wife both feel the face may be slightly better already today.  Some mild mastoid area pain.    Had prior episode, he believes LEFT, 50+ years ago with rapid resolution.    PMH: Past Medical History:  Diagnosis Date  . CAD (coronary artery disease) CABG  05/29/09    OPERATIVE PROCEDURES:  Median sternotomy, extracorporeal circulation,   . Colonic polyp 2002, 2005, 2009   [name]  . Dermatophytosis of nail   . Diverticulosis   . Dysmetabolic syndrome   . GERD (gastroesophageal reflux disease)    S/P dilation  2005  . Gout   . Hyperlipidemia   . Hypertension   . Hypothyroidism   . Murmur   . Nephrolithiasis    X 1  . Pneumonia age 73   treated as inpatient  . Sleep apnea    on CPAP; Dr [name]    Surg Hx: Past Surgical History:  Procedure Laterality Date  . athroscopy     right knee X2, left knee X1  . COLONOSCOPY  2002 & 2009 &2014   polypectomy; [name]  . CORONARY ARTERY BYPASS GRAFT  2010   Median sternotomy, extracorporeal circulation, coronary bypass graft surgery x3 using a left internal mammary artery graft to left anterior descending coronary artery, with a sequential  saphenous vein graft to posterior descending and posterolateral branches  of the right coronary artery.  Endoscopic vein harvesting from the right  . Nail Alvusion Bilateral    Hallux  . NOSE SURGERY   11/20/12   L max antrectomy;septoplasty;turbinate reduction. [name]  . REPLACEMENT TOTAL KNEE     right 2003  . REPLACEMENT TOTAL KNEE     partial Left March 2009  . TENDON RELEASE Right 03/2016   from R hip. @ [name]  . TOTAL HIP ARTHROPLASTY  02/22/2014   right hip; Dr [facility]    FHx:   Family History  Problem Relation Age of Onset  . Hypertension Mother   . Transient ischemic attack Mother   . Lung cancer Mother     liver cancer, smoker, TIA  . Heart attack Father 18  . Heart attack Brother 3    smoker  . Diabetes Neg Hx   . Colon cancer Neg Hx   . Prostate cancer Neg Hx    SocHx:  reports that he quit smoking about 49 years ago. His smoking use included Cigarettes. He has a 6.00 pack-year smoking history. He has never used smokeless tobacco. He reports that he drinks about 1.8 oz of alcohol per week . He reports that he does not use drugs.  ALLERGIES:  Allergies  Allergen Reactions  . Povidone-Iodine     REACTION: rash locally  . Ofloxacin     Affected sleep with eye drop formulation  . Tape Rash    Cloth  adhesive tape-per patient     (Not in a hospital admission)  No results found for this or any previous visit (from the past 48 hour(s)). No results found.  ROS: no HA.  Some tearing and photophobia  Blood pressure 170/84, pulse 74, temperature 97.8 F (36.6 C), temperature source Oral, resp. rate 18, SpO2 98 %.  PHYSICAL EXAM: Overall appearance: alert and cheerful.  Heavy set.  Mental status sharp.   Head:  NCAT Ears:AD clear, AS with small amt dried blood along canal down to TM.  TM sl dark c/w fluid, but no bulging or erythema.  No vesicles on TM or in canal or pinna.  No swelling, erythema, tenderness over mastoid.  512 Hz tuning fork on upper teeth lateralizes to LEFT. Nose:clear Oral Cavity: clear. No vesicles on palate or throat.   Oral Pharynx/Hypopharynx/Larynx:  See above Neuro: CN intact except weak LEFT face.  No motion of  forehead, sl motion with good forced closure OS, incomplete passive closure OS with partial Bell's phenomenon. Motion of LEFT corner of mouth. 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  Acute LEFT facial palsy, incomplete.    Plan:  Reassured pt that as long as there remains some volitional movement, the prognosis is excellent.  Does not need myringotomy for drainage.  Continue Pred and Valcyclovir.  Will add 5 more days of augmentin.  Needs to tape eye shut during sleep until there is good passive closure.  Artificial tears and ophth ointment for comfort.  Avoid windy or dusty activities.  Recheck my office 4 days.  Jodi Marble [id number], 5:33 PM

## 2016-09-20 NOTE — ED Triage Notes (Signed)
Dr Erik Obey has been called  He is coming

## 2016-09-20 NOTE — ED Notes (Signed)
See providers assessment.  

## 2016-09-21 DIAGNOSIS — G510 Bell's palsy: Secondary | ICD-10-CM | POA: Diagnosis not present

## 2016-09-24 DIAGNOSIS — G510 Bell's palsy: Secondary | ICD-10-CM | POA: Diagnosis not present

## 2016-10-02 DIAGNOSIS — H6522 Chronic serous otitis media, left ear: Secondary | ICD-10-CM | POA: Diagnosis not present

## 2016-10-02 DIAGNOSIS — G510 Bell's palsy: Secondary | ICD-10-CM | POA: Diagnosis not present

## 2016-10-29 DIAGNOSIS — H918X2 Other specified hearing loss, left ear: Secondary | ICD-10-CM | POA: Diagnosis not present

## 2016-10-29 DIAGNOSIS — G510 Bell's palsy: Secondary | ICD-10-CM | POA: Diagnosis not present

## 2016-10-29 DIAGNOSIS — H903 Sensorineural hearing loss, bilateral: Secondary | ICD-10-CM | POA: Diagnosis not present

## 2016-10-29 DIAGNOSIS — H6522 Chronic serous otitis media, left ear: Secondary | ICD-10-CM | POA: Diagnosis not present

## 2016-11-07 DIAGNOSIS — H16212 Exposure keratoconjunctivitis, left eye: Secondary | ICD-10-CM | POA: Diagnosis not present

## 2016-11-07 DIAGNOSIS — H25813 Combined forms of age-related cataract, bilateral: Secondary | ICD-10-CM | POA: Diagnosis not present

## 2016-11-07 DIAGNOSIS — G510 Bell's palsy: Secondary | ICD-10-CM | POA: Diagnosis not present

## 2016-11-07 DIAGNOSIS — H524 Presbyopia: Secondary | ICD-10-CM | POA: Diagnosis not present

## 2016-11-13 ENCOUNTER — Other Ambulatory Visit: Payer: Self-pay | Admitting: Medical

## 2016-11-15 NOTE — Progress Notes (Signed)
Patient ID: Steven Munoz, male   DOB: 06/27/1941, 76 y.o.   MRN: [id number]   Steven Munoz is S/P CABG with periop afib in 05/2009 . Marland Kitchen He has good LV function. . There has been no SSCP, palpitations, PND, orthopnea, and only trace edema . He is compliant with his meds for BP and cholesterol. His BP is borderline and he had LE edema. HCTZ was added to his meds in March 2012 . He has had improvement in his BP and edema. He has to stick with a low carb diet as his weight is up again Right hip arthritis sees Steven Munoz Has had two cortisone injections. BP a bit high and HR a bit low with first degree AV block   8/14 LDL 84 with normal LFTls   Wearing his CPAP followed by Steven Munoz   Had right hip surgery at [facility] with Dr Steven Munoz [address] [place] op hip dislocated and has not been quite right  Has pain RLE with known varicosities Only had ASA post op no anticoagulation  Duplex LE 6/15 no DVT  No chest pain  Some urinary frequency not helped by cialis   CABG note from Dr Steven Munoz office reviewed Surgery for ostial LAD/distal RCA and ostial PDA Lesions. EF normal Circumflex not grafted   LIMA to LAD SVG sequential to PDA/PLB  This winter had Bells Palsy and hearing issues seeing Steven Munoz   ROS: Denies fever, malais, weight loss, blurry vision, decreased visual acuity, cough, sputum, SOB, hemoptysis, pleuritic pain, palpitaitons, heartburn, abdominal pain, melena, lower extremity edema, claudication, or rash.  All other systems reviewed and negative  General: Affect appropriate Overweight white male  HEENT: normal Neck supple with no adenopathy JVP normal no bruits no thyromegaly Lungs clear with no wheezing and good diaphragmatic motion Heart:  S1/S2 SEM murmur, no rub, gallop or click PMI normal Abdomen: benighn, BS positve, no tenderness, no AAA no bruit.  No HSM or HJR Distal pulses intact with no bruits No edema Neuro non-focal Skin warm and dry No muscular weakness   Current  Outpatient Prescriptions  Medication Sig Dispense Refill  . aspirin 325 MG tablet Take 325 mg by mouth daily.      . benzonatate (TESSALON) 100 MG capsule Take 1 capsule (100 mg total) by mouth 3 (three) times daily as needed for cough. 21 capsule 0  . Coenzyme Q10 (COQ10) 200 MG CAPS Take by mouth.    . fluticasone (FLONASE) 50 MCG/ACT nasal spray SHAKE LIQUID AND USE 2 SPRAYS IN EACH NOSTRIL DAILY 16 g 0  . hydrochlorothiazide (HYDRODIURIL) 25 MG tablet Take 1 tablet (25 mg total) by mouth daily. 90 tablet 2  . levothyroxine (SYNTHROID, LEVOTHROID) 50 MCG tablet Take 1 tablet (50 mcg total) by mouth daily before breakfast. 90 tablet 2  . losartan (COZAAR) 100 MG tablet Take 1 tablet (100 mg total) by mouth daily. 90 tablet 2  . Magnesium 400 MG CAPS Take 400 mg by mouth daily.    . metoprolol tartrate (LOPRESSOR) 25 MG tablet Take 0.5 tablets (12.5 mg total) by mouth daily. 45 tablet 2  . Multiple Vitamins-Minerals (CENTRUM SILVER PO) Take 1 tablet by mouth daily.    . Multiple Vitamins-Minerals (PRESERVISION AREDS PO) Take 2 capsules by mouth daily.    . Omega-3 Fatty Acids (OMEGA 3 500 PO) Take 500 mg by mouth daily.    Marland Kitchen omeprazole (PRILOSEC) 20 MG capsule Take 1 capsule (20 mg total) by mouth daily. 90 capsule  3  . rosuvastatin (CRESTOR) 20 MG tablet Take 1 tablet (20 mg total) by mouth daily. 90 tablet 2   No current facility-administered medications for this visit.     Allergies  Povidone-iodine; Ofloxacin; and Tape  Electrocardiogram:  3/15  SB rate 49 otherwise normal  05/20/16  SR rate 63 PR 210 PVC otherwise normal   Assessment and Plan  Murmur:  F/u echo in a year AV sclerosis murmur   CAD/CABG: 2010 [place] to LAD and SVG to PDA/PLB no angina continue medical Rx   PAF: post op no recurrence ASA and beta blocker  HTN: Well controlled.  Continue current medications and low sodium Dash type diet.    Thyroid:  Lab Results  Component Value Date   TSH 4.32 05/30/2016     Cholesterol  Lab Results  Component Value Date   LDLCALC 61 06/06/2016    Ortho: post right hip surgery [name]  Post tendon release in June with relief of pain  First Degree: no change on ECG today no high grade AV block   ENT:  F/u [facility] bells palsy resolved still with tinnitus wearing hearing aids   F/U in a year    [employer]

## 2016-11-25 ENCOUNTER — Ambulatory Visit (INDEPENDENT_AMBULATORY_CARE_PROVIDER_SITE_OTHER): Payer: Medicare HMO | Admitting: Cardiovascular Disease

## 2016-11-25 ENCOUNTER — Encounter: Payer: Self-pay | Admitting: Cardiovascular Disease

## 2016-11-25 VITALS — BP 132/60 | HR 56 | Ht 73.0 in | Wt 298.1 lb

## 2016-11-25 DIAGNOSIS — I480 Paroxysmal atrial fibrillation: Secondary | ICD-10-CM

## 2016-11-25 NOTE — Patient Instructions (Addendum)
Medication Instructions:  Your physician recommends that you continue on your current medications as directed. Please refer to the Current Medication list given to you today.  Labwork: NONE  Testing/Procedures: Your physician has requested that you have an echocardiogram in one year with office visit. Echocardiography is a painless test that uses sound waves to create images of your heart. It provides your doctor with information about the size and shape of your heart and how well your heart's chambers and valves are working. This procedure takes approximately one hour. There are no restrictions for this procedure.  Follow-Up: Your physician wants you to follow-up in: 12 months with Dr. Johnsie Cancel. You will receive a reminder letter in the mail two months in advance. If you don't receive a letter, please call our office to schedule the follow-up appointment.   If you need a refill on your cardiac medications before your next appointment, please call your pharmacy.

## 2016-12-10 ENCOUNTER — Ambulatory Visit (INDEPENDENT_AMBULATORY_CARE_PROVIDER_SITE_OTHER): Payer: Medicare HMO | Admitting: Internal Medicine

## 2016-12-10 ENCOUNTER — Encounter: Payer: Self-pay | Admitting: Internal Medicine

## 2016-12-10 VITALS — BP 136/70 | HR 77 | Temp 97.7°F | Resp 14 | Ht 73.0 in | Wt 300.4 lb

## 2016-12-10 DIAGNOSIS — I10 Essential (primary) hypertension: Secondary | ICD-10-CM | POA: Diagnosis not present

## 2016-12-10 DIAGNOSIS — R739 Hyperglycemia, unspecified: Secondary | ICD-10-CM

## 2016-12-10 DIAGNOSIS — E785 Hyperlipidemia, unspecified: Secondary | ICD-10-CM

## 2016-12-10 DIAGNOSIS — E039 Hypothyroidism, unspecified: Secondary | ICD-10-CM | POA: Diagnosis not present

## 2016-12-10 LAB — HEMOGLOBIN A1C: Hgb A1c MFr Bld: 5.6 % (ref 4.6–6.5)

## 2016-12-10 LAB — BASIC METABOLIC PANEL
BUN: 15 mg/dL (ref 6–23)
CO2: 30 meq/L (ref 19–32)
Calcium: 9.6 mg/dL (ref 8.4–10.5)
Chloride: 103 mEq/L (ref 96–112)
Creatinine, Ser: 0.79 mg/dL (ref 0.40–1.50)
GFR: 101.35 mL/min (ref >60.00)
GLUCOSE: 99 mg/dL (ref 70–99)
POTASSIUM: 3.6 meq/L (ref 3.5–5.1)
SODIUM: 141 meq/L (ref 135–145)

## 2016-12-10 LAB — TSH: TSH: 2.05 u[IU]/mL (ref 0.35–4.50)

## 2016-12-10 NOTE — Progress Notes (Signed)
Subjective:    Patient ID: Steven Munoz, male    DOB: 1941-07-27, 75 y.o.   MRN: [id number]  DOS:  12/10/2016 Type of visit - description :  Routine visit Interval history:  Since the last visit, went to the ER, saw ENT, dx w/ L  otitis and  complete L Bell Palsy  Also saw cardiology, felt to be stable, follow-up in one year Hypothyroidism: Good medication compliance HTN: Good medication compliance, ambulatory BPs usually 120/70, 140/70. History of cellulitis, skin continued to be dark, occasionally the skin hurts.  ROS Systems  Denies chest pain, difficulty breathing. No palpitations No nausea, vomiting, diarrhea  Past Medical History:  Diagnosis Date  . CAD (coronary artery disease) CABG  05/29/09    OPERATIVE PROCEDURES:  Median sternotomy, extracorporeal circulation,   . Colonic polyp 2002, 2005, 2009   [name]  . Dermatophytosis of nail   . Diverticulosis   . Dysmetabolic syndrome   . GERD (gastroesophageal reflux disease)    S/P dilation  2005  . Gout   . Hyperlipidemia   . Hypertension   . Hypothyroidism   . Murmur   . Nephrolithiasis    X 1  . Pneumonia age 83   treated as inpatient  . Sleep apnea    on CPAP; Dr [name]    Past Surgical History:  Procedure Laterality Date  . athroscopy     right knee X2, left knee X1  . COLONOSCOPY  2002 & 2009 &2014   polypectomy; [name]  . CORONARY ARTERY BYPASS GRAFT  2010   Median sternotomy, extracorporeal circulation, coronary bypass graft surgery x3 using a left internal mammary artery graft to left anterior descending coronary artery, with a sequential  saphenous vein graft to posterior descending and posterolateral branches  of the right coronary artery.  Endoscopic vein harvesting from the right  . Nail Alvusion Bilateral    Hallux  . NOSE SURGERY  11/20/12   L max antrectomy;septoplasty;turbinate reduction. [name]  . REPLACEMENT TOTAL KNEE     right 2003  . REPLACEMENT TOTAL KNEE     partial Left March 2009  . TENDON RELEASE Right 03/2016   from R hip. @ [name]  . TOTAL HIP ARTHROPLASTY  02/22/2014   right hip; Dr [facility]    Social History   Social History  . Marital status: Married    Spouse name: N/A  . Number of children: 2  . Years of education: N/A   Occupational History  . retired- tech sales  [place] History Main Topics  . Smoking status: Former Smoker    Packs/day: 1.00    Years: 6.00    Types: Cigarettes    Quit date: 10/28/1966  . Smokeless tobacco: Never Used     Comment: smoked [place], up to < 1 ppd  . Alcohol use 1.8 oz/week    3 Glasses of wine per week     Comment: socially  . Drug use: No  . Sexual activity: Not on file   Other Topics Concern  . Not on file   Social History Narrative   Socially; lives with spouse   Children 2; dtr lives here and son lives [place]   3 grandchildren; 56, 54, 3.5      Allergies as of 12/10/2016      Reactions   Povidone-iodine    REACTION: rash locally   Ofloxacin    Affected sleep with eye drop formulation  Tape Rash   Cloth adhesive tape-per patient Cloth adhesive tape-per patient Cloth adhesive tape-per patient      Medication List       Accurate as of 12/10/16 10:03 AM. Always use your most recent med list.          aspirin 325 MG tablet Take 325 mg by mouth daily.   benzonatate 100 MG capsule Commonly known as:  TESSALON Take 1 capsule (100 mg total) by mouth 3 (three) times daily as needed for cough.   CoQ10 200 MG Caps Take by mouth.   fluticasone 50 MCG/ACT nasal spray Commonly known as:  FLONASE SHAKE LIQUID AND USE 2 SPRAYS IN EACH NOSTRIL DAILY   hydrochlorothiazide 25 MG tablet Commonly known as:  HYDRODIURIL Take 1 tablet (25 mg total) by mouth daily.   levothyroxine 50 MCG tablet Commonly known as:  SYNTHROID, LEVOTHROID Take 1 tablet (50 mcg total) by mouth daily before breakfast.   losartan 100 MG tablet Commonly known  as:  COZAAR Take 1 tablet (100 mg total) by mouth daily.   Magnesium 400 MG Caps Take 400 mg by mouth daily.   metoprolol tartrate 25 MG tablet Commonly known as:  LOPRESSOR Take 0.5 tablets (12.5 mg total) by mouth daily.   OMEGA 3 500 PO Take 500 mg by mouth daily.   omeprazole 20 MG capsule Commonly known as:  PRILOSEC Take 1 capsule (20 mg total) by mouth daily.   CENTRUM SILVER PO Take 1 tablet by mouth daily.   PRESERVISION AREDS PO Take 2 capsules by mouth daily.   rosuvastatin 20 MG tablet Commonly known as:  CRESTOR Take 1 tablet (20 mg total) by mouth daily.          Objective:   Physical Exam BP 136/70 (BP Location: Left Arm, Patient Position: Sitting, Cuff Size: Normal)   Pulse 77   Temp 97.7 F (36.5 C) (Oral)   Resp 14   Ht 6\' 1"  (1.854 m)   Wt (!) 300 lb 6 oz (136.2 kg)   SpO2 96%   BMI 39.63 kg/m  General:   Well developed, well nourished . NAD.  HEENT:  Normocephalic . Face symmetric, atraumatic Lungs:  CTA B Normal respiratory effort, no intercostal retractions, no accessory muscle use. Heart: RRR,  no murmur.  +/+++  pretibial edema bilaterally above the sock line . Calves symmetric Skin: & Right more than left pretibial hyperpigmentation. No redness, warmness or TTP.  Neurologic:  alert & oriented X3.  Speech normal, gait appropriate for age and unassisted Psych--  Cognition and judgment appear intact.  Cooperative with normal attention span and concentration.  Behavior appropriate. No anxious or depressed appearing.      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 BP controlled, check a BMP Hyperlipidemia: Controlled per last FLP, continue Crestor Hypothyroidism: On Synthroid, check a TSH. Pretibial hyperpigmentation: Likely postinflammatory, no evidence of active infection, recommend to use Aveeno, compression stockings, low salt diet, leg elevation. History of Bell palsy, left: Resolved except for mild numbness of the lip. RTC CPX 05-2017

## 2016-12-10 NOTE — Progress Notes (Signed)
Pre visit review using our clinic review tool, if applicable. No additional management support is needed unless otherwise documented below in the visit note. 

## 2016-12-10 NOTE — Assessment & Plan Note (Signed)
hyperglycemia: Check an A1c, we had a long discussion about diet, encourage calorie counting HTN: Continue Lopressor, losartan, HCTZ. BP controlled, check a BMP Hyperlipidemia: Controlled per last FLP, continue Crestor Hypothyroidism: On Synthroid, check a TSH. Pretibial hyperpigmentation: Likely postinflammatory, no evidence of active infection, recommend to use Aveeno, compression stockings, low salt diet, leg elevation. History of Bell palsy, left: Resolved except for mild numbness of the lip. RTC CPX 05-2017

## 2016-12-10 NOTE — Patient Instructions (Addendum)
GO TO THE LAB : Get the blood work     GO TO THE FRONT DESK Schedule your next appointment for a  Physical by [phone]

## 2016-12-12 ENCOUNTER — Other Ambulatory Visit: Payer: Self-pay

## 2016-12-12 DIAGNOSIS — I10 Essential (primary) hypertension: Secondary | ICD-10-CM

## 2016-12-12 MED ORDER — LOSARTAN POTASSIUM 100 MG PO TABS: 100.0000 mg | ORAL_TABLET | Freq: Every day | ORAL | 2 refills | Status: DC

## 2016-12-24 DIAGNOSIS — G4733 Obstructive sleep apnea (adult) (pediatric): Secondary | ICD-10-CM | POA: Diagnosis not present

## 2017-01-02 DIAGNOSIS — H918X2 Other specified hearing loss, left ear: Secondary | ICD-10-CM | POA: Diagnosis not present

## 2017-01-02 DIAGNOSIS — H903 Sensorineural hearing loss, bilateral: Secondary | ICD-10-CM | POA: Diagnosis not present

## 2017-01-22 ENCOUNTER — Ambulatory Visit (INDEPENDENT_AMBULATORY_CARE_PROVIDER_SITE_OTHER): Payer: Medicare HMO | Admitting: Podiatry

## 2017-01-22 ENCOUNTER — Encounter: Payer: Self-pay | Admitting: Podiatry

## 2017-01-22 DIAGNOSIS — L600 Ingrowing nail: Secondary | ICD-10-CM

## 2017-01-22 NOTE — Patient Instructions (Signed)

## 2017-01-22 NOTE — Progress Notes (Signed)
Subjective:     Patient ID: Steven Munoz, male   DOB: 1940/11/15, 76 y.o.   MRN: [id number]  HPI patient presents with ingrown toenails of the hallux bilateral stating that he had a removable for most of the toe but the medial sides continue to grow and be painful   Review of Systems  All other systems reviewed and are negative.      Objective:   Physical Exam  Constitutional: He is oriented to person, place, and time.  Cardiovascular: Intact distal pulses.   Musculoskeletal: Normal range of motion.  Neurological: He is oriented to person, place, and time.  Skin: Skin is warm.  Nursing note and vitals reviewed.  Neurovascular status intact muscle strength was adequate with patient having mild vein engorgement in the lower legs due to varicosities. He is found to have incurvated thickened hallux nails bilateral medial side that are painful when pressed and difficulty in wearing shoes due to the growth of these nailbeds. 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 I explained procedure and risk and patient wants surgery and today I infiltrated the hallux bilateral with 60 Milligan second Marcaine mixture removed the medial borders removed all proud flesh exposed matrix and applied phenol for applications 30 seconds to each followed by alcohol lavage and sterile dressing. Gave instructions on soaks and reappoint

## 2017-01-27 ENCOUNTER — Ambulatory Visit: Payer: Medicare HMO | Admitting: Pulmonary Disease

## 2017-01-28 ENCOUNTER — Encounter: Payer: Self-pay | Admitting: Pulmonary Disease

## 2017-01-28 ENCOUNTER — Ambulatory Visit (INDEPENDENT_AMBULATORY_CARE_PROVIDER_SITE_OTHER): Payer: Medicare HMO | Admitting: Pulmonary Disease

## 2017-01-28 DIAGNOSIS — E669 Obesity, unspecified: Secondary | ICD-10-CM

## 2017-01-28 DIAGNOSIS — G4733 Obstructive sleep apnea (adult) (pediatric): Secondary | ICD-10-CM

## 2017-01-28 DIAGNOSIS — Z9989 Dependence on other enabling machines and devices: Secondary | ICD-10-CM

## 2017-01-28 NOTE — Progress Notes (Signed)
   Subjective:    Patient ID: Steven Munoz, male    DOB: 10/19/1941, 76 y.o.   MRN: [id number]  HPI  76/M,retired accountant for FU of severe obstructive sleep apnea s/p CABG 8/10.  PSG (289 Lbs) 2009 -showed severe OSA with predominant hypopneas, AHI 31/h corrected by CPAP +8 with large quattro mask   PLms seemed to improve with titration  Chief Complaint  Patient presents with  . Follow-up    1 yr f/u for OSA. Breathing has been fine since last visit. Machine and supplies have been working well for patient.    He got a new machine last year and really likes it. It is quieter. He wakes up feeling refreshed and denies daytime somnolence. He got a new full face mask and is leak is decreased. No problems with mask and pressure. He is able to use the humidity and denies dryness. He has been getting supplies on time from DME.  He has bipedal edema , takes thiazide for this  Download confirms excellent compliance on 8 cm with no residuals and minimal leak  Echo 04/2009 gr 2 DD     Review of Systems Patient denies significant dyspnea,cough, hemoptysis,  chest pain, palpitations, pedal edema, orthopnea, paroxysmal nocturnal dyspnea, lightheadedness, nausea, vomiting, abdominal or  leg pains      Objective:   Physical Exam  Gen. Pleasant, obese, in no distress ENT - no lesions, no post nasal drip Neck: No JVD, no thyromegaly, no carotid bruits Lungs: no use of accessory muscles, no dullness to percussion, decreased without rales or rhonchi  Cardiovascular: Rhythm regular, heart sounds  normal, no murmurs or gallops, no peripheral edema Musculoskeletal: No deformities, no cyanosis or clubbing , no tremors       Assessment & Plan:

## 2017-01-28 NOTE — Assessment & Plan Note (Signed)
Weight loss encouraged. He is started on a exercise program

## 2017-01-28 NOTE — Assessment & Plan Note (Signed)
CPAP is set at 8 cm Supplies will be renewed x 1 year  Weight loss encouraged, compliance with goal of at least 4-6 hrs every night is the expectation. Advised against medications with sedative side effects Cautioned against driving when sleepy - understanding that sleepiness will vary on a day to day basis

## 2017-01-28 NOTE — Patient Instructions (Signed)
CPAP is set at 8 cm Supplies will be renewed x 1 year

## 2017-01-29 ENCOUNTER — Ambulatory Visit: Payer: Self-pay | Admitting: Podiatry

## 2017-02-27 DIAGNOSIS — G510 Bell's palsy: Secondary | ICD-10-CM | POA: Diagnosis not present

## 2017-02-27 DIAGNOSIS — H16103 Unspecified superficial keratitis, bilateral: Secondary | ICD-10-CM | POA: Diagnosis not present

## 2017-02-27 DIAGNOSIS — H353121 Nonexudative age-related macular degeneration, left eye, early dry stage: Secondary | ICD-10-CM | POA: Diagnosis not present

## 2017-02-27 DIAGNOSIS — R69 Illness, unspecified: Secondary | ICD-10-CM | POA: Diagnosis not present

## 2017-02-27 DIAGNOSIS — D2312 Other benign neoplasm of skin of left eyelid, including canthus: Secondary | ICD-10-CM | POA: Diagnosis not present

## 2017-03-01 ENCOUNTER — Other Ambulatory Visit: Payer: Self-pay | Admitting: Internal Medicine

## 2017-04-12 ENCOUNTER — Other Ambulatory Visit: Payer: Self-pay | Admitting: Internal Medicine

## 2017-06-17 ENCOUNTER — Ambulatory Visit (INDEPENDENT_AMBULATORY_CARE_PROVIDER_SITE_OTHER): Payer: Medicare HMO | Admitting: Internal Medicine

## 2017-06-17 ENCOUNTER — Encounter: Payer: Self-pay | Admitting: Internal Medicine

## 2017-06-17 VITALS — BP 134/70 | HR 58 | Temp 98.0°F | Resp 14 | Ht 73.0 in | Wt 298.2 lb

## 2017-06-17 DIAGNOSIS — J302 Other seasonal allergic rhinitis: Secondary | ICD-10-CM | POA: Diagnosis not present

## 2017-06-17 DIAGNOSIS — Z0000 Encounter for general adult medical examination without abnormal findings: Secondary | ICD-10-CM

## 2017-06-17 DIAGNOSIS — I10 Essential (primary) hypertension: Secondary | ICD-10-CM | POA: Diagnosis not present

## 2017-06-17 DIAGNOSIS — E785 Hyperlipidemia, unspecified: Secondary | ICD-10-CM | POA: Diagnosis not present

## 2017-06-17 DIAGNOSIS — I2510 Atherosclerotic heart disease of native coronary artery without angina pectoris: Secondary | ICD-10-CM | POA: Diagnosis not present

## 2017-06-17 DIAGNOSIS — E039 Hypothyroidism, unspecified: Secondary | ICD-10-CM | POA: Diagnosis not present

## 2017-06-17 DIAGNOSIS — L309 Dermatitis, unspecified: Secondary | ICD-10-CM

## 2017-06-17 LAB — TSH: TSH: 3.63 u[IU]/mL (ref 0.35–4.50)

## 2017-06-17 LAB — COMPREHENSIVE METABOLIC PANEL
ALBUMIN: 4.3 g/dL (ref 3.5–5.2)
ALK PHOS: 52 U/L (ref 39–117)
ALT: 23 U/L (ref 0–53)
AST: 26 U/L (ref 0–37)
BILIRUBIN TOTAL: 1.5 mg/dL — AB (ref 0.2–1.2)
BUN: 18 mg/dL (ref 6–23)
CALCIUM: 9.6 mg/dL (ref 8.4–10.5)
CO2: 31 mEq/L (ref 19–32)
Chloride: 103 mEq/L (ref 96–112)
Creatinine, Ser: 0.92 mg/dL (ref 0.40–1.50)
GFR: 84.89 mL/min (ref >60.00)
GLUCOSE: 113 mg/dL — AB (ref 70–99)
POTASSIUM: 3.6 meq/L (ref 3.5–5.1)
Sodium: 141 mEq/L (ref 135–145)
TOTAL PROTEIN: 7.3 g/dL (ref 6.0–8.3)

## 2017-06-17 LAB — LIPID PANEL
CHOLESTEROL: 118 mg/dL (ref 0–200)
HDL: 42.6 mg/dL (ref >39.00)
LDL Cholesterol: 49 mg/dL (ref 0–99)
NONHDL: 75.17
TRIGLYCERIDES: 129 mg/dL (ref 0.0–149.0)
Total CHOL/HDL Ratio: 3
VLDL: 25.8 mg/dL (ref 0.0–40.0)

## 2017-06-17 MED ORDER — AZELASTINE HCL 0.1 % NA SOLN: 2.0000 | Freq: Every evening | NASAL | 6 refills | Status: DC | PRN

## 2017-06-17 NOTE — Assessment & Plan Note (Signed)
HTN: Continue with HCTZ, losartan, metoprolol. Checking labs Hyperlipidemia: On Crestor, checking labs Hypothyroidism: On Synthroid, check labs DJD: Tylenol as needed. CAD: Asx, goal is to control CV RF Allergies: Episodes of sneezing only in the morning, Flonase not helping, try Astelin at bedtime. Chronic dermatitis: Brownish discoloration of the pretibial  likely a chronic dermatitis, rx observation RTC 6 months

## 2017-06-17 NOTE — Assessment & Plan Note (Addendum)
-  Td 2011, pnm 23- 2012, prevnar - 2016;  zostavax 05-2016; shingrex (on back order) -CCS: multiple cscopes, last 08-2013 Dr Cher Nakai, per notes next 2019 -Last PSA 2013 (stable x years), elected no further screening -Counseled about diet. He is active most days going to the gym. Labs: CMP, FLP, TSH.

## 2017-06-17 NOTE — Progress Notes (Signed)
Pre visit review using our clinic review tool, if applicable. No additional management support is needed unless otherwise documented below in the visit note. 

## 2017-06-17 NOTE — Progress Notes (Signed)
Subjective:    Patient ID: Steven Munoz, male    DOB: 1941/07/15, 76 y.o.   MRN: [id number]  DOS:  06/17/2017 Type of visit - description : Physical exam Interval history:  In general feeling well, no major concerns  Review of Systems Reports persistent discoloration of the pretibial area. No actual redness, tenderness or warmness. C/o pain at the hand joints whenever he do things like open cans or work on his cars. Sneezing in the morning only several times, the rest of the day he is okay. No major problems with itchy eyes, itchy nose or postnasal dripping.   Other than above, a 14 point review of systems is negative    Past Medical History:  Diagnosis Date  . CAD (coronary artery disease) CABG  05/29/09    OPERATIVE PROCEDURES:  Median sternotomy, extracorporeal circulation,   . Colonic polyp 2002, 2005, 2009   [name]  . Dermatophytosis of nail   . Diverticulosis   . Dysmetabolic syndrome   . GERD (gastroesophageal reflux disease)    S/P dilation  2005  . Gout   . Hyperlipidemia   . Hypertension   . Hypothyroidism   . Murmur   . Nephrolithiasis    X 1  . Pneumonia age 73   treated as inpatient  . Sleep apnea    on CPAP; Dr [name]    Past Surgical History:  Procedure Laterality Date  . athroscopy     right knee X2, left knee X1  . COLONOSCOPY  2002 & 2009 &2014   polypectomy; [name]  . CORONARY ARTERY BYPASS GRAFT  2010   Median sternotomy, extracorporeal circulation, coronary bypass graft surgery x3 using a left internal mammary artery graft to left anterior descending coronary artery, with a sequential  saphenous vein graft to posterior descending and posterolateral branches  of the right coronary artery.  Endoscopic vein harvesting from the right  . Nail Alvusion Bilateral    Hallux  . NOSE SURGERY  11/20/12   L max antrectomy;septoplasty;turbinate reduction. [name]  . REPLACEMENT TOTAL KNEE     right 2003  . REPLACEMENT TOTAL KNEE     partial Left March 2009  . TENDON RELEASE Right 03/2016   from R hip. @ [name]  . TOTAL HIP ARTHROPLASTY  02/22/2014   right hip; Dr [facility]    Social History   Social History  . Marital status: Married    Spouse name: N/A  . Number of children: 2  . Years of education: N/A   Occupational History  . retired- tech sales  [place] History Main Topics  . Smoking status: Former Smoker    Packs/day: 1.00    Years: 6.00    Types: Cigarettes    Quit date: 10/28/1966  . Smokeless tobacco: Never Used     Comment: smoked [place], up to < 1 ppd  . Alcohol use 1.8 oz/week    3 Glasses of wine per week     Comment: socially  . Drug use: No  . Sexual activity: Not on file   Other Topics Concern  . Not on file   Social History Narrative   Socially; lives with spouse   Children 2; dtr lives in [place], son lives [place]   3 grandchildren      Family History  Problem Relation Age of Onset  . Hypertension Mother   . Transient ischemic attack Mother   . Lung cancer Mother  liver cancer, smoker, TIA  . Heart attack Father 31  . Heart attack Brother 55       smoker  . Diabetes Neg Hx   . Colon cancer Neg Hx   . Prostate cancer Neg Hx      Allergies as of 06/17/2017      Reactions   Povidone-iodine    REACTION: rash locally   Ofloxacin    Affected sleep with eye drop formulation   Tape Rash   Cloth adhesive tape-per patient Cloth adhesive tape-per patient Cloth adhesive tape-per patient      Medication List       Accurate as of 06/17/17  8:00 PM. Always use your most recent med list.          aspirin 325 MG tablet Take 325 mg by mouth daily.   azelastine 0.1 % nasal spray Commonly known as:  ASTELIN Place 2 sprays into both nostrils at bedtime as needed for rhinitis or allergies. Use in each nostril as directed   CoQ10 200 MG Caps Take by mouth.   fluticasone 50 MCG/ACT nasal spray Commonly known as:  FLONASE SHAKE  LIQUID AND USE 2 SPRAYS IN EACH NOSTRIL DAILY   hydrochlorothiazide 25 MG tablet Commonly known as:  HYDRODIURIL Take 1 tablet (25 mg total) by mouth daily.   levothyroxine 50 MCG tablet Commonly known as:  SYNTHROID, LEVOTHROID Take 1 tablet (50 mcg total) by mouth daily before breakfast.   losartan 100 MG tablet Commonly known as:  COZAAR Take 1 tablet (100 mg total) by mouth daily.   Magnesium 400 MG Caps Take 400 mg by mouth daily.   metoprolol tartrate 25 MG tablet Commonly known as:  LOPRESSOR Take 0.5 tablets (12.5 mg total) by mouth daily.   OMEGA 3 500 PO Take 500 mg by mouth daily.   omeprazole 20 MG capsule Commonly known as:  PRILOSEC Take 1 capsule (20 mg total) by mouth daily.   CENTRUM SILVER PO Take 1 tablet by mouth daily.   PRESERVISION AREDS PO Take 2 capsules by mouth daily.   rosuvastatin 20 MG tablet Commonly known as:  CRESTOR Take 1 tablet (20 mg total) by mouth daily.          Objective:   Physical Exam BP 134/70 (BP Location: Left Arm, Patient Position: Sitting, Cuff Size: Normal)   Pulse (!) 58   Temp 98 F (36.7 C) (Oral)   Resp 14   Ht 6\' 1"  (1.854 m)   Wt 298 lb 4 oz (135.3 kg)   SpO2 98%   BMI 39.35 kg/m   General:   Well developed, obese appearing . NAD.  Neck: No  thyromegaly  HEENT:  Normocephalic . Face symmetric, atraumatic Lungs:  CTA B Normal respiratory effort, no intercostal retractions, no accessory muscle use. Heart: RRR,  no murmur.  No pretibial edema bilaterally . Pretibial skin is brownish but not red, warm to touch. Abdomen:  Not distended, soft, non-tender. No rebound or rigidity.   Skin: Exposed areas without rash. Not pale. Not jaundice MSK: Fingers with bony changes consistent with DJD but no synovitis Neurologic:  alert & oriented X3.  Speech normal, gait appropriate for age and unassisted Strength symmetric and appropriate for age.  Psych: Cognition and judgment appear intact.  Cooperative  with normal attention span and concentration.  Behavior appropriate. No anxious or depressed appearing.    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 Checking labs Hyperlipidemia: On Crestor, checking labs Hypothyroidism: On Synthroid, check labs DJD: Tylenol as needed. CAD: Asx, goal is to control CV RF Allergies: Episodes of sneezing only in the morning, Flonase not helping, try Astelin at bedtime. Chronic dermatitis: Brownish discoloration of the pretibial  likely a chronic dermatitis, rx observation RTC 6 months

## 2017-06-17 NOTE — Patient Instructions (Signed)
GO TO THE LAB : Get the blood work     GO TO THE FRONT DESK Schedule your next appointment for a   checkup in 6 months  Consider a Medicare wellness with one  of our nurses   

## 2017-06-20 ENCOUNTER — Other Ambulatory Visit: Payer: Self-pay | Admitting: Internal Medicine

## 2017-06-23 ENCOUNTER — Other Ambulatory Visit: Payer: Self-pay

## 2017-06-23 DIAGNOSIS — G4733 Obstructive sleep apnea (adult) (pediatric): Secondary | ICD-10-CM | POA: Diagnosis not present

## 2017-06-23 MED ORDER — OMEPRAZOLE 20 MG PO CPDR: 20.0000 mg | DELAYED_RELEASE_CAPSULE | Freq: Every day | ORAL | 3 refills | Status: DC

## 2017-06-23 NOTE — Addendum Note (Signed)
Addended byDamita Dunnings D on: 06/23/2017 05:40 PM   Modules accepted: Orders

## 2017-07-13 ENCOUNTER — Encounter: Payer: Self-pay | Admitting: Internal Medicine

## 2017-07-14 MED ORDER — OMEPRAZOLE 20 MG PO CPDR: 20.0000 mg | DELAYED_RELEASE_CAPSULE | Freq: Every day | ORAL | 3 refills | Status: DC

## 2017-08-08 ENCOUNTER — Ambulatory Visit: Payer: Medicare HMO

## 2017-08-12 ENCOUNTER — Ambulatory Visit (INDEPENDENT_AMBULATORY_CARE_PROVIDER_SITE_OTHER): Payer: Medicare HMO

## 2017-08-12 DIAGNOSIS — Z23 Encounter for immunization: Secondary | ICD-10-CM

## 2017-08-29 ENCOUNTER — Other Ambulatory Visit: Payer: Self-pay | Admitting: Internal Medicine

## 2017-08-29 DIAGNOSIS — I10 Essential (primary) hypertension: Secondary | ICD-10-CM

## 2017-09-12 ENCOUNTER — Other Ambulatory Visit: Payer: Self-pay | Admitting: Internal Medicine

## 2017-10-03 ENCOUNTER — Other Ambulatory Visit: Payer: Self-pay | Admitting: Internal Medicine

## 2017-10-10 DIAGNOSIS — H04223 Epiphora due to insufficient drainage, bilateral lacrimal glands: Secondary | ICD-10-CM | POA: Diagnosis not present

## 2017-10-10 DIAGNOSIS — H353121 Nonexudative age-related macular degeneration, left eye, early dry stage: Secondary | ICD-10-CM | POA: Diagnosis not present

## 2017-10-10 DIAGNOSIS — H25813 Combined forms of age-related cataract, bilateral: Secondary | ICD-10-CM | POA: Diagnosis not present

## 2017-10-29 DIAGNOSIS — H04221 Epiphora due to insufficient drainage, right lacrimal gland: Secondary | ICD-10-CM | POA: Diagnosis not present

## 2017-10-29 DIAGNOSIS — H04222 Epiphora due to insufficient drainage, left lacrimal gland: Secondary | ICD-10-CM | POA: Diagnosis not present

## 2017-12-01 DIAGNOSIS — M9901 Segmental and somatic dysfunction of cervical region: Secondary | ICD-10-CM | POA: Diagnosis not present

## 2017-12-01 DIAGNOSIS — M62830 Muscle spasm of back: Secondary | ICD-10-CM | POA: Diagnosis not present

## 2017-12-01 DIAGNOSIS — M542 Cervicalgia: Secondary | ICD-10-CM | POA: Diagnosis not present

## 2017-12-01 DIAGNOSIS — M5417 Radiculopathy, lumbosacral region: Secondary | ICD-10-CM | POA: Diagnosis not present

## 2017-12-01 DIAGNOSIS — M9903 Segmental and somatic dysfunction of lumbar region: Secondary | ICD-10-CM | POA: Diagnosis not present

## 2017-12-08 ENCOUNTER — Other Ambulatory Visit: Payer: Self-pay

## 2017-12-08 ENCOUNTER — Ambulatory Visit (HOSPITAL_COMMUNITY): Payer: Medicare HMO | Attending: Cardiology

## 2017-12-08 DIAGNOSIS — I071 Rheumatic tricuspid insufficiency: Secondary | ICD-10-CM | POA: Diagnosis not present

## 2017-12-08 DIAGNOSIS — I480 Paroxysmal atrial fibrillation: Secondary | ICD-10-CM | POA: Insufficient documentation

## 2017-12-08 DIAGNOSIS — G4733 Obstructive sleep apnea (adult) (pediatric): Secondary | ICD-10-CM | POA: Diagnosis not present

## 2017-12-08 DIAGNOSIS — R011 Cardiac murmur, unspecified: Secondary | ICD-10-CM | POA: Insufficient documentation

## 2017-12-08 DIAGNOSIS — I4891 Unspecified atrial fibrillation: Secondary | ICD-10-CM | POA: Diagnosis not present

## 2017-12-08 DIAGNOSIS — I10 Essential (primary) hypertension: Secondary | ICD-10-CM | POA: Diagnosis not present

## 2017-12-08 DIAGNOSIS — Z8249 Family history of ischemic heart disease and other diseases of the circulatory system: Secondary | ICD-10-CM | POA: Insufficient documentation

## 2017-12-08 DIAGNOSIS — Z87891 Personal history of nicotine dependence: Secondary | ICD-10-CM | POA: Insufficient documentation

## 2017-12-08 DIAGNOSIS — E785 Hyperlipidemia, unspecified: Secondary | ICD-10-CM | POA: Insufficient documentation

## 2017-12-08 DIAGNOSIS — I2510 Atherosclerotic heart disease of native coronary artery without angina pectoris: Secondary | ICD-10-CM | POA: Diagnosis not present

## 2017-12-10 NOTE — Progress Notes (Signed)
Patient ID: Steven Munoz, male   DOB: October 21, 1941, 77 y.o.   MRN: [id number]   77 y.o. CABG August 2010 He had ostial LAD, distal RCA/PDA disease and had LIMA to LAD SVG Sequential to PDA and PLB EF normal circumflex not grafted. Dr [name] follow him for OSA and CPAP Activity limited by right hip pain post surgery at [facility] and f/u tendon release. Dr Clent Jacks.  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  All other systems reviewed and negative  General: BP 130/72   Pulse 77   Ht 6' 0.5" (1.842 m)   Wt 281 lb (127.5 kg)   BMI 37.59 kg/m  Affect appropriate Overweight white male  HEENT: normal Neck supple with no adenopathy JVP normal no bruits no thyromegaly Lungs clear with no wheezing and good diaphragmatic motion Heart:  S1/S2 SEM murmur, no rub, gallop or click PMI normal  Post sternotomy  Abdomen: benighn, BS positve, no tenderness, no AAA no bruit.  No HSM or HJR Distal pulses intact with no bruits RLE varicosities  Neuro non-focal Skin warm and dry No muscular weakness    Current Outpatient Medications  Medication Sig Dispense Refill  . aspirin 325 MG tablet Take 325 mg by mouth daily.      Marland Kitchen azelastine (ASTELIN) 0.1 % nasal spray Place 2 sprays into both nostrils at bedtime as needed for rhinitis or allergies. Use in each nostril as directed 30 mL 6  . Coenzyme Q10 (COQ10) 200 MG CAPS Take by mouth.    . fluticasone (FLONASE) 50 MCG/ACT nasal spray SHAKE LIQUID AND USE  2 SPRAYS IN EACH NOSTRIL DAILY 16 g 0  . hydrochlorothiazide (HYDRODIURIL) 25 MG tablet Take 1 tablet (25 mg total) by mouth daily. 90 tablet 1  . levothyroxine (SYNTHROID, LEVOTHROID) 50 MCG tablet Take 1 tablet (50 mcg total) daily before breakfast by mouth. 90 tablet 1  . losartan (COZAAR) 100 MG tablet Take 1 tablet (100 mg total) by mouth daily. 90 tablet 1  . Magnesium 400 MG CAPS Take 400 mg by mouth daily.    . metoprolol tartrate (LOPRESSOR) 25 MG tablet Take 0.5 tablets (12.5 mg total) by mouth daily. 45 tablet 1  . Multiple Vitamins-Minerals (CENTRUM SILVER PO) Take 1 tablet by mouth daily.    . Multiple Vitamins-Minerals (PRESERVISION AREDS PO) Take 2 capsules by mouth daily.    . Omega-3 Fatty Acids (OMEGA 3 500 PO) Take 500 mg by mouth daily.    Marland Kitchen omeprazole (PRILOSEC) 20 MG capsule Take 1 capsule (20 mg total) by mouth daily. 90 capsule 3  . rosuvastatin (CRESTOR) 20 MG tablet Take 1 tablet (20 mg total) by mouth daily. 90 tablet 1   No current facility-administered medications for this visit.     Allergies  Povidone-iodine; Ofloxacin; and Tape  Electrocardiogram:  3/15  SB rate 49 otherwise normal  05/20/16  SR rate 63 PR 210 PVC otherwise normal  12/11/17 SR rate 77 PR 224 PVC nonspecific ST changes  Assessment and Plan  Murmur:  Reviewed echo from 12/08/17 EF 55-60% AV sclerosis trivial MR no significant valve Dx  CAD/CABG: 2010 lima to LAD and SVG to PDA/PLB no angina continue medical Rx   PAF: post op no recurrence ASA and beta blocker LA severely dilated on most recent echo 52 mm Puts him at increased risk of recurrence   HTN: Well controlled.  Continue current medications and low sodium Dash type diet.    Thyroid:  Lab Results  Component Value Date   TSH 3.63 06/17/2017    Cholesterol  Lab Results  Component Value Date   LDLCALC 49 06/17/2017    Ortho: post right hip surgery [name]  Post tendon release in June 2018  with relief of pain  First Degree: no  change on ECG today no high grade AV block   ENT:  F/u [facility] bells palsy resolved still with tinnitus wearing hearing aids   F/U in a year    [employer]

## 2017-12-11 ENCOUNTER — Encounter: Payer: Self-pay | Admitting: Cardiovascular Disease

## 2017-12-11 ENCOUNTER — Ambulatory Visit: Payer: Medicare HMO | Admitting: Cardiovascular Disease

## 2017-12-11 VITALS — BP 130/72 | HR 77 | Ht 72.5 in | Wt 281.0 lb

## 2017-12-11 DIAGNOSIS — I10 Essential (primary) hypertension: Secondary | ICD-10-CM

## 2017-12-11 DIAGNOSIS — R011 Cardiac murmur, unspecified: Secondary | ICD-10-CM

## 2017-12-11 DIAGNOSIS — I25810 Atherosclerosis of coronary artery bypass graft(s) without angina pectoris: Secondary | ICD-10-CM

## 2017-12-11 DIAGNOSIS — I480 Paroxysmal atrial fibrillation: Secondary | ICD-10-CM

## 2017-12-11 NOTE — Patient Instructions (Addendum)

## 2017-12-15 DIAGNOSIS — M9903 Segmental and somatic dysfunction of lumbar region: Secondary | ICD-10-CM | POA: Diagnosis not present

## 2017-12-15 DIAGNOSIS — M9901 Segmental and somatic dysfunction of cervical region: Secondary | ICD-10-CM | POA: Diagnosis not present

## 2017-12-15 DIAGNOSIS — M5417 Radiculopathy, lumbosacral region: Secondary | ICD-10-CM | POA: Diagnosis not present

## 2017-12-15 DIAGNOSIS — M542 Cervicalgia: Secondary | ICD-10-CM | POA: Diagnosis not present

## 2017-12-15 DIAGNOSIS — M62830 Muscle spasm of back: Secondary | ICD-10-CM | POA: Diagnosis not present

## 2017-12-16 ENCOUNTER — Ambulatory Visit (INDEPENDENT_AMBULATORY_CARE_PROVIDER_SITE_OTHER): Payer: Medicare HMO | Admitting: Internal Medicine

## 2017-12-16 ENCOUNTER — Encounter: Payer: Self-pay | Admitting: Internal Medicine

## 2017-12-16 VITALS — BP 122/72 | HR 58 | Temp 97.8°F | Resp 14 | Ht 73.0 in | Wt 280.4 lb

## 2017-12-16 DIAGNOSIS — R739 Hyperglycemia, unspecified: Secondary | ICD-10-CM

## 2017-12-16 DIAGNOSIS — E039 Hypothyroidism, unspecified: Secondary | ICD-10-CM | POA: Diagnosis not present

## 2017-12-16 DIAGNOSIS — I10 Essential (primary) hypertension: Secondary | ICD-10-CM

## 2017-12-16 LAB — BASIC METABOLIC PANEL
BUN: 19 mg/dL (ref 6–23)
CHLORIDE: 99 meq/L (ref 96–112)
CO2: 31 meq/L (ref 19–32)
Calcium: 9.9 mg/dL (ref 8.4–10.5)
Creatinine, Ser: 0.97 mg/dL (ref 0.40–1.50)
GFR: 79.76 mL/min (ref >60.00)
GLUCOSE: 105 mg/dL — AB (ref 70–99)
POTASSIUM: 3.8 meq/L (ref 3.5–5.1)
Sodium: 137 mEq/L (ref 135–145)

## 2017-12-16 LAB — HEMOGLOBIN A1C: Hgb A1c MFr Bld: 5.7 % (ref 4.6–6.5)

## 2017-12-16 LAB — TSH: TSH: 2.71 u[IU]/mL (ref 0.35–4.50)

## 2017-12-16 NOTE — Progress Notes (Signed)
Pre visit review using our clinic review tool, if applicable. No additional management support is needed unless otherwise documented below in the visit note. 

## 2017-12-16 NOTE — Patient Instructions (Signed)
GO TO THE LAB : Get the blood work     GO TO THE FRONT DESK Schedule your next appointment for a  Physical in 6-7 months, fasting   Check the  blood pressure 2 or 3 times a month   Be sure your blood pressure is between 110/65 and  135/85. If it is consistently higher or lower, let me know   GENERAL INFORMATION ABOUT HEALTHY EATING, CHOLESTEROL, HIGH BLOOD PRESSURE :  The American Heart Association, www.heart.org  Check the "Life's Simple 7" from the [facility] The American diabetes Association www.diabetes.org  "[facility] A [facility] [place]" book

## 2017-12-16 NOTE — Progress Notes (Signed)
Subjective:    Patient ID: Steven Munoz, male    DOB: [birth year], 77 y.o.   MRN: [id number]  DOS:  12/16/2017 Type of visit - description : rov Interval history: Since the last office visit is doing well, has changed his diet, eating less carbohydrates, per his own scales has lost 25 pounds Still going to the gym, 2- 3 times a week Overall feels better No ambulatory BPs recently, they are usually in the 130s  Wt Readings from Last 3 Encounters:  12/16/17 280 lb 6 oz (127.2 kg)  12/11/17 281 lb (127.5 kg)  06/17/17 298 lb 4 oz (135.3 kg)     Review of Systems   Past Medical History:  Diagnosis Date  . CAD (coronary artery disease) CABG  05/29/09    OPERATIVE PROCEDURES:  Median sternotomy, extracorporeal circulation,   . Colonic polyp 2002, 2005, 2009   [name]  . Dermatophytosis of nail   . Diverticulosis   . Dysmetabolic syndrome   . GERD (gastroesophageal reflux disease)    S/P dilation  2005  . Gout   . Hyperlipidemia   . Hypertension   . Hypothyroidism   . Murmur   . Nephrolithiasis    X 1  . Pneumonia age 25   treated as inpatient  . Sleep apnea    on CPAP; Dr [name]    Past Surgical History:  Procedure Laterality Date  . athroscopy     right knee X2, left knee X1  . COLONOSCOPY  2002 & 2009 &2014   polypectomy; [name]  . CORONARY ARTERY BYPASS GRAFT  2010   Median sternotomy, extracorporeal circulation, coronary bypass graft surgery x3 using a left internal mammary artery graft to left anterior descending coronary artery, with a sequential  saphenous vein graft to posterior descending and posterolateral branches  of the right coronary artery.  Endoscopic vein harvesting from the right  . Nail Alvusion Bilateral    Hallux  . NOSE SURGERY  11/20/12   L max antrectomy;septoplasty;turbinate reduction. [name]  . REPLACEMENT TOTAL KNEE     right 2003  . REPLACEMENT TOTAL KNEE     partial Left March 2009  . TENDON RELEASE Right 03/2016     from R hip. @ [name]  . TOTAL HIP ARTHROPLASTY  02/22/2014   right hip; Dr [facility]    Social History   Socioeconomic History  . Marital status: Married    Spouse name: Not on file  . Number of children: 2  . Years of education: Not on file  . Highest education level: Not on file  Social Needs  . Financial resource strain: Not on file  . Food insecurity - worry: Not on file  . Food insecurity - inability: Not on file  . Transportation needs - medical: Not on file  . Transportation needs - non-medical: Not on file  Occupational History  . Occupation: retired- Chiropodist: [employer]  Tobacco Use  . Smoking status: Former Smoker    Packs/day: 1.00    Years: 6.00    Pack years: 6.00    Types: Cigarettes    Last attempt to quit: 10/28/1966    Years since quitting: 51.1  . Smokeless tobacco: Never Used  . Tobacco comment: smoked [place], up to < 1 ppd  Substance and Sexual Activity  . Alcohol use: Yes    Alcohol/week: 1.8 oz    Types: 3 Glasses of wine per  week    Comment: socially  . Drug use: No  . Sexual activity: Not on file  Other Topics Concern  . Not on file  Social History Narrative   Socially; lives with spouse   Children 2; dtr lives in [place], son lives [place]   3 grandchildren       Allergies as of 12/16/2017      Reactions   Povidone-iodine    REACTION: rash locally   Ofloxacin    Affected sleep with eye drop formulation   Tape Rash   Cloth adhesive tape-per patient Cloth adhesive tape-per patient Cloth adhesive tape-per patient      Medication List        Accurate as of 12/16/17 11:59 PM. Always use your most recent med list.          aspirin 325 MG tablet Take 325 mg by mouth daily.   azelastine 0.1 % nasal spray Commonly known as:  ASTELIN Place 2 sprays into both nostrils at bedtime as needed for rhinitis or allergies. Use in each nostril as directed   CoQ10 200 MG Caps Take by mouth.    fluticasone 50 MCG/ACT nasal spray Commonly known as:  FLONASE SHAKE LIQUID AND USE 2 SPRAYS IN EACH NOSTRIL DAILY   hydrochlorothiazide 25 MG tablet Commonly known as:  HYDRODIURIL Take 1 tablet (25 mg total) by mouth daily.   levothyroxine 50 MCG tablet Commonly known as:  SYNTHROID, LEVOTHROID Take 1 tablet (50 mcg total) daily before breakfast by mouth.   losartan 100 MG tablet Commonly known as:  COZAAR Take 1 tablet (100 mg total) by mouth daily.   Magnesium 400 MG Caps Take 400 mg by mouth daily.   metoprolol tartrate 25 MG tablet Commonly known as:  LOPRESSOR Take 0.5 tablets (12.5 mg total) by mouth daily.   OMEGA 3 500 PO Take 500 mg by mouth daily.   omeprazole 20 MG capsule Commonly known as:  PRILOSEC Take 1 capsule (20 mg total) by mouth daily.   OSTEO BI-FLEX REGULAR STRENGTH PO Take 2 tablets by mouth daily.   CENTRUM SILVER PO Take 1 tablet by mouth daily.   PRESERVISION AREDS PO Take 2 capsules by mouth daily.   rosuvastatin 20 MG tablet Commonly known as:  CRESTOR Take 1 tablet (20 mg total) by mouth daily.          Objective:   Physical Exam BP 122/72 (BP Location: Left Arm, Patient Position: Sitting, Cuff Size: Normal)   Pulse (!) 58   Temp 97.8 F (36.6 C) (Oral)   Resp 14   Ht 6\' 1"  (1.854 m)   Wt 280 lb 6 oz (127.2 kg)   SpO2 96%   BMI 36.99 kg/m  General:   Well developed, well nourished . NAD.  HEENT:  Normocephalic . Face symmetric, atraumatic Lungs:  CTA B Normal respiratory effort, no intercostal retractions, no accessory muscle use. Heart: RRR,  no murmur.  Skin: Not pale. Not jaundice Neurologic:  alert & oriented X3.  Speech normal, gait appropriate for age and unassisted Psych--  Cognition and judgment appear intact.  Cooperative with normal attention span and concentration.  Behavior appropriate. No anxious or depressed appearing.      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  Doing great with lifestyle, lost per his own scales 25 pounds, praised! Rec to continue his low carbohydrate diet.  He believes  this new way to eat is sustainable. HTN: Seems controlled on HCTZ, losartan, metoprolol.  As he continue losing weight we may need to adjust his BP meds, patient aware, check a BMP Hypothyroidism: Check a TSH, on Synthroid. CAD: Last visit with cardiology was found to be stable. RTC 6-7 months, CPX, fasting

## 2017-12-17 ENCOUNTER — Other Ambulatory Visit: Payer: Self-pay | Admitting: Internal Medicine

## 2017-12-17 NOTE — Assessment & Plan Note (Signed)
Hyperglycemia: Check a A1c.  Doing great with lifestyle, lost per his own scales 25 pounds, praised! Rec to continue his low carbohydrate diet.  He believes  this new way to eat is sustainable. HTN: Seems controlled on HCTZ, losartan, metoprolol.  As he continue losing weight we may need to adjust his BP meds, patient aware, check a BMP Hypothyroidism: Check a TSH, on Synthroid. CAD: Last visit with cardiology was found to be stable. RTC 6-7 months, CPX, fasting

## 2017-12-18 DIAGNOSIS — R69 Illness, unspecified: Secondary | ICD-10-CM | POA: Diagnosis not present

## 2017-12-22 DIAGNOSIS — Z96641 Presence of right artificial hip joint: Secondary | ICD-10-CM | POA: Diagnosis not present

## 2017-12-22 DIAGNOSIS — Z09 Encounter for follow-up examination after completed treatment for conditions other than malignant neoplasm: Secondary | ICD-10-CM | POA: Diagnosis not present

## 2017-12-22 DIAGNOSIS — M7062 Trochanteric bursitis, left hip: Secondary | ICD-10-CM | POA: Insufficient documentation

## 2017-12-22 DIAGNOSIS — M25552 Pain in left hip: Secondary | ICD-10-CM | POA: Diagnosis not present

## 2017-12-22 DIAGNOSIS — M25551 Pain in right hip: Secondary | ICD-10-CM | POA: Diagnosis not present

## 2017-12-29 DIAGNOSIS — M5417 Radiculopathy, lumbosacral region: Secondary | ICD-10-CM | POA: Diagnosis not present

## 2017-12-29 DIAGNOSIS — M542 Cervicalgia: Secondary | ICD-10-CM | POA: Diagnosis not present

## 2017-12-29 DIAGNOSIS — M62830 Muscle spasm of back: Secondary | ICD-10-CM | POA: Diagnosis not present

## 2017-12-29 DIAGNOSIS — M9901 Segmental and somatic dysfunction of cervical region: Secondary | ICD-10-CM | POA: Diagnosis not present

## 2017-12-29 DIAGNOSIS — M9903 Segmental and somatic dysfunction of lumbar region: Secondary | ICD-10-CM | POA: Diagnosis not present

## 2018-01-28 ENCOUNTER — Ambulatory Visit: Payer: Medicare HMO | Admitting: Adult Health

## 2018-01-29 ENCOUNTER — Encounter: Payer: Self-pay | Admitting: Adult Health

## 2018-01-29 ENCOUNTER — Ambulatory Visit: Payer: Medicare HMO | Admitting: Adult Health

## 2018-01-29 DIAGNOSIS — G4733 Obstructive sleep apnea (adult) (pediatric): Secondary | ICD-10-CM

## 2018-01-29 DIAGNOSIS — Z9989 Dependence on other enabling machines and devices: Secondary | ICD-10-CM | POA: Diagnosis not present

## 2018-01-29 DIAGNOSIS — E669 Obesity, unspecified: Secondary | ICD-10-CM | POA: Diagnosis not present

## 2018-01-29 NOTE — Addendum Note (Signed)
Addended by: Parke Poisson E on: 01/29/2018 02:45 PM   Modules accepted: Orders

## 2018-01-29 NOTE — Assessment & Plan Note (Signed)
Wt loss  

## 2018-01-29 NOTE — Progress Notes (Signed)
@Patient  ID: Steven Munoz, male    DOB: [birth year], 77 y.o.   MRN: [id number]  Chief Complaint  Patient presents with  . 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  Says he is doing well on CPAP.  He feels he benefits from CPAP with no significant daytime sleepiness.  Says he wears his CPAP each night. Download shows excellent compliance with average usage is at 7.5 hours.  Patient is on CPAP 8 cm H2O.  AHI 1.0.  Minimum leaks.  Is doing well. Remains active, goes to gym 3 days a week .on low carb diet , lost 37 lbs over last 6 months .  Very focused on healthy lifestyle  Allergies  Allergen Reactions  . Povidone-Iodine     REACTION: rash locally  . Ofloxacin     Affected sleep with eye drop formulation  . Tape Rash    Cloth adhesive tape-per patient Cloth adhesive tape-per patient Cloth adhesive tape-per patient    Immunization History  Administered Date(s) Administered  . Influenza Split 08/21/2011, 08/26/2012  . Influenza Whole 07/29/2008, 08/10/2009, 08/28/2010  . Influenza, High Dose Seasonal PF 08/13/2016, 08/12/2017  . Influenza,inj,Quad PF,6+ Mos 08/31/2013, 08/16/2014, 07/25/2015  . Pneumococcal Conjugate-13 05/09/2015  . Pneumococcal Polysaccharide-23 05/27/2011  . Td 05/18/2010  . Zoster 06/06/2016    Past Medical History:  Diagnosis Date  . CAD (coronary artery disease) CABG  05/29/09    OPERATIVE PROCEDURES:  Median sternotomy, extracorporeal circulation,   . Colonic polyp 2002, 2005, 2009   [name]  . Dermatophytosis of nail   . Diverticulosis   . Dysmetabolic syndrome   . GERD (gastroesophageal reflux disease)    S/P dilation  2005  . Gout   . Hyperlipidemia   . Hypertension   . Hypothyroidism   . Murmur   . Nephrolithiasis    X 1  . Pneumonia age 85   treated as inpatient  . Sleep apnea     on CPAP; Dr [name]    Tobacco History: Social History   Tobacco Use  Smoking Status Former Smoker  . Packs/day: 1.00  . Years: 6.00  . Pack years: 6.00  . Types: Cigarettes  . Last attempt to quit: 10/28/1966  . Years since quitting: 51.2  Smokeless Tobacco Never Used  Tobacco Comment   smoked [place], up to < 1 ppd   Counseling given: Not Answered Comment: smoked [place], up to < 1 ppd   Outpatient Encounter Medications as of 01/29/2018  Medication Sig  . aspirin 325 MG tablet Take 325 mg by mouth daily.    . Coenzyme Q10 (COQ10) 200 MG CAPS Take by mouth.  . fluticasone (FLONASE) 50 MCG/ACT nasal spray SHAKE LIQUID AND USE 2 SPRAYS IN EACH NOSTRIL DAILY  . Glucosamine-Chondroitin (OSTEO BI-FLEX REGULAR STRENGTH PO) Take 2 tablets by mouth daily.  . hydrochlorothiazide (HYDRODIURIL) 25 MG tablet Take 1 tablet (25 mg total) by mouth daily.  Marland Kitchen levothyroxine (SYNTHROID, LEVOTHROID) 50 MCG tablet Take 1 tablet (50 mcg total) daily before breakfast by mouth.  . losartan (COZAAR) 100 MG tablet Take 1 tablet (100 mg total) by mouth daily.  . Magnesium 400 MG CAPS Take 400 mg by mouth daily.  . metoprolol tartrate (LOPRESSOR) 25 MG tablet Take 0.5 tablets (  12.5 mg total) by mouth daily.  . Multiple Vitamins-Minerals (CENTRUM SILVER PO) Take 1 tablet by mouth daily.  . Multiple Vitamins-Minerals (PRESERVISION AREDS PO) Take 2 capsules by mouth daily.  . Omega-3 Fatty Acids (OMEGA 3 500 PO) Take 500 mg by mouth daily.  Marland Kitchen omeprazole (PRILOSEC) 20 MG capsule Take 1 capsule (20 mg total) by mouth daily.  . rosuvastatin (CRESTOR) 20 MG tablet Take 1 tablet (20 mg total) by mouth daily.  Marland Kitchen azelastine (ASTELIN) 0.1 % nasal spray Place 2 sprays into both nostrils at bedtime as needed for rhinitis or allergies. Use in each nostril as directed (Patient not taking: Reported on 01/29/2018)   No facility-administered encounter medications on file as of 01/29/2018.      Review of  Systems  Constitutional:   No  weight loss, night sweats,  Fevers, chills, fatigue, or  lassitude.  HEENT:   No headaches,  Difficulty swallowing,  Tooth/dental problems, or  Sore throat,                No sneezing, itching, ear ache, nasal congestion, post nasal drip,   CV:  No chest pain,  Orthopnea, PND, swelling in lower extremities, anasarca, dizziness, palpitations, syncope.   GI  No heartburn, indigestion, abdominal pain, nausea, vomiting, diarrhea, change in bowel habits, loss of appetite, bloody stools.   Resp: No shortness of breath with exertion or at rest.  No excess mucus, no productive cough,  No non-productive cough,  No coughing up of blood.  No change in color of mucus.  No wheezing.  No chest wall deformity  Skin: no rash or lesions.  GU: no dysuria, change in color of urine, no urgency or frequency.  No flank pain, no hematuria   MS:  No joint pain or swelling.  No decreased range of motion.  No back pain.    Physical Exam  BP (!) 120/58 (BP Location: Left Arm, Cuff Size: Normal)   Pulse (!) 56   Ht 6' (1.829 m)   Wt 265 lb 6.4 oz (120.4 kg)   SpO2 97%   BMI 35.99 kg/m   GEN: A/Ox3; pleasant , NAD , obese    HEENT:  [place]/AT,  EACs-clear, TMs-wnl, NOSE-clear, THROAT-clear, no lesions, no postnasal drip or exudate noted. Class 2 MP airway   NECK:  Supple w/ fair ROM; no JVD; normal carotid impulses w/o bruits; no thyromegaly or nodules palpated; no lymphadenopathy.    RESP  Clear  P & A; w/o, wheezes/ rales/ or rhonchi. no accessory muscle use, no dullness to percussion  CARD:  RRR, no m/r/g, tr peripheral edema, pulses intact, no cyanosis or clubbing.  GI:   Soft & nt; nml bowel sounds; no organomegaly or masses detected.   Musco: Warm bil, no deformities or joint swelling noted.   Neuro: alert, no focal deficits noted.    Skin: Warm, no lesions or rashes    Lab Results:   BNP No results found for: BNP  ProBNP No results found for:  PROBNP  Imaging: No results found.   Assessment & Plan:   OSA on CPAP Controlled on CPAP At bedtime    Plan  Patient Instructions  Continue on CPAP At bedtime  .  Keep up good work .  Work on healthy weight .  Follow up with Dr. [name]  In 1 year and As needed        Obesity (BMI 30-39.9) Wt loss .      Rexene Edison, NP  01/29/2018  

## 2018-01-29 NOTE — Assessment & Plan Note (Signed)
Controlled on CPAP At bedtime    Plan  Patient Instructions  Continue on CPAP At bedtime  .  Keep up good work .  Work on healthy weight .  Follow up with Dr. [name]  In 1 year and As needed

## 2018-01-29 NOTE — Patient Instructions (Signed)
Continue on CPAP At bedtime   Keep up good work  Work on healthy weight .  Follow up with Dr. Alva  In 1 year and As needed   

## 2018-02-02 DIAGNOSIS — M9901 Segmental and somatic dysfunction of cervical region: Secondary | ICD-10-CM | POA: Diagnosis not present

## 2018-02-02 DIAGNOSIS — M542 Cervicalgia: Secondary | ICD-10-CM | POA: Diagnosis not present

## 2018-02-02 DIAGNOSIS — M5417 Radiculopathy, lumbosacral region: Secondary | ICD-10-CM | POA: Diagnosis not present

## 2018-02-02 DIAGNOSIS — M9903 Segmental and somatic dysfunction of lumbar region: Secondary | ICD-10-CM | POA: Diagnosis not present

## 2018-02-02 DIAGNOSIS — M62830 Muscle spasm of back: Secondary | ICD-10-CM | POA: Diagnosis not present

## 2018-02-05 DIAGNOSIS — H0011 Chalazion right upper eyelid: Secondary | ICD-10-CM | POA: Diagnosis not present

## 2018-02-22 ENCOUNTER — Other Ambulatory Visit: Payer: Self-pay | Admitting: Internal Medicine

## 2018-03-09 DIAGNOSIS — G4733 Obstructive sleep apnea (adult) (pediatric): Secondary | ICD-10-CM | POA: Diagnosis not present

## 2018-03-16 DIAGNOSIS — M9901 Segmental and somatic dysfunction of cervical region: Secondary | ICD-10-CM | POA: Diagnosis not present

## 2018-03-16 DIAGNOSIS — M5417 Radiculopathy, lumbosacral region: Secondary | ICD-10-CM | POA: Diagnosis not present

## 2018-03-16 DIAGNOSIS — M9903 Segmental and somatic dysfunction of lumbar region: Secondary | ICD-10-CM | POA: Diagnosis not present

## 2018-03-16 DIAGNOSIS — M542 Cervicalgia: Secondary | ICD-10-CM | POA: Diagnosis not present

## 2018-03-16 DIAGNOSIS — M62830 Muscle spasm of back: Secondary | ICD-10-CM | POA: Diagnosis not present

## 2018-03-17 ENCOUNTER — Other Ambulatory Visit: Payer: Self-pay | Admitting: Internal Medicine

## 2018-03-17 DIAGNOSIS — I10 Essential (primary) hypertension: Secondary | ICD-10-CM

## 2018-03-26 DIAGNOSIS — R69 Illness, unspecified: Secondary | ICD-10-CM | POA: Diagnosis not present

## 2018-04-09 DIAGNOSIS — H0100A Unspecified blepharitis right eye, upper and lower eyelids: Secondary | ICD-10-CM | POA: Diagnosis not present

## 2018-04-09 DIAGNOSIS — H1033 Unspecified acute conjunctivitis, bilateral: Secondary | ICD-10-CM | POA: Diagnosis not present

## 2018-04-13 DIAGNOSIS — M542 Cervicalgia: Secondary | ICD-10-CM | POA: Diagnosis not present

## 2018-04-13 DIAGNOSIS — M5417 Radiculopathy, lumbosacral region: Secondary | ICD-10-CM | POA: Diagnosis not present

## 2018-04-13 DIAGNOSIS — M62830 Muscle spasm of back: Secondary | ICD-10-CM | POA: Diagnosis not present

## 2018-04-13 DIAGNOSIS — M9903 Segmental and somatic dysfunction of lumbar region: Secondary | ICD-10-CM | POA: Diagnosis not present

## 2018-04-13 DIAGNOSIS — M9901 Segmental and somatic dysfunction of cervical region: Secondary | ICD-10-CM | POA: Diagnosis not present

## 2018-05-11 DIAGNOSIS — M9901 Segmental and somatic dysfunction of cervical region: Secondary | ICD-10-CM | POA: Diagnosis not present

## 2018-05-11 DIAGNOSIS — M62830 Muscle spasm of back: Secondary | ICD-10-CM | POA: Diagnosis not present

## 2018-05-11 DIAGNOSIS — M5417 Radiculopathy, lumbosacral region: Secondary | ICD-10-CM | POA: Diagnosis not present

## 2018-05-11 DIAGNOSIS — M542 Cervicalgia: Secondary | ICD-10-CM | POA: Diagnosis not present

## 2018-05-11 DIAGNOSIS — M9903 Segmental and somatic dysfunction of lumbar region: Secondary | ICD-10-CM | POA: Diagnosis not present

## 2018-05-12 ENCOUNTER — Other Ambulatory Visit: Payer: Self-pay | Admitting: Internal Medicine

## 2018-05-12 MED ORDER — METOPROLOL TARTRATE 25 MG PO TABS: 12.5000 mg | ORAL_TABLET | Freq: Every day | ORAL | 0 refills | Status: DC

## 2018-05-12 NOTE — Telephone Encounter (Signed)
Rx sent to [facility] on [address] (this is normally the local pharmacy he uses).

## 2018-05-12 NOTE — Telephone Encounter (Signed)
Copied from [place] [id number]. Topic: Quick Communication - Rx Refill/Question >> May 12, 2018  8:43 AM [name] wrote: Medication: metoprolol tartrate (LOPRESSOR) 25 MG tablet [[id number]] pt states that pharmacy canceled prescription because of a mistake the pharmacy made. Wanted to know if you could call in 20 so that he has enough to last him till the mail order service gets the medication to him.   Has the patient contacted their pharmacy? Yes  Preferred Pharmacy (with phone number or street name): [facility] [address]   Agent: Please be advised that RX refills may take up to 3 business days. We ask that you follow-up with your pharmacy.

## 2018-05-20 DIAGNOSIS — L821 Other seborrheic keratosis: Secondary | ICD-10-CM | POA: Diagnosis not present

## 2018-05-20 DIAGNOSIS — L720 Epidermal cyst: Secondary | ICD-10-CM | POA: Diagnosis not present

## 2018-05-20 DIAGNOSIS — L570 Actinic keratosis: Secondary | ICD-10-CM | POA: Diagnosis not present

## 2018-05-20 DIAGNOSIS — D2372 Other benign neoplasm of skin of left lower limb, including hip: Secondary | ICD-10-CM | POA: Diagnosis not present

## 2018-05-28 DIAGNOSIS — R69 Illness, unspecified: Secondary | ICD-10-CM | POA: Diagnosis not present

## 2018-06-02 ENCOUNTER — Telehealth: Payer: Self-pay | Admitting: Internal Medicine

## 2018-06-02 DIAGNOSIS — L720 Epidermal cyst: Secondary | ICD-10-CM | POA: Diagnosis not present

## 2018-06-02 MED ORDER — METOPROLOL TARTRATE 25 MG PO TABS: 12.5000 mg | ORAL_TABLET | Freq: Every day | ORAL | 0 refills | Status: DC

## 2018-06-02 NOTE — Telephone Encounter (Signed)
Copied from [place] [id number]. Topic: Quick Communication - Rx Refill/Question >> Jun 02, 2018 11:44 AM Selinda Flavin B, NT wrote: Medication: metoprolol tartrate (LOPRESSOR) 25 MG tablet (90 day supply)  Has the patient contacted their pharmacy? Yes.   (Agent: If no, request that the patient contact the pharmacy for the refill.) (Agent: If yes, when and what did the pharmacy advise?)  Preferred Pharmacy (with phone number or street name): [place], [place] - [address]   Agent: Please be advised that RX refills may take up to 3 business days. We ask that you follow-up with your pharmacy.

## 2018-06-05 MED ORDER — METOPROLOL TARTRATE 25 MG PO TABS: 25.0000 mg | ORAL_TABLET | Freq: Two times a day (BID) | ORAL | 0 refills | Status: DC

## 2018-06-05 NOTE — Telephone Encounter (Signed)
Metoprolol refill Last Refill: 06/02/18 #45 no RF Last OV: 12/16/17 PCP: Dr Larose Kells [facility]  15 day supply ordered for pt until pt gets mail order supply

## 2018-06-05 NOTE — Telephone Encounter (Signed)
Patient called and states that his medication metoprolol tartrate (LOPRESSOR) 25 MG tablet is not going to be delivered to him until late next week. He is wondering if Dr. Larose Kells can write him a new RX for at least 10 days until his full RX gets in from the mail order.     [id number] [address], [place] [phone] (Phone) [phone] (Fax)

## 2018-06-05 NOTE — Addendum Note (Signed)
Addended by: Corky Sox E on: 06/05/2018 12:17 PM   Modules accepted: Orders

## 2018-06-11 ENCOUNTER — Telehealth: Payer: Self-pay | Admitting: Internal Medicine

## 2018-06-11 MED ORDER — METOPROLOL TARTRATE 25 MG PO TABS: 12.5000 mg | ORAL_TABLET | Freq: Every day | ORAL | 0 refills | Status: DC

## 2018-06-11 NOTE — Telephone Encounter (Signed)
PEC sent wrong sig for metoprolol on 8/9- Rx re-sent w/ correct sig.

## 2018-06-11 NOTE — Telephone Encounter (Signed)
Copied from [place] [id number]. Topic: General - Other >> Jun 11, 2018  4:02 PM Judyann Munson wrote: Reason for CRM: Patient is callling to state the medication  directions are not correct . For the medication metoprolol tartrate (LOPRESSOR) 25 MG tablet  the direction he stated the direction should say Route: Take 0.5 tablets (12.5 mg total) by mouth daily. - Oral. He stated he needs a flat pill to cut in half. His contact number is [phone].  Please advise

## 2018-06-15 NOTE — Progress Notes (Signed)
Patient ID: Delia Chimes, male   DOB: 23-Mar-1941, 77 y.o.   MRN: [id number]   77 y.o. CABG August 2010 He had ostial LAD, distal RCA/PDA disease and had LIMA to LAD SVG Sequential to PDA and PLB EF normal circumflex not grafted. Dr [name] follow him for OSA and CPAP Activity limited by right hip pain post surgery at [facility] and f/u tendon release. Dr Clent Jacks.  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  All other systems reviewed and negative  General: BP 116/68   Pulse (!) 58   Ht 6' (1.829 m)   Wt 254 lb (115.2 kg)   SpO2 96%   BMI 34.45 kg/m  Affect appropriate Overweight white male  RLE varicosities HEENT: Tinnitus  Neck supple with no adenopathy JVP normal no bruits no thyromegaly Lungs clear with no wheezing and good diaphragmatic motion Heart:  S1/S2 no murmur, no rub, gallop or click PMI normal Abdomen: benighn, BS positve, no tenderness, no AAA no bruit.  No HSM or HJR Distal pulses intact with no bruits Neuro non-focal Skin warm and dry Right TKR       Current Outpatient Medications  Medication Sig Dispense Refill  . aspirin 325 MG tablet Take 325 mg by mouth daily.      Marland Kitchen azelastine (ASTELIN) 0.1 % nasal spray Place 2 sprays into both nostrils at bedtime as needed for rhinitis or allergies. Use in each nostril as directed 30 mL 6  . Coenzyme Q10 (COQ10) 200 MG CAPS Take by mouth.    . fluticasone (FLONASE) 50 MCG/ACT nasal spray  SHAKE LIQUID AND USE 2 SPRAYS IN EACH NOSTRIL DAILY 16 g 0  . Glucosamine-Chondroitin (OSTEO BI-FLEX REGULAR STRENGTH PO) Take 2 tablets by mouth daily.    . hydrochlorothiazide (HYDRODIURIL) 25 MG tablet Take 1 tablet (25 mg total) by mouth daily. 90 tablet 1  . levothyroxine (SYNTHROID, LEVOTHROID) 50 MCG tablet Take 1 tablet (50 mcg total) by mouth daily before breakfast. 90 tablet 1  . losartan (COZAAR) 100 MG tablet Take 1 tablet (100 mg total) by mouth daily. 90 tablet 1  . Magnesium 400 MG CAPS Take 400 mg by mouth daily.    . metoprolol tartrate (LOPRESSOR) 25 MG tablet Take 0.5 tablets (12.5 mg total) by mouth daily. 15 tablet 0  . Multiple Vitamins-Minerals (CENTRUM SILVER PO) Take 1 tablet by mouth daily.    . Omega-3 Fatty Acids (OMEGA 3 500 PO) Take 500 mg by mouth daily.    Marland Kitchen omeprazole (PRILOSEC) 20 MG capsule Take 1 capsule (20 mg total) by mouth daily. 90 capsule 3  . rosuvastatin (CRESTOR) 20 MG tablet Take 1 tablet (20 mg total) by mouth daily. 90 tablet 1  . TURMERIC PO Take by mouth as directed.     No current facility-administered medications for this visit.     Allergies  Povidone-iodine; Ofloxacin; and Tape  Electrocardiogram:  3/15  SB rate 49 otherwise normal  05/20/16  SR rate 63 PR 210 PVC otherwise normal  12/11/17 SR rate 77 PR 224 PVC nonspecific ST changes  Assessment and Plan  Murmur:  Reviewed echo from 12/08/17 EF 55-60% AV sclerosis trivial MR no significant valve Dx  CAD/CABG: 2010 lima to LAD and SVG to PDA/PLB no angina continue medical Rx   PAF: post op no recurrence ASA and beta blocker LA severely dilated on most echo  12/08/17 57 mm Puts him at increased risk of recurrence   HTN: Well controlled.  Continue current medications and low sodium Dash type diet.    Thyroid:  Lab Results  Component Value Date   TSH 2.71 12/16/2017    Cholesterol  Lab Results  Component Value Date   LDLCALC 49 06/17/2017    Ortho: post right hip surgery  [name]  Post tendon release in June 2018  with relief of pain  First Degree: no change on ECG today no high grade AV block   ENT:  F/u [facility] bells palsy resolved still with tinnitus wearing hearing aids   F/U in a year    [employer]

## 2018-06-16 ENCOUNTER — Encounter: Payer: Self-pay | Admitting: Cardiovascular Disease

## 2018-06-16 ENCOUNTER — Ambulatory Visit: Payer: Medicare HMO | Admitting: Cardiovascular Disease

## 2018-06-16 VITALS — BP 116/68 | HR 58 | Ht 72.0 in | Wt 254.0 lb

## 2018-06-16 DIAGNOSIS — I10 Essential (primary) hypertension: Secondary | ICD-10-CM

## 2018-06-16 DIAGNOSIS — R011 Cardiac murmur, unspecified: Secondary | ICD-10-CM | POA: Diagnosis not present

## 2018-06-16 DIAGNOSIS — I25810 Atherosclerosis of coronary artery bypass graft(s) without angina pectoris: Secondary | ICD-10-CM | POA: Diagnosis not present

## 2018-06-16 DIAGNOSIS — I480 Paroxysmal atrial fibrillation: Secondary | ICD-10-CM | POA: Diagnosis not present

## 2018-06-16 NOTE — Patient Instructions (Addendum)

## 2018-06-17 ENCOUNTER — Other Ambulatory Visit: Payer: Self-pay | Admitting: Internal Medicine

## 2018-06-18 ENCOUNTER — Other Ambulatory Visit: Payer: Self-pay | Admitting: Internal Medicine

## 2018-06-22 ENCOUNTER — Ambulatory Visit (INDEPENDENT_AMBULATORY_CARE_PROVIDER_SITE_OTHER): Payer: Medicare HMO | Admitting: Internal Medicine

## 2018-06-22 ENCOUNTER — Encounter: Payer: Self-pay | Admitting: Internal Medicine

## 2018-06-22 VITALS — BP 142/78 | HR 65 | Temp 98.4°F | Resp 16 | Ht 72.0 in | Wt 255.6 lb

## 2018-06-22 DIAGNOSIS — Z0000 Encounter for general adult medical examination without abnormal findings: Secondary | ICD-10-CM

## 2018-06-22 DIAGNOSIS — E039 Hypothyroidism, unspecified: Secondary | ICD-10-CM | POA: Diagnosis not present

## 2018-06-22 LAB — LIPID PANEL
Cholesterol: 138 mg/dL (ref 0–200)
HDL: 52.7 mg/dL (ref >39.00)
LDL Cholesterol: 64 mg/dL (ref 0–99)
NonHDL: 85.59
TRIGLYCERIDES: 110 mg/dL (ref 0.0–149.0)
Total CHOL/HDL Ratio: 3
VLDL: 22 mg/dL (ref 0.0–40.0)

## 2018-06-22 LAB — COMPREHENSIVE METABOLIC PANEL
ALK PHOS: 66 U/L (ref 39–117)
ALT: 16 U/L (ref 0–53)
AST: 17 U/L (ref 0–37)
Albumin: 4.5 g/dL (ref 3.5–5.2)
BUN: 16 mg/dL (ref 6–23)
CALCIUM: 10 mg/dL (ref 8.4–10.5)
CO2: 34 mEq/L — ABNORMAL HIGH (ref 19–32)
Chloride: 102 mEq/L (ref 96–112)
Creatinine, Ser: 0.91 mg/dL (ref 0.40–1.50)
GFR: 85.74 mL/min (ref >60.00)
GLUCOSE: 109 mg/dL — AB (ref 70–99)
POTASSIUM: 4.1 meq/L (ref 3.5–5.1)
Sodium: 140 mEq/L (ref 135–145)
TOTAL PROTEIN: 7.1 g/dL (ref 6.0–8.3)
Total Bilirubin: 1.2 mg/dL (ref 0.2–1.2)

## 2018-06-22 LAB — CBC
HEMATOCRIT: 45.1 % (ref 39.0–52.0)
HEMOGLOBIN: 15.4 g/dL (ref 13.0–17.0)
MCHC: 34.2 g/dL (ref 30.0–36.0)
MCV: 96.3 fl (ref 78.0–100.0)
Platelets: 185 10*3/uL (ref 150.0–400.0)
RBC: 4.69 Mil/uL (ref 4.22–5.81)
RDW: 13.2 % (ref 11.5–15.5)
WBC: 8.7 10*3/uL (ref 4.0–10.5)

## 2018-06-22 LAB — TSH: TSH: 3.51 u[IU]/mL (ref 0.35–4.50)

## 2018-06-22 NOTE — Assessment & Plan Note (Addendum)
-  Td 2011, pnm 23- 2012, prevnar - 2016;  zostavax 05-2016 (had Bell's Palsy shortly after it, rec to avoid shingrix for now) -CCS: multiple cscopes, last 08-2013 Dr Cher Nakai, per notes next 2019, plans to see GI -Last PSA 2013 (stable x years), elected no further screening, confirmed today -life style : doing well , has lost some wt on his scales  Labs: CMP, FLP, CBC, TSH

## 2018-06-22 NOTE — Progress Notes (Signed)
Subjective:    Patient ID: Steven Munoz, male    DOB: 10-31-1940, 77 y.o.   MRN: [id number]  DOS:  06/22/2018 Type of visit - description : cpx Interval history: Since the last office visit he is doing well.  Wt Readings from Last 3 Encounters:  06/22/18 255 lb 9.6 oz (115.9 kg)  06/16/18 254 lb (115.2 kg)  01/29/18 265 lb 6.4 oz (120.4 kg)   BP Readings from Last 3 Encounters:  06/22/18 (!) 142/78  06/16/18 116/68  01/29/18 (!) 120/58     Review of Systems  A 14 point review of systems is negative    Past Medical History:  Diagnosis Date  . CAD (coronary artery disease) CABG  05/29/09    OPERATIVE PROCEDURES:  Median sternotomy, extracorporeal circulation,   . Colonic polyp 2002, 2005, 2009   [name]  . Dermatophytosis of nail   . Diverticulosis   . Dysmetabolic syndrome   . GERD (gastroesophageal reflux disease)    S/P dilation  2005  . Gout   . Hyperlipidemia   . Hypertension   . Hypothyroidism   . Murmur   . Nephrolithiasis    X 1  . Pneumonia age 34   treated as inpatient  . Sleep apnea    on CPAP; Dr [name]    Past Surgical History:  Procedure Laterality Date  . athroscopy     right knee X2, left knee X1  . COLONOSCOPY  2002 & 2009 &2014   polypectomy; [name]  . CORONARY ARTERY BYPASS GRAFT  2010   Median sternotomy, extracorporeal circulation, coronary bypass graft surgery x3 using a left internal mammary artery graft to left anterior descending coronary artery, with a sequential  saphenous vein graft to posterior descending and posterolateral branches  of the right coronary artery.  Endoscopic vein harvesting from the right  . Nail Alvusion Bilateral    Hallux  . NOSE SURGERY  11/20/12   L max antrectomy;septoplasty;turbinate reduction. [name]  . REPLACEMENT TOTAL KNEE     right 2003  . REPLACEMENT TOTAL KNEE     partial Left March 2009  . TENDON RELEASE Right 03/2016   from R hip. @ [name]  . TOTAL HIP ARTHROPLASTY   02/22/2014   right hip; Dr [facility]   Family History  Problem Relation Age of Onset  . Hypertension Mother   . Transient ischemic attack Mother   . Lung cancer Mother        liver cancer, smoker, TIA  . Heart attack Father 16  . Heart attack Brother 81       smoker  . Diabetes Neg Hx   . Colon cancer Neg Hx   . Prostate cancer Neg Hx     Social History   Socioeconomic History  . Marital status: Married    Spouse name: Not on file  . Number of children: 2  . Years of education: Not on file  . Highest education level: Not on file  Occupational History  . Occupation: retired- Chiropodist: [employer]  . Financial resource strain: Not on file  . Food insecurity:    Worry: Not on file    Inability: Not on file  . Transportation needs:    Medical: Not on file    Non-medical: Not on file  Tobacco Use  . Smoking status: Former Smoker    Packs/day: 1.00    Years: 6.00  Pack years: 6.00    Types: Cigarettes    Last attempt to quit: 10/28/1966    Years since quitting: 51.6  . Smokeless tobacco: Never Used  . Tobacco comment: smoked [place], up to < 1 ppd  Substance and Sexual Activity  . Alcohol use: Yes    Alcohol/week: 3.0 standard drinks    Types: 3 Glasses of wine per week    Comment: socially  . Drug use: No  . Sexual activity: Not on file  Lifestyle  . Physical activity:    Days per week: Not on file    Minutes per session: Not on file  . Stress: Not on file  Relationships  . Social connections:    Talks on phone: Not on file    Gets together: Not on file    Attends religious service: Not on file    Active member of club or organization: Not on file    Attends meetings of clubs or organizations: Not on file    Relationship status: Not on file  . Intimate partner violence:    Fear of current or ex partner: Not on file    Emotionally abused: Not on file    Physically abused: Not on file    Forced sexual  activity: Not on file  Other Topics Concern  . Not on file  Social History Narrative   Socially; lives with spouse   Children 2; dtr lives in [place], son lives [place]   3 grandchildren       Allergies as of 06/22/2018      Reactions   Povidone-iodine    REACTION: rash locally   Ofloxacin    Affected sleep with eye drop formulation   Tape Rash   Cloth adhesive tape-per patient Cloth adhesive tape-per patient Cloth adhesive tape-per patient      Medication List        Accurate as of 06/22/18 10:05 AM. Always use your most recent med list.          aspirin EC 81 MG tablet Take 81 mg by mouth daily.   azelastine 0.1 % nasal spray Commonly known as:  ASTELIN Place 2 sprays into both nostrils at bedtime as needed for rhinitis or allergies. Use in each nostril as directed   CENTRUM SILVER PO Take 1 tablet by mouth daily.   PRESERVISION AREDS 2+MULTI VIT Caps Take by mouth.   CoQ10 200 MG Caps Take by mouth.   fluticasone 50 MCG/ACT nasal spray Commonly known as:  FLONASE SHAKE LIQUID AND USE 2 SPRAYS IN EACH NOSTRIL DAILY   hydrochlorothiazide 25 MG tablet Commonly known as:  HYDRODIURIL Take 1 tablet (25 mg total) by mouth daily.   levothyroxine 50 MCG tablet Commonly known as:  SYNTHROID, LEVOTHROID Take 1 tablet (50 mcg total) by mouth daily before breakfast.   losartan 100 MG tablet Commonly known as:  COZAAR Take 1 tablet (100 mg total) by mouth daily.   Magnesium 400 MG Caps Take 400 mg by mouth daily.   metoprolol tartrate 25 MG tablet Commonly known as:  LOPRESSOR TAKE 1/2 TABLET BY MOUTH EVERY DAY   OMEGA 3 500 PO Take 500 mg by mouth daily.   omeprazole 20 MG capsule Commonly known as:  PRILOSEC Take 1 capsule (20 mg total) by mouth daily.   OSTEO BI-FLEX REGULAR STRENGTH PO Take 2 tablets by mouth daily.   rosuvastatin 20 MG tablet Commonly known as:  CRESTOR Take 1 tablet (20 mg total) by mouth  daily.   TURMERIC PO Take by mouth  as directed.          Objective:   Physical Exam BP (!) 142/78 (BP Location: Left Arm, Patient Position: Sitting, Cuff Size: Large)   Pulse 65   Temp 98.4 F (36.9 C) (Oral)   Resp 16   Ht 6' (1.829 m)   Wt 255 lb 9.6 oz (115.9 kg)   SpO2 96%   BMI 34.67 kg/m  General: Well developed, NAD, see BMI.  Neck: No  thyromegaly  HEENT:  Normocephalic . Face symmetric, atraumatic Lungs:  CTA B Normal respiratory effort, no intercostal retractions, no accessory muscle use. Heart: RRR,  no murmur.  No pretibial edema bilaterally  Abdomen:  Not distended, soft, non-tender. No rebound or rigidity.  No mass, no bruit Skin: Exposed areas without rash. Not pale. Not jaundice Neurologic:  alert & oriented X3.  Speech normal, gait appropriate for age and unassisted Strength symmetric and appropriate for age.  Psych: Cognition and judgment appear intact.  Cooperative with normal attention span and concentration.  Behavior appropriate. No anxious or depressed appearing.     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  No change, monitor BPs, continue HCTZ, losartan, metoprolol. Hyperlipidemia: Continue Crestor.  Checking labs Hypothyroidism: Continue Synthroid, checking labs CAD, PAF: Saw cardiology 05-2018, felt to be stable. RTC 6 months

## 2018-06-22 NOTE — Patient Instructions (Signed)
GO TO THE LAB : Get the blood work     GO TO THE FRONT DESK Schedule your next appointment for a checkup in 6 months     Check the  blood pressure 2 or 3 times a week Be sure your blood pressure is between 110/65 and  135/85. If it is consistently higher or lower, let me know    

## 2018-06-22 NOTE — Assessment & Plan Note (Signed)
Hyperglycemia: A1cs a stable over time, doing great with diet and exercise HTN: BP today slightly elevated but last week was great.  No change, monitor BPs, continue HCTZ, losartan, metoprolol. Hyperlipidemia: Continue Crestor.  Checking labs Hypothyroidism: Continue Synthroid, checking labs CAD, PAF: Saw cardiology 05-2018, felt to be stable. RTC 6 months

## 2018-07-01 ENCOUNTER — Other Ambulatory Visit: Payer: Self-pay

## 2018-07-01 MED ORDER — OMEPRAZOLE 20 MG PO CPDR: 20.0000 mg | DELAYED_RELEASE_CAPSULE | Freq: Every day | ORAL | 3 refills | Status: DC

## 2018-07-08 IMAGING — US US EXTREM LOW VENOUS*R*
1 series · 14 of 24 positions shown · non-contrast
Comparison: None.

CLINICAL DATA: Right leg cellulitis. History of fall 2-3 weeks ago.

EXAM:
RIGHT LOWER EXTREMITY VENOUS DOPPLER ULTRASOUND
TECHNIQUE: Gray-scale sonography with graded compression, as well as color
Doppler and duplex ultrasound, were performed to evaluate the deep
venous system from the level of the common femoral vein through the
popliteal and proximal calf veins. Spectral Doppler was utilized to
evaluate flow at rest and with distal augmentation maneuvers.

[Series 1: us extrem low venous*right* · 0.12mm/px · 14 of 31 slices shown]
[im 1/31]
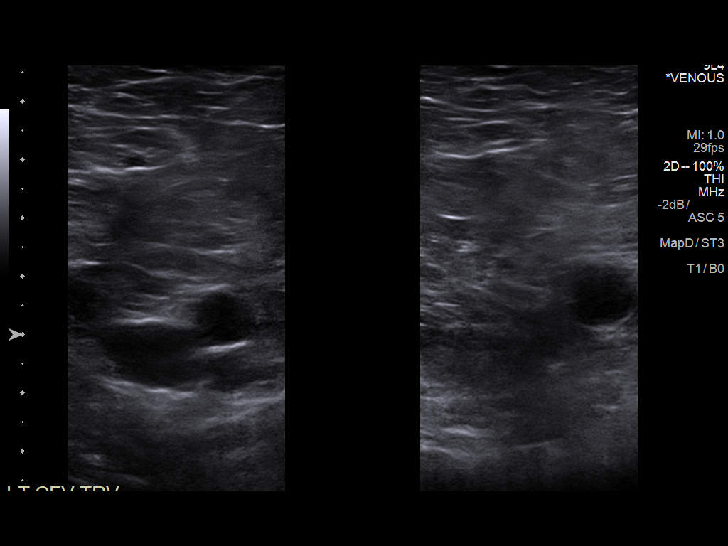
[im 3/31]
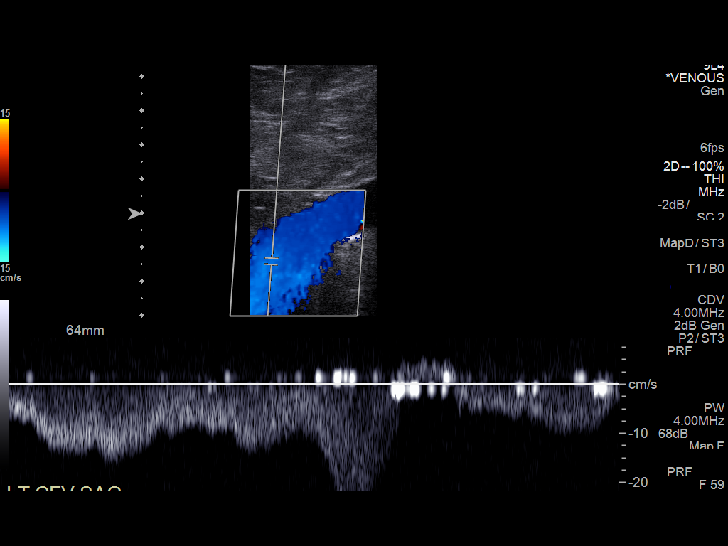
[im 6/31]
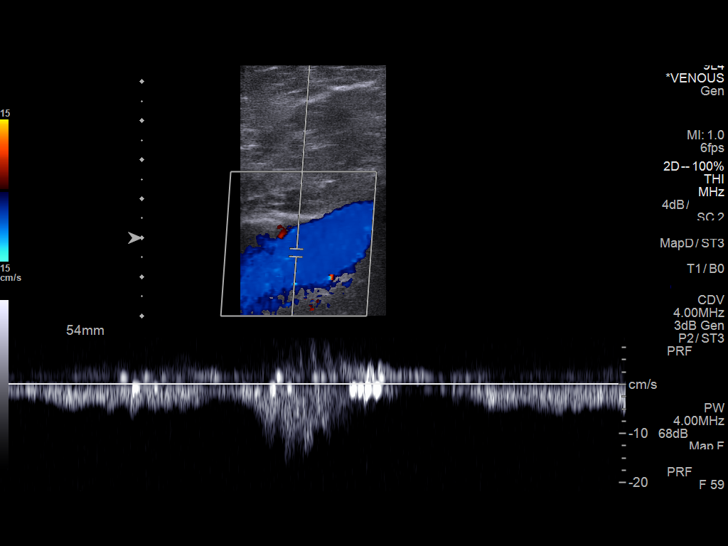
[im 8/31]
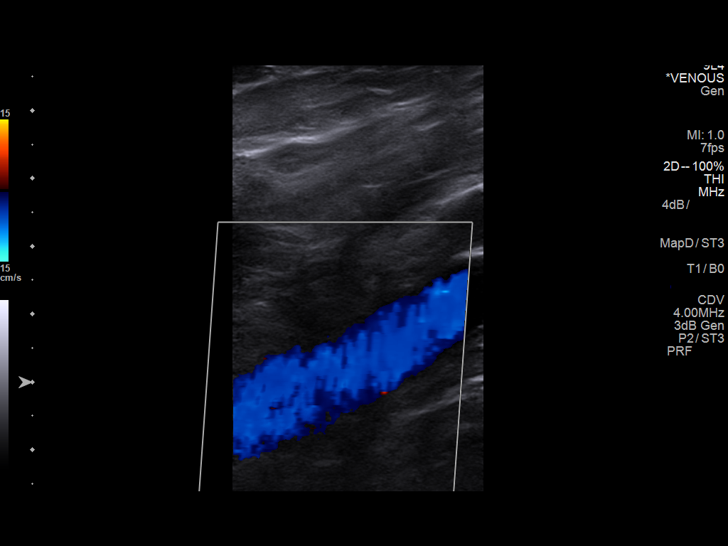
[im 10/31]
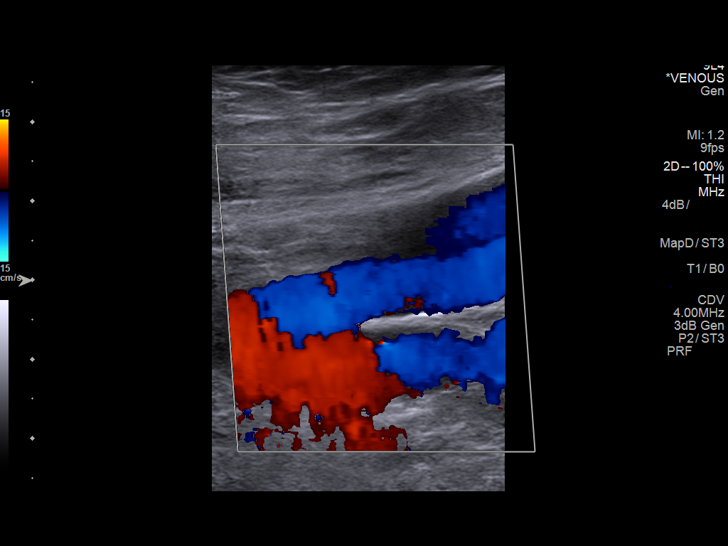
[im 12/31]
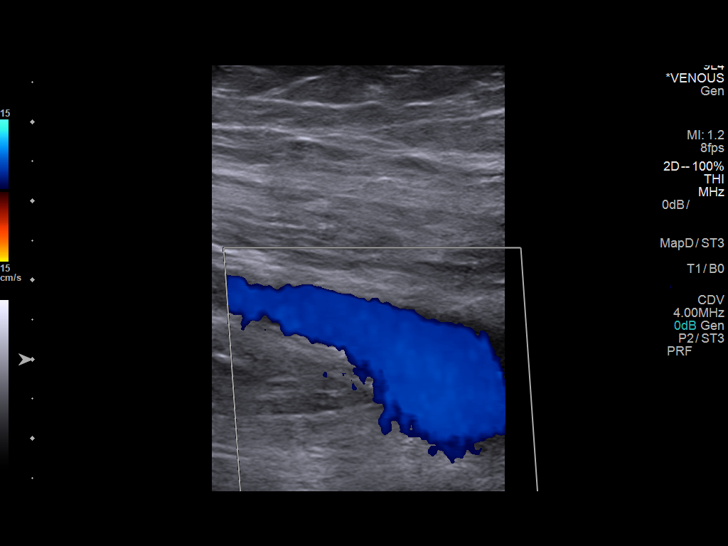
[im 15/31]
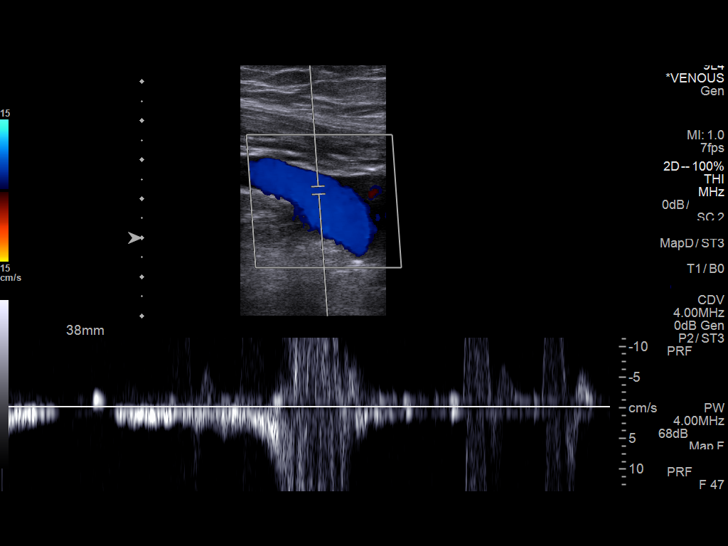
[im 16/31]
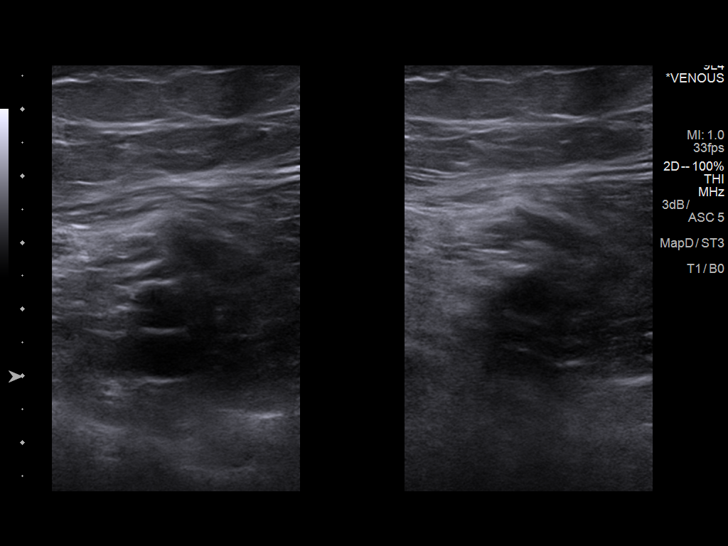
[im 19/31]
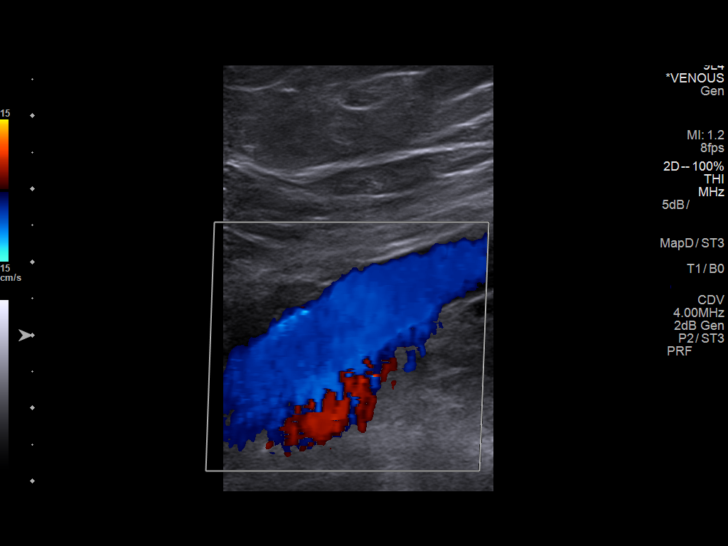
[im 21/31]
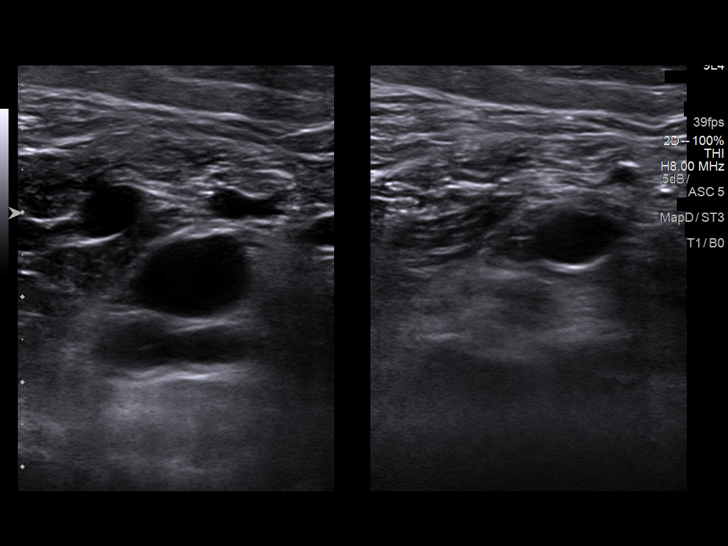
[im 24/31]
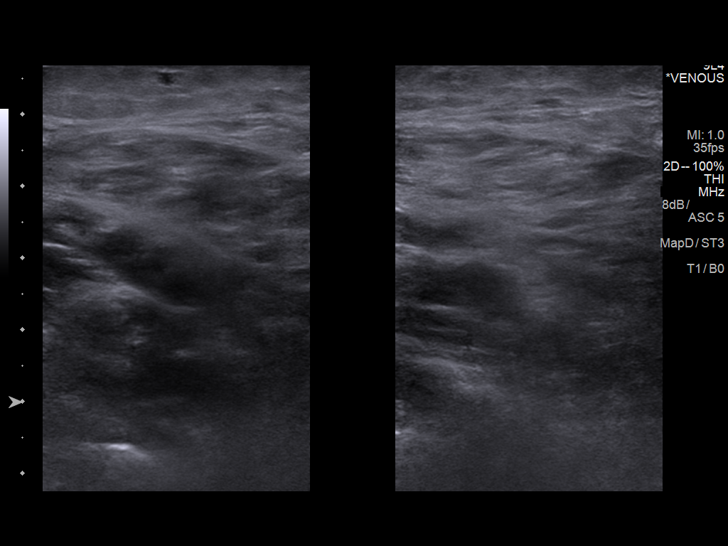
[im 25/31]
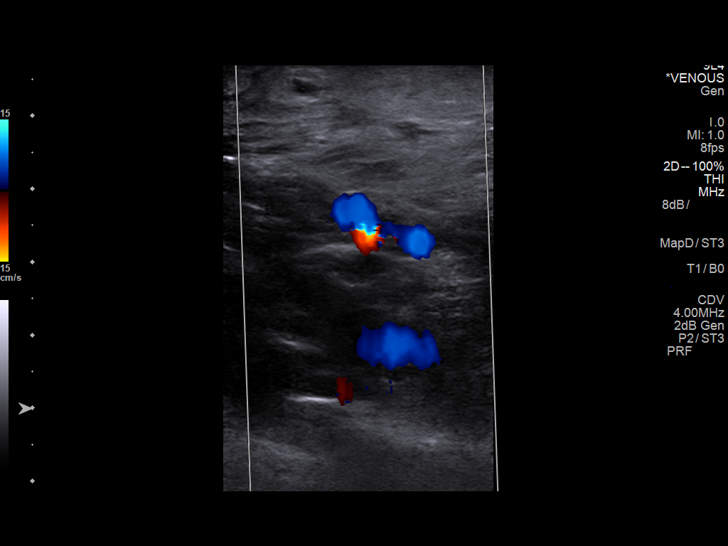
[im 28/31]
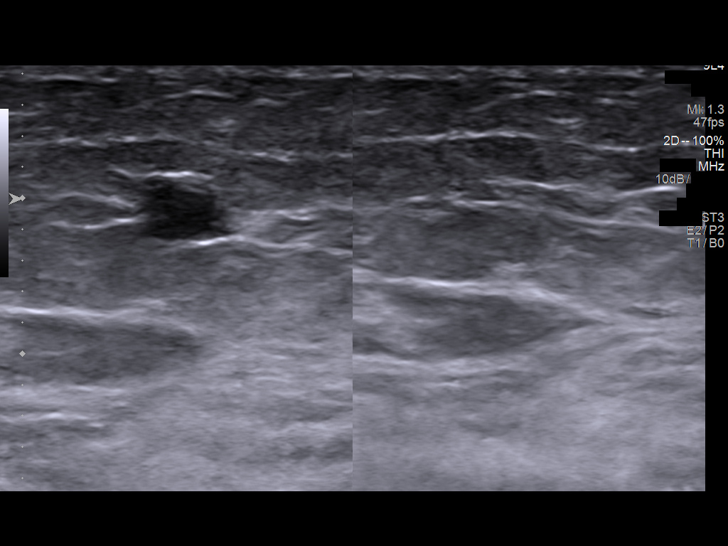
[im 31/31]
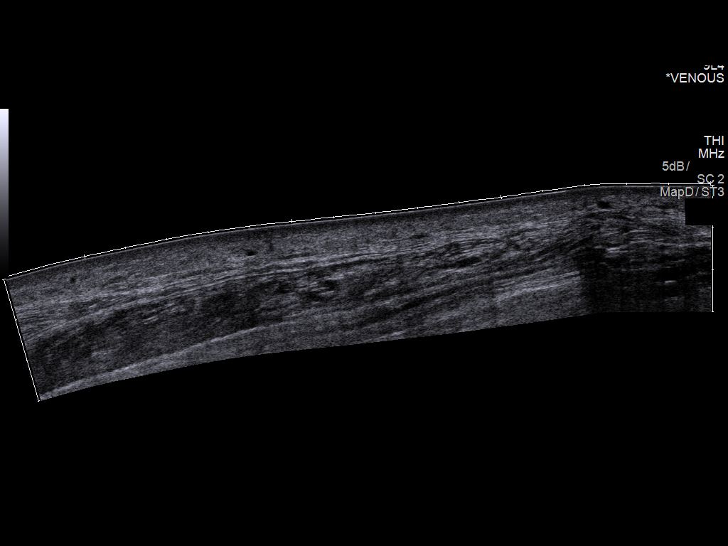

[14 of 24 positions shown; findings below may reference images not displayed]

FINDINGS: Left common femoral vein is patent without thrombus.

Normal compressibility, augmentation and color Doppler flow in the
right common femoral vein, right femoral vein and right popliteal
vein. The right saphenofemoral junction is patent. Right profunda
femoral vein is patent without thrombus. Visualized right deep calf
veins are patent without thrombus.

Right great saphenous vein is compressible without thrombus.
IMPRESSION: Negative for deep venous thrombosis in right lower extremity.

## 2018-07-14 ENCOUNTER — Other Ambulatory Visit: Payer: Self-pay | Admitting: Internal Medicine

## 2018-07-20 DIAGNOSIS — H353121 Nonexudative age-related macular degeneration, left eye, early dry stage: Secondary | ICD-10-CM | POA: Diagnosis not present

## 2018-07-20 DIAGNOSIS — H52203 Unspecified astigmatism, bilateral: Secondary | ICD-10-CM | POA: Diagnosis not present

## 2018-07-20 DIAGNOSIS — H04223 Epiphora due to insufficient drainage, bilateral lacrimal glands: Secondary | ICD-10-CM | POA: Diagnosis not present

## 2018-07-20 DIAGNOSIS — Z0100 Encounter for examination of eyes and vision without abnormal findings: Secondary | ICD-10-CM | POA: Diagnosis not present

## 2018-07-20 DIAGNOSIS — H25813 Combined forms of age-related cataract, bilateral: Secondary | ICD-10-CM | POA: Diagnosis not present

## 2018-07-29 DIAGNOSIS — M542 Cervicalgia: Secondary | ICD-10-CM | POA: Diagnosis not present

## 2018-07-29 DIAGNOSIS — M9903 Segmental and somatic dysfunction of lumbar region: Secondary | ICD-10-CM | POA: Diagnosis not present

## 2018-07-29 DIAGNOSIS — M5417 Radiculopathy, lumbosacral region: Secondary | ICD-10-CM | POA: Diagnosis not present

## 2018-07-29 DIAGNOSIS — M9901 Segmental and somatic dysfunction of cervical region: Secondary | ICD-10-CM | POA: Diagnosis not present

## 2018-07-29 DIAGNOSIS — M62830 Muscle spasm of back: Secondary | ICD-10-CM | POA: Diagnosis not present

## 2018-08-13 ENCOUNTER — Other Ambulatory Visit: Payer: Self-pay | Admitting: Internal Medicine

## 2018-08-24 DIAGNOSIS — M5417 Radiculopathy, lumbosacral region: Secondary | ICD-10-CM | POA: Diagnosis not present

## 2018-08-24 DIAGNOSIS — M9903 Segmental and somatic dysfunction of lumbar region: Secondary | ICD-10-CM | POA: Diagnosis not present

## 2018-08-24 DIAGNOSIS — M62830 Muscle spasm of back: Secondary | ICD-10-CM | POA: Diagnosis not present

## 2018-08-24 DIAGNOSIS — M542 Cervicalgia: Secondary | ICD-10-CM | POA: Diagnosis not present

## 2018-08-24 DIAGNOSIS — M9901 Segmental and somatic dysfunction of cervical region: Secondary | ICD-10-CM | POA: Diagnosis not present

## 2018-08-26 ENCOUNTER — Ambulatory Visit (INDEPENDENT_AMBULATORY_CARE_PROVIDER_SITE_OTHER): Payer: Medicare HMO

## 2018-08-26 DIAGNOSIS — Z23 Encounter for immunization: Secondary | ICD-10-CM

## 2018-08-27 ENCOUNTER — Other Ambulatory Visit: Payer: Self-pay | Admitting: Internal Medicine

## 2018-09-10 ENCOUNTER — Other Ambulatory Visit: Payer: Self-pay | Admitting: Internal Medicine

## 2018-09-10 DIAGNOSIS — I10 Essential (primary) hypertension: Secondary | ICD-10-CM

## 2018-09-17 ENCOUNTER — Other Ambulatory Visit: Payer: Self-pay | Admitting: Internal Medicine

## 2018-11-10 ENCOUNTER — Telehealth: Payer: Self-pay

## 2018-11-10 NOTE — Telephone Encounter (Signed)
° °  [facility] Pre-operative Risk Assessment    Request for surgical clearance:  1. What type of surgery is being performed? Colonoscopy   2. When is this surgery scheduled? 01/26/2019   3. What type of clearance is required (medical clearance vs. Pharmacy clearance to hold med vs. Both)? Pharmacy  4. Are there any medications that need to be held prior to surgery and how long? Stop Eliquis and ASA 7 days prior   5. Practice name and name of physician performing surgery?         Dr.Jeffrey Medoff   6. What is your office phone number [phone]    7.   What is your office fax number [phone]   8.   Anesthesia type (None, local, MAC, general) ?  ...Marland KitchenMarland Kitchen

## 2018-11-11 NOTE — Telephone Encounter (Signed)
   Primary Cardiologist: Jenkins Rouge, MD  Chart reviewed as part of pre-operative protocol coverage. Pharmacy to review anticoagulation.   Hx of CAD s/p CABG in 2010 (LIMA to LAD and SVG to PDA/PLB)  Dr. Johnsie Cancel, can patient hold ASA for 7 days prior?  [place], [place] 11/11/2018, 2:21 PM

## 2018-11-11 NOTE — Telephone Encounter (Signed)
Pt is not on Eliquis and has never taken any anticoagulants per Epic records back to 2012.

## 2018-11-11 NOTE — Telephone Encounter (Signed)
Yes ok to hold ASA 

## 2018-11-13 NOTE — Telephone Encounter (Signed)
   Primary Cardiologist: Jenkins Rouge, MD  Chart reviewed as part of pre-operative protocol coverage. Per Dr. Johnsie Cancel, patient may hold aspirin 7 days prior to upcoming surgery scheduled for 01/26/19. Patient is not on eliquis and has no history of being on an anticoagulant dating back to 2012 per pharmacy chart review.   I will route this recommendation to the requesting party via Epic fax function and remove from pre-op pool.  Please call with questions.  Abigail Butts, PA-C 11/13/2018, 4:07 PM

## 2018-11-26 ENCOUNTER — Encounter: Payer: Self-pay | Admitting: Pulmonary Disease

## 2018-11-26 ENCOUNTER — Ambulatory Visit: Payer: Medicare HMO | Admitting: Pulmonary Disease

## 2018-11-26 DIAGNOSIS — Z9989 Dependence on other enabling machines and devices: Secondary | ICD-10-CM | POA: Diagnosis not present

## 2018-11-26 DIAGNOSIS — G4733 Obstructive sleep apnea (adult) (pediatric): Secondary | ICD-10-CM | POA: Diagnosis not present

## 2018-11-26 DIAGNOSIS — E669 Obesity, unspecified: Secondary | ICD-10-CM

## 2018-11-26 NOTE — Patient Instructions (Signed)
CPAP is working well 

## 2018-11-26 NOTE — Assessment & Plan Note (Signed)
He will be getting back on his diet

## 2018-11-26 NOTE — Progress Notes (Signed)
   Subjective:    Patient ID: Steven Munoz, male    DOB: 09-Feb-1941, 78 y.o.   MRN: [id number]  HPI  11 y o  for FU of severe obstructive sleep apnea   Chief Complaint  Patient presents with  . Follow-up    83yr f/u for OSA. States machine has been working well. Uses [facility] as his DME.     He had last more than 30 pounds on his last visit in 2019 but seems to have gained it back.  He is starting on his diet again and is planning to lose. CPAP is working well otherwise, denies any problems is very compliant and this is certainly helping his daytime somnolence and fatigue. 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 tests/ events reviewed  PSG (289 Lbs) 2009 - severe OSA with predominant hypopneas, AHI 31/h corrected by CPAP +8 with large quattro mask . PLms  improved with titration  Review of Systems Patient denies significant dyspnea,cough, hemoptysis,  chest pain, palpitations, pedal edema, orthopnea, paroxysmal nocturnal dyspnea, lightheadedness, nausea, vomiting, abdominal or  leg pains      Objective:   Physical Exam  Gen. Pleasant, obese, in no distress ENT - no lesions, no post nasal drip Neck: No JVD, no thyromegaly, no carotid bruits Lungs: no use of accessory muscles, no dullness to percussion, decreased without rales or rhonchi  Cardiovascular: Rhythm regular, heart sounds  normal, no murmurs or gallops, no peripheral edema Musculoskeletal: No deformities, no cyanosis or clubbing , no tremors        Assessment & Plan:

## 2018-11-26 NOTE — Assessment & Plan Note (Signed)
CPAP is working well. Supplies will be renewed for a year.  Weight loss encouraged, compliance with goal of at least 4-6 hrs every night is the expectation. Advised against medications with sedative side effects Cautioned against driving when sleepy - understanding that sleepiness will vary on a day to day basis

## 2018-12-11 DIAGNOSIS — G4733 Obstructive sleep apnea (adult) (pediatric): Secondary | ICD-10-CM | POA: Diagnosis not present

## 2018-12-22 ENCOUNTER — Ambulatory Visit (INDEPENDENT_AMBULATORY_CARE_PROVIDER_SITE_OTHER): Payer: Medicare HMO | Admitting: Internal Medicine

## 2018-12-22 ENCOUNTER — Encounter: Payer: Self-pay | Admitting: Internal Medicine

## 2018-12-22 VITALS — BP 136/78 | HR 48 | Temp 97.4°F | Resp 18 | Ht 72.0 in | Wt 281.2 lb

## 2018-12-22 DIAGNOSIS — R399 Unspecified symptoms and signs involving the genitourinary system: Secondary | ICD-10-CM | POA: Diagnosis not present

## 2018-12-22 DIAGNOSIS — E785 Hyperlipidemia, unspecified: Secondary | ICD-10-CM

## 2018-12-22 DIAGNOSIS — I10 Essential (primary) hypertension: Secondary | ICD-10-CM | POA: Diagnosis not present

## 2018-12-22 DIAGNOSIS — R351 Nocturia: Secondary | ICD-10-CM

## 2018-12-22 DIAGNOSIS — E039 Hypothyroidism, unspecified: Secondary | ICD-10-CM

## 2018-12-22 LAB — URINALYSIS, ROUTINE W REFLEX MICROSCOPIC
BILIRUBIN URINE: NEGATIVE
Hgb urine dipstick: NEGATIVE
KETONES UR: NEGATIVE
Leukocytes,Ua: NEGATIVE
Nitrite: NEGATIVE
Specific Gravity, Urine: 1.02 (ref 1.000–1.030)
Total Protein, Urine: NEGATIVE
UROBILINOGEN UA: 0.2 (ref 0.0–1.0)
Urine Glucose: NEGATIVE
pH: 6.5 (ref 5.0–8.0)

## 2018-12-22 LAB — BASIC METABOLIC PANEL
BUN: 20 mg/dL (ref 6–23)
CO2: 31 meq/L (ref 19–32)
CREATININE: 0.88 mg/dL (ref 0.40–1.50)
Calcium: 9.4 mg/dL (ref 8.4–10.5)
Chloride: 101 mEq/L (ref 96–112)
GFR: 83.74 mL/min (ref >60.00)
GLUCOSE: 103 mg/dL — AB (ref 70–99)
Potassium: 4 mEq/L (ref 3.5–5.1)
Sodium: 140 mEq/L (ref 135–145)

## 2018-12-22 LAB — PSA: PSA: 1.36 ng/mL (ref 0.10–4.00)

## 2018-12-22 LAB — TSH: TSH: 3.81 u[IU]/mL (ref 0.35–4.50)

## 2018-12-22 MED ORDER — TAMSULOSIN HCL 0.4 MG PO CAPS: 0.4000 mg | ORAL_CAPSULE | Freq: Every day | ORAL | 6 refills | Status: DC

## 2018-12-22 NOTE — Assessment & Plan Note (Signed)
HTN: seems controlled on HCTZ.  Losartan, metoprolol.  Check a BMP High cholesterol: Well-controlled on Crestor, last FLP and LFTs normal. Hypothyroidism: Continue Synthroid, check TSH L UTS: Chronic symptoms, complain of nocturia 1-2 times, at this point he requests treatment.  Prostate cancer screening is not indicated however I think is prudent to do a DRE and labs before initiation of treatment.  DRE was normal.  Check UA, PSA.  Reports use Flomax remotely, side effects?  We agreed to retry.  Prescription sent. Preventive care: To have a colonoscopy 12-2018. Also take multiple supplements, recommend to stay on PreserVision and multivitamin.  Other supplements  only if he has some  objective benefits. RTC 6 months.

## 2018-12-22 NOTE — Patient Instructions (Addendum)
Please schedule Medicare Wellness with Glenard Haring.   GO TO THE LAB : Get the blood work     GO TO THE FRONT DESK Schedule your next appointment   for a physical exam in 6 months  Start taking tamsulosin (Flomax) 1 time at bedtime to help with the prostate.  Call if side effects.

## 2018-12-22 NOTE — Progress Notes (Signed)
Subjective:    Patient ID: Steven Munoz, male    DOB: 08-29-1941, 78 y.o.   MRN: [id number]  DOS:  12/22/2018 Type of visit - description: Routine visit Medication list reviewed Complaining of pain at the joints of his hands, thinks is DJD. Also long history of nocturia, wakes up 1 or 2 times at night.  Request treatment.  Review of Systems Denies abdominal pain, nausea or vomiting. No dysuria, gross hematuria or difficulty urinating.  Past Medical History:  Diagnosis Date  . CAD (coronary artery disease) CABG  05/29/09    OPERATIVE PROCEDURES:  Median sternotomy, extracorporeal circulation,   . Colonic polyp 2002, 2005, 2009   [name]  . Dermatophytosis of nail   . Diverticulosis   . Dysmetabolic syndrome   . GERD (gastroesophageal reflux disease)    S/P dilation  2005  . Gout   . Hyperlipidemia   . Hypertension   . Hypothyroidism   . Murmur   . Nephrolithiasis    X 1  . Pneumonia age 89   treated as inpatient  . Sleep apnea    on CPAP; Dr [name]    Past Surgical History:  Procedure Laterality Date  . athroscopy     right knee X2, left knee X1  . COLONOSCOPY  2002 & 2009 &2014   polypectomy; [name]  . CORONARY ARTERY BYPASS GRAFT  2010   Median sternotomy, extracorporeal circulation, coronary bypass graft surgery x3 using a left internal mammary artery graft to left anterior descending coronary artery, with a sequential  saphenous vein graft to posterior descending and posterolateral branches  of the right coronary artery.  Endoscopic vein harvesting from the right  . Nail Alvusion Bilateral    Hallux  . NOSE SURGERY  11/20/12   L max antrectomy;septoplasty;turbinate reduction. [name]  . REPLACEMENT TOTAL KNEE     right 2003  . REPLACEMENT TOTAL KNEE     partial Left March 2009  . TENDON RELEASE Right 03/2016   from R hip. @ [name]  . TOTAL HIP ARTHROPLASTY  02/22/2014   right hip; Dr [facility]    Social History   Socioeconomic  History  . Marital status: Married    Spouse name: Not on file  . Number of children: 2  . Years of education: Not on file  . Highest education level: Not on file  Occupational History  . 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 Financial resource strain: Not on file  . Food insecurity:    Worry: Not on file    Inability: Not on file  . Transportation needs:    Medical: Not on file    Non-medical: Not on file  Tobacco Use  . Smoking status: Former Smoker    Packs/day: 1.00    Years: 6.00    Pack years: 6.00    Types: Cigarettes    Last attempt to quit: 10/28/1966    Years since quitting: 52.1  . Smokeless tobacco: Never Used  . Tobacco comment: smoked [place], up to < 1 ppd  Substance and Sexual Activity  . Alcohol use: Yes    Alcohol/week: 3.0 standard drinks    Types: 3 Glasses of wine per week    Comment: socially  . Drug use: No  . Sexual activity: Not on file  Lifestyle  . Physical activity:    Days per week: Not on file    Minutes  per session: Not on file  . Stress: Not on file  Relationships  . Social connections:    Talks on phone: Not on file    Gets together: Not on file    Attends religious service: Not on file    Active member of club or organization: Not on file    Attends meetings of clubs or organizations: Not on file    Relationship status: Not on file  . Intimate partner violence:    Fear of current or ex partner: Not on file    Emotionally abused: Not on file    Physically abused: Not on file    Forced sexual activity: Not on file  Other Topics Concern  . Not on file  Social History Narrative   Socially; lives with spouse   Children 2; dtr lives in [place], son lives [place]   3 grandchildren       Allergies as of 12/22/2018      Reactions   Povidone-iodine    REACTION: rash locally   Ofloxacin    Affected sleep with eye drop formulation   Tape Rash   Cloth adhesive tape-per  patient Cloth adhesive tape-per patient Cloth adhesive tape-per patient      Medication List       Accurate as of December 22, 2018  6:31 PM. Always use your most recent med list.        aspirin EC 81 MG tablet Take 81 mg by mouth daily.   CENTRUM SILVER PO Take 1 tablet by mouth daily.   PRESERVISION AREDS 2+MULTI VIT Caps Take by mouth.   CoQ10 200 MG Caps Take by mouth.   hydrochlorothiazide 25 MG tablet Commonly known as:  HYDRODIURIL Take 1 tablet (25 mg total) by mouth daily.   levothyroxine 50 MCG tablet Commonly known as:  SYNTHROID, LEVOTHROID Take 1 tablet (50 mcg total) by mouth daily before breakfast.   losartan 100 MG tablet Commonly known as:  COZAAR Take 1 tablet (100 mg total) by mouth daily.   Magnesium 400 MG Caps Take 400 mg by mouth daily.   metoprolol tartrate 25 MG tablet Commonly known as:  LOPRESSOR Take 0.5 tablets (12.5 mg total) by mouth daily.   OMEGA 3 500 PO Take 500 mg by mouth daily.   omeprazole 20 MG capsule Commonly known as:  PRILOSEC Take 1 capsule (20 mg total) by mouth daily.   OSTEO BI-FLEX REGULAR STRENGTH PO Take 2 tablets by mouth daily.   rosuvastatin 20 MG tablet Commonly known as:  CRESTOR Take 1 tablet (20 mg total) by mouth daily.   tamsulosin 0.4 MG Caps capsule Commonly known as:  FLOMAX Take 1 capsule (0.4 mg total) by mouth daily after supper.   TURMERIC PO Take by mouth as directed.           Objective:   Physical Exam BP 136/78 (BP Location: Left Arm, Patient Position: Sitting, Cuff Size: Normal)   Pulse (!) 48   Temp (!) 97.4 F (36.3 C) (Oral)   Resp 18   Ht 6' (1.829 m)   Wt 281 lb 4 oz (127.6 kg)   SpO2 95%   BMI 38.14 kg/m  General:   Well developed, NAD, BMI noted.  HEENT:  Normocephalic . Face symmetric, atraumatic Lungs:  CTA B Normal respiratory effort, no intercostal retractions, no accessory muscle use. Heart: RRR,  no murmur.  no pretibial edema bilaterally   Abdomen:  Not distended, soft, non-tender. No rebound or rigidity.  Skin: Not pale. Not jaundice DRE: Normal sphincter tone, no stools.  Prostate slightly enlarged, not tender or nodular Neurologic:  alert & oriented X3.  Speech normal, gait appropriate for age and unassisted Psych--  Cognition and judgment appear intact.  Cooperative with normal attention span and concentration.  Behavior appropriate. No anxious or depressed appearing.     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  Losartan, metoprolol.  Check a BMP High cholesterol: Well-controlled on Crestor, last FLP and LFTs normal. Hypothyroidism: Continue Synthroid, check TSH L UTS: Chronic symptoms, complain of nocturia 1-2 times, at this point he requests treatment.  Prostate cancer screening is not indicated however I think is prudent to do a DRE and labs before initiation of treatment.  DRE was normal.  Check UA, PSA.  Reports use Flomax remotely, side effects?  We agreed to retry.  Prescription sent. Preventive care: To have a colonoscopy 12-2018. Also take multiple supplements, recommend to stay on PreserVision and multivitamin.  Other supplements  only if he has some  objective benefits. RTC 6 months.

## 2018-12-22 NOTE — Progress Notes (Signed)
Pre visit review using our clinic review tool, if applicable. No additional management support is needed unless otherwise documented below in the visit note. 

## 2018-12-23 ENCOUNTER — Ambulatory Visit: Payer: Medicare HMO | Admitting: Internal Medicine

## 2018-12-31 DIAGNOSIS — R69 Illness, unspecified: Secondary | ICD-10-CM | POA: Diagnosis not present

## 2019-02-08 ENCOUNTER — Other Ambulatory Visit: Payer: Self-pay | Admitting: Internal Medicine

## 2019-03-01 ENCOUNTER — Other Ambulatory Visit: Payer: Self-pay | Admitting: Internal Medicine

## 2019-03-01 DIAGNOSIS — I10 Essential (primary) hypertension: Secondary | ICD-10-CM

## 2019-04-09 ENCOUNTER — Other Ambulatory Visit: Payer: Self-pay | Admitting: Internal Medicine

## 2019-04-19 DIAGNOSIS — H04221 Epiphora due to insufficient drainage, right lacrimal gland: Secondary | ICD-10-CM | POA: Diagnosis not present

## 2019-04-19 DIAGNOSIS — H04222 Epiphora due to insufficient drainage, left lacrimal gland: Secondary | ICD-10-CM | POA: Diagnosis not present

## 2019-05-07 DIAGNOSIS — H57813 Brow ptosis, bilateral: Secondary | ICD-10-CM | POA: Diagnosis not present

## 2019-05-07 DIAGNOSIS — H04553 Acquired stenosis of bilateral nasolacrimal duct: Secondary | ICD-10-CM | POA: Diagnosis not present

## 2019-05-07 DIAGNOSIS — H0235 Blepharochalasis left lower eyelid: Secondary | ICD-10-CM | POA: Diagnosis not present

## 2019-05-07 DIAGNOSIS — H53483 Generalized contraction of visual field, bilateral: Secondary | ICD-10-CM | POA: Diagnosis not present

## 2019-05-07 DIAGNOSIS — H02423 Myogenic ptosis of bilateral eyelids: Secondary | ICD-10-CM | POA: Diagnosis not present

## 2019-05-07 DIAGNOSIS — H0234 Blepharochalasis left upper eyelid: Secondary | ICD-10-CM | POA: Diagnosis not present

## 2019-05-07 DIAGNOSIS — H04223 Epiphora due to insufficient drainage, bilateral lacrimal glands: Secondary | ICD-10-CM | POA: Diagnosis not present

## 2019-05-07 DIAGNOSIS — Z8669 Personal history of other diseases of the nervous system and sense organs: Secondary | ICD-10-CM | POA: Diagnosis not present

## 2019-05-07 DIAGNOSIS — H0232 Blepharochalasis right lower eyelid: Secondary | ICD-10-CM | POA: Diagnosis not present

## 2019-05-07 DIAGNOSIS — H0231 Blepharochalasis right upper eyelid: Secondary | ICD-10-CM | POA: Diagnosis not present

## 2019-06-11 ENCOUNTER — Other Ambulatory Visit: Payer: Self-pay | Admitting: Internal Medicine

## 2019-06-15 ENCOUNTER — Telehealth: Payer: Self-pay | Admitting: Cardiovascular Disease

## 2019-06-15 NOTE — Telephone Encounter (Signed)
   [facility] Pre-operative Risk Assessment    Request for surgical clearance:  1. What type of surgery is being performed? Left endoscopic dacryocystorhinostomy with silicone stent insertion   2. When is this surgery scheduled?  06/29/2019   3. What type of clearance is required (medical clearance vs. Pharmacy clearance to hold med vs. Both)?  Both  4. Are there any medications that need to be held prior to surgery and how long? ASA   5. Practice name and name of physician performing surgery?  [facility]   6. What is your office phone number? [phone]    7.   What is your office fax number? [phone]  8.   Anesthesia type (None, local, MAC, general) ? General  _______________________________________________   (provider comments below)

## 2019-06-15 NOTE — Progress Notes (Signed)
Virtual Visit via Video Note   This visit type was conducted due to national recommendations for restrictions regarding the COVID-19 Pandemic (e.g. social distancing) in an effort to limit this patient's exposure and mitigate transmission in our community.  Due to his co-morbid illnesses, this patient is at least at moderate risk for complications without adequate follow up.  This format is felt to be most appropriate for this patient at this time.  All issues noted in this document were discussed and addressed.  A limited physical exam was performed with this format.  Please refer to the patient's chart for his consent to telehealth for [facility]   Date:  06/16/2019   ID:  Steven Munoz, DOB 11-20-1940, MRN [id number]  Patient Location: Home Provider Location: Home  PCP:  Colon Branch, MD  Cardiologist:  Jenkins Rouge, MD   Electrophysiologist:  None   Evaluation Performed:  Follow-Up Visit  Chief Complaint: Surgical clearance  History of Present Illness:    Steven Munoz is a 78 y.o. male with:  Coronary artery disease status post CABG 05/2009  Postoperative atrial fibrillation  Hypertension  Hyperlipidemia  OSA on CPAP  Bradycardia with first-degree AV block  Venous insufficiency  History of Bell's palsy  Steven Munoz was last seen by Dr. Johnsie Cancel August 2019.  He needs a dacryocystorhinostomy and is evaluated today for surgical clearance.  Since last seen, he has done well without symptoms of chest discomfort or shortness of breath.  He has not had syncope or near syncope.  He has not had orthopnea or significant lower extremity swelling.  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  The patient does not have symptoms concerning for COVID-19 infection (fever, chills, cough, or new shortness of breath).    Past Medical History:  Diagnosis Date  . CAD (coronary artery disease) CABG  05/29/09    OPERATIVE PROCEDURES:  Median  sternotomy, extracorporeal circulation,   . Colonic polyp 2002, 2005, 2009   [name]  . Dermatophytosis of nail   . Diverticulosis   . Dysmetabolic syndrome   . GERD (gastroesophageal reflux disease)    S/P dilation  2005  . Gout   . Hyperlipidemia   . Hypertension   . Hypothyroidism   . Murmur   . Nephrolithiasis    X 1  . Pneumonia age 74   treated as inpatient  . Sleep apnea    on CPAP; Dr [name]   Past Surgical History:  Procedure Laterality Date  . athroscopy     right knee X2, left knee X1  . COLONOSCOPY  2002 & 2009 &2014   polypectomy; [name]  . CORONARY ARTERY BYPASS GRAFT  2010   Median sternotomy, extracorporeal circulation, coronary bypass graft surgery x3 using a left internal mammary artery graft to left anterior descending coronary artery, with a sequential  saphenous vein graft to posterior descending and posterolateral branches  of the right coronary artery.  Endoscopic vein harvesting from the right  . Nail Alvusion Bilateral    Hallux  . NOSE SURGERY  11/20/12   L max antrectomy;septoplasty;turbinate reduction. [name]  . REPLACEMENT TOTAL KNEE     right 2003  . REPLACEMENT TOTAL KNEE     partial Left March 2009  . TENDON RELEASE Right 03/2016   from R hip. @ [name]  . TOTAL HIP ARTHROPLASTY  02/22/2014   right hip; Dr [facility]     Current  Meds  Medication Sig  . aspirin EC 81 MG tablet Take 81 mg by mouth daily.  . Coenzyme Q10 (COQ10) 200 MG CAPS Take by mouth.  . Glucosamine-Chondroitin (OSTEO BI-FLEX REGULAR STRENGTH PO) Take 2 tablets by mouth daily.  . hydrochlorothiazide (HYDRODIURIL) 25 MG tablet Take 1 tablet (25 mg total) by mouth daily.  Marland Kitchen levothyroxine (SYNTHROID, LEVOTHROID) 50 MCG tablet Take 1 tablet (50 mcg total) by mouth daily before breakfast.  . losartan (COZAAR) 100 MG tablet Take 1 tablet (100 mg total) by mouth daily.  . Magnesium 400 MG CAPS Take 400 mg by mouth daily.  . metoprolol tartrate (LOPRESSOR) 25  MG tablet Take 0.5 tablets (12.5 mg total) by mouth daily.  . Multiple Vitamins-Minerals (CENTRUM SILVER PO) Take 1 tablet by mouth daily.  . Multiple Vitamins-Minerals (PRESERVISION AREDS 2+MULTI VIT) CAPS Take by mouth.  Marland Kitchen omeprazole (PRILOSEC) 20 MG capsule TAKE 1 CAPSULE DAILY  . rosuvastatin (CRESTOR) 20 MG tablet Take 1 tablet (20 mg total) by mouth daily.  . TURMERIC PO Take by mouth as directed.     Allergies:   Povidone-iodine, Ofloxacin, and Tape   Social History   Tobacco Use  . Smoking status: Former Smoker    Packs/day: 1.00    Years: 6.00    Pack years: 6.00    Types: Cigarettes    Quit date: 10/28/1966    Years since quitting: 52.6  . Smokeless tobacco: Never Used  . Tobacco comment: smoked [place], up to < 1 ppd  Substance Use Topics  . Alcohol use: Yes    Alcohol/week: 3.0 standard drinks    Types: 3 Glasses of wine per week    Comment: socially  . Drug use: No     Family Hx: The patient's family history includes Heart attack (age of onset: 3) in his brother; Heart attack (age of onset: 55) in his father; Hypertension in his mother; Lung cancer in his mother; Transient ischemic attack in his mother. There is no history of Diabetes, Colon cancer, or Prostate cancer.  ROS:   Please see the history of present illness.     All other systems reviewed and are negative.   Prior CV studies:   The following studies were reviewed today:  Echocardiogram 12/08/17 EF 55-60, no RWMA, Gr 1 DD, trivial MR, severe LAE, mild TR   Labs/Other Tests and Data Reviewed:    EKG:  No ECG reviewed.  Recent Labs: 06/22/2018: ALT 16; Hemoglobin 15.4; Platelets 185.0 12/22/2018: BUN 20; Creatinine, Ser 0.88; Potassium 4.0; Sodium 140; TSH 3.81   Recent Lipid Panel Lab Results  Component Value Date/Time   CHOL 138 06/22/2018 09:58 AM   TRIG 110.0 06/22/2018 09:58 AM   HDL 52.70 06/22/2018 09:58 AM   CHOLHDL 3 06/22/2018 09:58 AM   LDLCALC 64 06/22/2018 09:58 AM      Wt Readings from Last 3 Encounters:  06/16/19 278 lb (126.1 kg)  12/22/18 281 lb 4 oz (127.6 kg)  11/26/18 284 lb (128.8 kg)     Objective:    Vital Signs:  BP (!) 143/66   Pulse (!) 54   Ht 6' 0.5" (1.842 m)   Wt 278 lb (126.1 kg)   BMI 37.19 kg/m    VITAL SIGNS:  reviewed GEN:  no acute distress EYES:  sclerae anicteric, EOMI - Extraocular Movements Intact RESPIRATORY:  Normal respiratory effort NEURO:  alert and oriented x 3, no obvious focal deficit PSYCH:  normal affect  ASSESSMENT & PLAN:  1. Preoperative cardiovascular examination The Revised Cardiac Risk Index indicates that his Perioperative Risk of Major Cardiac Event is (%): 0.9.  Therefore, he is at low risk for perioperative complications.  His Functional Capacity in METs is good at 5.6 as indicated by the [name] Activity Status Index (DASI).  According to ACC/AHA guidelines, no further cardiovascular testing needed.  The patient may proceed to surgery at acceptable risk.   He may hold aspirin for 7 days prior to surgery (if needed) and resume postoperatively as soon as it is felt to be safe.  2. Coronary artery disease involving coronary bypass graft of native heart without angina pectoris History of CABG in 2010.  He is very active and does not have any symptoms of angina.  Continue aspirin, statin.  He has a previously scheduled appointment Dr. Johnsie Cancel in September and would like to keep that.  3. Essential hypertension Blood pressure somewhat elevated.  I have asked him to keep an eye on his blood pressure over the next several weeks.  If his pressure remains above target (130/80) he has been asked to contact us or his primary care doctor for further management.  4. Hyperlipidemia, unspecified hyperlipidemia type Continue high intensity statin.  He has follow-up lipids pending in September with his primary care physician.  5. COVID-19 Education: The signs and symptoms of COVID-19 were discussed with the patient  and how to seek care for testing (follow up with PCP or arrange E-visit).  The importance of social distancing was discussed today.  Time:   Today, I have spent 19 minutes with the patient with telehealth technology discussing the above problems.     Medication Adjustments/Labs and Tests Ordered: Current medicines are reviewed at length with the patient today.  Concerns regarding medicines are outlined above.   Tests Ordered: No orders of the defined types were placed in this encounter.   Medication Changes: No orders of the defined types were placed in this encounter.   Follow Up:  In Person 06/2019 w/ Dr. Johnsie Cancel as already scheduled.  Signed, Richardson Dopp, PA-C  06/16/2019 4:33 PM    [facility]

## 2019-06-15 NOTE — Telephone Encounter (Signed)
   Primary Cardiologist:Peter Johnsie Cancel, MD  Chart reviewed as part of pre-operative protocol coverage. Because of Steven Munoz's past medical history and time since last visit, he/she will require a follow-up visit in order to better assess preoperative cardiovascular risk.  Per preoperative request 10/2018, patient can hold aspirin x7 days prior to upcoming surgery if needed given no significant change in cardiac history since that time.   Pre-op covering staff: - Please schedule appointment and call patient to inform them. - Please contact requesting surgeon's office via preferred method (i.e, phone, fax) to inform them of need for appointment prior to surgery.    Abigail Butts, PA-C  06/15/2019, 1:34 PM

## 2019-06-15 NOTE — Telephone Encounter (Signed)
Spoke with patient and scheduled a follow up appt with Steven Munoz for 06/16/2019 at 9:45am. Consent given         Virtual Visit Pre-Appointment Phone Call  "[name], I am calling you today to discuss your upcoming appointment. We are currently trying to limit exposure to the virus that causes COVID-19 by seeing patients at home rather than in the office."  1. "What is the BEST phone number to call the day of the visit?" - include this in appointment notes  2. "Do you have or have access to (through a family member/friend) a smartphone with video capability that we can use for your visit?" a. If yes - list this number in appt notes as "cell" (if different from BEST phone #) and list the appointment type as a VIDEO visit in appointment notes b. If no - list the appointment type as a PHONE visit in appointment notes  3. Confirm consent - "In the setting of the current Covid19 crisis, you are scheduled for a VIDEO visit with Steven Lipps, PA on 06/16/2019 at 9:45am.  Just as we do with many in-office visits, in order for you to participate in this visit, we must obtain consent.  If you'd like, I can send this to your mychart (if signed up) or email for you to review.  Otherwise, I can obtain your verbal consent now.  All virtual visits are billed to your insurance company just like a normal visit would be.  By agreeing to a virtual visit, we'd like you to understand that the technology does not allow for your provider to perform an examination, and thus may limit your provider's ability to fully assess your condition. If your provider identifies any concerns that need to be evaluated in person, we will make arrangements to do so.  Finally, though the technology is pretty good, we cannot assure that it will always work on either your or our end, and in the setting of a video visit, we may have to convert it to a phone-only visit.  In either situation, we cannot ensure that we have a secure  connection.  Are you willing to proceed?" STAFF: Did the patient verbally acknowledge consent to telehealth visit? Document YES/NO here: YES  4. Advise patient to be prepared - "Two hours prior to your appointment, go ahead and check your blood pressure, pulse, oxygen saturation, and your weight (if you have the equipment to check those) and write them all down. When your visit starts, your provider will ask you for this information. If you have an Apple Watch or Kardia device, please plan to have heart rate information ready on the day of your appointment. Please have a pen and paper handy nearby the day of the visit as well."  5. Give patient instructions for MyChart download to smartphone OR Doximity/[email or web address] as below if video visit (depending on what platform provider is using)  6. Inform patient they will receive a phone call 15 minutes prior to their appointment time (may be from unknown caller ID) so they should be prepared to answer    TELEPHONE CALL NOTE  Steven Munoz has been deemed a candidate for a follow-up tele-health visit to limit community exposure during the Covid-19 pandemic. I spoke with the patient via phone to ensure availability of phone/video source, confirm preferred email & phone number, and discuss instructions and expectations.  I reminded Steven Munoz to be prepared with any vital sign and/or heart rhythm information that  could potentially be obtained via home monitoring, at the time of his visit. I reminded Steven Munoz to expect a phone call prior to his visit.  Harold Hedge, CMA 06/15/2019 3:34 PM   INSTRUCTIONS FOR DOWNLOADING THE MYCHART APP TO SMARTPHONE  - The patient must first make sure to have activated MyChart and know their login information - If Apple, go to [employer] and type in MyChart in the search bar and download the app. If Android, ask patient to go to [employer] and type in [place] in the search bar and download the app. The  app is free but as with any other app downloads, their phone may require them to verify saved payment information or Apple/Android password.  - The patient will need to then log into the app with their MyChart username and password, and select [facility] as their healthcare provider to link the account. When it is time for your visit, go to the MyChart app, find appointments, and click Begin Video Visit. Be sure to Select Allow for your device to access the Microphone and Camera for your visit. You will then be connected, and your provider will be with you shortly.  **If they have any issues connecting, or need assistance please contact MyChart service desk [phone])**  **If using a computer, in order to ensure the best quality for their visit they will need to use either of the following Internet Browsers: [employer], or Google Chrome**  IF USING DOXIMITY or DOXY.ME - The patient will receive a link just prior to their visit by text.     FULL LENGTH CONSENT FOR TELE-HEALTH VISIT   I hereby voluntarily request, consent and authorize [name] and its employed or contracted physicians, physician assistants, nurse practitioners or other licensed health care professionals (the Practitioner), to provide me with telemedicine health care services (the "Services") as deemed necessary by the treating Practitioner. I acknowledge and consent to receive the Services by the Practitioner via telemedicine. I understand that the telemedicine visit will involve communicating with the Practitioner through live audiovisual communication technology and the disclosure of certain medical information by electronic transmission. I acknowledge that I have been given the opportunity to request an in-person assessment or other available alternative prior to the telemedicine visit and am voluntarily participating in the telemedicine visit.  I understand that I have the right to withhold or withdraw  my consent to the use of telemedicine in the course of my care at any time, without affecting my right to future care or treatment, and that the Practitioner or I may terminate the telemedicine visit at any time. I understand that I have the right to inspect all information obtained and/or recorded in the course of the telemedicine visit and may receive copies of available information for a reasonable fee.  I understand that some of the potential risks of receiving the Services via telemedicine include:  Marland Kitchen Delay or interruption in medical evaluation due to technological equipment failure or disruption; . Information transmitted may not be sufficient (e.g. poor resolution of images) to allow for appropriate medical decision making by the Practitioner; and/or  . In rare instances, security protocols could fail, causing a breach of personal health information.  Furthermore, I acknowledge that it is my responsibility to provide information about my medical history, conditions and care that is complete and accurate to the best of my ability. I acknowledge that Practitioner's advice, recommendations, and/or decision may be based on factors not  within their control, such as incomplete or inaccurate data provided by me or distortions of diagnostic images or specimens that may result from electronic transmissions. I understand that the practice of medicine is not an exact science and that Practitioner makes no warranties or guarantees regarding treatment outcomes. I acknowledge that I will receive a copy of this consent concurrently upon execution via email to the email address I last provided but may also request a printed copy by calling the office of [place].    I understand that my insurance will be billed for this visit.   I have read or had this consent read to me. . I understand the contents of this consent, which adequately explains the benefits and risks of the Services being provided via  telemedicine.  . I have been provided ample opportunity to ask questions regarding this consent and the Services and have had my questions answered to my satisfaction. . I give my informed consent for the services to be provided through the use of telemedicine in my medical care  By participating in this telemedicine visit I agree to the above.

## 2019-06-16 ENCOUNTER — Encounter: Payer: Self-pay | Admitting: Physician Assistant

## 2019-06-16 ENCOUNTER — Telehealth (INDEPENDENT_AMBULATORY_CARE_PROVIDER_SITE_OTHER): Payer: Medicare HMO | Admitting: Physician Assistant

## 2019-06-16 ENCOUNTER — Other Ambulatory Visit: Payer: Self-pay

## 2019-06-16 VITALS — BP 143/66 | HR 54 | Ht 72.5 in | Wt 278.0 lb

## 2019-06-16 DIAGNOSIS — Z7189 Other specified counseling: Secondary | ICD-10-CM

## 2019-06-16 DIAGNOSIS — Z01810 Encounter for preprocedural cardiovascular examination: Secondary | ICD-10-CM

## 2019-06-16 DIAGNOSIS — I10 Essential (primary) hypertension: Secondary | ICD-10-CM

## 2019-06-16 DIAGNOSIS — I25810 Atherosclerosis of coronary artery bypass graft(s) without angina pectoris: Secondary | ICD-10-CM

## 2019-06-16 DIAGNOSIS — E785 Hyperlipidemia, unspecified: Secondary | ICD-10-CM | POA: Diagnosis not present

## 2019-06-16 NOTE — Patient Instructions (Signed)
Medication Instructions:   Your physician recommends that you continue on your current medications as directed. Please refer to the Current Medication list given to you today.  If you need a refill on your cardiac medications before your next appointment, please call your pharmacy.   Lab work:  None ordered today  If you have labs (blood work) drawn today and your tests are completely normal, you will receive your results only by: Marland Kitchen MyChart Message (if you have MyChart) OR . A paper copy in the mail If you have any lab test that is abnormal or we need to change your treatment, we will call you to review the results.  Testing/Procedures:  None ordered today  Follow-Up:  Keep your follow up on 07/26/19 at 3:30PM with Dr. Jenkins Rouge.  Any Other Special Instructions Will Be Listed Below (If Applicable).  Check your blood pressure daily for a few weeks and to call us if your readings are consistently over 130/80.

## 2019-06-29 DIAGNOSIS — H57813 Brow ptosis, bilateral: Secondary | ICD-10-CM | POA: Diagnosis not present

## 2019-06-29 DIAGNOSIS — H04222 Epiphora due to insufficient drainage, left lacrimal gland: Secondary | ICD-10-CM | POA: Diagnosis not present

## 2019-06-29 DIAGNOSIS — H0235 Blepharochalasis left lower eyelid: Secondary | ICD-10-CM | POA: Diagnosis not present

## 2019-06-29 DIAGNOSIS — D23111 Other benign neoplasm of skin of right upper eyelid, including canthus: Secondary | ICD-10-CM | POA: Diagnosis not present

## 2019-06-29 DIAGNOSIS — H02423 Myogenic ptosis of bilateral eyelids: Secondary | ICD-10-CM | POA: Diagnosis not present

## 2019-06-29 DIAGNOSIS — L821 Other seborrheic keratosis: Secondary | ICD-10-CM | POA: Diagnosis not present

## 2019-06-29 DIAGNOSIS — D23121 Other benign neoplasm of skin of left upper eyelid, including canthus: Secondary | ICD-10-CM | POA: Diagnosis not present

## 2019-06-29 DIAGNOSIS — H04412 Chronic dacryocystitis of left lacrimal passage: Secondary | ICD-10-CM | POA: Diagnosis not present

## 2019-06-29 DIAGNOSIS — H53483 Generalized contraction of visual field, bilateral: Secondary | ICD-10-CM | POA: Diagnosis not present

## 2019-06-29 DIAGNOSIS — J322 Chronic ethmoidal sinusitis: Secondary | ICD-10-CM | POA: Diagnosis not present

## 2019-06-29 DIAGNOSIS — H04552 Acquired stenosis of left nasolacrimal duct: Secondary | ICD-10-CM | POA: Diagnosis not present

## 2019-06-29 DIAGNOSIS — H04553 Acquired stenosis of bilateral nasolacrimal duct: Secondary | ICD-10-CM | POA: Diagnosis not present

## 2019-06-29 DIAGNOSIS — Z8669 Personal history of other diseases of the nervous system and sense organs: Secondary | ICD-10-CM | POA: Diagnosis not present

## 2019-07-06 ENCOUNTER — Other Ambulatory Visit: Payer: Self-pay

## 2019-07-06 ENCOUNTER — Ambulatory Visit (INDEPENDENT_AMBULATORY_CARE_PROVIDER_SITE_OTHER): Payer: Medicare HMO | Admitting: Internal Medicine

## 2019-07-06 ENCOUNTER — Encounter: Payer: Self-pay | Admitting: Internal Medicine

## 2019-07-06 VITALS — BP 154/58 | HR 46 | Temp 97.5°F | Resp 18 | Ht 73.0 in | Wt 282.4 lb

## 2019-07-06 DIAGNOSIS — E669 Obesity, unspecified: Secondary | ICD-10-CM

## 2019-07-06 DIAGNOSIS — Z23 Encounter for immunization: Secondary | ICD-10-CM

## 2019-07-06 DIAGNOSIS — R739 Hyperglycemia, unspecified: Secondary | ICD-10-CM | POA: Diagnosis not present

## 2019-07-06 DIAGNOSIS — Z0000 Encounter for general adult medical examination without abnormal findings: Secondary | ICD-10-CM | POA: Diagnosis not present

## 2019-07-06 DIAGNOSIS — E039 Hypothyroidism, unspecified: Secondary | ICD-10-CM | POA: Diagnosis not present

## 2019-07-06 DIAGNOSIS — E876 Hypokalemia: Secondary | ICD-10-CM | POA: Diagnosis not present

## 2019-07-06 DIAGNOSIS — E785 Hyperlipidemia, unspecified: Secondary | ICD-10-CM

## 2019-07-06 LAB — CBC WITH DIFFERENTIAL/PLATELET
Basophils Absolute: 0 10*3/uL (ref 0.0–0.1)
Basophils Relative: 0.7 % (ref 0.0–3.0)
Eosinophils Absolute: 0.1 10*3/uL (ref 0.0–0.7)
Eosinophils Relative: 2.6 % (ref 0.0–5.0)
HCT: 42.2 % (ref 39.0–52.0)
Hemoglobin: 14.6 g/dL (ref 13.0–17.0)
Lymphocytes Relative: 34.3 % (ref 12.0–46.0)
Lymphs Abs: 1.9 10*3/uL (ref 0.7–4.0)
MCHC: 34.5 g/dL (ref 30.0–36.0)
MCV: 94.4 fl (ref 78.0–100.0)
Monocytes Absolute: 0.4 10*3/uL (ref 0.1–1.0)
Monocytes Relative: 7.3 % (ref 3.0–12.0)
Neutro Abs: 3 10*3/uL (ref 1.4–7.7)
Neutrophils Relative %: 55.1 % (ref 43.0–77.0)
Platelets: 178 10*3/uL (ref 150.0–400.0)
RBC: 4.47 Mil/uL (ref 4.22–5.81)
RDW: 13.1 % (ref 11.5–15.5)
WBC: 5.4 10*3/uL (ref 4.0–10.5)

## 2019-07-06 LAB — LIPID PANEL
Cholesterol: 115 mg/dL (ref 0–200)
HDL: 39.3 mg/dL (ref >39.00)
LDL Cholesterol: 55 mg/dL (ref 0–99)
NonHDL: 76.16
Total CHOL/HDL Ratio: 3
Triglycerides: 104 mg/dL (ref 0.0–149.0)
VLDL: 20.8 mg/dL (ref 0.0–40.0)

## 2019-07-06 LAB — COMPREHENSIVE METABOLIC PANEL
ALT: 16 U/L (ref 0–53)
AST: 17 U/L (ref 0–37)
Albumin: 4.3 g/dL (ref 3.5–5.2)
Alkaline Phosphatase: 63 U/L (ref 39–117)
BUN: 15 mg/dL (ref 6–23)
CO2: 32 mEq/L (ref 19–32)
Calcium: 9.5 mg/dL (ref 8.4–10.5)
Chloride: 101 mEq/L (ref 96–112)
Creatinine, Ser: 0.89 mg/dL (ref 0.40–1.50)
GFR: 82.54 mL/min (ref >60.00)
Glucose, Bld: 105 mg/dL — ABNORMAL HIGH (ref 70–99)
Potassium: 3.3 mEq/L — ABNORMAL LOW (ref 3.5–5.1)
Sodium: 143 mEq/L (ref 135–145)
Total Bilirubin: 1.2 mg/dL (ref 0.2–1.2)
Total Protein: 6.9 g/dL (ref 6.0–8.3)

## 2019-07-06 LAB — TSH: TSH: 2.05 u[IU]/mL (ref 0.35–4.50)

## 2019-07-06 LAB — HEMOGLOBIN A1C: Hgb A1c MFr Bld: 5.7 % (ref 4.6–6.5)

## 2019-07-06 NOTE — Progress Notes (Signed)
Pre visit review using our clinic review tool, if applicable. No additional management support is needed unless otherwise documented below in the visit note. 

## 2019-07-06 NOTE — Assessment & Plan Note (Addendum)
-  Td 2011 -pnm 23- 2012 - prevnar - 2016 -  zostavax 05-2016 (had Bell's Palsy shortly after it,  avoid shingrix for now) - flu shot today  -CCS: multiple cscopes, last 08-2013 Dr Cher Nakai; had a cscope scheduled, canceled d/t the pandemia, will reschedule in few months -Last PSA was 2020  checked d/t LUTS it was normal, no further screening  -Discussed diet and exercise.  If he needs a letter allowing him to go back to the gym I will write it. -Labs: CMP, FLP, CBC, A1c, TSH

## 2019-07-06 NOTE — Progress Notes (Signed)
Subjective:    Patient ID: Steven Munoz, male    DOB: [birth year], 78 y.o.   MRN: [id number]  DOS:  07/06/2019 Type of visit - description: CPX In general feels well. Admits that he is not eating as healthy as before and is exercising less. Still has nocturia, very mild   Review of Systems  Other than above, a 14 point review of systems is negative    Past Medical History:  Diagnosis Date  . CAD (coronary artery disease) CABG  05/29/09    OPERATIVE PROCEDURES:  Median sternotomy, extracorporeal circulation,   . Colonic polyp 2002, 2005, 2009   [name]  . Dermatophytosis of nail   . Diverticulosis   . Dysmetabolic syndrome   . GERD (gastroesophageal reflux disease)    S/P dilation  2005  . Gout   . Hyperlipidemia   . Hypertension   . Hypothyroidism   . Murmur   . Nephrolithiasis    X 1  . Pneumonia age 39   treated as inpatient  . Sleep apnea    on CPAP; Dr [name]    Past Surgical History:  Procedure Laterality Date  . athroscopy     right knee X2, left knee X1  . COLONOSCOPY  2002 & 2009 &2014   polypectomy; [name]  . CORONARY ARTERY BYPASS GRAFT  2010   Median sternotomy, extracorporeal circulation, coronary bypass graft surgery x3 using a left internal mammary artery graft to left anterior descending coronary artery, with a sequential  saphenous vein graft to posterior descending and posterolateral branches  of the right coronary artery.  Endoscopic vein harvesting from the right  . EYE SURGERY Right    tear duct  . Nail Alvusion Bilateral    Hallux  . NOSE SURGERY  11/20/12   L max antrectomy;septoplasty;turbinate reduction. [name]  . REPLACEMENT TOTAL KNEE     right 2003  . REPLACEMENT TOTAL KNEE     partial Left March 2009  . TENDON RELEASE Right 03/2016   from R hip. @ [name]  . TOTAL HIP ARTHROPLASTY  02/22/2014   right hip; Dr [facility]   Family History  Problem Relation Age of Onset  . Hypertension Mother   . Transient  ischemic attack Mother   . Lung cancer Mother        liver cancer, smoker, TIA  . Heart attack Father 22  . Heart attack Brother 21       smoker  . Diabetes Neg Hx   . Colon cancer Neg Hx   . Prostate cancer Neg Hx     Social History   Socioeconomic History  . Marital status: Married    Spouse name: Not on file  . Number of children: 2  . Years of education: Not on file  . Highest education level: Not on file  Occupational History  . Occupation: retired- Chiropodist: [employer]  . Financial resource strain: Not on file  . Food insecurity    Worry: Not on file    Inability: Not on file  . Transportation needs    Medical: Not on file    Non-medical: Not on file  Tobacco Use  . Smoking status: Former Smoker    Packs/day: 1.00    Years: 6.00    Pack years: 6.00    Types: Cigarettes    Quit date: 10/28/1966    Years since quitting: 52.7  . Smokeless  tobacco: Never Used  . Tobacco comment: smoked [place], up to < 1 ppd  Substance and Sexual Activity  . Alcohol use: Yes    Alcohol/week: 3.0 standard drinks    Types: 3 Glasses of wine per week    Comment: socially  . Drug use: No  . Sexual activity: Not on file  Lifestyle  . Physical activity    Days per week: Not on file    Minutes per session: Not on file  . Stress: Not on file  Relationships  . Social Herbalist on phone: Not on file    Gets together: Not on file    Attends religious service: Not on file    Active member of club or organization: Not on file    Attends meetings of clubs or organizations: Not on file    Relationship status: Not on file  . Intimate partner violence    Fear of current or ex partner: Not on file    Emotionally abused: Not on file    Physically abused: Not on file    Forced sexual activity: Not on file  Other Topics Concern  . Not on file  Social History Narrative   Socially; lives with spouse   Children 2; dtr lives in  [place], son lives [place]   3 grandchildren       Allergies as of 07/06/2019      Reactions   Povidone-iodine    REACTION: rash locally   Ofloxacin    Affected sleep with eye drop formulation   Tape Rash   Cloth adhesive tape-per patient Cloth adhesive tape-per patient Cloth adhesive tape-per patient      Medication List       Accurate as of July 06, 2019 11:59 PM. If you have any questions, ask your nurse or doctor.        aspirin EC 81 MG tablet Take 81 mg by mouth daily.   CENTRUM SILVER PO Take 1 tablet by mouth daily.   PreserVision AREDS 2+Multi Vit Caps Take by mouth.   CoQ10 200 MG Caps Take by mouth.   hydrochlorothiazide 25 MG tablet Commonly known as: HYDRODIURIL Take 1 tablet (25 mg total) by mouth daily.   levothyroxine 50 MCG tablet Commonly known as: SYNTHROID Take 1 tablet (50 mcg total) by mouth daily before breakfast.   losartan 100 MG tablet Commonly known as: COZAAR Take 1 tablet (100 mg total) by mouth daily.   Magnesium 400 MG Caps Take 400 mg by mouth daily.   metoprolol tartrate 25 MG tablet Commonly known as: LOPRESSOR Take 0.5 tablets (12.5 mg total) by mouth daily.   OMEGA 3 500 PO Take 500 mg by mouth daily.   omeprazole 20 MG capsule Commonly known as: PRILOSEC TAKE 1 CAPSULE DAILY   OSTEO BI-FLEX REGULAR STRENGTH PO Take 2 tablets by mouth daily.   rosuvastatin 20 MG tablet Commonly known as: CRESTOR Take 1 tablet (20 mg total) by mouth daily.   TURMERIC PO Take by mouth as directed.           Objective:   Physical Exam BP (!) 154/58 (BP Location: Left Arm, Patient Position: Sitting, Cuff Size: Normal)   Pulse (!) 46   Temp (!) 97.5 F (36.4 C) (Temporal)   Resp 18   Ht 6\' 1"  (1.854 m)   Wt 282 lb 6 oz (128.1 kg)   SpO2 94%   BMI 37.25 kg/m  General: Well developed, NAD, BMI noted  Neck: No  thyromegaly  HEENT:  Normocephalic . Face symmetric, some ecchymoses near the left eye due to recent  surgery Lungs:  CTA B Normal respiratory effort, no intercostal retractions, no accessory muscle use. Heart: RRR, trace systolic murmur  (?) No pretibial edema bilaterally  Abdomen:  Not distended, soft, non-tender. No rebound or rigidity.   Skin: Exposed areas without rash. Not pale. Not jaundice Neurologic:  alert & oriented X3.  Speech normal, gait appropriate for age and unassisted Strength symmetric and appropriate for age.  Psych: Cognition and judgment appear intact.  Cooperative with normal attention span and concentration.  Behavior appropriate. No anxious or depressed appearing.     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 Ambulatory BPs typically in the 130s/70s with pulse 55-60 Checking general labs including TSH and A1c. L UTS: See last visit, decided not to take Flomax as symptoms are mild.  Will consider take it on a trial basis RTC 6 months

## 2019-07-06 NOTE — Patient Instructions (Addendum)
Please schedule Medicare Wellness with Glenard Haring.   GO TO THE LAB : Get the blood work     GO TO THE FRONT DESK Schedule your next appointment   For a check up in 6 months    Check the  blood pressure 2 or 3 times a week BP GOAL is between 110/65 and  135/85. If it is consistently higher or lower, let me know

## 2019-07-07 NOTE — Assessment & Plan Note (Signed)
CPX today, chronic medical problems seem stable. Ambulatory BPs typically in the 130s/70s with pulse 55-60 Checking general labs including TSH and A1c. L UTS: See last visit, decided not to take Flomax as symptoms are mild.  Will consider take it on a trial basis RTC 6 months

## 2019-07-08 MED ORDER — POTASSIUM CHLORIDE ER 10 MEQ PO TBCR: 10.0000 meq | EXTENDED_RELEASE_TABLET | Freq: Every day | ORAL | 0 refills | Status: DC

## 2019-07-08 NOTE — Addendum Note (Signed)
Addended byDamita Dunnings D on: 07/08/2019 11:32 AM   Modules accepted: Orders

## 2019-07-13 NOTE — Progress Notes (Signed)
Patient ID: Steven Munoz, male   DOB: 09/02/1941, 78 y.o.   MRN: [id number]     78 y.o. CABG August 2010 He had ostial LAD, distal RCA/PDA disease and had LIMA to LAD SVG Sequential to PDA and PLB EF normal circumflex not grafted. Dr [name] follow him for OSA and CPAP Activity limited by right hip pain post surgery at [facility] and f/u tendon release. Dr Clent Jacks.  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 Rx with diuretic On statin for HLD  Echo updated 12/08/17 AV sclerosis severe LAE 57 mm EF normal reviewd  Last seen by PA 06/16/19 tele visit Needed dacryocystorhinostomy for tear duct blockage No complications During procedure  Echo reviewed 12/08/17  EF 55-60%Severe LAE no significant valve disease   Needs f/u tear duct surgery on right and stent removed from left eye still Complication from Bell's palsy   ROS: Denies fever, malais, weight loss, blurry vision, decreased visual acuity, cough, sputum, SOB, hemoptysis, pleuritic pain, palpitaitons, heartburn, abdominal pain, melena, lower extremity edema, claudication, or rash.  All other systems reviewed and negative  General: BP 124/68   Pulse 68   Ht 6\' 1"  (1.854 m)   Wt 287 lb (130.2 kg)   SpO2 96%   BMI 37.87 kg/m  Affect appropriate Overweight white male  RLE varicosities HEENT: Tinnitus  Neck supple with no adenopathy JVP normal no bruits no thyromegaly Lungs clear with no wheezing and good diaphragmatic motion Heart:  S1/S2 no murmur, no rub, gallop or click PMI normal Abdomen: benighn, BS positve, no tenderness, no AAA no bruit.  No HSM or HJR Distal pulses intact with no bruits Neuro non-focal Skin warm and dry Right TKR       Current Outpatient Medications  Medication Sig Dispense Refill  . aspirin EC 81 MG tablet Take 81 mg by mouth daily.    . Coenzyme Q10 (COQ10) 200 MG CAPS Take by mouth.    . Glucosamine-Chondroitin (OSTEO BI-FLEX REGULAR STRENGTH PO) Take 2 tablets by  mouth daily.    . hydrochlorothiazide (HYDRODIURIL) 25 MG tablet Take 1 tablet (25 mg total) by mouth daily. 90 tablet 1  . levothyroxine (SYNTHROID, LEVOTHROID) 50 MCG tablet Take 1 tablet (50 mcg total) by mouth daily before breakfast. 90 tablet 1  . losartan (COZAAR) 100 MG tablet Take 1 tablet (100 mg total) by mouth daily. 90 tablet 1  . Magnesium 400 MG CAPS Take 400 mg by mouth daily.    . metoprolol tartrate (LOPRESSOR) 25 MG tablet Take 0.5 tablets (12.5 mg total) by mouth daily. 45 tablet 1  . Multiple Vitamins-Minerals (CENTRUM SILVER PO) Take 1 tablet by mouth daily.    . Multiple Vitamins-Minerals (PRESERVISION AREDS 2+MULTI VIT) CAPS Take by mouth.    . Omega-3 Fatty Acids (OMEGA 3 500 PO) Take 500 mg by mouth daily.    Marland Kitchen omeprazole (PRILOSEC) 20 MG capsule TAKE 1 CAPSULE DAILY 90 capsule 3  . potassium chloride (K-DUR) 10 MEQ tablet Take 1 tablet (10 mEq total) by mouth daily. 30 tablet 0  . rosuvastatin (CRESTOR) 20 MG tablet Take 1 tablet (20 mg total) by mouth daily. 90 tablet 1  . TURMERIC PO Take by mouth as directed.     No current facility-administered medications for this visit.     Allergies  Povidone-iodine, Ofloxacin, and Tape  Electrocardiogram:  07/26/19 SR rate 56 PVC nonspecific ST changes   Assessment and Plan  Murmur:  Reviewed echo  from 12/08/17 EF 55-60% AV sclerosis trivial MR no significant valve Dx  CAD/CABG: 2010 lima to LAD and SVG to PDA/PLB no angina continue medical Rx   PAF: post op no recurrence ASA and beta blocker LA severely dilated on most echo  12/08/17 57 mm Puts him at increased risk of recurrence   HTN: Well controlled.  Continue current medications and low sodium Dash type diet.    Thyroid:  Lab Results  Component Value Date   TSH 2.05 07/06/2019    Cholesterol  Lab Results  Component Value Date   LDLCALC 55 07/06/2019    Ortho: post right hip surgery [name]  Post tendon release in June 2018  with relief of pain  First  Degree: no change on ECG today no high grade AV block   ENT:  F/u [facility] bells palsy resolved still with tinnitus wearing hearing aids post tear duct surgery   F/U in a year    [employer]

## 2019-07-14 DIAGNOSIS — H04223 Epiphora due to insufficient drainage, bilateral lacrimal glands: Secondary | ICD-10-CM | POA: Diagnosis not present

## 2019-07-14 DIAGNOSIS — H04553 Acquired stenosis of bilateral nasolacrimal duct: Secondary | ICD-10-CM | POA: Diagnosis not present

## 2019-07-15 DIAGNOSIS — R69 Illness, unspecified: Secondary | ICD-10-CM | POA: Diagnosis not present

## 2019-07-26 ENCOUNTER — Encounter: Payer: Self-pay | Admitting: Cardiovascular Disease

## 2019-07-26 ENCOUNTER — Other Ambulatory Visit: Payer: Self-pay

## 2019-07-26 ENCOUNTER — Ambulatory Visit: Payer: Medicare HMO | Admitting: Cardiovascular Disease

## 2019-07-26 VITALS — BP 124/68 | HR 68 | Ht 73.0 in | Wt 287.0 lb

## 2019-07-26 DIAGNOSIS — I480 Paroxysmal atrial fibrillation: Secondary | ICD-10-CM

## 2019-07-26 DIAGNOSIS — Z951 Presence of aortocoronary bypass graft: Secondary | ICD-10-CM | POA: Diagnosis not present

## 2019-07-26 NOTE — Patient Instructions (Addendum)

## 2019-07-27 ENCOUNTER — Other Ambulatory Visit (INDEPENDENT_AMBULATORY_CARE_PROVIDER_SITE_OTHER): Payer: Medicare HMO

## 2019-07-27 ENCOUNTER — Other Ambulatory Visit: Payer: Self-pay

## 2019-07-27 DIAGNOSIS — E876 Hypokalemia: Secondary | ICD-10-CM | POA: Diagnosis not present

## 2019-07-27 LAB — BASIC METABOLIC PANEL
BUN: 13 mg/dL (ref 6–23)
CO2: 29 mEq/L (ref 19–32)
Calcium: 9.9 mg/dL (ref 8.4–10.5)
Chloride: 101 mEq/L (ref 96–112)
Creatinine, Ser: 0.83 mg/dL (ref 0.40–1.50)
GFR: 89.45 mL/min (ref >60.00)
Glucose, Bld: 103 mg/dL — ABNORMAL HIGH (ref 70–99)
Potassium: 3.9 mEq/L (ref 3.5–5.1)
Sodium: 139 mEq/L (ref 135–145)

## 2019-07-27 MED ORDER — POTASSIUM CHLORIDE ER 10 MEQ PO TBCR: 10.0000 meq | EXTENDED_RELEASE_TABLET | Freq: Every day | ORAL | 6 refills | Status: DC

## 2019-07-27 NOTE — Addendum Note (Signed)
Addended byDamita Dunnings D on: 07/27/2019 12:54 PM   Modules accepted: Orders

## 2019-08-01 ENCOUNTER — Other Ambulatory Visit: Payer: Self-pay | Admitting: Internal Medicine

## 2019-08-04 DIAGNOSIS — R197 Diarrhea, unspecified: Secondary | ICD-10-CM | POA: Insufficient documentation

## 2019-08-04 DIAGNOSIS — E739 Lactose intolerance, unspecified: Secondary | ICD-10-CM | POA: Diagnosis not present

## 2019-08-04 DIAGNOSIS — K5790 Diverticulosis of intestine, part unspecified, without perforation or abscess without bleeding: Secondary | ICD-10-CM | POA: Insufficient documentation

## 2019-08-09 ENCOUNTER — Other Ambulatory Visit: Payer: Self-pay | Admitting: Internal Medicine

## 2019-08-12 DIAGNOSIS — Z1159 Encounter for screening for other viral diseases: Secondary | ICD-10-CM | POA: Diagnosis not present

## 2019-08-16 DIAGNOSIS — H04552 Acquired stenosis of left nasolacrimal duct: Secondary | ICD-10-CM | POA: Diagnosis not present

## 2019-08-16 DIAGNOSIS — H04553 Acquired stenosis of bilateral nasolacrimal duct: Secondary | ICD-10-CM | POA: Diagnosis not present

## 2019-08-16 DIAGNOSIS — H04221 Epiphora due to insufficient drainage, right lacrimal gland: Secondary | ICD-10-CM | POA: Diagnosis not present

## 2019-08-16 DIAGNOSIS — H04411 Chronic dacryocystitis of right lacrimal passage: Secondary | ICD-10-CM | POA: Diagnosis not present

## 2019-08-16 DIAGNOSIS — H04223 Epiphora due to insufficient drainage, bilateral lacrimal glands: Secondary | ICD-10-CM | POA: Diagnosis not present

## 2019-08-16 DIAGNOSIS — H04561 Stenosis of right lacrimal punctum: Secondary | ICD-10-CM | POA: Diagnosis not present

## 2019-08-16 DIAGNOSIS — H04551 Acquired stenosis of right nasolacrimal duct: Secondary | ICD-10-CM | POA: Diagnosis not present

## 2019-08-16 DIAGNOSIS — H02413 Mechanical ptosis of bilateral eyelids: Secondary | ICD-10-CM | POA: Diagnosis not present

## 2019-09-01 DIAGNOSIS — H04553 Acquired stenosis of bilateral nasolacrimal duct: Secondary | ICD-10-CM | POA: Diagnosis not present

## 2019-09-01 DIAGNOSIS — H04411 Chronic dacryocystitis of right lacrimal passage: Secondary | ICD-10-CM | POA: Diagnosis not present

## 2019-09-01 DIAGNOSIS — H04223 Epiphora due to insufficient drainage, bilateral lacrimal glands: Secondary | ICD-10-CM | POA: Diagnosis not present

## 2019-09-01 DIAGNOSIS — H02413 Mechanical ptosis of bilateral eyelids: Secondary | ICD-10-CM | POA: Diagnosis not present

## 2019-09-03 ENCOUNTER — Other Ambulatory Visit: Payer: Self-pay | Admitting: Internal Medicine

## 2019-09-03 DIAGNOSIS — I10 Essential (primary) hypertension: Secondary | ICD-10-CM

## 2019-09-16 ENCOUNTER — Ambulatory Visit: Payer: Self-pay | Admitting: Internal Medicine

## 2019-09-16 NOTE — Telephone Encounter (Signed)
Spoke w/ Pt- informed of PCP recommendations. Pt verbalized understanding.  

## 2019-09-16 NOTE — Telephone Encounter (Signed)
Summary: Covid 19 follow up   Pt stated that his daughter tested positive for Covid 19 on 08/16/19. They would like to know if it is okay to go around daughter and her family, since the holidays are coming up. Please advise. Would like to hear from a nurse.          Patient returned call- disconnected during transfer- can not connect on call back.

## 2019-09-16 NOTE — Telephone Encounter (Signed)
I agree, if the daughter was diagnosed 5 weeks ago and she is feeling well, there is no risk that I know.  I still recommend masking and all the other precautions

## 2019-09-16 NOTE — Telephone Encounter (Signed)
Please advise 

## 2019-09-16 NOTE — Telephone Encounter (Signed)
Patient is calling to verify isolation restrictions- he wants to travel to see family- they had tested + COVID 1 month ago. It will be 5 weeks since positive results- contagious period should be over. Advised patient to continue safe practices during visit. Will send note to PCP for review. Reason for Disposition . Health Information question, no triage required and triager able to answer question  Protocols used: INFORMATION ONLY CALL-A-AH

## 2019-09-16 NOTE — Telephone Encounter (Signed)
Called and left VM to return call to office for information requested.

## 2019-10-03 DIAGNOSIS — M109 Gout, unspecified: Secondary | ICD-10-CM | POA: Insufficient documentation

## 2019-10-04 ENCOUNTER — Other Ambulatory Visit: Payer: Self-pay | Admitting: Internal Medicine

## 2019-10-04 DIAGNOSIS — D125 Benign neoplasm of sigmoid colon: Secondary | ICD-10-CM | POA: Diagnosis not present

## 2019-10-04 DIAGNOSIS — I868 Varicose veins of other specified sites: Secondary | ICD-10-CM | POA: Diagnosis not present

## 2019-10-04 DIAGNOSIS — K648 Other hemorrhoids: Secondary | ICD-10-CM | POA: Diagnosis not present

## 2019-10-04 DIAGNOSIS — D122 Benign neoplasm of ascending colon: Secondary | ICD-10-CM | POA: Diagnosis not present

## 2019-10-04 DIAGNOSIS — Z1211 Encounter for screening for malignant neoplasm of colon: Secondary | ICD-10-CM | POA: Diagnosis not present

## 2019-10-04 DIAGNOSIS — D124 Benign neoplasm of descending colon: Secondary | ICD-10-CM | POA: Diagnosis not present

## 2019-10-04 DIAGNOSIS — K5730 Diverticulosis of large intestine without perforation or abscess without bleeding: Secondary | ICD-10-CM | POA: Diagnosis not present

## 2019-10-04 DIAGNOSIS — Z86010 Personal history of colonic polyps: Secondary | ICD-10-CM | POA: Diagnosis not present

## 2019-10-04 DIAGNOSIS — K635 Polyp of colon: Secondary | ICD-10-CM | POA: Diagnosis not present

## 2019-10-04 DIAGNOSIS — K626 Ulcer of anus and rectum: Secondary | ICD-10-CM | POA: Diagnosis not present

## 2019-10-04 LAB — HM COLONOSCOPY

## 2019-10-14 DIAGNOSIS — H04223 Epiphora due to insufficient drainage, bilateral lacrimal glands: Secondary | ICD-10-CM | POA: Diagnosis not present

## 2019-10-14 DIAGNOSIS — H04553 Acquired stenosis of bilateral nasolacrimal duct: Secondary | ICD-10-CM | POA: Diagnosis not present

## 2019-10-18 ENCOUNTER — Encounter: Payer: Self-pay | Admitting: Internal Medicine

## 2019-10-19 DIAGNOSIS — H04223 Epiphora due to insufficient drainage, bilateral lacrimal glands: Secondary | ICD-10-CM | POA: Diagnosis not present

## 2019-10-19 DIAGNOSIS — H04553 Acquired stenosis of bilateral nasolacrimal duct: Secondary | ICD-10-CM | POA: Diagnosis not present

## 2019-11-09 DIAGNOSIS — H57813 Brow ptosis, bilateral: Secondary | ICD-10-CM | POA: Diagnosis not present

## 2019-11-09 DIAGNOSIS — H0232 Blepharochalasis right lower eyelid: Secondary | ICD-10-CM | POA: Diagnosis not present

## 2019-11-09 DIAGNOSIS — H0231 Blepharochalasis right upper eyelid: Secondary | ICD-10-CM | POA: Diagnosis not present

## 2019-11-09 DIAGNOSIS — H02423 Myogenic ptosis of bilateral eyelids: Secondary | ICD-10-CM | POA: Diagnosis not present

## 2019-11-09 DIAGNOSIS — Z8669 Personal history of other diseases of the nervous system and sense organs: Secondary | ICD-10-CM | POA: Diagnosis not present

## 2019-11-09 DIAGNOSIS — H0235 Blepharochalasis left lower eyelid: Secondary | ICD-10-CM | POA: Diagnosis not present

## 2019-11-09 DIAGNOSIS — H04223 Epiphora due to insufficient drainage, bilateral lacrimal glands: Secondary | ICD-10-CM | POA: Diagnosis not present

## 2019-11-09 DIAGNOSIS — H04553 Acquired stenosis of bilateral nasolacrimal duct: Secondary | ICD-10-CM | POA: Diagnosis not present

## 2019-11-09 DIAGNOSIS — D23121 Other benign neoplasm of skin of left upper eyelid, including canthus: Secondary | ICD-10-CM | POA: Diagnosis not present

## 2019-11-09 DIAGNOSIS — H0234 Blepharochalasis left upper eyelid: Secondary | ICD-10-CM | POA: Diagnosis not present

## 2019-11-10 ENCOUNTER — Ambulatory Visit: Payer: Medicare Other | Attending: Internal Medicine

## 2019-11-10 DIAGNOSIS — Z23 Encounter for immunization: Secondary | ICD-10-CM

## 2019-11-10 NOTE — Progress Notes (Signed)
   Covid-19 Vaccination Clinic  Name:  Steven Munoz    MRN: [id number] DOB: January 24, 1941  11/10/2019  [name] was observed post Covid-19 immunization for 15 minutes without incidence. He was provided with Vaccine Information Sheet and instruction to access the V-Safe system.   [name] was instructed to call 911 with any severe reactions post vaccine: Marland Kitchen Difficulty breathing  . Swelling of your face and throat  . A fast heartbeat  . A bad rash all over your body  . Dizziness and weakness    Immunizations Administered    Name Date Dose VIS Date Route   Pfizer COVID-19 Vaccine 11/10/2019  8:53 AM 0.3 mL 10/08/2019 Intramuscular   Manufacturer: [place]   Lot: [id number]   [name]: [id number]

## 2019-11-22 DIAGNOSIS — H02423 Myogenic ptosis of bilateral eyelids: Secondary | ICD-10-CM | POA: Diagnosis not present

## 2019-11-22 DIAGNOSIS — Z8669 Personal history of other diseases of the nervous system and sense organs: Secondary | ICD-10-CM | POA: Diagnosis not present

## 2019-11-22 DIAGNOSIS — D23121 Other benign neoplasm of skin of left upper eyelid, including canthus: Secondary | ICD-10-CM | POA: Diagnosis not present

## 2019-11-22 DIAGNOSIS — H0232 Blepharochalasis right lower eyelid: Secondary | ICD-10-CM | POA: Diagnosis not present

## 2019-11-22 DIAGNOSIS — H0234 Blepharochalasis left upper eyelid: Secondary | ICD-10-CM | POA: Diagnosis not present

## 2019-11-22 DIAGNOSIS — H57813 Brow ptosis, bilateral: Secondary | ICD-10-CM | POA: Diagnosis not present

## 2019-11-22 DIAGNOSIS — H0235 Blepharochalasis left lower eyelid: Secondary | ICD-10-CM | POA: Diagnosis not present

## 2019-11-22 DIAGNOSIS — H0231 Blepharochalasis right upper eyelid: Secondary | ICD-10-CM | POA: Diagnosis not present

## 2019-11-22 DIAGNOSIS — H04223 Epiphora due to insufficient drainage, bilateral lacrimal glands: Secondary | ICD-10-CM | POA: Diagnosis not present

## 2019-11-22 DIAGNOSIS — H04553 Acquired stenosis of bilateral nasolacrimal duct: Secondary | ICD-10-CM | POA: Diagnosis not present

## 2019-11-30 ENCOUNTER — Ambulatory Visit: Payer: Medicare HMO | Attending: Internal Medicine

## 2019-11-30 DIAGNOSIS — Z23 Encounter for immunization: Secondary | ICD-10-CM | POA: Insufficient documentation

## 2019-11-30 NOTE — Progress Notes (Signed)
   Covid-19 Vaccination Clinic  Name:  Steven Munoz    MRN: [id number] DOB: [birth year]  11/30/2019  Mr. Klasen was observed post Covid-19 immunization for 15 minutes without incidence. He was provided with Vaccine Information Sheet and instruction to access the V-Safe system.   Mr. Barrick was instructed to call 911 with any severe reactions post vaccine: Marland Kitchen Difficulty breathing  . Swelling of your face and throat  . A fast heartbeat  . A bad rash all over your body  . Dizziness and weakness    Immunizations Administered    Name Date Dose VIS Date Route   Pfizer COVID-19 Vaccine 11/30/2019  8:18 AM 0.3 mL 10/08/2019 Intramuscular   Manufacturer: [place]   Lot: [id number]   [place]: [id number]

## 2019-12-08 DIAGNOSIS — H25813 Combined forms of age-related cataract, bilateral: Secondary | ICD-10-CM | POA: Diagnosis not present

## 2019-12-08 DIAGNOSIS — H52203 Unspecified astigmatism, bilateral: Secondary | ICD-10-CM | POA: Diagnosis not present

## 2019-12-08 DIAGNOSIS — H353131 Nonexudative age-related macular degeneration, bilateral, early dry stage: Secondary | ICD-10-CM | POA: Diagnosis not present

## 2019-12-08 DIAGNOSIS — H04223 Epiphora due to insufficient drainage, bilateral lacrimal glands: Secondary | ICD-10-CM | POA: Diagnosis not present

## 2020-01-03 ENCOUNTER — Encounter: Payer: Self-pay | Admitting: Internal Medicine

## 2020-01-03 ENCOUNTER — Other Ambulatory Visit: Payer: Self-pay

## 2020-01-03 ENCOUNTER — Ambulatory Visit (INDEPENDENT_AMBULATORY_CARE_PROVIDER_SITE_OTHER): Payer: Medicare HMO | Admitting: Internal Medicine

## 2020-01-03 ENCOUNTER — Telehealth: Payer: Self-pay | Admitting: Internal Medicine

## 2020-01-03 VITALS — BP 155/58 | HR 50 | Temp 95.8°F | Resp 16 | Ht 73.0 in | Wt 285.0 lb

## 2020-01-03 DIAGNOSIS — I10 Essential (primary) hypertension: Secondary | ICD-10-CM | POA: Diagnosis not present

## 2020-01-03 DIAGNOSIS — E039 Hypothyroidism, unspecified: Secondary | ICD-10-CM

## 2020-01-03 LAB — BASIC METABOLIC PANEL
BUN: 16 mg/dL (ref 6–23)
CO2: 30 mEq/L (ref 19–32)
Calcium: 9.5 mg/dL (ref 8.4–10.5)
Chloride: 101 mEq/L (ref 96–112)
Creatinine, Ser: 0.87 mg/dL (ref 0.40–1.50)
GFR: 84.63 mL/min (ref >60.00)
Glucose, Bld: 108 mg/dL — ABNORMAL HIGH (ref 70–99)
Potassium: 3.9 mEq/L (ref 3.5–5.1)
Sodium: 139 mEq/L (ref 135–145)

## 2020-01-03 NOTE — Progress Notes (Signed)
Subjective:    Patient ID: Steven Munoz, male    DOB: [birth year], 79 y.o.   MRN: [id number]  DOS:  01/03/2020 Type of visit - description: Follow-up Since the last office visit he is doing well. We talk about HTN, his eye problems. Good compliance with medications. Note from cardiology reviewed    Review of Systems Denies chest pain no difficulty breathing. No cough No dysuria, gross hematuria difficulty urinating  Past Medical History:  Diagnosis Date  . CAD (coronary artery disease) CABG  05/29/09    OPERATIVE PROCEDURES:  Median sternotomy, extracorporeal circulation,   . Colonic polyp 2002, 2005, 2009   [name]  . Dermatophytosis of nail   . Diverticulosis   . Dysmetabolic syndrome   . GERD (gastroesophageal reflux disease)    S/P dilation  2005  . Gout   . Hyperlipidemia   . Hypertension   . Hypothyroidism   . Murmur   . Nephrolithiasis    X 1  . Pneumonia age 38   treated as inpatient  . Sleep apnea    on CPAP; Dr [name]    Past Surgical History:  Procedure Laterality Date  . athroscopy     right knee X2, left knee X1  . COLONOSCOPY  2002 & 2009 &2014   polypectomy; [name]  . CORONARY ARTERY BYPASS GRAFT  2010   Median sternotomy, extracorporeal circulation, coronary bypass graft surgery x3 using a left internal mammary artery graft to left anterior descending coronary artery, with a sequential  saphenous vein graft to posterior descending and posterolateral branches  of the right coronary artery.  Endoscopic vein harvesting from the right  . EYE SURGERY Right    tear duct  . Nail Alvusion Bilateral    Hallux  . NOSE SURGERY  11/20/12   L max antrectomy;septoplasty;turbinate reduction. [name]  . REPLACEMENT TOTAL KNEE     right 2003  . REPLACEMENT TOTAL KNEE     partial Left March 2009  . TENDON RELEASE Right 03/2016   from R hip. @ [name]  . TOTAL HIP ARTHROPLASTY  02/22/2014   right hip; Dr [facility]    Allergies as of  01/03/2020      Reactions   Povidone-iodine    REACTION: rash locally   Ofloxacin    Affected sleep with eye drop formulation   Tape Rash   Cloth adhesive tape-per patient Cloth adhesive tape-per patient Cloth adhesive tape-per patient      Medication List       Accurate as of January 03, 2020  9:07 AM. If you have any questions, ask your nurse or doctor.        aspirin EC 81 MG tablet Take 81 mg by mouth daily.   CENTRUM SILVER PO Take 1 tablet by mouth daily.   PreserVision AREDS 2+Multi Vit Caps Take by mouth.   CoQ10 200 MG Caps Take by mouth.   hydrochlorothiazide 25 MG tablet Commonly known as: HYDRODIURIL Take 1 tablet (25 mg total) by mouth daily.   levothyroxine 50 MCG tablet Commonly known as: SYNTHROID Take 1 tablet (50 mcg total) by mouth daily before breakfast.   losartan 100 MG tablet Commonly known as: COZAAR Take 1 tablet (100 mg total) by mouth daily.   Magnesium 400 MG Caps Take 400 mg by mouth daily.   metoprolol tartrate 25 MG tablet Commonly known as: LOPRESSOR Take 0.5 tablets (12.5 mg total) by mouth daily.   OMEGA 3 500 PO Take  500 mg by mouth daily.   omeprazole 20 MG capsule Commonly known as: PRILOSEC TAKE 1 CAPSULE DAILY   OSTEO BI-FLEX REGULAR STRENGTH PO Take 2 tablets by mouth daily.   potassium chloride 10 MEQ tablet Commonly known as: KLOR-CON Take 1 tablet (10 mEq total) by mouth daily.   rosuvastatin 20 MG tablet Commonly known as: CRESTOR Take 1 tablet (20 mg total) by mouth daily.   TURMERIC PO Take by mouth as directed.          Objective:   Physical Exam BP (!) 155/58 (BP Location: Left Arm, Patient Position: Sitting, Cuff Size: Normal)   Pulse (!) 50   Temp (!) 95.8 F (35.4 C) (Temporal)   Resp 16   Ht 6\' 1"  (1.854 m)   Wt 285 lb (129.3 kg)   SpO2 97%   BMI 37.60 kg/m  General:   Well developed, NAD, BMI noted. HEENT:  Normocephalic . Face symmetric, atraumatic Lungs:  CTA B Normal  respiratory effort, no intercostal retractions, no accessory muscle use. Heart: RRR, soft systolic murmur Lower extremities: Trace periankle edema Skin: Not pale. Not jaundice Neurologic:  alert & oriented X3.  Speech normal, gait appropriate for age and unassisted Psych--  Cognition and judgment appear intact.  Cooperative with normal attention span and concentration.  Behavior appropriate. No anxious or depressed appearing.      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  Occasionally in the 140s.  For now continue with HCTZ, losartan, metoprolol, potassium.  Check a BMP Hypothyroidism: On Synthroid, last TSH satisfactory CAD, paroxysmal A. fib after CABG: Stable, was recommended to continue aspirin and other medications. Epiphora: had a procedure done to his eyes, it did not work, has a second opinion scheduled Preventive care: Had a colonoscopy, had Covid Pfizer vaccine twice  RTC 6 months  This visit occurred during the [employer] health emergency.  Safety protocols were in place, including screening questions prior to the visit, additional usage of staff PPE, and extensive cleaning of exam room while observing appropriate contact time as indicated for disinfecting solutions.

## 2020-01-03 NOTE — Chronic Care Management (AMB) (Signed)
°  Chronic Care Management   Note  01/03/2020 Name: [name] MRN: [id number] DOB: 1941/09/30  Steven Munoz is a 79 y.o. year old male who is a primary care patient of Colon Branch, MD. I reached out to Delia Chimes by phone today in response to a referral sent by Steven Munoz PCP, Colon Branch, MD.   Steven Munoz was given information about Chronic Care Management services today including:  1. CCM service includes personalized support from designated clinical staff supervised by his physician, including individualized plan of care and coordination with other care providers 2. 24/7 contact phone numbers for assistance for urgent and routine care needs. 3. Service will only be billed when office clinical staff spend 20 minutes or more in a month to coordinate care. 4. Only one practitioner may furnish and bill the service in a calendar month. 5. The patient may stop CCM services at any time (effective at the end of the month) by phone call to the office staff.   Patient agreed to services and verbal consent obtained.   Follow up plan:   Raynicia [facility] [employer] Scheduler

## 2020-01-03 NOTE — Progress Notes (Signed)
Pre visit review using our clinic review tool, if applicable. No additional management support is needed unless otherwise documented below in the visit note. 

## 2020-01-03 NOTE — Patient Instructions (Addendum)
Please schedule Medicare Wellness with Glenard Haring.   GO TO THE LAB : Get the blood work     GO TO [place] Come back for a physical exam in 6 months, please make an appointment   Check your blood pressure once or twice a week. BP GOAL is between 110/65 and  135/85. If it is consistently higher or lower, let me know

## 2020-01-04 NOTE — Assessment & Plan Note (Signed)
HTN: BP today slightly elevated, home is consistently in the 130s over 70.  Occasionally in the 140s.  For now continue with HCTZ, losartan, metoprolol, potassium.  Check a BMP Hypothyroidism: On Synthroid, last TSH satisfactory CAD, paroxysmal A. fib after CABG: Stable, was recommended to continue aspirin and other medications. Epiphora: had a procedure done to his eyes, it did not work, has a second opinion scheduled Preventive care: Had a colonoscopy, had [employer] vaccine twice  RTC 6 months

## 2020-01-11 ENCOUNTER — Other Ambulatory Visit: Payer: Self-pay | Admitting: Internal Medicine

## 2020-01-13 DIAGNOSIS — R69 Illness, unspecified: Secondary | ICD-10-CM | POA: Diagnosis not present

## 2020-01-18 ENCOUNTER — Other Ambulatory Visit: Payer: Self-pay

## 2020-01-18 ENCOUNTER — Ambulatory Visit: Payer: Medicare HMO | Admitting: Pharmacist

## 2020-01-18 DIAGNOSIS — I10 Essential (primary) hypertension: Secondary | ICD-10-CM

## 2020-01-18 DIAGNOSIS — E785 Hyperlipidemia, unspecified: Secondary | ICD-10-CM

## 2020-01-18 DIAGNOSIS — I480 Paroxysmal atrial fibrillation: Secondary | ICD-10-CM

## 2020-01-18 DIAGNOSIS — E039 Hypothyroidism, unspecified: Secondary | ICD-10-CM

## 2020-01-18 DIAGNOSIS — I25810 Atherosclerosis of coronary artery bypass graft(s) without angina pectoris: Secondary | ICD-10-CM

## 2020-01-18 DIAGNOSIS — K219 Gastro-esophageal reflux disease without esophagitis: Secondary | ICD-10-CM

## 2020-01-18 NOTE — Chronic Care Management (AMB) (Signed)
Chronic Care Management Pharmacy  Name: Steven Munoz  MRN: [id number] DOB: 09-11-1941  Chief Complaint/ HPI  Steven Munoz,  79 y.o. , male presents for their Initial CCM visit with the clinical pharmacist via telephone due to COVID-19 Pandemic.  PCP : Colon Branch, MD  Their chronic conditions include: AFib, HTN, HLD, CAD, Hypothyroidism, Hypokalemia, GERD  Office Visits: 01/03/20: Visit w/ Dr. Larose Kells - Follow up on HTN and hypothyroidism. No med changes noted. Labs ordered (BMP)  Consult Visit: 10/04/19: Benign poly removal w/ Dr. [name]  07/26/19: Cardio visit w/ Dr. Johnsie Cancel - AFIb post op with no recurrence. On aspirin and beta blocker. LA severely dilated on most recent echo 12/08/17. This puts him at increased risk of recurrence. No med changes noted.  Medications: Outpatient Encounter Medications as of 01/18/2020  Medication Sig  . aspirin EC 81 MG tablet Take 81 mg by mouth daily.  . Coenzyme Q10 (COQ10) 200 MG CAPS Take by mouth.  . hydrochlorothiazide (HYDRODIURIL) 25 MG tablet Take 1 tablet (25 mg total) by mouth daily.  Marland Kitchen levothyroxine (SYNTHROID) 50 MCG tablet Take 1 tablet (50 mcg total) by mouth daily before breakfast.  . losartan (COZAAR) 100 MG tablet Take 1 tablet (100 mg total) by mouth daily.  . Magnesium 400 MG CAPS Take 400 mg by mouth daily.  . metoprolol tartrate (LOPRESSOR) 25 MG tablet Take 0.5 tablets (12.5 mg total) by mouth daily.  . Multiple Vitamins-Minerals (CENTRUM SILVER PO) Take 1 tablet by mouth daily.  . Multiple Vitamins-Minerals (PRESERVISION AREDS 2+MULTI VIT) CAPS Take 1 capsule by mouth 2 (two) times daily.   . Omega-3 Fatty Acids (OMEGA 3 500 PO) Take 1,000 mg by mouth daily. Has 1000mg  capsules  . omeprazole (PRILOSEC) 20 MG capsule TAKE 1 CAPSULE DAILY  . potassium chloride (KLOR-CON) 10 MEQ tablet Take 1 tablet (10 mEq total) by mouth daily.  . rosuvastatin (CRESTOR) 20 MG tablet Take 1 tablet (20 mg total) by mouth daily.  .  Glucosamine-Chondroitin (OSTEO BI-FLEX REGULAR STRENGTH PO) Take 2 tablets by mouth daily.  . TURMERIC PO Take by mouth as directed.   No facility-administered encounter medications on file as of 01/18/2020.   Immunization History  Administered Date(s) Administered  . Fluad Quad(high Dose 65+) 07/06/2019  . Influenza Split 08/21/2011, 08/26/2012  . Influenza Whole 07/29/2008, 08/10/2009, 08/28/2010  . Influenza, High Dose Seasonal PF 08/13/2016, 08/12/2017, 08/26/2018  . Influenza,inj,Quad PF,6+ Mos 08/31/2013, 08/16/2014, 07/25/2015  . PFIZER SARS-COV-2 Vaccination 11/10/2019, 11/30/2019  . Pneumococcal Conjugate-13 05/09/2015  . Pneumococcal Polysaccharide-23 05/27/2011  . Td 05/18/2010  . Zoster 06/06/2016     Current Diagnosis/Assessment:  Goals Addressed            This Visit's Progress   . A1c goal less than 6.5%       -Pre-Diabetes a1c range is 5.7% to 6.4%. Your most recent a1c was 5.7% on 07/06/19.  -Usually the full diabetes diagnosis is given when a1c reaches 6.5% or higher.     . Blood pressure goal less than 140/90      . Consider going back to the gym if there is appropriate social distancing      . Discuss possibility of stopping omeprazole with Dr. [name]      . LDL goal less than 70      . Pharmacy Care Plan       CARE PLAN ENTRY  Current Barriers:  . Chronic Disease Management support, education, and care coordination needs  related to AFib, HTN, HLD, CAD, Hypothyroidism, Hypokalemia, GERD  Pharmacist Clinical Goal(s):  Marland Kitchen A1c goal <6.5% . LDL goal <70 . Blood pressure goal <140/90  Interventions: . Comprehensive medication review performed. . Check blood pressure 2-3 times per week . Consider going back to the gym if socially distanced . Discuss possibility of discontinuing omeprazole with Dr. [name]  Patient Self Care Activities:  . Patient verbalizes understanding of plan to follow as described above, Self administers medications as prescribed,  Calls pharmacy for medication refills, and Calls provider office for new concerns or questions  Initial goal documentation       Plans to go back to the gym tomorrow  AFIB   Patient is currently rate controlled. Post op occurrence.  Followed by Dr. Johnsie Cancel  Pt states this has not been significantly emphasized to him. No mention of anticoagulation.  Patient has failed these meds in past: None noted  Patient is currently controlled on the following medications: metoprolol tartrate 25mg  1/2 tab daily  Plan -Continue current medications   Diabetes Screening (Pre-Diabetes)   Recent Relevant Labs: Lab Results  Component Value Date/Time   HGBA1C 5.7 07/06/2019 09:42 AM   HGBA1C 5.7 12/16/2017 11:22 AM   MICROALBUR 1.2 11/26/2006 12:00 AM    Patient is currently controlled on the following medications: None   We discussed: diet and exercise extensively  Plan -Continue control with diet and exercise   Hypertension   CMP Latest Ref Rng & Units 01/03/2020 07/27/2019 07/06/2019  Glucose 70 - 99 mg/dL 108(H) 103(H) 105(H)  BUN 6 - 23 mg/dL 16 13 15   Creatinine 0.40 - 1.50 mg/dL 0.87 0.83 0.89  Sodium 135 - 145 mEq/L 139 139 143  Potassium 3.5 - 5.1 mEq/L 3.9 3.9 3.3(L)  Chloride 96 - 112 mEq/L 101 101 101  CO2 19 - 32 mEq/L 30 29 32  Calcium 8.4 - 10.5 mg/dL 9.5 9.9 9.5  Total Protein 6.0 - 8.3 g/dL - - 6.9  Total Bilirubin 0.2 - 1.2 mg/dL - - 1.2  Alkaline Phos 39 - 117 U/L - - 63  AST 0 - 37 U/L - - 17  ALT 0 - 53 U/L - - 16  GFR       84.63   89.45   82.54      BP today is:  Unable to assess due to phone visit  Office blood pressures are  BP Readings from Last 3 Encounters:  01/03/20 (!) 155/58  07/26/19 124/68  07/06/19 (!) 154/58    Patient has failed these meds in the past: None noted  Patient is currently uncontrolled on the following medications: losartan 100mg  daily, hctz 25mg  daily, metoprolol tartrate 25mg  1/2 tab daily  Denies dizziness,  lightheadedness, chest pain  Patient checks BP at home infrequently  Patient home BP readings are ranging: 130s-140s/60s  Plan -Check blood pressure 2-3 times per week -Continue current medications   Hyperlipidemia   Lipid Panel     Component Value Date/Time   CHOL 115 07/06/2019 0942   TRIG 104.0 07/06/2019 0942   HDL 39.30 07/06/2019 0942   CHOLHDL 3 07/06/2019 0942   VLDL 20.8 07/06/2019 0942   LDLCALC 55 07/06/2019 0942     The ASCVD Risk score (Goff DC Jr., et al., 2013) failed to calculate for the following reasons:   The valid total cholesterol range is 130 to 320 mg/dL   Patient has failed these meds in past: None noted  Patient is currently controlled on the  following medications: rosuvastatin 20mg  dailyHS, coQ10 200mg  dailyHS, fish oil 1000mg  daily  Plan -Continue current medications   CAD    Patient has failed these meds in past: None noted  Patient is currently controlled on the following medications: aspirin 81mg  HS daily  Was taking aspirin 325mg  and was taken off of it a few years ago.   Plan -Continue current medications   Hypothyroidism   TSH  Date Value Ref Range Status  07/06/2019 2.05 0.35 - 4.50 uIU/mL Final     Patient has failed these meds in past: None noted  Patient is currently controlled on the following medications: levothyroxine 2mcg daily  Plan -Continue current medications  Hypokalemia    Patient has failed these meds in past: None noted  Patient is currently controlled on the following medications: potassium chloride 14mEq daily  Plan -Continue current medications   GERD    Patient has failed these meds in past: None noted  Patient is currently controlled on the following medications: omeprazole 20mg  daily  Breakthrough Sx: None  Had esophagus stretched in the past Has taken omeprazole ever since (~2004)  We discussed:  Possibility of D/C of omeprazole. He will discuss with Dr. [name]  Plan -Continue  current medications  -Discuss possibility of discontinuing omeprazole if deemed appropriate   Miscellaneous Meds Magnesium 400mg  daily Multivitamins (Preservision and Centrum Silver) Turmeric

## 2020-01-18 NOTE — Patient Instructions (Signed)
Visit Information  Goals Addressed            This Visit's Progress   . A1c goal less than 6.5%       -Pre-Diabetes a1c range is 5.7% to 6.4%. Your most recent a1c was 5.7% on 07/06/19.  -Usually the full diabetes diagnosis is given when a1c reaches 6.5% or higher.     . Blood pressure goal less than 140/90      . Consider going back to the gym if there is appropriate social distancing      . Discuss possibility of stopping omeprazole with Dr. [name]      . LDL goal less than 70      . Pharmacy Care Plan       CARE PLAN ENTRY  Current Barriers:  . Chronic Disease Management support, education, and care coordination needs related to AFib, HTN, HLD, CAD, Hypothyroidism, Hypokalemia, GERD  Pharmacist Clinical Goal(s):  Marland Kitchen A1c goal <6.5% . LDL goal <70 . Blood pressure goal <140/90  Interventions: . Comprehensive medication review performed. . Check blood pressure 2-3 times per week . Consider going back to the gym if socially distanced . Discuss possibility of discontinuing omeprazole with Dr. [name]  Patient Self Care Activities:  . Patient verbalizes understanding of plan to follow as described above, Self administers medications as prescribed, Calls pharmacy for medication refills, and Calls provider office for new concerns or questions  Initial goal documentation        Mr. Schnapp was given information about Chronic Care Management services today including:  1. CCM service includes personalized support from designated clinical staff supervised by his physician, including individualized plan of care and coordination with other care providers 2. 24/7 contact phone numbers for assistance for urgent and routine care needs. 3. Standard insurance, coinsurance, copays and deductibles apply for chronic care management only during months in which we provide at least 20 minutes of these services. Most insurances cover these services at 100%, however patients may be responsible for  any copay, coinsurance and/or deductible if applicable. This service may help you avoid the need for more expensive face-to-face services. 4. Only one practitioner may furnish and bill the service in a calendar month. 5. The patient may stop CCM services at any time (effective at the end of the month) by phone call to the office staff.  Patient agreed to services and verbal consent obtained.   The patient verbalized understanding of instructions provided today and agreed to receive a mailed copy of patient instruction and/or educational materials. Telephone follow up appointment with pharmacy team member scheduled for: 02/15/2020  Melvenia Beam Sacheen Arrasmith, PharmD Clinical Pharmacist [facility] [phone]   Blood Pressure Record Sheet To take your blood pressure, you will need a blood pressure machine. You can buy a blood pressure machine (blood pressure monitor) at your clinic, drug store, or online. When choosing one, consider:  An automatic monitor that has an arm cuff.  A cuff that wraps snugly around your upper arm. You should be able to fit only one finger between your arm and the cuff.  A device that stores blood pressure reading results.  Do not choose a monitor that measures your blood pressure from your wrist or finger. Follow your health care provider's instructions for how to take your blood pressure. To use this form:  Get one reading in the morning (a.m.) before you take any medicines.  Get one reading in the evening (p.m.) before supper.  Take at least 2 readings with each blood pressure check. This makes sure the results are correct. Wait 1-2 minutes between measurements.  Write down the results in the spaces on this form.  Repeat this once a week, or as told by your health care provider.  Make a follow-up appointment with your health care provider to discuss the results. Blood pressure log Date: _______________________  a.m.  _____________________(1st reading) _____________________(2nd reading)  p.m. _____________________(1st reading) _____________________(2nd reading) Date: _______________________  a.m. _____________________(1st reading) _____________________(2nd reading)  p.m. _____________________(1st reading) _____________________(2nd reading) Date: _______________________  a.m. _____________________(1st reading) _____________________(2nd reading)  p.m. _____________________(1st reading) _____________________(2nd reading) Date: _______________________  a.m. _____________________(1st reading) _____________________(2nd reading)  p.m. _____________________(1st reading) _____________________(2nd reading) Date: _______________________  a.m. _____________________(1st reading) _____________________(2nd reading)  p.m. _____________________(1st reading) _____________________(2nd reading) This information is not intended to replace advice given to you by your health care provider. Make sure you discuss any questions you have with your health care provider. Document Revised: 12/12/2017 Document Reviewed: 10/14/2017 Elsevier Patient Education  [employer].   How to Take Your Blood Pressure You can take your blood pressure at home with a machine. You may need to check your blood pressure at home:  To check if you have high blood pressure (hypertension).  To check your blood pressure over time.  To make sure your blood pressure medicine is working. Supplies needed: You will need a blood pressure machine, or monitor. You can buy one at a drugstore or online. When choosing one:  Choose one with an arm cuff.  Choose one that wraps around your upper arm. Only one finger should fit between your arm and the cuff.  Do not choose one that measures your blood pressure from your wrist or finger. Your doctor can suggest a monitor. How to prepare Avoid these things for 30 minutes before checking your blood  pressure:  Drinking caffeine.  Drinking alcohol.  Eating.  Smoking.  Exercising. Five minutes before checking your blood pressure:  Pee.  Sit in a dining chair. Avoid sitting in a soft couch or armchair.  Be quiet. Do not talk. How to take your blood pressure Follow the instructions that came with your machine. If you have a digital blood pressure monitor, these may be the instructions: 1. Sit up straight. 2. Place your feet on the floor. Do not cross your ankles or legs. 3. Rest your left arm at the level of your heart. You may rest it on a table, desk, or chair. 4. Pull up your shirt sleeve. 5. Wrap the blood pressure cuff around the upper part of your left arm. The cuff should be 1 inch (2.5 cm) above your elbow. It is best to wrap the cuff around bare skin. 6. Fit the cuff snugly around your arm. You should be able to place only one finger between the cuff and your arm. 7. Put the cord inside the groove of your elbow. 8. Press the power button. 9. Sit quietly while the cuff fills with air and loses air. 10. Write down the numbers on the screen. 11. Wait 2-3 minutes and then repeat steps 1-10. What do the numbers mean? Two numbers make up your blood pressure. The first number is called systolic pressure. The second is called diastolic pressure. An example of a blood pressure reading is "120 over 80" (or 120/80). If you are an adult and do not have a medical condition, use this guide to find out if your blood pressure is normal: Normal  First  number: below 120.  Second number: below 80. Elevated  First number: 120-129.  Second number: below 80. Hypertension stage 1  First number: 130-139.  Second number: 80-89. Hypertension stage 2  First number: 140 or above.  Second number: 96 or above. Your blood pressure is above normal even if only the top or bottom number is above normal. Follow these instructions at home:  Check your blood pressure as often as your  doctor tells you to.  Take your monitor to your next doctor's appointment. Your doctor will: ? Make sure you are using it correctly. ? Make sure it is working right.  Make sure you understand what your blood pressure numbers should be.  Tell your doctor if your medicines are causing side effects. Contact a doctor if:  Your blood pressure keeps being high. Get help right away if:  Your first blood pressure number is higher than 180.  Your second blood pressure number is higher than 120. This information is not intended to replace advice given to you by your health care provider. Make sure you discuss any questions you have with your health care provider. Document Revised: 09/26/2017 Document Reviewed: 03/22/2016 Elsevier Patient Education  2020 [employer].

## 2020-01-21 ENCOUNTER — Other Ambulatory Visit: Payer: Self-pay | Admitting: Internal Medicine

## 2020-01-25 DIAGNOSIS — H0235 Blepharochalasis left lower eyelid: Secondary | ICD-10-CM | POA: Diagnosis not present

## 2020-01-25 DIAGNOSIS — H0234 Blepharochalasis left upper eyelid: Secondary | ICD-10-CM | POA: Diagnosis not present

## 2020-01-25 DIAGNOSIS — H04223 Epiphora due to insufficient drainage, bilateral lacrimal glands: Secondary | ICD-10-CM | POA: Diagnosis not present

## 2020-01-25 DIAGNOSIS — H57813 Brow ptosis, bilateral: Secondary | ICD-10-CM | POA: Diagnosis not present

## 2020-01-25 DIAGNOSIS — Z8669 Personal history of other diseases of the nervous system and sense organs: Secondary | ICD-10-CM | POA: Diagnosis not present

## 2020-01-25 DIAGNOSIS — H04553 Acquired stenosis of bilateral nasolacrimal duct: Secondary | ICD-10-CM | POA: Diagnosis not present

## 2020-01-25 DIAGNOSIS — H02423 Myogenic ptosis of bilateral eyelids: Secondary | ICD-10-CM | POA: Diagnosis not present

## 2020-01-25 DIAGNOSIS — H0232 Blepharochalasis right lower eyelid: Secondary | ICD-10-CM | POA: Diagnosis not present

## 2020-01-25 DIAGNOSIS — D23121 Other benign neoplasm of skin of left upper eyelid, including canthus: Secondary | ICD-10-CM | POA: Diagnosis not present

## 2020-01-25 DIAGNOSIS — H0231 Blepharochalasis right upper eyelid: Secondary | ICD-10-CM | POA: Diagnosis not present

## 2020-02-09 DIAGNOSIS — H0289 Other specified disorders of eyelid: Secondary | ICD-10-CM | POA: Diagnosis not present

## 2020-02-09 DIAGNOSIS — H04553 Acquired stenosis of bilateral nasolacrimal duct: Secondary | ICD-10-CM | POA: Diagnosis not present

## 2020-02-09 DIAGNOSIS — H04552 Acquired stenosis of left nasolacrimal duct: Secondary | ICD-10-CM | POA: Diagnosis not present

## 2020-02-09 DIAGNOSIS — H04223 Epiphora due to insufficient drainage, bilateral lacrimal glands: Secondary | ICD-10-CM | POA: Diagnosis not present

## 2020-02-09 DIAGNOSIS — G510 Bell's palsy: Secondary | ICD-10-CM | POA: Insufficient documentation

## 2020-02-09 DIAGNOSIS — H04551 Acquired stenosis of right nasolacrimal duct: Secondary | ICD-10-CM | POA: Diagnosis not present

## 2020-02-09 DIAGNOSIS — J342 Deviated nasal septum: Secondary | ICD-10-CM | POA: Diagnosis not present

## 2020-02-15 ENCOUNTER — Telehealth: Payer: Medicare HMO

## 2020-02-16 ENCOUNTER — Ambulatory Visit: Payer: Medicare HMO | Admitting: Pharmacist

## 2020-02-16 ENCOUNTER — Other Ambulatory Visit: Payer: Self-pay

## 2020-02-16 DIAGNOSIS — R739 Hyperglycemia, unspecified: Secondary | ICD-10-CM

## 2020-02-16 DIAGNOSIS — I10 Essential (primary) hypertension: Secondary | ICD-10-CM

## 2020-02-16 NOTE — Chronic Care Management (AMB) (Signed)
Chronic Care Management Pharmacy  Name: GEOVANNY SARTIN  MRN: [id number] DOB: 05-23-1941  Chief Complaint/ HPI  Steven Munoz,  79 y.o. , male presents for their Initial CCM visit with the clinical pharmacist via telephone due to COVID-19 Pandemic.  PCP : Colon Branch, MD  Their chronic conditions include: AFib, HTN, HLD, CAD, Hypothyroidism, Hypokalemia, GERD  Office Visits: 01/03/20: Visit w/ Dr. Larose Kells - Follow up on HTN and hypothyroidism. No med changes noted. Labs ordered (BMP)  Consult Visit: 10/04/19: Benign poly removal w/ Dr. [name]  07/26/19: Cardio visit w/ Dr. Johnsie Cancel - AFIb post op with no recurrence. On aspirin and beta blocker. LA severely dilated on most recent echo 12/08/17. This puts him at increased risk of recurrence. No med changes noted.  Medications: Outpatient Encounter Medications as of 02/16/2020  Medication Sig  . aspirin EC 81 MG tablet Take 81 mg by mouth daily.  . Coenzyme Q10 (COQ10) 200 MG CAPS Take by mouth.  . Glucosamine-Chondroitin (OSTEO BI-FLEX REGULAR STRENGTH PO) Take 2 tablets by mouth daily.  . hydrochlorothiazide (HYDRODIURIL) 25 MG tablet Take 1 tablet (25 mg total) by mouth daily.  Marland Kitchen levothyroxine (SYNTHROID) 50 MCG tablet Take 1 tablet (50 mcg total) by mouth daily before breakfast.  . losartan (COZAAR) 100 MG tablet Take 1 tablet (100 mg total) by mouth daily.  . Magnesium 400 MG CAPS Take 400 mg by mouth daily.  . metoprolol tartrate (LOPRESSOR) 25 MG tablet Take 0.5 tablets (12.5 mg total) by mouth daily.  . Multiple Vitamins-Minerals (CENTRUM SILVER PO) Take 1 tablet by mouth daily.  . Multiple Vitamins-Minerals (PRESERVISION AREDS 2+MULTI VIT) CAPS Take 1 capsule by mouth 2 (two) times daily.   . Omega-3 Fatty Acids (OMEGA 3 500 PO) Take 1,000 mg by mouth daily. Has 1051m capsules  . omeprazole (PRILOSEC) 20 MG capsule TAKE 1 CAPSULE DAILY  . potassium chloride (KLOR-CON) 10 MEQ tablet Take 1 tablet (10 mEq total) by mouth daily.  .  rosuvastatin (CRESTOR) 20 MG tablet Take 1 tablet (20 mg total) by mouth daily.  . TURMERIC PO Take by mouth as directed.   No facility-administered encounter medications on file as of 02/16/2020.   Immunization History  Administered Date(s) Administered  . Fluad Quad(high Dose 65+) 07/06/2019  . Influenza Split 08/21/2011, 08/26/2012  . Influenza Whole 07/29/2008, 08/10/2009, 08/28/2010  . Influenza, High Dose Seasonal PF 08/13/2016, 08/12/2017, 08/26/2018  . Influenza,inj,Quad PF,6+ Mos 08/31/2013, 08/16/2014, 07/25/2015  . PFIZER SARS-COV-2 Vaccination 11/10/2019, 11/30/2019  . Pneumococcal Conjugate-13 05/09/2015  . Pneumococcal Polysaccharide-23 05/27/2011  . Td 05/18/2010  . Zoster 06/06/2016     Current Diagnosis/Assessment:  Goals Addressed            This Visit's Progress   . Consider going back to the gym if there is appropriate social distancing   On track   . Discuss possibility of stopping omeprazole with Dr. MEarlean Shawl  Not on track   . Hyperlipidemia: LDL goal less than 70       CARE PLAN ENTRY (see longitudinal plan of care for additional care plan information)  Current Barriers:  . Uncontrolled hyperlipidemia, complicated by hypertension . Current antihyperlipidemic regimen: rosuvastatin 244mdaily . Previous antihyperlipidemic medications tried None noted  . Most recent lipid panel:     Component Value Date/Time   CHOL 115 07/06/2019 0942   TRIG 104.0 07/06/2019 0942   HDL 39.30 07/06/2019 0942   CHOLHDL 3 07/06/2019 0942   VLDL 20.8 07/06/2019  [place] 07/06/2019 0942   . 10-year ASCVD risk score: The ASCVD Risk score Mikey Bussing DC Jr., et al., 2013) failed to calculate for the following reasons: .   The valid total cholesterol range is 130 to 320 mg/dL   Pharmacist Clinical Goal(s):  Marland Kitchen Over the next 180 days, patient will work with PharmD and providers towards optimized antihyperlipidemic therapy  Interventions: . Comprehensive medication  review performed; medication list updated in electronic medical record.  Bertram Savin care team collaboration (see longitudinal plan of care)  Patient Self Care Activities:  . Over the next 180 days patient will focus on medication adherence by filling HLD meds appropriately  Initial goal documentation     . Hypertension: Blood pressure goal less than 140/90   On track    [place] (see longitudinal plan of care for additional care plan information)  Current Barriers:  . Uncontrolled hypertension, complicated by hyperlipidemia . Current antihypertensive regimen: losartan 16m daily, hctz 244mdaily, metoprolol tartrate 2563m/2 tab daily . Previous antihypertensives tried: None noted  . Last practice recorded BP readings:  BP Readings from Last 3 Encounters:  01/03/20 (!) 155/58  07/26/19 124/68  07/06/19 (!) 154/58 .   . CMarland Kitchenrrent home BP readings:  . Systolic Avg: 137409.7Diastolic Avg: Did not average it out, but states it stays around 65 . Pulse Avg: Did not average it out, but states it stays around 55 . Most recent eGFR: 84.63  Pharmacist Clinical Goal(s):  . OMarland Kitchener the next 90 days, patient will work with PharmD and providers to optimize antihypertensive regimen  Interventions: . Inter-disciplinary care team collaboration (see longitudinal plan of care) . Comprehensive medication review performed; medication list updated in the electronic medical record.   Patient Self Care Activities:  . Over next 90 days patient will continue to check BP 2-3 times per , document, and provide at future appointments . Patient will focus on medication adherence by filling HTN meds appropriately  Initial goal documentation     . Pharmacy Care Plan   On track    CARE PLAN ENTRY  Current Barriers:  . Chronic Disease Management support, education, and care coordination needs related to AFib, HTN, HLD, CAD, Hypothyroidism, Hypokalemia, GERD  Pharmacist Clinical Goal(s):   . AMarland Kitchenc goal <6.5% . LDL goal <70 . Blood pressure goal <140/90  Interventions: . Comprehensive medication review performed. . Check blood pressure 2-3 times per week (on track) . Consider going back to the gym if socially distanced (on track) . Discuss possibility of discontinuing omeprazole with Dr. MedEarlean Shawlt will do next week)  Patient Self Care Activities:  . Patient verbalizes understanding of plan to follow as described above, Self administers medications as prescribed, Calls pharmacy for medication refills, and Calls provider office for new concerns or questions  Please see past updates related to this goal by clicking on the "Past Updates" button in the selected goal      . Pre-Diabetes: A1c goal less than 6.5%       -Pre-Diabetes a1c range is 5.7% to 6.4%. Your most recent a1c was 5.7% on 07/06/19.  -Usually the full diabetes diagnosis is given when a1c reaches 6.5% or higher.  CARE PLAN ENTRY (see longitudinal plan of care for additional care plan information)  Current Barriers:  . Pre-Diabetes: complicated by chronic medical conditions including hyperlipidemia and hypertension Lab Results  Component Value Date   HGBA1C 5.7 07/06/2019 .   Lab Results  Component Value  Date   CREATININE 0.87 01/03/2020   CREATININE 0.83 07/27/2019   CREATININE 0.89 07/06/2019 .   Marland Kitchen GFR: 84.63 mL/min . Current antihyperglycemic regimen: None . Cardiovascular risk reduction: o Current hypertensive regimen: losartan 1110m daily, hctz 266mdaily, metoprolol tartrate 2511m/2 tab daily o Current hyperlipidemia regimen: rosuvastatin 36m45mily o Current antiplatelet regimen: aspirin 81mg19mly  Pharmacist Clinical Goal(s):  . OveMarland Kitchen the next 180 days, patient will work with PharmD and primary care provider to address maintaining a1c <6.5%  Interventions: . Comprehensive medication review performed, medication list updated in electronic medical record . Inter-disciplinary care team  collaboration (see longitudinal plan of care)  Patient Self Care Activities:  . Over the next 180 days patient will continue going to the gym 3 days a week.  Initial goal documentation        Plans to go back to the gym tomorrow   Diabetes Screening (Pre-Diabetes)   Recent Relevant Labs: Lab Results  Component Value Date/Time   HGBA1C 5.7 07/06/2019 09:42 AM   HGBA1C 5.7 12/16/2017 11:22 AM   MICROALBUR 1.2 11/26/2006 12:00 AM    Patient is currently controlled on the following medications: None   We discussed: diet and exercise extensively   Started back going to the gym about 3 weeks ago. Going to the gym 3 times a week for 1 hr - 1.5 hrs.  Wears mask at gym  Plan -Continue control with diet and exercise   Hypertension   CMP Latest Ref Rng & Units 01/03/2020 07/27/2019 07/06/2019  Glucose 70 - 99 mg/dL 108(H) 103(H) 105(H)  BUN 6 - 23 mg/dL _0 Creatinine 0.40 - 1.50 mg/dL 0.87 0.83 0.89  Sodium 135 - 145 mEq/L 139 139 143  Potassium 3.5 - 5.1 mEq/L 3.9 3.9 3.3(L)  Chloride 96 - 112 mEq/L 101 101 101  CO2 19 - 32 mEq/L 30 29 32  Calcium 8.4 - 10.5 mg/dL 9.5 9.9 9.5  Total Protein 6.0 - 8.3 g/dL - - 6.9  Total Bilirubin 0.2 - 1.2 mg/dL - - 1.2  Alkaline Phos 39 - 117 U/L - - 63  AST 0 - 37 U/L - - 17  ALT 0 - 53 U/L - - 16  GFR       84.63   89.45   82.54      BP today is:  Unable to assess due to phone visit  Office blood pressures are  BP Readings from Last 3 Encounters:  01/03/20 (!) 155/58  07/26/19 124/68  07/06/19 (!) 154/58    Patient has failed these meds in the past: None noted  Patient is currently controlled at home on the following medications: losartan 100mg 53my, hctz 25mg d70m, metoprolol tartrate 25mg 1/97mb daily  Denies dizziness, lightheadedness, chest pain  Patient checks BP at home 3-5x per week  Patient home BP readings are ranging: He did 39 readings since last visit.  Systolic Avg: 137.9 Di812.7ic Avg: Did not average  it out, but states it stays around 65 Pulse Avg: Did not average it out, but states it stays around 55  Plan -Check blood pressure 2-3 times per week -Continue current medications    GERD    Patient has failed these meds in past: None noted  Patient is currently controlled on the following medications: omeprazole 36mg dai8mBreakthrough Sx: None  Had esophagus stretched in the past Has taken omeprazole ever since (~2004)  Hasn't spoken to Dr. Medoff abEarlean Shawleprazole yet,  but states he will soon  We discussed:  Possibility of D/C of omeprazole. He will discuss with Dr. [name]  Plan -Continue current medications  -Discuss possibility of discontinuing omeprazole if deemed appropriate

## 2020-02-16 NOTE — Patient Instructions (Addendum)
Visit Information  Goals Addressed            This Visit's Progress   . Consider going back to the gym if there is appropriate social distancing   On track   . Discuss possibility of stopping omeprazole with Dr. [name]   Not on track   . Hyperlipidemia: LDL goal less than 70       CARE PLAN ENTRY (see longitudinal plan of care for additional care plan information)  Current Barriers:  . Uncontrolled hyperlipidemia, complicated by hypertension . Current antihyperlipidemic regimen: rosuvastatin 49m daily . Previous antihyperlipidemic medications tried None noted  . Most recent lipid panel:     Component Value Date/Time   CHOL 115 07/06/2019 0942   TRIG 104.0 07/06/2019 0942   HDL 39.30 07/06/2019 0942   CHOLHDL 3 07/06/2019 0942   VLDL 20.8 07/06/2019 0942   LDLCALC 55 07/06/2019 0942   . 10-year ASCVD risk score: The ASCVD Risk score (Mikey BussingDC Jr., et al., 2013) failed to calculate for the following reasons: .   The valid total cholesterol range is 130 to 320 mg/dL   Pharmacist Clinical Goal(s):  .Marland KitchenOver the next 180 days, patient will work with PharmD and providers towards optimized antihyperlipidemic therapy  Interventions: . Comprehensive medication review performed; medication list updated in electronic medical record.  .Bertram Savincare team collaboration (see longitudinal plan of care)  Patient Self Care Activities:  . Over the next 180 days patient will focus on medication adherence by filling HLD meds appropriately  Initial goal documentation     . Hypertension: Blood pressure goal less than 140/90   On track    [place](see longitudinal plan of care for additional care plan information)  Current Barriers:  . Uncontrolled hypertension, complicated by hyperlipidemia . Current antihypertensive regimen: losartan 1051mdaily, hctz 2538maily, metoprolol tartrate 13m31m2 tab daily . Previous antihypertensives tried: None noted  . Last practice  recorded BP readings:  BP Readings from Last 3 Encounters:  01/03/20 (!) 155/58  07/26/19 124/68  07/06/19 (!) 154/58 .   . CuMarland Kitchenrent home BP readings:  . Systolic Avg: 137.811.5iastolic Avg: Did not average it out, but states it stays around 65 . Pulse Avg: Did not average it out, but states it stays around 55 . Most recent eGFR: 84.63  Pharmacist Clinical Goal(s):  . OvMarland Kitchenr the next 90 days, patient will work with PharmD and providers to optimize antihypertensive regimen  Interventions: . Inter-disciplinary care team collaboration (see longitudinal plan of care) . Comprehensive medication review performed; medication list updated in the electronic medical record.   Patient Self Care Activities:  . Over next 90 days patient will continue to check BP 2-3 times per , document, and provide at future appointments . Patient will focus on medication adherence by filling HTN meds appropriately  Initial goal documentation     . Pharmacy Care Plan   On track    CARE PLAN ENTRY  Current Barriers:  . Chronic Disease Management support, education, and care coordination needs related to AFib, HTN, HLD, CAD, Hypothyroidism, Hypokalemia, GERD  Pharmacist Clinical Goal(s):  . A1Marland Kitchen goal <6.5% . LDL goal <70 . Blood pressure goal <140/90  Interventions: . Comprehensive medication review performed. . Check blood pressure 2-3 times per week (on track) . Consider going back to the gym if socially distanced (on track) . Discuss possibility of discontinuing omeprazole with Dr. MedoEarlean Shawl will do next week)  Patient Self Care  Activities:  . Patient verbalizes understanding of plan to follow as described above, Self administers medications as prescribed, Calls pharmacy for medication refills, and Calls provider office for new concerns or questions  Please see past updates related to this goal by clicking on the "Past Updates" button in the selected goal      . Pre-Diabetes: A1c goal less than 6.5%        -Pre-Diabetes a1c range is 5.7% to 6.4%. Your most recent a1c was 5.7% on 07/06/19.  -Usually the full diabetes diagnosis is given when a1c reaches 6.5% or higher.  CARE PLAN ENTRY (see longitudinal plan of care for additional care plan information)  Current Barriers:  . Pre-Diabetes: complicated by chronic medical conditions including hyperlipidemia and hypertension Lab Results  Component Value Date   HGBA1C 5.7 07/06/2019 .   Lab Results  Component Value Date   CREATININE 0.87 01/03/2020   CREATININE 0.83 07/27/2019   CREATININE 0.89 07/06/2019 .   Marland Kitchen GFR: 84.63 mL/min . Current antihyperglycemic regimen: None . Cardiovascular risk reduction: o Current hypertensive regimen: losartan 149m daily, hctz 260mdaily, metoprolol tartrate 2535m/2 tab daily o Current hyperlipidemia regimen: rosuvastatin 50m58mily o Current antiplatelet regimen: aspirin 81mg80mly  Pharmacist Clinical Goal(s):  . OveMarland Kitchen the next 180 days, patient will work with PharmD and primary care provider to address maintaining a1c <6.5%  Interventions: . Comprehensive medication review performed, medication list updated in electronic medical record . Inter-disciplinary care team collaboration (see longitudinal plan of care)  Patient Self Care Activities:  . Over the next 180 days patient will continue going to the gym 3 days a week.  Initial goal documentation         The patient verbalized understanding of instructions provided today and agreed to receive a mailed copy of patient instruction and/or educational materials.  Telephone follow up appointment with pharmacy team member scheduled for: 08/16/20  KanesDe BlanchrmD Clinical Pharmacist [facility] at [phone]

## 2020-02-17 ENCOUNTER — Other Ambulatory Visit: Payer: Self-pay | Admitting: Internal Medicine

## 2020-02-17 DIAGNOSIS — I10 Essential (primary) hypertension: Secondary | ICD-10-CM

## 2020-03-03 DIAGNOSIS — H04223 Epiphora due to insufficient drainage, bilateral lacrimal glands: Secondary | ICD-10-CM | POA: Diagnosis not present

## 2020-03-03 DIAGNOSIS — H04553 Acquired stenosis of bilateral nasolacrimal duct: Secondary | ICD-10-CM | POA: Diagnosis not present

## 2020-03-08 DIAGNOSIS — Z01810 Encounter for preprocedural cardiovascular examination: Secondary | ICD-10-CM | POA: Diagnosis not present

## 2020-03-08 DIAGNOSIS — R001 Bradycardia, unspecified: Secondary | ICD-10-CM | POA: Diagnosis not present

## 2020-03-08 DIAGNOSIS — I517 Cardiomegaly: Secondary | ICD-10-CM | POA: Diagnosis not present

## 2020-03-08 DIAGNOSIS — H04553 Acquired stenosis of bilateral nasolacrimal duct: Secondary | ICD-10-CM | POA: Diagnosis not present

## 2020-03-09 DIAGNOSIS — H04553 Acquired stenosis of bilateral nasolacrimal duct: Secondary | ICD-10-CM | POA: Diagnosis not present

## 2020-03-22 DIAGNOSIS — H04223 Epiphora due to insufficient drainage, bilateral lacrimal glands: Secondary | ICD-10-CM | POA: Diagnosis not present

## 2020-03-22 DIAGNOSIS — H04553 Acquired stenosis of bilateral nasolacrimal duct: Secondary | ICD-10-CM | POA: Diagnosis not present

## 2020-04-01 ENCOUNTER — Other Ambulatory Visit: Payer: Self-pay | Admitting: Internal Medicine

## 2020-04-03 DIAGNOSIS — H04223 Epiphora due to insufficient drainage, bilateral lacrimal glands: Secondary | ICD-10-CM | POA: Diagnosis not present

## 2020-04-03 DIAGNOSIS — H04553 Acquired stenosis of bilateral nasolacrimal duct: Secondary | ICD-10-CM | POA: Diagnosis not present

## 2020-04-20 ENCOUNTER — Other Ambulatory Visit: Payer: Self-pay | Admitting: Internal Medicine

## 2020-05-23 ENCOUNTER — Other Ambulatory Visit: Payer: Self-pay | Admitting: Internal Medicine

## 2020-06-14 DIAGNOSIS — R197 Diarrhea, unspecified: Secondary | ICD-10-CM | POA: Diagnosis not present

## 2020-06-14 DIAGNOSIS — K219 Gastro-esophageal reflux disease without esophagitis: Secondary | ICD-10-CM | POA: Diagnosis not present

## 2020-06-16 DIAGNOSIS — R197 Diarrhea, unspecified: Secondary | ICD-10-CM | POA: Diagnosis not present

## 2020-06-23 ENCOUNTER — Other Ambulatory Visit: Payer: Self-pay | Admitting: Internal Medicine

## 2020-06-26 DIAGNOSIS — H0289 Other specified disorders of eyelid: Secondary | ICD-10-CM | POA: Diagnosis not present

## 2020-06-26 DIAGNOSIS — H04223 Epiphora due to insufficient drainage, bilateral lacrimal glands: Secondary | ICD-10-CM | POA: Diagnosis not present

## 2020-06-26 DIAGNOSIS — H04553 Acquired stenosis of bilateral nasolacrimal duct: Secondary | ICD-10-CM | POA: Diagnosis not present

## 2020-06-28 ENCOUNTER — Encounter: Payer: Self-pay | Admitting: Internal Medicine

## 2020-06-29 ENCOUNTER — Telehealth: Payer: Self-pay | Admitting: Pharmacist

## 2020-06-29 NOTE — Progress Notes (Addendum)
Chronic Care Management Pharmacy Assistant   Name: Steven Munoz  MRN: [id number] DOB: Feb 23, 1941  Reason for Encounter: Disease State  Patient Questions:  1.  Have you seen any other providers since your last visit? Yes  2.  Any changes in your medicines or health? No  PCP : Colon Branch, MD   Their chronic conditions include: AFib, HTN, HLD, CAD, Hypothyroidism, Hypokalemia, GERD  There are no OV's or hospitalizations since their last visit with the clinical pharmacist.  Consults: 06-26-2020 (Ophthalmology) Follow up appointment  06-14-2020 Gertie Fey) Patient presented in the office with Dr. Alfredo Martinez for stomach issues.  04-03-2020 (Ophthalmology) Follow up appointment  03-22-2020 (Ophthalmology) Follow up appointment  03-09-2020 (Opthalmology) Outpatient procedure performed; Bilateral dacryocystorhinostomy.  03-08-2020 (Rehabilitation)- patient was seen by [facility]  03-03-2020 (Ophthalmology)- Patient was seen Ramonita Lab in office.    Allergies:   Allergies  Allergen Reactions  . Povidone-Iodine     REACTION: rash locally  . Ofloxacin     Affected sleep with eye drop formulation  . Tape Rash    Cloth adhesive tape-per patient Cloth adhesive tape-per patient Cloth adhesive tape-per patient    Medications: Outpatient Encounter Medications as of 06/29/2020  Medication Sig  . aspirin EC 81 MG tablet Take 81 mg by mouth daily.  . Coenzyme Q10 (COQ10) 200 MG CAPS Take by mouth.  . Glucosamine-Chondroitin (OSTEO BI-FLEX REGULAR STRENGTH PO) Take 2 tablets by mouth daily.  . hydrochlorothiazide (HYDRODIURIL) 25 MG tablet Take 1 tablet (25 mg total) by mouth daily.  Marland Kitchen levothyroxine (SYNTHROID) 50 MCG tablet Take 1 tablet (50 mcg total) by mouth daily before breakfast.  . losartan (COZAAR) 100 MG tablet Take 1 tablet (100 mg total) by mouth daily.  . Magnesium 400 MG CAPS Take 400 mg by mouth daily.  . metoprolol tartrate (LOPRESSOR) 25 MG tablet  Take 0.5 tablets (12.5 mg total) by mouth daily.  . Multiple Vitamins-Minerals (CENTRUM SILVER PO) Take 1 tablet by mouth daily.  . Multiple Vitamins-Minerals (PRESERVISION AREDS 2+MULTI VIT) CAPS Take 1 capsule by mouth 2 (two) times daily.   . Omega-3 Fatty Acids (OMEGA 3 500 PO) Take 1,000 mg by mouth daily. Has 1092m capsules  . omeprazole (PRILOSEC) 20 MG capsule Take 1 capsule (20 mg total) by mouth daily.  . potassium chloride (KLOR-CON) 10 MEQ tablet Take 1 tablet (10 mEq total) by mouth daily.  . rosuvastatin (CRESTOR) 20 MG tablet Take 1 tablet (20 mg total) by mouth daily.  . TURMERIC PO Take by mouth as directed.   No facility-administered encounter medications on file as of 06/29/2020.    Current Diagnosis: Patient Active Problem List   Diagnosis Date Noted  . Annual physical exam 06/06/2016  . PCP NOTES >>>>>>>>>>>> 02/13/2016  . CAD (coronary artery disease) 02/13/2016  . Pedal edema 01/25/2016  . Obesity (BMI 30-39.9) 06/02/2014  . CTS (carpal tunnel syndrome) 06/01/2014  . Varicose veins 04/07/2014  . Prolonged P-R interval 03/04/2013  . NEPHROLITHIASIS, HX OF 05/18/2010  . ATRIAL FIBRILLATION 06/20/2009  . CORONARY ARTERY BYPASS GRAFT, HX OF 06/20/2009  . PREMATURE VENTRICULAR CONTRACTIONS 05/09/2009  . MURMUR 05/08/2009  . Essential hypertension 04/10/2009  . DIVERTICULOSIS, COLON 04/10/2009  . G E R D 08/05/2008  . OSA on CPAP 08/05/2008  . DERMATOPHYTOSIS OF NAIL 07/29/2008  . Nocturia 05/30/2008  . Hypothyroidism 02/15/2008  . Elevated lipids 02/15/2008  . History of gout 02/15/2008  . DYSMETABOLIC SYNDROME X 063/89/3734 .  History of colonic polyps 01/14/2008    Goals Addressed   None    Reviewed chart prior to disease state call. Spoke with patient regarding BP  Recent Office Vitals: BP Readings from Last 3 Encounters:  01/03/20 (!) 155/58  07/26/19 124/68  07/06/19 (!) 154/58   Pulse Readings from Last 3 Encounters:  01/03/20 (!) 50    07/26/19 68  07/06/19 (!) 46    Wt Readings from Last 3 Encounters:  01/03/20 285 lb (129.3 kg)  07/26/19 287 lb (130.2 kg)  07/06/19 282 lb 6 oz (128.1 kg)     Kidney Function Lab Results  Component Value Date/Time   CREATININE 0.87 01/03/2020 09:33 AM   CREATININE 0.83 07/27/2019 09:19 AM   GFR 84.63 01/03/2020 09:33 AM   GFRNONAA 85.51 05/18/2010 12:00 AM   GFRAA  06/01/2009 04:45 AM    >60        The eGFR has been calculated using the MDRD equation. This calculation has not been validated in all clinical situations. eGFR's persistently <60 mL/min signify possible Chronic Kidney Disease.    BMP Latest Ref Rng & Units 01/03/2020 07/27/2019 07/06/2019  Glucose 70 - 99 mg/dL 108(H) 103(H) 105(H)  BUN 6 - 23 mg/dL _0 Creatinine 0.40 - 1.50 mg/dL 0.87 0.83 0.89  Sodium 135 - 145 mEq/L 139 139 143  Potassium 3.5 - 5.1 mEq/L 3.9 3.9 3.3(L)  Chloride 96 - 112 mEq/L 101 101 101  CO2 19 - 32 mEq/L 30 29 32  Calcium 8.4 - 10.5 mg/dL 9.5 9.9 9.5    . Current antihypertensive regimen:  o losartan 151m daily, hctz 233mdaily, metoprolol tartrate 2531m/2 tab daily . How often are you checking your Blood Pressure? 3-5x per week . Current home BP readings: Patient states his last blood pressure reading was 130/68. . What recent interventions/DTPs have been made by any provider to improve Blood Pressure control since last CPP Visit: none . Any recent hospitalizations or ED visits since last visit with CPP? No . What diet changes have been made to improve Blood Pressure Control?  o None in at this time . What exercise is being done to improve your Blood Pressure Control?  o None at this time  Patient states he had two new tear ducts placed. The first procedure was not effective, and he had to go back for a second procedure that was more invasive.  Patient states he stopped taking omeprazole due to excessive diarrhea. He reports taking Pepcid every night with  relief.  Adherence Review: Is the patient currently on ACE/ARB medication? Yes Does the patient have >5 day gap between last estimated fill dates? Yes  Rosuvastatin: 04/26/20 - 90DS, 01/12/20 - 90DS (>5 days between fills, will address at next CCM visit on 08/16/20)   Follow-Up:  Pharmacist RevLithoniaMABertrandarmacist Assistant [phone] by: KanDe BlanchharmD Clinical Pharmacist [facility] at [facility], [phone]

## 2020-07-04 ENCOUNTER — Other Ambulatory Visit: Payer: Self-pay

## 2020-07-04 ENCOUNTER — Encounter: Payer: Self-pay | Admitting: Internal Medicine

## 2020-07-04 ENCOUNTER — Ambulatory Visit (INDEPENDENT_AMBULATORY_CARE_PROVIDER_SITE_OTHER): Payer: Medicare HMO | Admitting: Internal Medicine

## 2020-07-04 VITALS — BP 133/70 | HR 46 | Temp 97.9°F | Resp 16 | Ht 73.0 in | Wt 287.4 lb

## 2020-07-04 DIAGNOSIS — Z0000 Encounter for general adult medical examination without abnormal findings: Secondary | ICD-10-CM | POA: Diagnosis not present

## 2020-07-04 DIAGNOSIS — E785 Hyperlipidemia, unspecified: Secondary | ICD-10-CM

## 2020-07-04 DIAGNOSIS — R739 Hyperglycemia, unspecified: Secondary | ICD-10-CM

## 2020-07-04 DIAGNOSIS — E039 Hypothyroidism, unspecified: Secondary | ICD-10-CM | POA: Diagnosis not present

## 2020-07-04 DIAGNOSIS — I10 Essential (primary) hypertension: Secondary | ICD-10-CM

## 2020-07-04 DIAGNOSIS — R69 Illness, unspecified: Secondary | ICD-10-CM | POA: Diagnosis not present

## 2020-07-04 MED ORDER — TETANUS-DIPHTH-ACELL PERTUSSIS 5-2.5-18.5 LF-MCG/0.5 IM SUSP: 0.5000 mL | Freq: Once | INTRAMUSCULAR | 0 refills | Status: AC

## 2020-07-04 NOTE — Patient Instructions (Signed)
Check the  blood pressure regularly   GO TO THE LAB : Get the blood work     [place], [place] Come back for   a checkup 6 months

## 2020-07-04 NOTE — Progress Notes (Signed)
Pre visit review using our clinic review tool, if applicable. No additional management support is needed unless otherwise documented below in the visit note. 

## 2020-07-04 NOTE — Progress Notes (Signed)
Subjective:    Patient ID: Steven Munoz, male    DOB: May 22, 1941, 79 y.o.   MRN: [id number]  DOS:  07/04/2020 Type of visit - description: CPX  No major concerns.   Review of Systems   A 14 point review of systems is negative    Past Medical History:  Diagnosis Date   CAD (coronary artery disease) CABG  05/29/09    OPERATIVE PROCEDURES:  Median sternotomy, extracorporeal circulation,    Colonic polyp 2002, 2005, 2009   [name]   Dermatophytosis of nail    Diverticulosis    Dysmetabolic syndrome    GERD (gastroesophageal reflux disease)    S/P dilation  2005   Gout    Hyperlipidemia    Hypertension    Hypothyroidism    Murmur    Nephrolithiasis    X 1   Pneumonia age 76   treated as inpatient   Sleep apnea    on CPAP; Dr [name]    Past Surgical History:  Procedure Laterality Date   athroscopy     right knee X2, left knee X1   COLONOSCOPY  2002 & 2009 &2014   polypectomy; [name]   CORONARY ARTERY BYPASS GRAFT  2010   Median sternotomy, extracorporeal circulation, coronary bypass graft surgery x3 using a left internal mammary artery graft to left anterior descending coronary artery, with a sequential  saphenous vein graft to posterior descending and posterolateral branches  of the right coronary artery.  Endoscopic vein harvesting from the right   EYE SURGERY Right    tear duct   Nail Alvusion Bilateral    Hallux   NOSE SURGERY  11/20/12   L max antrectomy;septoplasty;turbinate reduction. Dr Rheney, [place]     right 2003   REPLACEMENT TOTAL KNEE     partial Left March 2009   TENDON RELEASE Right 03/2016   from R hip. @ [place]  02/22/2014   right hip; Dr [facility]    Allergies as of 07/04/2020      Reactions   Povidone-iodine    REACTION: rash locally   Ofloxacin    Affected sleep with eye drop formulation   Tape Rash   Cloth adhesive tape-per patient Cloth adhesive  tape-per patient Cloth adhesive tape-per patient      Medication List       Accurate as of July 04, 2020 11:59 PM. If you have any questions, ask your nurse or doctor.        STOP taking these medications   omeprazole 20 MG capsule Commonly known as: PRILOSEC Stopped by: [name], MD   OSTEO BI-FLEX REGULAR STRENGTH PO Stopped by: [name], MD   TURMERIC PO Stopped by: [name], MD     TAKE these medications   aspirin EC 81 MG tablet Take 81 mg by mouth daily.   CENTRUM SILVER PO Take 1 tablet by mouth daily.   PreserVision AREDS 2+Multi Vit Caps Take 1 capsule by mouth 2 (two) times daily.   CoQ10 200 MG Caps Take by mouth.   famotidine 40 MG tablet Commonly known as: PEPCID Take 40 mg by mouth at bedtime.   hydrochlorothiazide 25 MG tablet Commonly known as: HYDRODIURIL Take 1 tablet (25 mg total) by mouth daily.   levothyroxine 50 MCG tablet Commonly known as: SYNTHROID Take 1 tablet (50 mcg total) by mouth daily before breakfast.   losartan 100 MG tablet Commonly known as: COZAAR  Take 1 tablet (100 mg total) by mouth daily.   Magnesium 400 MG Caps Take 400 mg by mouth daily.   metoprolol tartrate 25 MG tablet Commonly known as: LOPRESSOR Take 0.5 tablets (12.5 mg total) by mouth daily.   OMEGA 3 500 PO Take 1,000 mg by mouth daily. Has 1000mg  capsules   potassium chloride 10 MEQ tablet Commonly known as: KLOR-CON Take 1 tablet (10 mEq total) by mouth daily.   rosuvastatin 20 MG tablet Commonly known as: CRESTOR Take 1 tablet (20 mg total) by mouth daily.   Tdap 5-2.5-18.5 LF-MCG/0.5 injection Commonly known as: BOOSTRIX Inject 0.5 mLs into the muscle once for 1 dose. Started by: [name], MD          Objective:   Physical Exam BP 133/70 (BP Location: Left Arm, Patient Position: Sitting, Cuff Size: Normal)    Pulse (!) 46    Temp 97.9 F (36.6 C) (Oral)    Resp 16    Ht 6\' 1"  (1.854 m)    Wt 287 lb 6 oz (130.4 kg)    SpO2 96%     BMI 37.91 kg/m  General: Well developed, NAD, BMI noted Neck: No  thyromegaly  HEENT:  Normocephalic . Face symmetric, atraumatic Lungs:  CTA B Normal respiratory effort, no intercostal retractions, no accessory muscle use. Heart: RRR, soft syst murmur.  Abdomen:  Not distended, soft, non-tender. No rebound or rigidity.   Lower extremities: no pretibial edema bilaterally  Skin: Exposed areas without rash. Not pale. Not jaundice Neurologic:  alert & oriented X3.  Speech normal, gait appropriate for age and unassisted Strength symmetric and appropriate for age.  Psych: Cognition and judgment appear intact.  Cooperative with normal attention span and concentration.  Behavior appropriate. No anxious or depressed appearing.     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 GERD: Saw GI, PPIs changed to H2 B, doing well.  Had diarrhea also, resolved. CAD: ASX Hypothyroidism: On Synthroid, labs. RTC 6 months  This visit occurred during the SARS-CoV-2 public health emergency.  Safety protocols were in place, including screening questions prior to the visit, additional usage of staff PPE, and extensive cleaning of exam room while observing appropriate contact time as indicated for disinfecting solutions.

## 2020-07-05 ENCOUNTER — Encounter: Payer: Self-pay | Admitting: Internal Medicine

## 2020-07-05 LAB — CBC WITH DIFFERENTIAL/PLATELET
Absolute Monocytes: 512 cells/uL (ref 200–950)
Basophils Absolute: 39 cells/uL (ref 0–200)
Basophils Relative: 0.7 %
Eosinophils Absolute: 160 cells/uL (ref 15–500)
Eosinophils Relative: 2.9 %
HCT: 44.7 % (ref 38.5–50.0)
Hemoglobin: 15.1 g/dL (ref 13.2–17.1)
Lymphs Abs: 2019 cells/uL (ref 850–3900)
MCH: 32.1 pg (ref 27.0–33.0)
MCHC: 33.8 g/dL (ref 32.0–36.0)
MCV: 94.9 fL (ref 80.0–100.0)
MPV: 10.4 fL (ref 7.5–12.5)
Monocytes Relative: 9.3 %
Neutro Abs: 2772 cells/uL (ref 1500–7800)
Neutrophils Relative %: 50.4 %
Platelets: 174 10*3/uL (ref 140–400)
RBC: 4.71 10*6/uL (ref 4.20–5.80)
RDW: 12.1 % (ref 11.0–15.0)
Total Lymphocyte: 36.7 %
WBC: 5.5 10*3/uL (ref 3.8–10.8)

## 2020-07-05 LAB — HEMOGLOBIN A1C
Hgb A1c MFr Bld: 5.5 % of total Hgb (ref <5.7)
Mean Plasma Glucose: 111 (calc)
eAG (mmol/L): 6.2 (calc)

## 2020-07-05 LAB — LIPID PANEL
Cholesterol: 132 mg/dL (ref <200)
HDL: 45 mg/dL (ref >=40)
LDL Cholesterol (Calc): 64 mg/dL (calc)
Non-HDL Cholesterol (Calc): 87 mg/dL (calc) (ref <130)
Total CHOL/HDL Ratio: 2.9 (calc) (ref <5.0)
Triglycerides: 156 mg/dL — ABNORMAL HIGH (ref <150)

## 2020-07-05 LAB — COMPREHENSIVE METABOLIC PANEL
AG Ratio: 2 (calc) (ref 1.0–2.5)
ALT: 23 U/L (ref 9–46)
AST: 23 U/L (ref 10–35)
Albumin: 4.6 g/dL (ref 3.6–5.1)
Alkaline phosphatase (APISO): 60 U/L (ref 35–144)
BUN: 16 mg/dL (ref 7–25)
CO2: 29 mmol/L (ref 20–32)
Calcium: 9.8 mg/dL (ref 8.6–10.3)
Chloride: 103 mmol/L (ref 98–110)
Creat: 1.03 mg/dL (ref 0.70–1.18)
Globulin: 2.3 g/dL (calc) (ref 1.9–3.7)
Glucose, Bld: 105 mg/dL — ABNORMAL HIGH (ref 65–99)
Potassium: 4 mmol/L (ref 3.5–5.3)
Sodium: 141 mmol/L (ref 135–146)
Total Bilirubin: 1.3 mg/dL — ABNORMAL HIGH (ref 0.2–1.2)
Total Protein: 6.9 g/dL (ref 6.1–8.1)

## 2020-07-05 LAB — TSH: TSH: 4.6 mIU/L — ABNORMAL HIGH (ref 0.40–4.50)

## 2020-07-05 NOTE — Assessment & Plan Note (Signed)
-  Td 2011,  Tdap printed -pnm 23- 2012 - prevnar - 2016 -  zostavax 05-2016 (had Bell's Palsy shortly after it,  avoid shingrix for now) - had covid shot - flu shot recommended -CCS: multiple cscopes,  10-2012 Dr Cher Nakai; cscope again 08/2019, next per GI - prostate ca screening: aged out  -labs FLP, CBC, TSH, A1c - diet/exercise: Discussed

## 2020-07-05 NOTE — Assessment & Plan Note (Signed)
Here for CPX Hyperglycemia: Check A1c HTN: Seems well controlled, rec ambulatory BPs, check labs, continue losartan-metoprolol-potassium-HCTZ Hyperlipidemia: On rosuvastatin.Checking labs. GERD: Saw GI, PPIs changed to H2 B, doing well.  Had diarrhea also, resolved. CAD: ASX Hypothyroidism: On Synthroid, labs. RTC 6 months

## 2020-07-07 ENCOUNTER — Encounter: Payer: Self-pay | Admitting: Internal Medicine

## 2020-07-07 MED ORDER — LEVOTHYROXINE SODIUM 75 MCG PO TABS: 75.0000 ug | ORAL_TABLET | Freq: Every day | ORAL | 3 refills | Status: DC

## 2020-07-07 NOTE — Addendum Note (Signed)
Addended byDamita Dunnings D on: 07/07/2020 09:35 AM   Modules accepted: Orders

## 2020-07-11 ENCOUNTER — Other Ambulatory Visit: Payer: Self-pay

## 2020-07-11 DIAGNOSIS — R739 Hyperglycemia, unspecified: Secondary | ICD-10-CM

## 2020-07-12 DIAGNOSIS — H0289 Other specified disorders of eyelid: Secondary | ICD-10-CM | POA: Diagnosis not present

## 2020-07-12 DIAGNOSIS — H04553 Acquired stenosis of bilateral nasolacrimal duct: Secondary | ICD-10-CM | POA: Diagnosis not present

## 2020-07-12 DIAGNOSIS — H04223 Epiphora due to insufficient drainage, bilateral lacrimal glands: Secondary | ICD-10-CM | POA: Diagnosis not present

## 2020-07-27 ENCOUNTER — Other Ambulatory Visit: Payer: Self-pay | Admitting: Internal Medicine

## 2020-07-27 ENCOUNTER — Telehealth: Payer: Self-pay | Admitting: Pharmacist

## 2020-07-27 DIAGNOSIS — R69 Illness, unspecified: Secondary | ICD-10-CM | POA: Diagnosis not present

## 2020-07-27 NOTE — Progress Notes (Addendum)
Verified Adherence Gap Information. per insurance data, patient is 100% compliant with their Losartan Pos tab 100 mg ACEi/Arb medication adherence and Rosuvastatin 20 mg statin medication adherence.  [employer] data through 06-07-20.   [place] Shannon, [facility] Pharmacist Assistant [phone]  Reviewed by: De Blanch, PharmD Clinical Pharmacist [facility] [phone]

## 2020-08-01 NOTE — Progress Notes (Signed)
Patient ID: Steven Munoz, male   DOB: 01-06-1941, 79 y.o.   MRN: [id number]     79 y.o. CABG August 2010 He had ostial LAD, distal RCA/PDA disease and had LIMA to LAD SVG Sequential to PDA and PLB EF normal circumflex not grafted. Dr [name] follow him for OSA and CPAP Activity limited by right hip pain post surgery at [facility] and f/u tendon release. Dr Clent Jacks.  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  All other systems reviewed and negative  General: BP 138/78   Pulse (!) 53   Ht 6\' 1"  (1.854 m)   Wt 284 lb 3.2 oz (128.9 kg)   SpO2 98%   BMI 37.50 kg/m  Affect appropriate Overweight white male  RLE varicosities HEENT: Tinnitus  Neck supple with no adenopathy JVP normal no bruits no thyromegaly Lungs clear with no wheezing and good diaphragmatic motion Heart:  S1/S2 SEM  murmur, no rub, gallop or click PMI normal Abdomen: benighn, BS positve, no tenderness, no AAA no bruit.  No HSM or HJR Distal pulses intact with no bruits Neuro non-focal Skin warm and dry Right TKR    Current Outpatient Medications  Medication Sig Dispense Refill  . aspirin EC 81 MG tablet Take 81 mg by mouth daily.    . Coenzyme Q10 (COQ10) 200 MG CAPS Take by mouth.    . diphenoxylate-atropine (LOMOTIL) 2.5-0.025 MG tablet Take by mouth as needed for diarrhea or loose stools.    . famotidine (PEPCID) 40 MG tablet Take 40 mg by mouth at bedtime.    .  hydrochlorothiazide (HYDRODIURIL) 25 MG tablet Take 1 tablet (25 mg total) by mouth daily. 90 tablet 1  . levothyroxine (SYNTHROID) 75 MCG tablet Take 1 tablet (75 mcg total) by mouth daily before breakfast. 30 tablet 3  . losartan (COZAAR) 100 MG tablet Take 1 tablet (100 mg total) by mouth daily. 90 tablet 1  . Magnesium 400 MG CAPS Take 400 mg by mouth daily.    . metoprolol tartrate (LOPRESSOR) 25 MG tablet Take 0.5 tablets (12.5 mg total) by mouth daily. 45 tablet 2  . Multiple Vitamins-Minerals (CENTRUM SILVER PO) Take 1 tablet by mouth daily.    . Multiple Vitamins-Minerals (PRESERVISION AREDS 2+MULTI VIT) CAPS Take 1 capsule by mouth 2 (two) times daily.     . Omega-3 Fatty Acids (OMEGA 3 500 PO) Take 1,000 mg by mouth daily. Has 1000mg  capsules    . potassium chloride (KLOR-CON) 10 MEQ tablet Take 1 tablet (10 mEq total) by mouth daily. 90 tablet 0  . rosuvastatin (CRESTOR) 20 MG tablet Take 1 tablet (20 mg total) by mouth daily. 90 tablet 1   No current facility-administered medications for this visit.    Allergies  Povidone-iodine, Ofloxacin, and Tape  Electrocardiogram:  08/14/20 SR rate 53 normal   Assessment and Plan  Murmur:  Reviewed echo from 12/08/17 EF 55-60% AV sclerosis trivial MR no significant valve Dx  CAD/CABG: 2010  lima to LAD and SVG to PDA/PLB no angina continue medical Rx   PAF: post op no recurrence ASA and beta blocker LA severely dilated on most echo  12/08/17 57 mm Puts him at increased risk of recurrence   HTN: Well controlled.  Continue current medications and low sodium Dash type diet.    Thyroid:  Lab Results  Component Value Date   TSH 4.60 (H) 07/04/2020    Cholesterol  Lab Results  Component Value Date   LDLCALC 64 07/04/2020    Ortho: post right hip surgery [name]  Post tendon release in June 2018  with relief of pain  First Degree: no change on ECG today no high grade AV block   ENT:  F/u [facility] bells palsy resolved still with  tinnitus wearing hearing aids post tear duct surgery   F/U in a year    [employer]

## 2020-08-14 ENCOUNTER — Ambulatory Visit: Payer: Medicare HMO | Admitting: Cardiovascular Disease

## 2020-08-14 ENCOUNTER — Encounter: Payer: Self-pay | Admitting: Cardiovascular Disease

## 2020-08-14 ENCOUNTER — Other Ambulatory Visit: Payer: Self-pay

## 2020-08-14 VITALS — BP 138/78 | HR 53 | Ht 73.0 in | Wt 284.2 lb

## 2020-08-14 DIAGNOSIS — I25810 Atherosclerosis of coronary artery bypass graft(s) without angina pectoris: Secondary | ICD-10-CM

## 2020-08-14 NOTE — Patient Instructions (Addendum)

## 2020-08-16 ENCOUNTER — Telehealth: Payer: Medicare HMO

## 2020-08-18 NOTE — Addendum Note (Signed)
Addended by: Kelle Darting A on: 08/18/2020 08:46 AM   Modules accepted: Orders

## 2020-08-19 ENCOUNTER — Other Ambulatory Visit: Payer: Self-pay | Admitting: Internal Medicine

## 2020-08-21 ENCOUNTER — Other Ambulatory Visit: Payer: Medicare HMO

## 2020-08-21 ENCOUNTER — Other Ambulatory Visit: Payer: Self-pay

## 2020-08-21 DIAGNOSIS — R739 Hyperglycemia, unspecified: Secondary | ICD-10-CM

## 2020-08-21 DIAGNOSIS — E039 Hypothyroidism, unspecified: Secondary | ICD-10-CM

## 2020-08-22 ENCOUNTER — Other Ambulatory Visit: Payer: Self-pay | Admitting: Internal Medicine

## 2020-08-22 DIAGNOSIS — I10 Essential (primary) hypertension: Secondary | ICD-10-CM

## 2020-08-22 LAB — TSH: TSH: 5.39 mIU/L — ABNORMAL HIGH (ref 0.40–4.50)

## 2020-08-22 LAB — BASIC METABOLIC PANEL
BUN: 17 mg/dL (ref 7–25)
CO2: 30 mmol/L (ref 20–32)
Calcium: 9.7 mg/dL (ref 8.6–10.3)
Chloride: 102 mmol/L (ref 98–110)
Creat: 0.84 mg/dL (ref 0.70–1.18)
Glucose, Bld: 110 mg/dL — ABNORMAL HIGH (ref 65–99)
Potassium: 4.2 mmol/L (ref 3.5–5.3)
Sodium: 140 mmol/L (ref 135–146)

## 2020-08-23 MED ORDER — LEVOTHYROXINE SODIUM 125 MCG PO TABS: 125.0000 ug | ORAL_TABLET | Freq: Every day | ORAL | 2 refills | Status: DC

## 2020-08-23 NOTE — Addendum Note (Signed)
Addended byDamita Dunnings D on: 08/23/2020 03:08 PM   Modules accepted: Orders

## 2020-08-25 ENCOUNTER — Ambulatory Visit: Payer: Medicare HMO | Admitting: Pharmacist

## 2020-08-25 DIAGNOSIS — R739 Hyperglycemia, unspecified: Secondary | ICD-10-CM

## 2020-08-25 DIAGNOSIS — E039 Hypothyroidism, unspecified: Secondary | ICD-10-CM

## 2020-08-25 DIAGNOSIS — E785 Hyperlipidemia, unspecified: Secondary | ICD-10-CM

## 2020-08-25 DIAGNOSIS — I10 Essential (primary) hypertension: Secondary | ICD-10-CM

## 2020-08-25 NOTE — Patient Instructions (Signed)
Visit Information  Goals Addressed            This Visit's Progress   . Chronic Care Management Pharmacy Care Plan       CARE PLAN ENTRY (see longitudinal plan of care for additional care plan information)  Current Barriers:  . Chronic Disease Management support, education, and care coordination needs related to AFib, HTN, HLD, CAD, Hypothyroidism, Hypokalemia, GERD   Hypertension BP Readings from Last 3 Encounters:  08/14/20 138/78  07/04/20 133/70  01/03/20 (!) 155/58   . Pharmacist Clinical Goal(s): o Over the next 180 days, patient will work with PharmD and providers to maintain BP goal <130/80 . Current regimen:   Losartan 100mg  daily  Hctz 25mg  daily  Metoprolol tartrate 25mg  1/2 tab daily . Interventions: o Discussed BP goal . Patient self care activities - Over the next 180 days, patient will: o Maintain hypertension medication regimen.   Hyperlipidemia Lab Results  Component Value Date/Time   LDLCALC 64 07/04/2020 09:47 AM   . Pharmacist Clinical Goal(s): o Over the next 180 days, patient will work with PharmD and providers to maintain LDL goal < 70 . Current regimen:  o Rosuvastatin 20mg  daily . Interventions: o Discussed importance of adherence . Patient self care activities - Over the next 180 days, patient will: o Maintain cholesterol medication regimen.   Hypothyroidism . Pharmacist Clinical Goal(s) o Over the next 90 days, patient will work with PharmD and providers to achieve TSH within normal limits . Current regimen:  o Levothyroxine 145mcg daily . Patient self care activities - Over the next 90 days, patient will: o Maintain hypothyroidism medication regimen  Query Pre-Diabetes Lab Results  Component Value Date/Time   HGBA1C 5.5 07/04/2020 09:47 AM   HGBA1C 5.7 07/06/2019 09:42 AM   . Pharmacist Clinical Goal(s): o Over the next 180 days, patient will work with PharmD and providers to maintain A1c goal  <5.7% . Current regimen:   o Diet and exercise management   . Patient self care activities - Over the next 180 days, patient will: o Maintain a1c <5.7%  Medication management . Pharmacist Clinical Goal(s): o Over the next 180 days, patient will work with PharmD and providers to maintain optimal medication adherence . Current pharmacy: [facility] . Interventions o Comprehensive medication review performed. o Continue current medication management strategy . Patient self care activities - Over the next 180 days, patient will: o Focus on medication adherence by filling and taking medications appropriately  o Take medications as prescribed o Report any questions or concerns to PharmD and/or provider(s)  Please see past updates related to this goal by clicking on the "Past Updates" button in the selected goal        The patient verbalized understanding of instructions provided today and agreed to receive a mailed copy of patient instruction and/or educational materials.  Telephone follow up appointment with pharmacy team member scheduled for: 02/23/2021  Melvenia Beam Shavonne Ambroise, PharmD Clinical Pharmacist [facility] [phone]

## 2020-08-25 NOTE — Chronic Care Management (AMB) (Signed)
Chronic Care Management Pharmacy  Name: Steven Munoz  MRN: [id number] DOB: 1941/08/20  Chief Complaint/ HPI  Steven Munoz,  79 y.o. , male presents for their Follow-Up CCM visit with the clinical pharmacist via telephone due to COVID-19 Pandemic.  PCP : Colon Branch, MD  Their chronic conditions include: AFib, HTN, HLD, CAD, Hypothyroidism, Hypokalemia, GERD  Office Visits: 07/04/20: Visit w/ Dr. Larose Kells - TSH elevated. Levothyroxine increased to 62mg daily. At lab visit on 08/23/20, TSH still elevated, increase levothyroxine to 1277m.   Consult Visit: 08/21/20: GaGertie Feyisit w/ CoMacky LowerPA-C - Banding procedure scheduled.   08/14/20: Cardio visit w/ Dr. NiJohnsie Cancel No med changes noted.   Medications: Outpatient Encounter Medications as of 08/25/2020  Medication Sig  . aspirin EC 81 MG tablet Take 81 mg by mouth daily.  . Coenzyme Q10 (COQ10) 200 MG CAPS Take by mouth.  . diphenoxylate-atropine (LOMOTIL) 2.5-0.025 MG tablet Take by mouth as needed for diarrhea or loose stools.  . famotidine (PEPCID) 40 MG tablet Take 40 mg by mouth at bedtime.  . hydrochlorothiazide (HYDRODIURIL) 25 MG tablet Take 1 tablet (25 mg total) by mouth daily.  . Marland Kitchenevothyroxine (SYNTHROID) 125 MCG tablet Take 1 tablet (125 mcg total) by mouth daily before breakfast.  . losartan (COZAAR) 100 MG tablet Take 1 tablet (100 mg total) by mouth daily.  . Magnesium 400 MG CAPS Take 400 mg by mouth daily.  . metoprolol tartrate (LOPRESSOR) 25 MG tablet Take 0.5 tablets (12.5 mg total) by mouth daily.  . Multiple Vitamins-Minerals (CENTRUM SILVER PO) Take 1 tablet by mouth daily.  . Multiple Vitamins-Minerals (PRESERVISION AREDS 2+MULTI VIT) CAPS Take 1 capsule by mouth 2 (two) times daily.   . Omega-3 Fatty Acids (OMEGA 3 500 PO) Take 1,000 mg by mouth daily. Has 100025mapsules  . potassium chloride (KLOR-CON) 10 MEQ tablet Take 1 tablet (10 mEq total) by mouth daily.  . rosuvastatin (CRESTOR) 20 MG tablet  Take 1 tablet (20 mg total) by mouth daily.   No facility-administered encounter medications on file as of 08/25/2020.    SDOH Screenings   Alcohol Screen:   . Last Alcohol Screening Score (AUDIT): Not on file  Depression (PHQ2-9): Low Risk   . PHQ-2 Score: 0  Financial Resource Strain: Low Risk   . Difficulty of Paying Living Expenses: Not very hard  Food Insecurity:   . Worried About RunCharity fundraiser the Last Year: Not on file  . Ran Out of Food in the Last Year: Not on file  Housing:   . Last Housing Risk Score: Not on file  Physical Activity:   . Days of Exercise per Week: Not on file  . Minutes of Exercise per Session: Not on file  Social Connections:   . Frequency of Communication with Friends and Family: Not on file  . Frequency of Social Gatherings with Friends and Family: Not on file  . Attends Religious Services: Not on file  . Active Member of Clubs or Organizations: Not on file  . Attends CluArchivistetings: Not on file  . Marital Status: Not on file  Stress:   . Feeling of Stress : Not on file  Tobacco Use: Medium Risk  . Smoking Tobacco Use: Former Smoker  . Smokeless Tobacco Use: Never Used  Transportation Needs:   . LacFilm/video editoredical): Not on file  . Lack of Transportation (Non-Medical): Not on file    Current Diagnosis/Assessment:  Goals Addressed            This Visit's Progress   . Chronic Care Management Pharmacy Care Plan       CARE PLAN ENTRY (see longitudinal plan of care for additional care plan information)  Current Barriers:  . Chronic Disease Management support, education, and care coordination needs related to AFib, HTN, HLD, CAD, Hypothyroidism, Hypokalemia, GERD   Hypertension BP Readings from Last 3 Encounters:  08/14/20 138/78  07/04/20 133/70  01/03/20 (!) 155/58   . Pharmacist Clinical Goal(s): o Over the next 180 days, patient will work with PharmD and providers to maintain BP goal  <130/80 . Current regimen:   Losartan 184m daily  Hctz 210mdaily  Metoprolol tartrate 2554m/2 tab daily . Interventions: o Discussed BP goal . Patient self care activities - Over the next 180 days, patient will: o Maintain hypertension medication regimen.   Hyperlipidemia Lab Results  Component Value Date/Time   LDLCALC 64 07/04/2020 09:47 AM   . Pharmacist Clinical Goal(s): o Over the next 180 days, patient will work with PharmD and providers to maintain LDL goal < 70 . Current regimen:  o Rosuvastatin 13m72mily . Interventions: o Discussed importance of adherence . Patient self care activities - Over the next 180 days, patient will: o Maintain cholesterol medication regimen.   Hypothyroidism . Pharmacist Clinical Goal(s) o Over the next 90 days, patient will work with PharmD and providers to achieve TSH within normal limits . Current regimen:  o Levothyroxine 125mc61mily . Patient self care activities - Over the next 90 days, patient will: o Maintain hypothyroidism medication regimen  Query Pre-Diabetes Lab Results  Component Value Date/Time   HGBA1C 5.5 07/04/2020 09:47 AM   HGBA1C 5.7 07/06/2019 09:42 AM   . Pharmacist Clinical Goal(s): o Over the next 180 days, patient will work with PharmD and providers to maintain A1c goal  <5.7% . Current regimen:  o Diet and exercise management   . Patient self care activities - Over the next 180 days, patient will: o Maintain a1c <5.7%  Medication management . Pharmacist Clinical Goal(s): o Over the next 180 days, patient will work with PharmD and providers to maintain optimal medication adherence . Current pharmacy: [facility] . Interventions o Comprehensive medication review performed. o Continue current medication management strategy . Patient self care activities - Over the next 180 days, patient will: o Focus on medication adherence by filling and taking medications appropriately  o Take medications as  prescribed o Report any questions or concerns to PharmD and/or provider(s)  Please see past updates related to this goal by clicking on the "Past Updates" button in the selected goal       Plans to go back to the gym tomorrow Mar[place]   Query Pre-Diabetes   Recent Relevant Labs: Lab Results  Component Value Date/Time   HGBA1C 5.5 07/04/2020 09:47 AM   HGBA1C 5.7 07/06/2019 09:42 AM   MICROALBUR 1.2 11/26/2006 12:00 AM    Patient is currently controlled on the following medications: None   We discussed: diet and exercise extensively   Started back going to the gym about 3 weeks ago. Going to the gym 3 times a week for 1 hr - 1.5 hrs.  Wears mask at gym  Update 08/25/20 Patient now below pre-diabetes range. Congratulated patient on this!   Plan -Continue control with diet and exercise  Hyperlipidemia/CAD   LDL goal <70  Last lipids Lab Results  Component Value Date   CHOL 132 07/04/2020   HDL 45 07/04/2020   LDLCALC 64 07/04/2020   TRIG 156 (H) 07/04/2020   CHOLHDL 2.9 07/04/2020   Hepatic Function Latest Ref Rng & Units 07/04/2020 07/06/2019 06/22/2018  Total Protein 6.1 - 8.1 g/dL 6.9 6.9 7.1  Albumin 3.5 - 5.2 g/dL - 4.3 4.5  AST 10 - 35 U/L '23 17 17  ' ALT 9 - 46 U/L '23 16 16  ' Alk Phosphatase 39 - 117 U/L - 63 66  Total Bilirubin 0.2 - 1.2 mg/dL 1.3(H) 1.2 1.2  Bilirubin, Direct 0.0 - 0.3 mg/dL - - -     The 10-year ASCVD risk score Mikey Bussing DC Jr., et al., 2013) is: 37.5%   Values used to calculate the score:     Age: 50 years     Sex: Male     Is Non-Hispanic African American: No     Diabetic: No     Tobacco smoker: No     Systolic Blood Pressure: 921 mmHg     Is BP treated: Yes     HDL Cholesterol: 45 mg/dL     Total Cholesterol: 132 mg/dL   Patient has failed these meds in past: None noted  Patient is currently controlled on the following medications:  . Rosuvastatin 37m daily  We  discussed:  Importance of adherence   Update 08/25/20 States he is adherent to therapy.  Plan -Continue current medications   Hypertension   Blood Pressure Goal <130/80  CMP Latest Ref Rng & Units 08/21/2020 07/04/2020 01/03/2020  Glucose 65 - 99 mg/dL 110(H) 105(H) 108(H)  BUN 7 - 25 mg/dL '17 16 16  ' Creatinine 0.70 - 1.18 mg/dL 0.84 1.03 0.87  Sodium 135 - 146 mmol/L 140 141 139  Potassium 3.5 - 5.3 mmol/L 4.2 4.0 3.9  Chloride 98 - 110 mmol/L 102 103 101  CO2 20 - 32 mmol/L '30 29 30  ' Calcium 8.6 - 10.3 mg/dL 9.7 9.8 9.5  Total Protein 6.1 - 8.1 g/dL - 6.9 -  Total Bilirubin 0.2 - 1.2 mg/dL - 1.3(H) -  Alkaline Phos 39 - 117 U/L - - -  AST 10 - 35 U/L - 23 -  ALT 9 - 46 U/L - 23 -   Lab Results  Component Value Date   CREATININE 0.84 08/21/2020   BUN 17 08/21/2020   GFR 84.63 01/03/2020   GFRNONAA 85.51 05/18/2010   GFRAA  06/01/2009    >60        The eGFR has been calculated using the MDRD equation. This calculation has not been validated in all clinical situations. eGFR's persistently <60 mL/min signify possible Chronic Kidney Disease.   NA 140 08/21/2020   K 4.2 08/21/2020   CALCIUM 9.7 08/21/2020   CO2 30 08/21/2020   Office blood pressures are  BP Readings from Last 3 Encounters:  08/14/20 138/78  07/04/20 133/70  01/03/20 (!) 155/58   Patient has failed these meds in the past: None noted  Patient is currently controlled at home on the following medications:   Losartan 1037mdaily  Hctz 2544maily  Metoprolol tartrate 40m35m2 tab daily  Denies dizziness, lightheadedness, chest pain  Patient checks BP at home 3-5x per week  Patient home BP readings are ranging: He did 39 readings since last visit.  Systolic Avg: 137.194.1stolic Avg: Did not average it out, but states it stays around 65 Pulse Avg: Did not average it out,  but states it stays around 55  Update 24/46/28 638/17 53 Systolic Avg: 711.6   Plan -Check blood pressure 2-3 times  per week -Continue current medications   Hypothyroidism   Lab Results  Component Value Date/Time   TSH 5.39 (H) 08/21/2020 08:43 AM   TSH 4.60 (H) 07/04/2020 09:47 AM    Patient has failed these meds in past: None noted  Patient is currently uncontrolled on the following medications:  . Levothyroxine 169mg daily  Scheduled for lab visit 10/04/20 Feels he has been using the same generic  Plan -Continue current medications  GERD    Patient has failed these meds in past: None noted  Patient is currently controlled on the following medications:   Lomotil 2.5-0.025mg as needed (takes about 3 per Shizuye Rupert)  Breakthrough Sx: None  Had esophagus stretched in the past Has taken omeprazole ever since (~2004)  Hasn't spoken to Dr. MEarlean Shawlabout omeprazole yet, but states he will soon  We discussed:  Possibility of D/C of omeprazole. He will discuss with Dr. MEarlean Shawl Update 08/25/20 Stopped taking omeprazole and this "has helped quite a bit" Started taking lomotil for diarrhea. Denies dry mouth or trouble urinating.  Had discussions about using Creon, but this might be limited by income. He doesn't feel he would qualify for patient assistance  Plan -Continue current medications   KDe Blanch PharmD Clinical Pharmacist [phone]

## 2020-08-26 ENCOUNTER — Ambulatory Visit: Payer: Medicare HMO | Attending: Internal Medicine

## 2020-08-26 DIAGNOSIS — Z23 Encounter for immunization: Secondary | ICD-10-CM

## 2020-08-26 NOTE — Progress Notes (Signed)
   Covid-19 Vaccination Clinic  Name:  RAHUL MALINAK    MRN: [id number] DOB: [birth year]  08/26/2020  Mr. Marmolejos was observed post Covid-19 immunization for 15 minutes without incident. He was provided with Vaccine Information Sheet and instruction to access the V-Safe system.   Mr. Hicks was instructed to call 911 with any severe reactions post vaccine: Marland Kitchen Difficulty breathing  . Swelling of face and throat  . A fast heartbeat  . A bad rash all over body  . Dizziness and weakness

## 2020-08-30 DIAGNOSIS — H0289 Other specified disorders of eyelid: Secondary | ICD-10-CM | POA: Diagnosis not present

## 2020-08-30 DIAGNOSIS — H04222 Epiphora due to insufficient drainage, left lacrimal gland: Secondary | ICD-10-CM | POA: Diagnosis not present

## 2020-08-30 DIAGNOSIS — H04553 Acquired stenosis of bilateral nasolacrimal duct: Secondary | ICD-10-CM | POA: Diagnosis not present

## 2020-09-06 DIAGNOSIS — R151 Fecal smearing: Secondary | ICD-10-CM | POA: Diagnosis not present

## 2020-09-06 DIAGNOSIS — K625 Hemorrhage of anus and rectum: Secondary | ICD-10-CM | POA: Diagnosis not present

## 2020-09-06 DIAGNOSIS — K8681 Exocrine pancreatic insufficiency: Secondary | ICD-10-CM | POA: Diagnosis not present

## 2020-10-02 DIAGNOSIS — H0289 Other specified disorders of eyelid: Secondary | ICD-10-CM | POA: Diagnosis not present

## 2020-10-03 NOTE — Addendum Note (Signed)
Addended by: Kelle Darting A on: 10/03/2020 01:24 PM   Modules accepted: Orders

## 2020-10-04 ENCOUNTER — Other Ambulatory Visit (INDEPENDENT_AMBULATORY_CARE_PROVIDER_SITE_OTHER): Payer: Medicare HMO

## 2020-10-04 ENCOUNTER — Other Ambulatory Visit: Payer: Self-pay

## 2020-10-04 DIAGNOSIS — E039 Hypothyroidism, unspecified: Secondary | ICD-10-CM | POA: Diagnosis not present

## 2020-10-04 LAB — TSH: TSH: 1.52 u[IU]/mL (ref 0.35–4.50)

## 2020-10-06 ENCOUNTER — Other Ambulatory Visit: Payer: Self-pay

## 2020-10-06 MED ORDER — LEVOTHYROXINE SODIUM 125 MCG PO TABS
125.0000 ug | ORAL_TABLET | Freq: Every day | ORAL | 1 refills | Status: DC
Start: 2020-10-06 — End: 2020-11-17

## 2020-10-10 ENCOUNTER — Other Ambulatory Visit: Payer: Self-pay | Admitting: Internal Medicine

## 2020-10-13 ENCOUNTER — Other Ambulatory Visit: Payer: Self-pay | Admitting: Internal Medicine

## 2020-10-28 HISTORY — PX: CATARACT EXTRACTION: SUR2

## 2020-10-30 ENCOUNTER — Telehealth: Payer: Self-pay | Admitting: Pharmacist

## 2020-10-30 NOTE — Progress Notes (Addendum)
Chronic Care Management Pharmacy Assistant   Name: Steven Munoz  MRN: [id number] DOB: 04-18-1941  Reason for Encounter: HTN Disease State  Patient Questions:  1.  Have you seen any other providers since your last visit? Yes  2.  Any changes in your medicines or health? Yes    PCP : Colon Branch, MD   Their chronic conditions include: AFib, HTN, HLD, CAD, Hypothyroidism, Hypokalemia, GERD  Office Visits: None since their last CCM visit with the clinical pharmacist on 08-25-2020.  Consults: 10-02-2020 (Ophthalmology) OV with Ramon[facility] Lab for Bilateral Floppy Eyelid Syndrome of Eyes.  09-06-2020 Gertie Fey) Patient presented in the office with symptoms of bleeding and rectal leakage. Was advised to take Lomotil 2 tablets QHS and may use further tablets if needed during the day. Also advised to use calmoseptine ointment following your bathing routine and after bowel movements and start taking 1-2 tbsp of benefiber daily in any liquid.  08-30-2020 (Ophthalmology) [place] visit with Ramonita Lab. Epiphora Due to Insufficient Drainage; Acquired Nasolacrimal Duct Obstruction; Bilateral Floppy Eyelid Syndrome of Eyes. No medication changes  Allergies:   Allergies  Allergen Reactions   Povidone-Iodine     REACTION: rash locally   Ofloxacin     Affected sleep with eye drop formulation   Tape Rash    Cloth adhesive tape-per patient Cloth adhesive tape-per patient Cloth adhesive tape-per patient    Medications: Outpatient Encounter Medications as of 10/30/2020  Medication Sig   aspirin EC 81 MG tablet Take 81 mg by mouth daily.   Coenzyme Q10 (COQ10) 200 MG CAPS Take by mouth.   diphenoxylate-atropine (LOMOTIL) 2.5-0.025 MG tablet Take by mouth as needed for diarrhea or loose stools.   famotidine (PEPCID) 40 MG tablet Take 40 mg by mouth at bedtime.   hydrochlorothiazide (HYDRODIURIL) 25 MG tablet Take 1 tablet (25 mg total) by mouth daily.   levothyroxine (SYNTHROID) 125 MCG tablet Take 1  tablet (125 mcg total) by mouth daily before breakfast.   losartan (COZAAR) 100 MG tablet Take 1 tablet (100 mg total) by mouth daily.   Magnesium 400 MG CAPS Take 400 mg by mouth daily.   metoprolol tartrate (LOPRESSOR) 25 MG tablet Take 0.5 tablets (12.5 mg total) by mouth daily.   Multiple Vitamins-Minerals (CENTRUM SILVER PO) Take 1 tablet by mouth daily.   Multiple Vitamins-Minerals (PRESERVISION AREDS 2+MULTI VIT) CAPS Take 1 capsule by mouth 2 (two) times daily.    Omega-3 Fatty Acids (OMEGA 3 500 PO) Take 1,000 mg by mouth daily. Has 1020m capsules   potassium chloride (KLOR-CON) 10 MEQ tablet Take 1 tablet (10 mEq total) by mouth daily.   rosuvastatin (CRESTOR) 20 MG tablet Take 1 tablet (20 mg total) by mouth daily.   No facility-administered encounter medications on file as of 10/30/2020.    Current Diagnosis: Patient Active Problem List   Diagnosis Date Noted   Annual physical exam 06/06/2016   PCP NOTES >>>>>>>>>>>> 02/13/2016   CAD (coronary artery disease) 02/13/2016   Pedal edema 01/25/2016   Obesity (BMI 30-39.9) 06/02/2014   CTS (carpal tunnel syndrome) 06/01/2014   Varicose veins 04/07/2014   Prolonged P-R interval 03/04/2013   NEPHROLITHIASIS, HX OF 05/18/2010   ATRIAL FIBRILLATION 06/20/2009   CORONARY ARTERY BYPASS GRAFT, HX OF 06/20/2009   PREMATURE VENTRICULAR CONTRACTIONS 05/09/2009   MURMUR 05/08/2009   Essential hypertension 04/10/2009   DIVERTICULOSIS, COLON 04/10/2009   G E R D 08/05/2008   OSA on CPAP 08/05/2008   DERMATOPHYTOSIS OF  NAIL 07/29/2008   Nocturia 05/30/2008   Hypothyroidism 02/15/2008   Elevated lipids 02/15/2008   History of gout 16/42/9037   DYSMETABOLIC SYNDROME X 95/58/3167   History of colonic polyps 01/14/2008    Goals Addressed   None    Reviewed chart prior to disease state call. Spoke with patient regarding BP  Recent Office Vitals: BP Readings from Last 3 Encounters:  08/14/20 138/78  07/04/20 133/70  01/03/20 (!)  155/58   Pulse Readings from Last 3 Encounters:  08/14/20 (!) 53  07/04/20 (!) 46  01/03/20 (!) 50    Wt Readings from Last 3 Encounters:  08/14/20 284 lb 3.2 oz (128.9 kg)  07/04/20 287 lb 6 oz (130.4 kg)  01/03/20 285 lb (129.3 kg)     Kidney Function Lab Results  Component Value Date/Time   CREATININE 0.84 08/21/2020 08:43 AM   CREATININE 1.03 07/04/2020 09:47 AM   GFR 84.63 01/03/2020 09:33 AM   GFRNONAA 85.51 05/18/2010 12:00 AM   GFRAA  06/01/2009 04:45 AM    >60        The eGFR has been calculated using the MDRD equation. This calculation has not been validated in all clinical situations. eGFR's persistently <60 mL/min signify possible Chronic Kidney Disease.    BMP Latest Ref Rng & Units 08/21/2020 07/04/2020 01/03/2020  Glucose 65 - 99 mg/dL 110(H) 105(H) 108(H)  BUN 7 - 25 mg/dL '17 16 16  ' Creatinine 0.70 - 1.18 mg/dL 0.84 1.03 0.87  BUN/Creat Ratio 6 - 22 (calc) NOT APPLICABLE NOT APPLICABLE -  Sodium 425 - 146 mmol/L 140 141 139  Potassium 3.5 - 5.3 mmol/L 4.2 4.0 3.9  Chloride 98 - 110 mmol/L 102 103 101  CO2 20 - 32 mmol/L '30 29 30  ' Calcium 8.6 - 10.3 mg/dL 9.7 9.8 9.5    Current antihypertensive regimen:  Losartan 100 mg daily Hctz 25 mg daily Metoprolol tartrate 25 mg 1/2 tab daily  How often are you checking your Blood Pressure? Patient states he usually checks his blood pressure daily, but has not over the last four weeks.  Current home BP readings: None availble  What recent interventions/DTPs have been made by any provider to improve Blood Pressure control since last CPP Visit: none  Any recent hospitalizations or ED visits since last visit with CPP? No   What diet changes have been made to improve Blood Pressure Control?  Patient reports removing salt from his diet.  What exercise is being done to improve your Blood Pressure Control?  Patient reports being active in his yard and things around the house.  Adherence Review: Is the patient  currently on ACE/ARB medication? Yes Losartan 100 mg daily Does the patient have >5 day gap between last estimated fill dates? No    Follow-Up:  Pharmacist Review   Fanny Skates, [facility] Pharmacist Assistant [phone]  While patient has appropriate fill dates for losartan. His fill dates seem inappropriate for rosuvastatin.   Noted >5 day gap history per dispense report. Need to assure adherence. -[facility] Fill Dates Per Dispense Report - Filled Inappropriately Rosuvastatin: 07/05/20 90DS  8 minutes spent in review, coordination, and documentation.   Reviewed by: De Blanch, PharmD, BCACP Clinical Pharmacist [facility] [phone]

## 2020-11-17 ENCOUNTER — Other Ambulatory Visit: Payer: Self-pay | Admitting: Internal Medicine

## 2020-11-28 DIAGNOSIS — H57813 Brow ptosis, bilateral: Secondary | ICD-10-CM | POA: Diagnosis not present

## 2020-11-28 DIAGNOSIS — H2513 Age-related nuclear cataract, bilateral: Secondary | ICD-10-CM | POA: Diagnosis not present

## 2020-11-28 DIAGNOSIS — H02421 Myogenic ptosis of right eyelid: Secondary | ICD-10-CM | POA: Diagnosis not present

## 2020-11-28 DIAGNOSIS — H02834 Dermatochalasis of left upper eyelid: Secondary | ICD-10-CM | POA: Diagnosis not present

## 2020-11-28 DIAGNOSIS — H02831 Dermatochalasis of right upper eyelid: Secondary | ICD-10-CM | POA: Diagnosis not present

## 2020-11-30 ENCOUNTER — Telehealth: Payer: Self-pay | Admitting: Pharmacist

## 2020-11-30 NOTE — Progress Notes (Addendum)
Chronic Care Management Pharmacy Assistant   Name: [name]  MRN: [id number] DOB: 06/13/1941  Reason for Encounter: Disease State For CHL.  Patient Questions:  1.  Have you seen any other providers since your last visit?  No.   2.  Any changes in your medicines or health? No.    PCP : Colon Branch, MD   Their chronic conditions include: AFib, HTN, HLD, CAD, Hypothyroidism, Hypokalemia, GERD.  Office Visits: None since 10/30/20  Consults: None since 10/30/20  Allergies:   Allergies  Allergen Reactions   Povidone-Iodine     REACTION: rash locally   Ofloxacin     Affected sleep with eye drop formulation   Tape Rash    Cloth adhesive tape-per patient Cloth adhesive tape-per patient Cloth adhesive tape-per patient    Medications: Outpatient Encounter Medications as of 11/30/2020  Medication Sig   aspirin EC 81 MG tablet Take 81 mg by mouth daily.   Coenzyme Q10 (COQ10) 200 MG CAPS Take by mouth.   diphenoxylate-atropine (LOMOTIL) 2.5-0.025 MG tablet Take by mouth as needed for diarrhea or loose stools.   famotidine (PEPCID) 40 MG tablet Take 40 mg by mouth at bedtime.   hydrochlorothiazide (HYDRODIURIL) 25 MG tablet Take 1 tablet (25 mg total) by mouth daily.   levothyroxine (SYNTHROID) 125 MCG tablet Take 1 tablet (125 mcg total) by mouth daily before breakfast.   losartan (COZAAR) 100 MG tablet Take 1 tablet (100 mg total) by mouth daily.   Magnesium 400 MG CAPS Take 400 mg by mouth daily.   metoprolol tartrate (LOPRESSOR) 25 MG tablet Take 0.5 tablets (12.5 mg total) by mouth daily.   Multiple Vitamins-Minerals (CENTRUM SILVER PO) Take 1 tablet by mouth daily.   Multiple Vitamins-Minerals (PRESERVISION AREDS 2+MULTI VIT) CAPS Take 1 capsule by mouth 2 (two) times daily.    Omega-3 Fatty Acids (OMEGA 3 500 PO) Take 1,000 mg by mouth daily. Has 1000mg  capsules   potassium chloride (KLOR-CON) 10 MEQ tablet Take 1 tablet (10 mEq total) by mouth daily.    rosuvastatin (CRESTOR) 20 MG tablet Take 1 tablet (20 mg total) by mouth daily.   No facility-administered encounter medications on file as of 11/30/2020.    Current Diagnosis: Patient Active Problem List   Diagnosis Date Noted   Annual physical exam 06/06/2016   PCP NOTES >>>>>>>>>>>> 02/13/2016   CAD (coronary artery disease) 02/13/2016   Pedal edema 01/25/2016   Obesity (BMI 30-39.9) 06/02/2014   CTS (carpal tunnel syndrome) 06/01/2014   Varicose veins 04/07/2014   Prolonged P-R interval 03/04/2013   NEPHROLITHIASIS, HX OF 05/18/2010   ATRIAL FIBRILLATION 06/20/2009   CORONARY ARTERY BYPASS GRAFT, HX OF 06/20/2009   PREMATURE VENTRICULAR CONTRACTIONS 05/09/2009   MURMUR 05/08/2009   Essential hypertension 04/10/2009   DIVERTICULOSIS, COLON 04/10/2009   G E R D 08/05/2008   OSA on CPAP 08/05/2008   DERMATOPHYTOSIS OF NAIL 07/29/2008   Nocturia 05/30/2008   Hypothyroidism 02/15/2008   Elevated lipids 02/15/2008   History of gout 44/12/4740   DYSMETABOLIC SYNDROME X 59/56/3875   History of colonic polyps 01/14/2008    Goals Addressed   None    11/30/2020 Name: Steven Munoz MRN: [id number] DOB: 1941/02/04 Steven Munoz is a 80 y.o. year old male who is a primary care patient of Colon Branch, MD.  Comprehensive medication review performed; Spoke to patient regarding cholesterol  Lipid Panel    Component Value Date/Time   CHOL 132 07/04/2020 0947  TRIG 156 (H) 07/04/2020 0947   HDL 45 07/04/2020 0947   LDLCALC 64 07/04/2020 0947    10-year ASCVD risk score: The ASCVD Risk score Mikey Bussing DC Jr., et al., 2013) failed to calculate for the following reasons:   The 2013 ASCVD risk score is only valid for ages 37 to 15  Current antihyperlipidemic regimen:  Rosuvastatin 20 mg daily  Previous antihyperlipidemic medications tried: None Noted  ASCVD risk enhancing conditions: age >18 and HTN   What recent interventions/DTPs have been made by any provider to improve  Cholesterol control since last CPP Visit: None.  Any recent hospitalizations or ED visits since last visit with CPP? No.  What diet changes have been made to improve Cholesterol?  Patient stated he eats too much, he's eating bread and sweets again and would like to loose weight. He stated he doesn't any fired foods at home. Patient stated he does eat meat, vegetables and fruit. Patient stated his fluid intake is 1 cup pf coffee in the morning and water during the day. He also stated he drinks 1-2 glasses of wine on the weekend.   What exercise is being done to improve Cholesterol?  Patient stated he use to go to the gym and hasn't been back since last summer. He rides a stationary bike a couple times a week at home. Patent stated he does yard work and house work as well.  Adherence Review: Does the patient have >5 day gap between last estimated fill dates? Rosuvastatin showing gaps in fills, patient reports supply on hand. Patient stated he's been taking rosuvastatin 20 mg every night as prescribed.  Follow-Up:  Pharmacist Review   Charlann Lange, RMA Clinical Pharmacist Assistant [phone]  10 minutes spent in review, coordination, and documentation.  Reviewed by: Beverly Milch, PharmD Clinical Pharmacist [facility] [phone]

## 2020-12-11 DIAGNOSIS — Z974 Presence of external hearing-aid: Secondary | ICD-10-CM | POA: Diagnosis not present

## 2020-12-11 DIAGNOSIS — H60331 Swimmer's ear, right ear: Secondary | ICD-10-CM | POA: Diagnosis not present

## 2020-12-11 DIAGNOSIS — H9113 Presbycusis, bilateral: Secondary | ICD-10-CM | POA: Insufficient documentation

## 2020-12-11 DIAGNOSIS — H6121 Impacted cerumen, right ear: Secondary | ICD-10-CM | POA: Diagnosis not present

## 2020-12-20 DIAGNOSIS — H353131 Nonexudative age-related macular degeneration, bilateral, early dry stage: Secondary | ICD-10-CM | POA: Diagnosis not present

## 2020-12-20 DIAGNOSIS — D23122 Other benign neoplasm of skin of left lower eyelid, including canthus: Secondary | ICD-10-CM | POA: Diagnosis not present

## 2020-12-20 DIAGNOSIS — H52203 Unspecified astigmatism, bilateral: Secondary | ICD-10-CM | POA: Diagnosis not present

## 2020-12-20 DIAGNOSIS — H25813 Combined forms of age-related cataract, bilateral: Secondary | ICD-10-CM | POA: Diagnosis not present

## 2020-12-21 ENCOUNTER — Other Ambulatory Visit: Payer: Self-pay

## 2020-12-21 MED ORDER — METOPROLOL TARTRATE 25 MG PO TABS: 12.5000 mg | ORAL_TABLET | Freq: Every day | ORAL | 1 refills | Status: DC

## 2020-12-25 DIAGNOSIS — H938X1 Other specified disorders of right ear: Secondary | ICD-10-CM | POA: Diagnosis not present

## 2020-12-25 DIAGNOSIS — Z8669 Personal history of other diseases of the nervous system and sense organs: Secondary | ICD-10-CM | POA: Diagnosis not present

## 2020-12-28 DIAGNOSIS — L570 Actinic keratosis: Secondary | ICD-10-CM | POA: Diagnosis not present

## 2020-12-28 DIAGNOSIS — L821 Other seborrheic keratosis: Secondary | ICD-10-CM | POA: Diagnosis not present

## 2020-12-28 DIAGNOSIS — L218 Other seborrheic dermatitis: Secondary | ICD-10-CM | POA: Diagnosis not present

## 2021-01-01 ENCOUNTER — Encounter: Payer: Self-pay | Admitting: Internal Medicine

## 2021-01-01 ENCOUNTER — Other Ambulatory Visit: Payer: Self-pay

## 2021-01-01 ENCOUNTER — Ambulatory Visit (INDEPENDENT_AMBULATORY_CARE_PROVIDER_SITE_OTHER): Payer: Medicare HMO | Admitting: Internal Medicine

## 2021-01-01 VITALS — BP 136/78 | HR 47 | Temp 97.9°F | Resp 12 | Ht 73.0 in | Wt 280.6 lb

## 2021-01-01 DIAGNOSIS — I10 Essential (primary) hypertension: Secondary | ICD-10-CM

## 2021-01-01 DIAGNOSIS — I25810 Atherosclerosis of coronary artery bypass graft(s) without angina pectoris: Secondary | ICD-10-CM

## 2021-01-01 DIAGNOSIS — E039 Hypothyroidism, unspecified: Secondary | ICD-10-CM

## 2021-01-01 LAB — BASIC METABOLIC PANEL
BUN: 17 mg/dL (ref 6–23)
CO2: 29 mEq/L (ref 19–32)
Calcium: 9.4 mg/dL (ref 8.4–10.5)
Chloride: 104 mEq/L (ref 96–112)
Creatinine, Ser: 0.85 mg/dL (ref 0.40–1.50)
GFR: 82.31 mL/min (ref >60.00)
Glucose, Bld: 106 mg/dL — ABNORMAL HIGH (ref 70–99)
Potassium: 4 mEq/L (ref 3.5–5.1)
Sodium: 142 mEq/L (ref 135–145)

## 2021-01-01 LAB — TSH: TSH: 0.43 u[IU]/mL (ref 0.35–4.50)

## 2021-01-01 MED ORDER — POTASSIUM CHLORIDE ER 10 MEQ PO TBCR
10.0000 meq | EXTENDED_RELEASE_TABLET | Freq: Every day | ORAL | 1 refills | Status: DC
Start: 2021-01-01 — End: 2021-06-06

## 2021-01-01 NOTE — Progress Notes (Signed)
Subjective:    Patient ID: Steven Munoz, male    DOB: [birth year], 80 y.o.   MRN: [id number]  DOS:  01/01/2021 Type of visit - description: Follow-up Since the last office visit he is doing well and has no major concerns. Good med compliance. He follows low-salt diet, he went back to the gym and is starting to exercise more only in the last couple of weeks.   BP Readings from Last 3 Encounters:  01/01/21 136/78  08/14/20 138/78  07/04/20 133/70     Review of Systems See above   Past Medical History:  Diagnosis Date  . CAD (coronary artery disease) CABG  05/29/09    OPERATIVE PROCEDURES:  Median sternotomy, extracorporeal circulation,   . Colonic polyp 2002, 2005, 2009   [name]  . Dermatophytosis of nail   . Diverticulosis   . Dysmetabolic syndrome   . GERD (gastroesophageal reflux disease)    S/P dilation  2005  . Gout   . Hyperlipidemia   . Hypertension   . Hypothyroidism   . Murmur   . Nephrolithiasis    X 1  . Pneumonia age 89   treated as inpatient  . Sleep apnea    on CPAP; Dr [name]    Past Surgical History:  Procedure Laterality Date  . athroscopy     right knee X2, left knee X1  . COLONOSCOPY  2002 & 2009 &2014   polypectomy; [name]  . CORONARY ARTERY BYPASS GRAFT  2010   Median sternotomy, extracorporeal circulation, coronary bypass graft surgery x3 using a left internal mammary artery graft to left anterior descending coronary artery, with a sequential  saphenous vein graft to posterior descending and posterolateral branches  of the right coronary artery.  Endoscopic vein harvesting from the right  . EYE SURGERY Right    tear duct  . Nail Alvusion Bilateral    Hallux  . NOSE SURGERY  11/20/12   L max antrectomy;septoplasty;turbinate reduction. [name]  . REPLACEMENT TOTAL KNEE     right 2003  . REPLACEMENT TOTAL KNEE     partial Left March 2009  . TENDON RELEASE Right 03/2016   from R hip. @ [name]  . TOTAL HIP ARTHROPLASTY   02/22/2014   right hip; Dr [facility]    Allergies as of 01/01/2021      Reactions   Povidone-iodine    REACTION: rash locally   Ofloxacin    Affected sleep with eye drop formulation   Tape Rash   Cloth adhesive tape-per patient Cloth adhesive tape-per patient Cloth adhesive tape-per patient      Medication List       Accurate as of January 01, 2021 11:59 PM. If you have any questions, ask your nurse or doctor.        aspirin EC 81 MG tablet Take 81 mg by mouth daily.   CENTRUM SILVER PO Take 1 tablet by mouth daily.   PreserVision AREDS 2+Multi Vit Caps Take 1 capsule by mouth 2 (two) times daily.   CoQ10 200 MG Caps Take by mouth.   diphenoxylate-atropine 2.5-0.025 MG tablet Commonly known as: LOMOTIL Take by mouth as needed for diarrhea or loose stools.   famotidine 40 MG tablet Commonly known as: PEPCID Take 40 mg by mouth at bedtime.   hydrochlorothiazide 25 MG tablet Commonly known as: HYDRODIURIL Take 1 tablet (25 mg total) by mouth daily.   levothyroxine 125 MCG tablet Commonly known as: SYNTHROID Take 1 tablet (125  mcg total) by mouth daily before breakfast.   losartan 100 MG tablet Commonly known as: COZAAR Take 1 tablet (100 mg total) by mouth daily.   Magnesium 400 MG Caps Take 400 mg by mouth daily.   metoprolol tartrate 25 MG tablet Commonly known as: LOPRESSOR Take 0.5 tablets (12.5 mg total) by mouth daily.   OMEGA 3 500 PO Take 1,000 mg by mouth daily. Has 1000mg  capsules   potassium chloride 10 MEQ tablet Commonly known as: KLOR-CON Take 1 tablet (10 mEq total) by mouth daily.   rosuvastatin 20 MG tablet Commonly known as: CRESTOR Take 1 tablet (20 mg total) by mouth daily.          Objective:   Physical Exam BP 136/78 (BP Location: Right Arm, Cuff Size: Large)   Pulse (!) 47   Temp 97.9 F (36.6 C) (Oral)   Resp 12   Ht 6\' 1"  (1.854 m)   Wt 280 lb 9.6 oz (127.3 kg)   SpO2 97%   BMI 37.02 kg/m  General:   Well  developed, NAD, BMI noted. HEENT:  Normocephalic . Face symmetric, atraumatic Lungs:  CTA B Normal respiratory effort, no intercostal retractions, no accessory muscle use. Heart: RRR, trace systolic murmur.  Lower extremities: no pretibial edema bilaterally  Skin: Not pale. Not jaundice Neurologic:  alert & oriented X3.  Speech normal, gait appropriate for age and unassisted Psych--  Cognition and judgment appear intact.  Cooperative with normal attention span and concentration.  Behavior appropriate. No anxious or depressed appearing.      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  Ambulatory BPs in the 130s, no change, check a BMP.  Would like to see his ambulatory BPs in the 120s, watch salt intake. Hypothyroidism: Good med compliance, check a TSH CAD: Good med compliance, no symptoms. RTC CPX 06-2021   This visit occurred during the SARS-CoV-2 public health emergency.  Safety protocols were in place, including screening questions prior to the visit, additional usage of staff PPE, and extensive cleaning of exam room while observing appropriate contact time as indicated for disinfecting solutions.

## 2021-01-01 NOTE — Patient Instructions (Addendum)
Continue checking your blood pressures Try to exercise regularly and eat low-salt BP GOAL is between 110/65 and  135/85. If it is consistently higher or lower, let me know   GO TO THE LAB : Get the blood work     Steven Munoz, Steven Munoz back for a physical exam in 6 months

## 2021-01-02 NOTE — Assessment & Plan Note (Signed)
HTN: On HCTZ, losartan, metoprolol, potassium (RF sent).  Ambulatory BPs in the 130s, no change, check a BMP.  Would like to see his ambulatory BPs in the 120s, watch salt intake. Hypothyroidism: Good med compliance, check a TSH CAD: Good med compliance, no symptoms. RTC CPX 06-2021

## 2021-01-23 ENCOUNTER — Other Ambulatory Visit: Payer: Self-pay

## 2021-01-23 ENCOUNTER — Ambulatory Visit (INDEPENDENT_AMBULATORY_CARE_PROVIDER_SITE_OTHER): Payer: Medicare HMO | Admitting: Internal Medicine

## 2021-01-23 ENCOUNTER — Encounter: Payer: Self-pay | Admitting: Internal Medicine

## 2021-01-23 VITALS — BP 144/72 | HR 54 | Temp 98.0°F | Resp 18 | Ht 73.0 in | Wt 286.5 lb

## 2021-01-23 DIAGNOSIS — M5441 Lumbago with sciatica, right side: Secondary | ICD-10-CM | POA: Diagnosis not present

## 2021-01-23 MED ORDER — PREDNISONE 10 MG PO TABS: ORAL_TABLET | ORAL | 0 refills | Status: DC

## 2021-01-23 NOTE — Patient Instructions (Signed)
Start prednisone  Tylenol  500 mg OTC 2 tabs a day every 8 hours as needed for pain  Continue stretching  Call if not gradually better  Call if severe symptoms

## 2021-01-23 NOTE — Progress Notes (Signed)
Subjective:    Patient ID: Steven Munoz, male    DOB: 27-Jan-1941, 80 y.o.   MRN: [id number]  DOS:  01/23/2021 Type of visit - description: Acute Symptoms started approximately 8 to 10 days ago: Pain located at the right buttock, radiates to the right hip and right tight, see graphic. Pain is on and off, changes according to the position. He has been doing some self stretching, he continue going to the gym but had to modify his routine. Last night he felt really uncomfortable, took Tylenol/ibuprofen and felt some but better.    Review of Systems Denies fever chills No rash anywhere in the lower extremities No blood in the stools or in the urine No fall or injury No lower extremity paresthesias  Past Medical History:  Diagnosis Date  . CAD (coronary artery disease) CABG  05/29/09    OPERATIVE PROCEDURES:  Median sternotomy, extracorporeal circulation,   . Colonic polyp 2002, 2005, 2009   [name]  . Dermatophytosis of nail   . Diverticulosis   . Dysmetabolic syndrome   . GERD (gastroesophageal reflux disease)    S/P dilation  2005  . Gout   . Hyperlipidemia   . Hypertension   . Hypothyroidism   . Murmur   . Nephrolithiasis    X 1  . Pneumonia age 45   treated as inpatient  . Sleep apnea    on CPAP; Dr [name]    Past Surgical History:  Procedure Laterality Date  . athroscopy     right knee X2, left knee X1  . COLONOSCOPY  2002 & 2009 &2014   polypectomy; [name]  . CORONARY ARTERY BYPASS GRAFT  2010   Median sternotomy, extracorporeal circulation, coronary bypass graft surgery x3 using a left internal mammary artery graft to left anterior descending coronary artery, with a sequential  saphenous vein graft to posterior descending and posterolateral branches  of the right coronary artery.  Endoscopic vein harvesting from the right  . EYE SURGERY Right    tear duct  . Nail Alvusion Bilateral    Hallux  . NOSE SURGERY  11/20/12   L max  antrectomy;septoplasty;turbinate reduction. [name]  . REPLACEMENT TOTAL KNEE     right 2003  . REPLACEMENT TOTAL KNEE     partial Left March 2009  . TENDON RELEASE Right 03/2016   from R hip. @ [name]  . TOTAL HIP ARTHROPLASTY  02/22/2014   right hip; Dr [facility]    Allergies as of 01/23/2021      Reactions   Povidone-iodine    REACTION: rash locally   Ofloxacin    Affected sleep with eye drop formulation   Tape Rash   Cloth adhesive tape-per patient Cloth adhesive tape-per patient Cloth adhesive tape-per patient      Medication List       Accurate as of January 23, 2021 11:59 PM. If you have any questions, ask your nurse or doctor.        aspirin EC 81 MG tablet Take 81 mg by mouth daily.   CENTRUM SILVER PO Take 1 tablet by mouth daily.   PreserVision AREDS 2+Multi Vit Caps Take 1 capsule by mouth 2 (two) times daily.   CoQ10 200 MG Caps Take by mouth.   diphenoxylate-atropine 2.5-0.025 MG tablet Commonly known as: LOMOTIL Take by mouth as needed for diarrhea or loose stools.   famotidine 40 MG tablet Commonly known as: PEPCID Take 40 mg by mouth at bedtime.  hydrochlorothiazide 25 MG tablet Commonly known as: HYDRODIURIL Take 1 tablet (25 mg total) by mouth daily.   levothyroxine 125 MCG tablet Commonly known as: SYNTHROID Take 1 tablet (125 mcg total) by mouth daily before breakfast.   losartan 100 MG tablet Commonly known as: COZAAR Take 1 tablet (100 mg total) by mouth daily.   Magnesium 400 MG Caps Take 400 mg by mouth daily.   metoprolol tartrate 25 MG tablet Commonly known as: LOPRESSOR Take 0.5 tablets (12.5 mg total) by mouth daily.   OMEGA 3 500 PO Take 1,000 mg by mouth daily. Has 1000mg  capsules   potassium chloride 10 MEQ tablet Commonly known as: KLOR-CON Take 1 tablet (10 mEq total) by mouth daily.   predniSONE 10 MG tablet Commonly known as: DELTASONE 4 tablets x 2 days, 3 tabs x 2 days, 2 tabs x 2 days, 1  tab x 2 days Started by: [name], MD   rosuvastatin 20 MG tablet Commonly known as: CRESTOR Take 1 tablet (20 mg total) by mouth daily.          Objective:   Physical Exam Skin:        BP (!) 144/72 (BP Location: Left Arm, Patient Position: Sitting, Cuff Size: Normal)   Pulse (!) 54   Temp 98 F (36.7 C) (Oral)   Resp 18   Ht 6\' 1"  (1.854 m)   Wt 286 lb 8 oz (130 kg)   SpO2 97%   BMI 37.80 kg/m  General:   Well developed, NAD, BMI noted. HEENT:  Normocephalic . Face symmetric, atraumatic MSK: No TTP at the lumbosacral spine. No TTP at the trochanteric bursa's Hip rotations: Normal the left Slightly restricted on the right (had hip replacement) Skin: Not pale. Not jaundice Neurologic:  alert & oriented X3.  Speech normal, gait appropriate for age and unassisted. Diminished DTRs symmetrically, more noticeable right leg. Straight leg test negative.  Motor symmetric Psych--  Cognition and judgment appear intact.  Cooperative with normal attention span and concentration.  Behavior appropriate. No anxious or depressed appearing.      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  Overall pain has decreased compared to the onset 8 to 10 days ago. Had similar symptoms on the left side, prednisone helped. Plan: Continue stretching, prednisone, Tylenol, try to avoid ibuprofen. Call if not gradually better.  Call if severe symptoms   This visit occurred during the SARS-CoV-2 public health emergency.  Safety protocols were in place, including  screening questions prior to the visit, additional usage of staff PPE, and extensive cleaning of exam room while observing appropriate contact time as indicated for disinfecting solutions.

## 2021-01-24 NOTE — Assessment & Plan Note (Signed)
Low back pain: As described above, probably a lumbar radiculopathy, DDx include hip generated pain.  Overall pain has decreased compared to the onset 8 to 10 days ago. Had similar symptoms on the left side, prednisone helped. Plan: Continue stretching, prednisone, Tylenol, try to avoid ibuprofen. Call if not gradually better.  Call if severe symptoms

## 2021-01-30 ENCOUNTER — Other Ambulatory Visit: Payer: Self-pay

## 2021-01-30 ENCOUNTER — Ambulatory Visit (INDEPENDENT_AMBULATORY_CARE_PROVIDER_SITE_OTHER): Payer: Medicare HMO | Admitting: Internal Medicine

## 2021-01-30 ENCOUNTER — Ambulatory Visit (HOSPITAL_BASED_OUTPATIENT_CLINIC_OR_DEPARTMENT_OTHER)
Admission: RE | Admit: 2021-01-30 | Discharge: 2021-01-30 | Disposition: A | Discharge status: Home / Self Care | Payer: Medicare HMO | Source: Ambulatory Visit | Attending: Internal Medicine | Admitting: Internal Medicine

## 2021-01-30 VITALS — BP 140/72 | HR 59 | Temp 97.1°F | Ht 73.0 in | Wt 280.6 lb

## 2021-01-30 DIAGNOSIS — M109 Gout, unspecified: Secondary | ICD-10-CM | POA: Insufficient documentation

## 2021-01-30 DIAGNOSIS — M19071 Primary osteoarthritis, right ankle and foot: Secondary | ICD-10-CM | POA: Diagnosis not present

## 2021-01-30 DIAGNOSIS — M7731 Calcaneal spur, right foot: Secondary | ICD-10-CM | POA: Diagnosis not present

## 2021-01-30 MED ORDER — COLCHICINE 0.6 MG PO TABS
0.6000 mg | ORAL_TABLET | Freq: Two times a day (BID) | ORAL | 0 refills | Status: DC | PRN
Start: 2021-01-30 — End: 2021-02-21

## 2021-01-30 NOTE — Assessment & Plan Note (Signed)
Right ankle pain: sxs as described above, on chart review, has a h/o gout many years ago and at some point to colchicine.  Uric acid has been normal except in 2011 it was 8.4. On clinical grounds suspect gout. Rec a  x-ray, start colchicine, continue icing, Tylenol. If he is not improving quickly, he definitely will let me know as other dxs will have to be considered (including septic joint). He verbalized understanding.

## 2021-01-30 NOTE — Patient Instructions (Signed)
Please get an x-ray of your right ankle downstairs  Finish prednisone as prescribed  Start taking colchicine twice daily until you are better.  Hold Crestor while you take colchicine  Continue icing the area  You can take Tylenol as needed  Call if not gradually better  Definitely call if you have fever, chills, swelling and redness increases.

## 2021-01-30 NOTE — Progress Notes (Signed)
Subjective:    Patient ID: Steven Munoz, male    DOB: [birth year], 80 y.o.   MRN: [id number]  DOS:  01/30/2021 Type of visit - description: Acute He was seen a week ago with acute back pain, was Rx prednisone. Back pain is better. A day after the visit, he developed acute pain, swelling and redness on the inner aspect of the right ankle.  Currently he is slightly better.  Denies any fever chills No injury that he can recall He did have the diagnosis of gout years ago.  It has been silent for a while.   Review of Systems See above   Past Medical History:  Diagnosis Date  . CAD (coronary artery disease) CABG  05/29/09    OPERATIVE PROCEDURES:  Median sternotomy, extracorporeal circulation,   . Colonic polyp 2002, 2005, 2009   [name]  . Dermatophytosis of nail   . Diverticulosis   . Dysmetabolic syndrome   . GERD (gastroesophageal reflux disease)    S/P dilation  2005  . Gout   . Hyperlipidemia   . Hypertension   . Hypothyroidism   . Murmur   . Nephrolithiasis    X 1  . Pneumonia age 10   treated as inpatient  . Sleep apnea    on CPAP; Dr [name]    Past Surgical History:  Procedure Laterality Date  . athroscopy     right knee X2, left knee X1  . COLONOSCOPY  2002 & 2009 &2014   polypectomy; [name]  . CORONARY ARTERY BYPASS GRAFT  2010   Median sternotomy, extracorporeal circulation, coronary bypass graft surgery x3 using a left internal mammary artery graft to left anterior descending coronary artery, with a sequential  saphenous vein graft to posterior descending and posterolateral branches  of the right coronary artery.  Endoscopic vein harvesting from the right  . EYE SURGERY Right    tear duct  . Nail Alvusion Bilateral    Hallux  . NOSE SURGERY  11/20/12   L max antrectomy;septoplasty;turbinate reduction. [name]  . REPLACEMENT TOTAL KNEE     right 2003  . REPLACEMENT TOTAL KNEE     partial Left March 2009  . TENDON RELEASE Right  03/2016   from R hip. @ [name]  . TOTAL HIP ARTHROPLASTY  02/22/2014   right hip; Dr [facility]    Allergies as of 01/30/2021      Reactions   Povidone-iodine    REACTION: rash locally   Ofloxacin    Affected sleep with eye drop formulation   Tape Rash   Cloth adhesive tape-per patient Cloth adhesive tape-per patient Cloth adhesive tape-per patient      Medication List       Accurate as of January 30, 2021  3:45 PM. If you have any questions, ask your nurse or doctor.        aspirin EC 81 MG tablet Take 81 mg by mouth daily.   CENTRUM SILVER PO Take 1 tablet by mouth daily.   PreserVision AREDS 2+Multi Vit Caps Take 1 capsule by mouth 2 (two) times daily.   CoQ10 200 MG Caps Take by mouth.   diphenoxylate-atropine 2.5-0.025 MG tablet Commonly known as: LOMOTIL Take by mouth as needed for diarrhea or loose stools.   famotidine 40 MG tablet Commonly known as: PEPCID Take 40 mg by mouth at bedtime.   hydrochlorothiazide 25 MG tablet Commonly known as: HYDRODIURIL Take 1 tablet (25 mg total) by mouth daily.  levothyroxine 125 MCG tablet Commonly known as: SYNTHROID Take 1 tablet (125 mcg total) by mouth daily before breakfast.   losartan 100 MG tablet Commonly known as: COZAAR Take 1 tablet (100 mg total) by mouth daily.   Magnesium 400 MG Caps Take 400 mg by mouth daily.   metoprolol tartrate 25 MG tablet Commonly known as: LOPRESSOR Take 0.5 tablets (12.5 mg total) by mouth daily.   OMEGA 3 500 PO Take 1,000 mg by mouth daily. Has 1000mg  capsules   potassium chloride 10 MEQ tablet Commonly known as: KLOR-CON Take 1 tablet (10 mEq total) by mouth daily.   predniSONE 10 MG tablet Commonly known as: DELTASONE 4 tablets x 2 days, 3 tabs x 2 days, 2 tabs x 2 days, 1 tab x 2 days   rosuvastatin 20 MG tablet Commonly known as: CRESTOR Take 1 tablet (20 mg total) by mouth daily.          Objective:   Physical Exam BP 140/72 (BP Location: Left  Arm, Patient Position: Sitting, Cuff Size: Large)   Pulse (!) 59   Temp (!) 97.1 F (36.2 C) (Temporal)   Ht 6\' 1"  (1.854 m)   Wt 280 lb 9.6 oz (127.3 kg)   SpO2 97%   BMI 37.02 kg/m  General:   Well developed, NAD, BMI noted. HEENT:  Normocephalic . Face symmetric, atraumatic Lower extremities: L ankle and foot: Normal, good pulses.  No swelling R ankle and foot: Good pulses, inner aspect of the right ankle is a slightly warm, slightly TTP, small effusion?.  Range of motion of the ankle is essentially symmetric. Neurologic:  alert & oriented X3.  Speech normal, gait appropriate for age and unassisted Psych--  Cognition and judgment appear intact.  Cooperative with normal attention span and concentration.  Behavior appropriate. No anxious or depressed appearing.      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  Uric acid has been normal except in 2011 it was 8.4. On clinical grounds suspect gout. Rec a  x-ray, start colchicine, continue icing, Tylenol. If he is not improving quickly, he definitely will let me know as other dxs will have to be considered (including septic joint). He verbalized understanding.     This visit occurred during the SARS-CoV-2 public health emergency.  Safety protocols were in place, including screening questions prior to the visit, additional usage of staff PPE, and extensive cleaning of exam room while observing appropriate contact time as indicated for disinfecting solutions.

## 2021-02-11 ENCOUNTER — Other Ambulatory Visit: Payer: Self-pay | Admitting: Internal Medicine

## 2021-02-11 DIAGNOSIS — I10 Essential (primary) hypertension: Secondary | ICD-10-CM

## 2021-02-21 ENCOUNTER — Other Ambulatory Visit: Payer: Self-pay | Admitting: Internal Medicine

## 2021-02-22 ENCOUNTER — Ambulatory Visit (INDEPENDENT_AMBULATORY_CARE_PROVIDER_SITE_OTHER): Payer: Medicare HMO | Admitting: Pharmacist

## 2021-02-22 DIAGNOSIS — I25810 Atherosclerosis of coronary artery bypass graft(s) without angina pectoris: Secondary | ICD-10-CM

## 2021-02-22 DIAGNOSIS — E039 Hypothyroidism, unspecified: Secondary | ICD-10-CM

## 2021-02-22 DIAGNOSIS — I10 Essential (primary) hypertension: Secondary | ICD-10-CM

## 2021-02-22 DIAGNOSIS — E785 Hyperlipidemia, unspecified: Secondary | ICD-10-CM

## 2021-02-22 DIAGNOSIS — M109 Gout, unspecified: Secondary | ICD-10-CM

## 2021-02-23 ENCOUNTER — Telehealth: Payer: Medicare HMO

## 2021-02-23 NOTE — Chronic Care Management (AMB) (Signed)
Chronic Care Management Pharmacy Note  02/23/2021 Name:  Steven Munoz MRN:  [id number] DOB:  [birth year]  Subjective: Steven Munoz is an 80 y.o. year old male who is a primary patient of Paz, Alda Berthold, MD.  The CCM team was consulted for assistance with disease management and care coordination needs.    Engaged with patient by telephone for follow up visit in response to provider referral for pharmacy case management and/or care coordination services.  Patient was out of town and initially I was going to call back in 1 week but patient has several questions about gout and medications  Consent to Services:  The patient was given information about Chronic Care Management services, agreed to services, and gave verbal consent prior to initiation of services.  Please see initial visit note for detailed documentation.   Patient Care Team: Colon Branch, MD as PCP - General (Internal Medicine) Josue Hector, MD as PCP - Cardiology (Cardiology) Hallows, Verlee Monte, MD as Consulting Physician (Orthopedic Surgery) Rigoberto Noel, MD as Consulting Physician (Pulmonary Disease) Richmond Campbell, MD as Consulting Physician (Gastroenterology) Shon Hough, MD as Consulting Physician (Ophthalmology) Francee Gentile, [place] as Referring Physician (Chiropractic Medicine) Cherre Robins, PharmD (Pharmacist)  Recent office visits: 01/30/2021 - PCP Larose Kells) - acute ankle pain (right). Suspected gout given history. Recommended x-day; continue to ice ankle. Prescribed colchicine and recommended OTC Tylenol as needed  01/23/2021- PCP (Paz) acute back pain; likely lumbar radiculopathy. Prescribed prednisone and OTC Tylenol. Continue stretching. Avoid Ibuprofen.   Recent consult visits: 12/25/2020 - ENT Minette [place], [facility]) - recheck right ear - small friable aural polyp. Completed course of Cipro and dexamethasone ear drops.  No sign of infection or cholesteatoma. Follow up: as needed. Avoid use of  Q-tips.  Hospital visits: None in previous 6 months  Objective:  Lab Results  Component Value Date   CREATININE 0.85 01/01/2021   CREATININE 0.84 08/21/2020   CREATININE 1.03 07/04/2020    Lab Results  Component Value Date   HGBA1C 5.5 07/04/2020   Last diabetic Eye exam: No results found for: HMDIABEYEEXA  Last diabetic Foot exam: No results found for: HMDIABFOOTEX      Component Value Date/Time   CHOL 132 07/04/2020 0947   TRIG 156 (H) 07/04/2020 0947   HDL 45 07/04/2020 0947   CHOLHDL 2.9 07/04/2020 0947   VLDL 20.8 07/06/2019 0942   LDLCALC 64 07/04/2020 0947    Hepatic Function Latest Ref Rng & Units 07/04/2020 07/06/2019 06/22/2018  Total Protein 6.1 - 8.1 g/dL 6.9 6.9 7.1  Albumin 3.5 - 5.2 g/dL - 4.3 4.5  AST 10 - 35 U/L '23 17 17  ' ALT 9 - 46 U/L '23 16 16  ' Alk Phosphatase 39 - 117 U/L - 63 66  Total Bilirubin 0.2 - 1.2 mg/dL 1.3(H) 1.2 1.2  Bilirubin, Direct 0.0 - 0.3 mg/dL - - -    Lab Results  Component Value Date/Time   TSH 0.43 01/01/2021 09:26 AM   TSH 1.52 10/04/2020 08:46 AM    CBC Latest Ref Rng & Units 07/04/2020 07/06/2019 06/22/2018  WBC 3.8 - 10.8 Thousand/uL 5.5 5.4 8.7  Hemoglobin 13.2 - 17.1 g/dL 15.1 14.6 15.4  Hematocrit 38.5 - 50.0 % 44.7 42.2 45.1  Platelets 140 - 400 Thousand/uL 174 178.0 185.0    No results found for: VD25OH  Clinical ASCVD: Yes  The ASCVD Risk score (Livingston., et al., 2013) failed to calculate for the following reasons:  The 2013 ASCVD risk score is only valid for ages 58 to 61     Social History   Tobacco Use  Smoking Status Former Smoker  . Packs/day: 1.00  . Years: 6.00  . Pack years: 6.00  . Types: Cigarettes  . Quit date: 10/28/1966  . Years since quitting: 54.3  Smokeless Tobacco Never Used  Tobacco Comment   smoked [place], up to < 1 ppd   BP Readings from Last 3 Encounters:  01/30/21 140/72  01/23/21 (!) 144/72  01/01/21 136/78   Pulse Readings from Last 3 Encounters:  01/30/21 (!) 59   01/23/21 (!) 54  01/01/21 (!) 47   Wt Readings from Last 3 Encounters:  01/30/21 280 lb 9.6 oz (127.3 kg)  01/23/21 286 lb 8 oz (130 kg)  01/01/21 280 lb 9.6 oz (127.3 kg)    Assessment: Review of patient past medical history, allergies, medications, health status, including review of consultants reports, laboratory and other test data, was performed as part of comprehensive evaluation and provision of chronic care management services.   SDOH:  (Social Determinants of Health) assessments and interventions performed:  SDOH Interventions   Flowsheet Row Most Recent Value  SDOH Interventions   Financial Strain Interventions Intervention Not Indicated  Transportation Interventions Intervention Not Indicated      CCM Care Plan  Allergies  Allergen Reactions  . Povidone-Iodine     REACTION: rash locally  . Ofloxacin     Affected sleep with eye drop formulation  . Tape Rash    Cloth adhesive tape-per patient Cloth adhesive tape-per patient Cloth adhesive tape-per patient    Medications Reviewed Today    Reviewed by Cherre Robins, PharmD (Pharmacist) on 02/22/21 at [place] List Status: <None>  Medication Order Taking? Sig Documenting Provider Last Dose Status Informant  aspirin EC 81 MG tablet [id number] Yes Take 81 mg by mouth daily. [provider] Taking Active   Coenzyme Q10 (COQ10) 200 MG CAPS [id number] Yes Take by mouth. [provider] Taking Active   colchicine 0.6 MG tablet [id number] Yes TAKE 1 TABLET BY MOUTH 2 TIMES DAILY AS NEEDED Colon Branch, MD Taking Active   diphenoxylate-atropine (LOMOTIL) 2.5-0.025 MG tablet [id number] Yes Take by mouth as needed for diarrhea or loose stools. [provider] Taking Active   famotidine (PEPCID) 40 MG tablet [id number] Yes Take 40 mg by mouth at bedtime. [provider] Taking Active   hydrochlorothiazide (HYDRODIURIL) 25 MG tablet [id number] Yes Take 1 tablet (25 mg total) by mouth daily. Colon Branch, MD Taking Active   levothyroxine (SYNTHROID) 125 MCG tablet [id number] Yes Take 1 tablet (125 mcg total) by mouth daily before breakfast. Colon Branch, MD Taking Active   losartan (COZAAR) 100 MG tablet [id number] Yes Take 1 tablet (100 mg total) by mouth daily. Colon Branch, MD Taking Active   Magnesium 400 MG CAPS [id number] Yes Take 400 mg by mouth daily. [provider] Taking Active Self  metoprolol tartrate (LOPRESSOR) 25 MG tablet [id number] Yes Take 0.5 tablets (12.5 mg total) by mouth daily. Colon Branch, MD Taking Active   Multiple Vitamins-Minerals (CENTRUM SILVER PO) [id number] Yes Take 1 tablet by mouth daily. [provider] Taking Active   Multiple Vitamins-Minerals (PRESERVISION AREDS 2+MULTI VIT) CAPS [id number] Yes Take 1 capsule by mouth 2 (two) times daily.  [provider] Taking Active   Omega-3 Fatty Acids (OMEGA 3 500 PO) [id number] Yes Take 1,000 mg by mouth daily.  Has 1073m capsules [provider] Taking Active   potassium chloride (KLOR-CON) 10 MEQ tablet [id number] Take 1 tablet (10 mEq total) by mouth daily. PColon Branch MD Taking Active   predniSONE (DELTASONE) 10 MG tablet [id number] 4 tablets x 2 days, 3 tabs x 2 days, 2 tabs x 2 days, 1 tab x 2 days  Patient not taking: Reported on 02/22/2021   PColon Branch MD Not Taking Consider Medication Status and Discontinue   rosuvastatin (CRESTOR) 20 MG tablet [id number] Take 1 tablet (20 mg total) by mouth daily. PColon Branch MD  Active            Med Note (Rivanna Anjana Cheek B   Thu Feb 22, 2021  4:39 PM) Holding while taking colchicine          Patient Active Problem List   Diagnosis Date Noted  . Acute gout of right ankle 01/30/2021  . Annual physical exam 06/06/2016  . PCP NOTES >>>>>>>>>>>> 02/13/2016  . CAD (coronary artery disease) 02/13/2016  . Pedal edema 01/25/2016  . Obesity (BMI 30-39.9) 06/02/2014  . CTS (carpal tunnel syndrome) 06/01/2014  . Varicose veins  04/07/2014  . Prolonged P-R interval 03/04/2013  . NEPHROLITHIASIS, HX OF 05/18/2010  . ATRIAL FIBRILLATION 06/20/2009  . CORONARY ARTERY BYPASS GRAFT, HX OF 06/20/2009  . PREMATURE VENTRICULAR CONTRACTIONS 05/09/2009  . MURMUR 05/08/2009  . Essential hypertension 04/10/2009  . DIVERTICULOSIS, COLON 04/10/2009  . G E R D 08/05/2008  . OSA on CPAP 08/05/2008  . DERMATOPHYTOSIS OF NAIL 07/29/2008  . Nocturia 05/30/2008  . Hypothyroidism 02/15/2008  . Elevated lipids 02/15/2008  . History of gout 02/15/2008  . DYSMETABOLIC SYNDROME X 007/37/1062 . History of colonic polyps 01/14/2008    Immunization History  Administered Date(s) Administered  . Fluad Quad(high Dose 65+) 07/06/2019  . Influenza Split 08/21/2011, 08/26/2012  . Influenza Whole 07/29/2008, 08/10/2009, 08/28/2010  . Influenza, High Dose Seasonal PF 08/13/2016, 08/12/2017, 08/26/2018, 07/04/2020  . Influenza,inj,Quad PF,6+ Mos 08/31/2013, 08/16/2014, 07/25/2015  . PFIZER(Purple Top)SARS-COV-2 Vaccination 11/10/2019, 11/30/2019, 08/26/2020  . Pneumococcal Conjugate-13 05/09/2015  . Pneumococcal Polysaccharide-23 05/27/2011  . Td 05/18/2010  . Tdap 07/04/2020  . Zoster 06/06/2016    Conditions to be addressed/monitored: CAD, HTN, HLD and gout; GERD; hypothyroidism; OSA  There are no care plans that you recently modified to display for this patient.     Pre-Diabetes   Patient is currently controlled on the following medications: None - diet and exercise only  Plan -Continue control with diet and exercise  Hyperlipidemia/CAD   LDL goal <70  Patient has failed these meds in past: None noted  Patient is currently controlled on the following medications:  . Rosuvastatin 292mdaily  Plan -Continue current medications   Hypertension   Blood Pressure Goal <130/80  Office blood pressures are  BP Readings from Last 3 Encounters:  01/30/21 140/72  01/23/21 (!) 144/72  01/01/21 136/78   Patient has  failed these meds in the past: None noted  Patient is currently controlled at home on the following medications:   Losartan 1004maily  Hctz 24m6mily  Metoprolol tartrate 24mg43m tab daily  Denies dizziness, lightheadedness, chest pain  Patient checks BP at home about 3 times per week but he was not at home to review BP records  Plan -Continue to check blood pressure 2-3 times per week -Continue current medications  - if continues to have acute gout episodes might need to consider if  hydrochlorothiazide might be worsening.   Hypothyroidism   Patient has failed these meds in past: None noted  Patient is currently uncontrolled on the following medications:  . Levothyroxine 115mg daily .  Plan -Continue current medications  GOUT   Recent acute gout episode Uric acid has been WNL in past Current Treatment:  Colchicine 0.618mup to twice a day as needed Tylenol 32528ms needed for pain  Discussed foods to limit / avoid that can increase uric acid levels (organ meats, red meat, shell fish and alcohol)  States he has occasional glass of red wine.  Patient has questions about preventative therapy - allopurinol. Discussed with him. Patient to discuss further with Dr PazLarose Kells appt 02/26/21  Plan -Continue current medications  - could consider allopurinol once acute episode resolved.   Medication Assistance: None required.  Patient affirms current coverage meets needs.  [facility] [place] [facility] [id number]: [phone]: [phone]/pharmacy [id number]: [phone]: [phone] Up:  Patient agrees to Care Plan and Follow-up.  Plan: Telephone follow up appointment with care management team member scheduled for:  6 months  with clinical pharmacist; pharmacy team will check on patient in 1-2 months  TammCherre RobinsarmD Clinical Pharmacist [facility]

## 2021-02-23 NOTE — Patient Instructions (Signed)
Visit Information  PATIENT GOALS: Goals Addressed            This Visit's Progress   . Chronic Care Management Pharmacy Care Plan   On track    CARE PLAN ENTRY (see longitudinal plan of care for additional care plan information)  Current Barriers:  . Chronic Disease Management support, education, and care coordination needs related to Gout, HTN, HLD, CAD, Hypothyroidism, GERD   Hypertension BP Readings from Last 3 Encounters:  01/30/21 140/72  01/23/21 (!) 144/72  01/01/21 136/78   . Pharmacist Clinical Goal(s): o Over the next 180 days, patient will work with PharmD and providers to maintain BP goal <130/80 . Current regimen:   Losartan 100mg  daily  Hctz 25mg  daily  Metoprolol tartrate 25mg  1/2 tab daily . Interventions: o Discussed BP goal . Patient self care activities - Over the next 180 days, patient will: o Maintain hypertension medication regimen.   Hyperlipidemia Lab Results  Component Value Date/Time   LDLCALC 64 07/04/2020 09:47 AM   . Pharmacist Clinical Goal(s): o Over the next 180 days, patient will work with PharmD and providers to maintain LDL goal < 70 . Current regimen:  o Rosuvastatin 20mg  daily . Interventions: o Discussed importance of adherence . Patient self care activities - Over the next 180 days, patient will: o Maintain cholesterol medication regimen.   GOut:  . Pharmacist Clinical Goal(s): o Over the next 180 days, patient will work with PharmD and providers to decreased gout exacerbations . Current regimen:  o Colchicine 0.6mg  up to twice a day as needed for gout o Tyelnol 325mg  - take 1 or 2 tablets up to every 8 hours as needed for pain . Interventions: o Discussed foods that can increase uric acid / gout o Consider preventative after acute episode controlled.  . Patient self care activities - Over the next 180 days, patient will: o Avoid foods that can increase uric acid.   Hypothyroidism . Pharmacist Clinical  Goal(s) o Over the next 90 days, patient will work with PharmD and providers to achieve TSH within normal limits . Current regimen:  o Levothyroxine 184mcg daily . Patient self care activities - Over the next 90 days, patient will: o Maintain hypothyroidism medication regimen  Pre-Diabetes Lab Results  Component Value Date/Time   HGBA1C 5.5 07/04/2020 09:47 AM   HGBA1C 5.7 07/06/2019 09:42 AM   . Pharmacist Clinical Goal(s): o Over the next 180 days, patient will work with PharmD and providers to maintain A1c goal  <5.7% . Current regimen:  o Diet and exercise management   . Patient self care activities - Over the next 180 days, patient will: o Maintain a1c <5.7%  Medication management . Pharmacist Clinical Goal(s): o Over the next 180 days, patient will work with PharmD and providers to maintain optimal medication adherence . [facility] . Interventions o Comprehensive medication review performed. o Continue current medication management strategy . Patient self care activities - Over the next 180 days, patient will: o Focus on medication adherence by filling and taking medications appropriately  o Take medications as prescribed o Report any questions or concerns to PharmD and/or provider(s)  Please see past updates related to this goal by clicking on the "Past Updates" button in the selected goal        The patient verbalized understanding of instructions, educational materials, and care plan provided today and declined offer to receive copy of patient instructions, educational materials, and care plan.  Telephone follow up appointment with care management team member scheduled for: 3 months  Cherre Robins, PharmD Clinical Pharmacist [facility]

## 2021-02-26 ENCOUNTER — Other Ambulatory Visit: Payer: Self-pay

## 2021-02-26 ENCOUNTER — Ambulatory Visit (INDEPENDENT_AMBULATORY_CARE_PROVIDER_SITE_OTHER): Payer: Medicare HMO | Admitting: Internal Medicine

## 2021-02-26 ENCOUNTER — Encounter: Payer: Self-pay | Admitting: Internal Medicine

## 2021-02-26 VITALS — BP 146/70 | HR 55 | Temp 97.8°F | Resp 18 | Ht 73.0 in | Wt 281.2 lb

## 2021-02-26 DIAGNOSIS — R739 Hyperglycemia, unspecified: Secondary | ICD-10-CM

## 2021-02-26 DIAGNOSIS — M109 Gout, unspecified: Secondary | ICD-10-CM

## 2021-02-26 DIAGNOSIS — G629 Polyneuropathy, unspecified: Secondary | ICD-10-CM

## 2021-02-26 DIAGNOSIS — R202 Paresthesia of skin: Secondary | ICD-10-CM

## 2021-02-26 MED ORDER — ALLOPURINOL 100 MG PO TABS: 100.0000 mg | ORAL_TABLET | Freq: Every day | ORAL | 3 refills | Status: DC

## 2021-02-26 NOTE — Patient Instructions (Addendum)
For gout: Start allopurinol 100 mg: Take 1 tablet every day  For the next 10 days, take colchicine twice daily, after that take it only as needed  If you have another gout flareup, start taking colchicine on let me know.  Watch for side effects from allopurinol particularly a rash    We are referring you to a neurologist about the right foot numbness    GO TO THE LAB : Get the blood work     [place], Stark back for checkup in 3 months

## 2021-02-26 NOTE — Progress Notes (Signed)
Subjective:    Patient ID: Steven Munoz, male    DOB: September 08, 1941, 80 y.o.   MRN: [id number]  DOS:  02/26/2021 Type of visit - description: Acute See last visit, was diagnosed with gout, prescribed prednisone and colchicine twice daily.  He took colchicine for 2 weeks and then stop as recommended. Then swelling and pain came back around the right ankle. Few days ago he decided to restart colchicine and is already improving.  Throughout this time, he has not noticed any fever, chills or redness around the ankle.  Also has a different issue: On and off bilateral plantar numbness, here lately the numbness is much more noticeable at the right plantar area, external side.  The numbness there is persistent, not worse during the daytime.  He had a recent right-sided back pain but that is largely resolved.   Review of Systems See above   Past Medical History:  Diagnosis Date  . CAD (coronary artery disease) CABG  05/29/09    OPERATIVE PROCEDURES:  Median sternotomy, extracorporeal circulation,   . Colonic polyp 2002, 2005, 2009   [name]  . Dermatophytosis of nail   . Diverticulosis   . Dysmetabolic syndrome   . GERD (gastroesophageal reflux disease)    S/P dilation  2005  . Gout   . Hyperlipidemia   . Hypertension   . Hypothyroidism   . Murmur   . Nephrolithiasis    X 1  . Pneumonia age 46   treated as inpatient  . Sleep apnea    on CPAP; Dr [name]    Past Surgical History:  Procedure Laterality Date  . athroscopy     right knee X2, left knee X1  . COLONOSCOPY  2002 & 2009 &2014   polypectomy; [name]  . CORONARY ARTERY BYPASS GRAFT  2010   Median sternotomy, extracorporeal circulation, coronary bypass graft surgery x3 using a left internal mammary artery graft to left anterior descending coronary artery, with a sequential  saphenous vein graft to posterior descending and posterolateral branches  of the right coronary artery.  Endoscopic vein harvesting from the right   . EYE SURGERY Right    tear duct  . Nail Alvusion Bilateral    Hallux  . NOSE SURGERY  11/20/12   L max antrectomy;septoplasty;turbinate reduction. [name]  . REPLACEMENT TOTAL KNEE     right 2003  . REPLACEMENT TOTAL KNEE     partial Left March 2009  . TENDON RELEASE Right 03/2016   from R hip. @ [name]  . TOTAL HIP ARTHROPLASTY  02/22/2014   right hip; Dr [facility]    Allergies as of 02/26/2021      Reactions   Povidone-iodine    REACTION: rash locally   Ofloxacin    Affected sleep with eye drop formulation   Tape Rash   Cloth adhesive tape-per patient Cloth adhesive tape-per patient Cloth adhesive tape-per patient      Medication List       Accurate as of Feb 26, 2021 11:59 PM. If you have any questions, ask your nurse or doctor.        allopurinol 100 MG tablet Commonly known as: ZYLOPRIM Take 1 tablet (100 mg total) by mouth daily. Started by: [name], MD   aspirin EC 81 MG tablet Take 81 mg by mouth daily.   CENTRUM SILVER PO Take 1 tablet by mouth daily.   PreserVision AREDS 2+Multi Vit Caps Take 1 capsule by mouth 2 (two) times daily.  colchicine 0.6 MG tablet TAKE 1 TABLET BY MOUTH 2 TIMES DAILY AS NEEDED   CoQ10 200 MG Caps Take by mouth.   diphenoxylate-atropine 2.5-0.025 MG tablet Commonly known as: LOMOTIL Take by mouth as needed for diarrhea or loose stools.   famotidine 40 MG tablet Commonly known as: PEPCID Take 40 mg by mouth at bedtime.   hydrochlorothiazide 25 MG tablet Commonly known as: HYDRODIURIL Take 1 tablet (25 mg total) by mouth daily.   levothyroxine 125 MCG tablet Commonly known as: SYNTHROID Take 1 tablet (125 mcg total) by mouth daily before breakfast.   losartan 100 MG tablet Commonly known as: COZAAR Take 1 tablet (100 mg total) by mouth daily.   Magnesium 400 MG Caps Take 400 mg by mouth daily.   metoprolol tartrate 25 MG tablet Commonly known as: LOPRESSOR Take 0.5 tablets (12.5 mg  total) by mouth daily.   OMEGA 3 500 PO Take 1,000 mg by mouth daily. Has 1000mg  capsules   potassium chloride 10 MEQ tablet Commonly known as: KLOR-CON Take 1 tablet (10 mEq total) by mouth daily.   predniSONE 10 MG tablet Commonly known as: DELTASONE 4 tablets x 2 days, 3 tabs x 2 days, 2 tabs x 2 days, 1 tab x 2 days   rosuvastatin 20 MG tablet Commonly known as: CRESTOR Take 1 tablet (20 mg total) by mouth daily.          Objective:   Physical Exam BP (!) 146/70 (BP Location: Left Arm, Patient Position: Sitting, Cuff Size: Normal)   Pulse (!) 55   Temp 97.8 F (36.6 C) (Oral)   Resp 18   Ht 6\' 1"  (1.854 m)   Wt 281 lb 4 oz (127.6 kg)   SpO2 97%   BMI 37.11 kg/m  General:   Well developed, NAD, BMI noted. HEENT:  Normocephalic . Face symmetric, atraumatic MSK: No TTP at the lumbar spine. Lower extremities: Trace edema pretibially Good pedal pulses bilaterally Minimal TTP and swelling around the right ankle.  There is not red or warm. Skin: Not pale. Not jaundice Neurologic:  alert & oriented X3.  Speech normal, gait appropriate for age and unassisted. DTR symmetric, ankle jerks decreased bilaterally  psych--  Cognition and judgment appear intact.  Cooperative with normal attention span and concentration.  Behavior appropriate. No anxious or depressed appearing.      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 Plan: CMP, CBC, uric acid Allopurinol Colchicine twice daily for the  next 10 days to prevent episode. Call if he has an acute episode. Paresthesias: Located at the plantar areas and at the lateral aspect of the R foot.  This has been on and off for a while, interestingly he had an episode of back pain few months ago. Suspect could be a radiculopathy, patient desires further evaluation. Check J4N, W29, folic acid.  Neuro referral. RTC 3 months   This visit occurred during the SARS-CoV-2 public health emergency.  Safety protocols were  in place, including screening questions prior to the visit, additional usage of staff PPE, and extensive cleaning of exam room while observing appropriate contact time as indicated for disinfecting solutions.

## 2021-02-27 LAB — COMPREHENSIVE METABOLIC PANEL
ALT: 30 U/L (ref 0–53)
AST: 25 U/L (ref 0–37)
Albumin: 4.5 g/dL (ref 3.5–5.2)
Alkaline Phosphatase: 74 U/L (ref 39–117)
BUN: 17 mg/dL (ref 6–23)
CO2: 29 mEq/L (ref 19–32)
Calcium: 10 mg/dL (ref 8.4–10.5)
Chloride: 101 mEq/L (ref 96–112)
Creatinine, Ser: 0.89 mg/dL (ref 0.40–1.50)
GFR: 81.08 mL/min (ref >60.00)
Glucose, Bld: 95 mg/dL (ref 70–99)
Potassium: 3.7 mEq/L (ref 3.5–5.1)
Sodium: 139 mEq/L (ref 135–145)
Total Bilirubin: 0.7 mg/dL (ref 0.2–1.2)
Total Protein: 7 g/dL (ref 6.0–8.3)

## 2021-02-27 LAB — B12 AND FOLATE PANEL
Folate: >23.6 ng/mL (ref >5.9)
Vitamin B-12: 357 pg/mL (ref 211–911)

## 2021-02-27 LAB — CBC WITH DIFFERENTIAL/PLATELET
Basophils Absolute: 0.1 10*3/uL (ref 0.0–0.1)
Basophils Relative: 1.4 % (ref 0.0–3.0)
Eosinophils Absolute: 0.3 10*3/uL (ref 0.0–0.7)
Eosinophils Relative: 4.2 % (ref 0.0–5.0)
HCT: 43.1 % (ref 39.0–52.0)
Hemoglobin: 14.8 g/dL (ref 13.0–17.0)
Lymphocytes Relative: 35.8 % (ref 12.0–46.0)
Lymphs Abs: 2.3 10*3/uL (ref 0.7–4.0)
MCHC: 34.3 g/dL (ref 30.0–36.0)
MCV: 94.2 fl (ref 78.0–100.0)
Monocytes Absolute: 0.6 10*3/uL (ref 0.1–1.0)
Monocytes Relative: 8.8 % (ref 3.0–12.0)
Neutro Abs: 3.2 10*3/uL (ref 1.4–7.7)
Neutrophils Relative %: 49.8 % (ref 43.0–77.0)
Platelets: 181 10*3/uL (ref 150.0–400.0)
RBC: 4.57 Mil/uL (ref 4.22–5.81)
RDW: 14 % (ref 11.5–15.5)
WBC: 6.4 10*3/uL (ref 4.0–10.5)

## 2021-02-27 LAB — HEMOGLOBIN A1C: Hgb A1c MFr Bld: 5.7 % (ref 4.6–6.5)

## 2021-02-27 LAB — URIC ACID: Uric Acid, Serum: 6.1 mg/dL (ref 4.0–7.8)

## 2021-02-27 NOTE — Assessment & Plan Note (Signed)
Gout: See last visit, had R ankle pain felt to be gout, x-ray was negative, improved after prednisone and colchicine but symptoms resurface. Plan: CMP, CBC, uric acid Allopurinol Colchicine twice daily for the next 10 days to prevent episode. Call if he has an acute episode. Paresthesias: Located at the plantar areas and at the lateral aspect of the R foot.  This has been on and off for a while, interestingly he had an episode of back pain few months ago. Suspect could be a radiculopathy, patient desires further evaluation. Check E9F, Y10, folic acid.  Neuro referral. RTC 3 months

## 2021-02-28 ENCOUNTER — Emergency Department (HOSPITAL_BASED_OUTPATIENT_CLINIC_OR_DEPARTMENT_OTHER)
Admission: EM | Admit: 2021-02-28 | Discharge: 2021-02-28 | Disposition: A | Discharge status: Home / Self Care | Payer: Medicare HMO | Attending: Emergency Medicine | Admitting: Emergency Medicine

## 2021-02-28 ENCOUNTER — Emergency Department (HOSPITAL_BASED_OUTPATIENT_CLINIC_OR_DEPARTMENT_OTHER): Payer: Medicare HMO

## 2021-02-28 ENCOUNTER — Other Ambulatory Visit: Payer: Self-pay

## 2021-02-28 ENCOUNTER — Encounter (HOSPITAL_BASED_OUTPATIENT_CLINIC_OR_DEPARTMENT_OTHER): Payer: Self-pay | Admitting: *Deleted

## 2021-02-28 DIAGNOSIS — Z951 Presence of aortocoronary bypass graft: Secondary | ICD-10-CM | POA: Insufficient documentation

## 2021-02-28 DIAGNOSIS — S59901A Unspecified injury of right elbow, initial encounter: Secondary | ICD-10-CM | POA: Diagnosis not present

## 2021-02-28 DIAGNOSIS — Z87891 Personal history of nicotine dependence: Secondary | ICD-10-CM | POA: Diagnosis not present

## 2021-02-28 DIAGNOSIS — I2510 Atherosclerotic heart disease of native coronary artery without angina pectoris: Secondary | ICD-10-CM | POA: Insufficient documentation

## 2021-02-28 DIAGNOSIS — S0003XA Contusion of scalp, initial encounter: Secondary | ICD-10-CM | POA: Diagnosis not present

## 2021-02-28 DIAGNOSIS — S5001XA Contusion of right elbow, initial encounter: Secondary | ICD-10-CM | POA: Diagnosis not present

## 2021-02-28 DIAGNOSIS — Z79899 Other long term (current) drug therapy: Secondary | ICD-10-CM | POA: Diagnosis not present

## 2021-02-28 DIAGNOSIS — Z96653 Presence of artificial knee joint, bilateral: Secondary | ICD-10-CM | POA: Diagnosis not present

## 2021-02-28 DIAGNOSIS — W01198A Fall on same level from slipping, tripping and stumbling with subsequent striking against other object, initial encounter: Secondary | ICD-10-CM | POA: Insufficient documentation

## 2021-02-28 DIAGNOSIS — I119 Hypertensive heart disease without heart failure: Secondary | ICD-10-CM | POA: Diagnosis not present

## 2021-02-28 DIAGNOSIS — Z96641 Presence of right artificial hip joint: Secondary | ICD-10-CM | POA: Diagnosis not present

## 2021-02-28 DIAGNOSIS — E039 Hypothyroidism, unspecified: Secondary | ICD-10-CM | POA: Diagnosis not present

## 2021-02-28 DIAGNOSIS — Z7982 Long term (current) use of aspirin: Secondary | ICD-10-CM | POA: Insufficient documentation

## 2021-02-28 DIAGNOSIS — M7989 Other specified soft tissue disorders: Secondary | ICD-10-CM | POA: Diagnosis not present

## 2021-02-28 DIAGNOSIS — S0990XA Unspecified injury of head, initial encounter: Secondary | ICD-10-CM | POA: Diagnosis not present

## 2021-02-28 DIAGNOSIS — R519 Headache, unspecified: Secondary | ICD-10-CM | POA: Diagnosis not present

## 2021-02-28 DIAGNOSIS — M542 Cervicalgia: Secondary | ICD-10-CM | POA: Diagnosis not present

## 2021-02-28 NOTE — Discharge Instructions (Addendum)

## 2021-02-28 NOTE — ED Provider Notes (Signed)
[facility] EMERGENCY DEPARTMENT Provider Note   CSN: [id number] Arrival date & time: 02/28/21  1353     History Chief Complaint  Patient presents with  . Head Injury    Steven Munoz is a 80 y.o. male who presents emergency department with a chief complaint of fall.  Patient was standing on uneven surface in his garage working on his car when he lost his footing stepping backward, try to correct, fell forward onto the right side hitting his elbow and head on the concrete floor.  He did not lose consciousness.  He denies headaches, change in vision, nausea or vomiting.  His wife who is at bedside states that he has been in his normal state of mentation without patient complains of a large hematoma to the right parieto-occipital region as well as a painful, swollen elbow.  He denies any numbness or tingling of the upper extremities, weakness. He is up-to-date on his tetanus vaccination. 1. HPI     Past Medical History:  Diagnosis Date  . CAD (coronary artery disease) CABG  05/29/09    OPERATIVE PROCEDURES:  Median sternotomy, extracorporeal circulation,   . Colonic polyp 2002, 2005, 2009   [name]  . Dermatophytosis of nail   . Diverticulosis   . Dysmetabolic syndrome   . GERD (gastroesophageal reflux disease)    S/P dilation  2005  . Gout   . Hyperlipidemia   . Hypertension   . Hypothyroidism   . Murmur   . Nephrolithiasis    X 1  . Pneumonia age 37   treated as inpatient  . Sleep apnea    on CPAP; Dr [name]    Patient Active Problem List   Diagnosis Date Noted  . Acute gout of right ankle 01/30/2021  . Annual physical exam 06/06/2016  . PCP NOTES >>>>>>>>>>>> 02/13/2016  . CAD (coronary artery disease) 02/13/2016  . Pedal edema 01/25/2016  . Obesity (BMI 30-39.9) 06/02/2014  . CTS (carpal tunnel syndrome) 06/01/2014  . Varicose veins 04/07/2014  . Prolonged P-R interval 03/04/2013  . NEPHROLITHIASIS, HX OF 05/18/2010  . ATRIAL FIBRILLATION 06/20/2009   . CORONARY ARTERY BYPASS GRAFT, HX OF 06/20/2009  . PREMATURE VENTRICULAR CONTRACTIONS 05/09/2009  . MURMUR 05/08/2009  . Essential hypertension 04/10/2009  . DIVERTICULOSIS, COLON 04/10/2009  . G E R D 08/05/2008  . OSA on CPAP 08/05/2008  . DERMATOPHYTOSIS OF NAIL 07/29/2008  . Nocturia 05/30/2008  . Hypothyroidism 02/15/2008  . Elevated lipids 02/15/2008  . History of gout 02/15/2008  . DYSMETABOLIC SYNDROME X 89/38/1017  . History of colonic polyps 01/14/2008    Past Surgical History:  Procedure Laterality Date  . athroscopy     right knee X2, left knee X1  . COLONOSCOPY  2002 & 2009 &2014   polypectomy; [name]  . CORONARY ARTERY BYPASS GRAFT  2010   Median sternotomy, extracorporeal circulation, coronary bypass graft surgery x3 using a left internal mammary artery graft to left anterior descending coronary artery, with a sequential  saphenous vein graft to posterior descending and posterolateral branches  of the right coronary artery.  Endoscopic vein harvesting from the right  . EYE SURGERY Right    tear duct  . Nail Alvusion Bilateral    Hallux  . NOSE SURGERY  11/20/12   L max antrectomy;septoplasty;turbinate reduction. [name]  . REPLACEMENT TOTAL KNEE     right 2003  . REPLACEMENT TOTAL KNEE     partial Left March 2009  . TENDON  RELEASE Right 03/2016   from R hip. @ [name]  . TOTAL HIP ARTHROPLASTY  02/22/2014   right hip; Dr Katherine Shaw [facility]       Family History  Problem Relation Age of Onset  . Hypertension Mother   . Transient ischemic attack Mother   . Lung cancer Mother        liver cancer, smoker, TIA  . Heart attack Father 19  . Heart attack Brother 76       smoker  . Diabetes Neg Hx   . Colon cancer Neg Hx   . Prostate cancer Neg Hx     Social History   Tobacco Use  . Smoking status: Former Smoker    Packs/day: 1.00    Years: 6.00    Pack years: 6.00    Types: Cigarettes    Quit date: 10/28/1966    Years since quitting:  54.3  . Smokeless tobacco: Never Used  . Tobacco comment: smoked [place], up to < 1 ppd  Substance Use Topics  . Alcohol use: Yes    Alcohol/week: 3.0 standard drinks    Types: 3 Glasses of wine per week    Comment: socially  . Drug use: No    Home Medications Prior to Admission medications   Medication Sig Start Date End Date Taking? Authorizing Provider  allopurinol (ZYLOPRIM) 100 MG tablet Take 1 tablet (100 mg total) by mouth daily. 02/26/21   Colon Branch, MD  aspirin EC 81 MG tablet Take 81 mg by mouth daily.    [provider]  Coenzyme Q10 (COQ10) 200 MG CAPS Take by mouth.    [provider]  colchicine 0.6 MG tablet TAKE 1 TABLET BY MOUTH 2 TIMES DAILY AS NEEDED 02/21/21   Colon Branch, MD  diphenoxylate-atropine (LOMOTIL) 2.5-0.025 MG tablet Take by mouth as needed for diarrhea or loose stools.    [provider]  famotidine (PEPCID) 40 MG tablet Take 40 mg by mouth at bedtime.    [provider]  hydrochlorothiazide (HYDRODIURIL) 25 MG tablet Take 1 tablet (25 mg total) by mouth daily. 02/12/21   Colon Branch, MD  levothyroxine (SYNTHROID) 125 MCG tablet Take 1 tablet (125 mcg total) by mouth daily before breakfast. 11/17/20   Colon Branch, MD  losartan (COZAAR) 100 MG tablet Take 1 tablet (100 mg total) by mouth daily. 02/12/21   Colon Branch, MD  Magnesium 400 MG CAPS Take 400 mg by mouth daily.    [provider]  metoprolol tartrate (LOPRESSOR) 25 MG tablet Take 0.5 tablets (12.5 mg total) by mouth daily. 12/21/20   Colon Branch, MD  Multiple Vitamins-Minerals (CENTRUM SILVER PO) Take 1 tablet by mouth daily.    [provider]  Multiple Vitamins-Minerals (PRESERVISION AREDS 2+MULTI VIT) CAPS Take 1 capsule by mouth 2 (two) times daily.     [provider]  Omega-3 Fatty Acids (OMEGA 3 500 PO) Take 1,000 mg by mouth daily. Has 1000mg  capsules    [provider]  potassium chloride (KLOR-CON) 10 MEQ tablet Take 1  tablet (10 mEq total) by mouth daily. 01/01/21   Colon Branch, MD  predniSONE (DELTASONE) 10 MG tablet 4 tablets x 2 days, 3 tabs x 2 days, 2 tabs x 2 days, 1 tab x 2 days Patient not taking: No sig reported 01/23/21   Colon Branch, MD  rosuvastatin (CRESTOR) 20 MG tablet Take 1 tablet (20 mg total) by mouth daily. 10/13/20  Colon Branch, MD    Allergies    Povidone-iodine, Ofloxacin, and Tape  Review of Systems   Review of Systems Ten systems reviewed and are negative for acute change, except as noted in the HPI.   Physical Exam Updated Vital Signs BP (!) 160/67 (BP Location: Right Arm)   Pulse 60   Temp 98 F (36.7 C) (Oral)   Resp 18   Ht 6' 0.5" (1.842 m)   Wt 127 kg   SpO2 97%   BMI 37.45 kg/m   Physical Exam Physical Exam  Nursing note and vitals reviewed. Constitutional: He appears well-developed and well-nourished. No distress.  HENT:  Head: Normocephalic.  Large hematoma to the right parietal occipital region, no apparent bony tenderness or step-off..  Eyes: Conjunctivae normal are normal. No scleral icterus.  Neck: Normal range of motion. Neck supple.  Cardiovascular: Normal rate, regular rhythm and normal heart sounds.   Pulmonary/Chest: Effort normal and breath sounds normal. No respiratory distress.  Abdominal: Soft. There is no tenderness.  Musculoskeletal: Right elbow with small abrasion, bruising, swelling over the olecranon and bogginess consistent with swelling of the bursa.  Range of motion is somewhat diminished due to pain with flexion extension, no obvious bony tenderness.  Neurological: Speech is clear and goal oriented, follows commands Major Cranial nerves without deficit, no facial droop Normal strength in upper and lower extremities bilaterally including dorsiflexion and plantar flexion, strong and equal grip strength Sensation normal to light and sharp touch Moves extremities without ataxia, coordination intact Normal finger to nose and rapid  alternating movements Neg romberg, no pronator drift Normal gait Normal heel-shin and balance Skin: Skin is warm and dry. He is not diaphoretic.  Psychiatric: His behavior is normal.    ED Results / Procedures / Treatments   Labs (all labs ordered are listed, but only abnormal results are displayed) Labs Reviewed - No data to display  EKG None  Radiology No results found.  Procedures Procedures   Medications Ordered in ED Medications - No data to display  ED Course  I have reviewed the triage vital signs and the nursing notes.  Pertinent labs & imaging results that were available during my care of the patient were reviewed by me and considered in my medical decision making (see chart for details).    MDM Rules/Calculators/A&P                          Patient here with fall. I ordered,interpreted, and reviewed images including CT Head, CT C-spine, and right elbow x-ray which showed no significant abnormalities.  There is some swelling of the right elbow likely traumatic olecranon swelling.  Patient advised on return precautions for head injury, advised to sleep with head of the bed elevated, ice, Motrin or Tylenol for pain, return for worsening symptoms to the ER and otherwise follow-up with PCP.  Appears otherwise appropriate for discharge at this time. Final Clinical Impression(s) / ED Diagnoses Final diagnoses:  None    Rx / DC Orders ED Discharge Orders    None       Margarita Mail, PA-C 02/28/21 [place], [place], DO 02/28/21 1606

## 2021-02-28 NOTE — ED Notes (Signed)
Patient transported to CT/Xray. 

## 2021-02-28 NOTE — ED Triage Notes (Signed)
C/o fall from standing hitting head on concrete x 10 ins ago , denies LOC , also c/o right elbow injury with swelling

## 2021-02-28 NOTE — ED Notes (Signed)
Patient transported to X-ray 

## 2021-03-08 ENCOUNTER — Encounter: Payer: Self-pay | Admitting: Neurology

## 2021-03-08 ENCOUNTER — Ambulatory Visit: Payer: Medicare HMO | Admitting: Neurology

## 2021-03-08 VITALS — BP 148/77 | HR 60 | Ht 72.6 in | Wt 283.6 lb

## 2021-03-08 DIAGNOSIS — M5431 Sciatica, right side: Secondary | ICD-10-CM

## 2021-03-08 NOTE — Progress Notes (Signed)
Reason for visit: Right leg pain  Referring physician: Dr. [name] is a 80 y.o. male  History of present illness:  Steven Munoz is an 80 year old right-handed white male with a history of onset of back pain and right leg discomfort that began around 23 January 2021.  The patient noted pain in the right buttocks going down the leg and anterior thigh to the knee but not below the knee.  The patient was seen by his primary care physician who started prednisone.  Within 24 hours after starting prednisone, the patient developed swelling and redness and pain around the ankle of the right foot.  He was thought to have gout and was treated for a gouty attack.  The swelling has improved but the patient has been left with some numbness that affects the lateral aspect of the right foot and the sole of the foot.  The patient indicates that he still has some discomfort around the ankle.  He no longer has the upper thigh or buttocks pain.  He denies any weakness of the legs.  He may have some slight numbness of the left foot at times.  He has not noted any change in balance, he has not had any falls.  He is concerned about his driving as he cannot feel his foot very well.  The patient is sent to this office for further evaluation.  Past Medical History:  Diagnosis Date  . CAD (coronary artery disease) CABG  05/29/09    OPERATIVE PROCEDURES:  Median sternotomy, extracorporeal circulation,   . Colonic polyp 2002, 2005, 2009   [name]  . Dermatophytosis of nail   . Diverticulosis   . Dysmetabolic syndrome   . GERD (gastroesophageal reflux disease)    S/P dilation  2005  . Gout   . Hyperlipidemia   . Hypertension   . Hypothyroidism   . Murmur   . Nephrolithiasis    X 1  . Pneumonia age 42   treated as inpatient  . Sleep apnea    on CPAP; Dr [name]    Past Surgical History:  Procedure Laterality Date  . athroscopy     right knee X2, left knee X1  . COLONOSCOPY  2002 & 2009 &2014    polypectomy; [name]  . CORONARY ARTERY BYPASS GRAFT  2010   Median sternotomy, extracorporeal circulation, coronary bypass graft surgery x3 using a left internal mammary artery graft to left anterior descending coronary artery, with a sequential  saphenous vein graft to posterior descending and posterolateral branches  of the right coronary artery.  Endoscopic vein harvesting from the right  . EYE SURGERY Right    tear duct  . Nail Alvusion Bilateral    Hallux  . NOSE SURGERY  11/20/12   L max antrectomy;septoplasty;turbinate reduction. [name]  . REPLACEMENT TOTAL KNEE     right 2003  . REPLACEMENT TOTAL KNEE     partial Left March 2009  . TENDON RELEASE Right 03/2016   from R hip. @ [name]  . TOTAL HIP ARTHROPLASTY  02/22/2014   right hip; [name]    Family History  Problem Relation Age of Onset  . Hypertension Mother   . Transient ischemic attack Mother   . Lung cancer Mother        liver cancer, smoker, TIA  . Heart attack Father 33  . Heart attack Brother 25       smoker  . Diabetes Neg Hx   .  Colon cancer Neg Hx   . Prostate cancer Neg Hx     Social history:  reports that he quit smoking about 54 years ago. His smoking use included cigarettes. He has a 6.00 pack-year smoking history. He has never used smokeless tobacco. He reports current alcohol use of about 3.0 standard drinks of alcohol per week. He reports that he does not use drugs.  Medications:  Prior to Admission medications   Medication Sig Start Date End Date Taking? Authorizing Provider  allopurinol (ZYLOPRIM) 100 MG tablet Take 1 tablet (100 mg total) by mouth daily. 02/26/21  Yes Colon Branch, MD  aspirin EC 81 MG tablet Take 81 mg by mouth daily.   Yes [provider]  Coenzyme Q10 (COQ10) 200 MG CAPS Take by mouth.   Yes [provider]  colchicine 0.6 MG tablet TAKE 1 TABLET BY MOUTH 2 TIMES DAILY AS NEEDED 02/21/21  Yes Paz, Alda Berthold, MD  diphenoxylate-atropine  (LOMOTIL) 2.5-0.025 MG tablet Take by mouth as needed for diarrhea or loose stools.   Yes [provider]  famotidine (PEPCID) 40 MG tablet Take 40 mg by mouth at bedtime.   Yes [provider]  hydrochlorothiazide (HYDRODIURIL) 25 MG tablet Take 1 tablet (25 mg total) by mouth daily. 02/12/21  Yes Colon Branch, MD  levothyroxine (SYNTHROID) 125 MCG tablet Take 1 tablet (125 mcg total) by mouth daily before breakfast. 11/17/20  Yes Paz, Alda Berthold, MD  losartan (COZAAR) 100 MG tablet Take 1 tablet (100 mg total) by mouth daily. 02/12/21  Yes Colon Branch, MD  Magnesium 400 MG CAPS Take 400 mg by mouth daily.   Yes [provider]  metoprolol tartrate (LOPRESSOR) 25 MG tablet Take 0.5 tablets (12.5 mg total) by mouth daily. 12/21/20  Yes Paz, Alda Berthold, MD  Multiple Vitamins-Minerals (CENTRUM SILVER PO) Take 1 tablet by mouth daily.   Yes [provider]  Multiple Vitamins-Minerals (PRESERVISION AREDS 2+MULTI VIT) CAPS Take 1 capsule by mouth 2 (two) times daily.    Yes [provider]  Omega-3 Fatty Acids (OMEGA 3 500 PO) Take 1,000 mg by mouth daily. Has 1000mg  capsules   Yes [provider]  potassium chloride (KLOR-CON) 10 MEQ tablet Take 1 tablet (10 mEq total) by mouth daily. 01/01/21  Yes Paz, Alda Berthold, MD  rosuvastatin (CRESTOR) 20 MG tablet Take 1 tablet (20 mg total) by mouth daily. 10/13/20  Yes Paz, Alda Berthold, MD  predniSONE (DELTASONE) 10 MG tablet 4 tablets x 2 days, 3 tabs x 2 days, 2 tabs x 2 days, 1 tab x 2 days Patient not taking: No sig reported 01/23/21   Colon Branch, MD      Allergies  Allergen Reactions  . Povidone-Iodine     REACTION: rash locally  . Ofloxacin     Affected sleep with eye drop formulation  . Tape Rash    Cloth adhesive tape-per patient Cloth adhesive tape-per patient Cloth adhesive tape-per patient    ROS:  Out of a complete 14 system review of symptoms, the patient complains only of the following symptoms, and all  other reviewed systems are negative.  Left foot discomfort and numbness Leg swelling  Blood pressure (!) 148/77, pulse 60, height 6' 0.6" (1.844 m), weight 283 lb 9.6 oz (128.6 kg).  Physical Exam  General: The patient is alert and cooperative at the time of the examination.  The patient is markedly obese.  Eyes: Pupils are equal, round, and  reactive to light. Discs are flat bilaterally.  Neck: The neck is supple, no carotid bruits are noted.  Respiratory: The respiratory examination is clear.  Cardiovascular: The cardiovascular examination reveals a regular rate and rhythm, no obvious murmurs or rubs are noted.  Skin: Extremities are with 1-2+ edema below the knees bilaterally.  Neurologic Exam  Mental status: The patient is alert and oriented x 3 at the time of the examination. The patient has apparent normal recent and remote memory, with an apparently normal attention span and concentration ability.  Cranial nerves: Facial symmetry is present. There is good sensation of the face to pinprick and soft touch bilaterally. The strength of the facial muscles and the muscles to head turning and shoulder shrug are normal bilaterally. Speech is well enunciated, no aphasia or dysarthria is noted. Extraocular movements are full. Visual fields are full. The tongue is midline, and the patient has symmetric elevation of the soft palate. No obvious hearing deficits are noted.  Motor: The motor testing reveals 5 over 5 strength of all 4 extremities. Good symmetric motor tone is noted throughout.  Sensory: Sensory testing is intact to pinprick, soft touch, vibration sensation, and position sense on all 4 extremities, with exception of some stocking pinprick sensory changes up to the knee on the right leg, not on the left. No evidence of extinction is noted.  Coordination: Cerebellar testing reveals good finger-nose-finger and heel-to-shin bilaterally.  Gait and station: Gait is minimally  wide-based, the patient can walk independently.  Tandem gait is unsteady.  Romberg is negative.  The patient is able to walk on heels and the toes bilaterally.  Reflexes: Deep tendon reflexes are symmetric, but are depressed bilaterally. Toes are downgoing bilaterally.   Assessment/Plan:  1.  Right leg pain, right foot numbness  The patient does not have any clear weakness of the right leg, he reports some sensory alteration in the right foot.  He no longer has the upper thigh pain on the right.  It is possible the patient may have some numbness in the S1 nerve root distribution, we will check nerve conduction studies on both legs, and EMG on the right leg.  We may consider MRI of the low back in the future.  He will follow-up for the above study.  [name] MD 03/08/2021 3:08 PM  [facility] [address] [place], [place] [address]  Phone [phone] Fax [phone]

## 2021-03-15 ENCOUNTER — Other Ambulatory Visit: Payer: Self-pay

## 2021-03-15 ENCOUNTER — Ambulatory Visit: Payer: Medicare HMO | Admitting: Pulmonary Disease

## 2021-03-15 ENCOUNTER — Encounter: Payer: Self-pay | Admitting: Pulmonary Disease

## 2021-03-15 DIAGNOSIS — E669 Obesity, unspecified: Secondary | ICD-10-CM | POA: Diagnosis not present

## 2021-03-15 DIAGNOSIS — G4733 Obstructive sleep apnea (adult) (pediatric): Secondary | ICD-10-CM

## 2021-03-15 DIAGNOSIS — Z9989 Dependence on other enabling machines and devices: Secondary | ICD-10-CM | POA: Diagnosis not present

## 2021-03-15 NOTE — Assessment & Plan Note (Signed)
Weight loss encouraged, compliance with goal of at least 4-6 hrs every night is the expectation. Advised against medications with sedative side effects Cautioned against driving when sleepy - understanding that sleepiness will vary on a day to day basis  

## 2021-03-15 NOTE — Patient Instructions (Signed)
CPAP @ 8 cm is working well CPAP supplies will be renewed x 1 year

## 2021-03-15 NOTE — Progress Notes (Signed)
   Subjective:    Patient ID: Steven Munoz, male    DOB: [birth year], 80 y.o.   MRN: [id number]  HPI  80 yo for FU of severe obstructive sleep apnea  -Water engineer Complaint  Patient presents with  . Follow-up    Reports no current respiratory concerns or issues with CPAP.    80 y follow-up. His machine is more than 80 years old, DME told him that he would be eligible for replacement.  CPAP is working well.  He is settled down with nasal pillows, and he needs supplies, had to replace straps. Pressure is okay, denies mask leak or dryness. Resting well, denies daytime naps Weight is steady   Significant tests/ events reviewed  PSG (289 Lbs) 2009 - severe OSA with predominant hypopneas, AHI 31/h corrected by CPAP +8 with large quattro mask . PLms  improved with titration  Review of Systems neg for any significant sore throat, dysphagia, itching, sneezing, nasal congestion or excess/ purulent secretions, fever, chills, sweats, unintended wt loss, pleuritic or exertional cp, hempoptysis, orthopnea pnd or change in chronic leg swelling. Also denies presyncope, palpitations, heartburn, abdominal pain, nausea, vomiting, diarrhea or change in bowel or urinary habits, dysuria,hematuria, rash, arthralgias, visual complaints, headache, numbness weakness or ataxia.     Objective:   Physical Exam  Gen. Pleasant, obese, in no distress ENT - no lesions, no post nasal drip Neck: No JVD, no thyromegaly, no carotid bruits Lungs: no use of accessory muscles, no dullness to percussion, decreased without rales or rhonchi  Cardiovascular: Rhythm regular, heart sounds  normal, no murmurs or gallops, 1+ peripheral edema Musculoskeletal: No deformities, no cyanosis or clubbing , no tremors       Assessment & Plan:

## 2021-03-15 NOTE — Assessment & Plan Note (Signed)
CPAP download was reviewed which shows excellent compliance, minimal leak on 8 cm with good control of events, compliance is more than 7.5 hours every night CPAP essentially helped improve his daytime somnolence and fatigue. CPAP supplies will be renewed for a year.  He will be eligible for new machine as and when he needs it

## 2021-03-19 DIAGNOSIS — M5459 Other low back pain: Secondary | ICD-10-CM | POA: Diagnosis not present

## 2021-03-19 DIAGNOSIS — M79672 Pain in left foot: Secondary | ICD-10-CM | POA: Diagnosis not present

## 2021-03-19 DIAGNOSIS — M79671 Pain in right foot: Secondary | ICD-10-CM | POA: Diagnosis not present

## 2021-03-19 DIAGNOSIS — M545 Low back pain, unspecified: Secondary | ICD-10-CM | POA: Diagnosis not present

## 2021-03-20 ENCOUNTER — Other Ambulatory Visit: Payer: Self-pay

## 2021-03-20 ENCOUNTER — Ambulatory Visit (INDEPENDENT_AMBULATORY_CARE_PROVIDER_SITE_OTHER): Payer: Medicare HMO | Admitting: Neurology

## 2021-03-20 ENCOUNTER — Ambulatory Visit: Payer: Medicare HMO | Admitting: Neurology

## 2021-03-20 ENCOUNTER — Other Ambulatory Visit: Payer: Self-pay | Admitting: Internal Medicine

## 2021-03-20 ENCOUNTER — Encounter: Payer: Self-pay | Admitting: Neurology

## 2021-03-20 DIAGNOSIS — G629 Polyneuropathy, unspecified: Secondary | ICD-10-CM | POA: Insufficient documentation

## 2021-03-20 DIAGNOSIS — E538 Deficiency of other specified B group vitamins: Secondary | ICD-10-CM

## 2021-03-20 DIAGNOSIS — G609 Hereditary and idiopathic neuropathy, unspecified: Secondary | ICD-10-CM | POA: Diagnosis not present

## 2021-03-20 DIAGNOSIS — M5431 Sciatica, right side: Secondary | ICD-10-CM

## 2021-03-20 DIAGNOSIS — G4733 Obstructive sleep apnea (adult) (pediatric): Secondary | ICD-10-CM | POA: Diagnosis not present

## 2021-03-20 HISTORY — DX: Polyneuropathy, unspecified: G62.9

## 2021-03-20 NOTE — Procedures (Signed)
     HISTORY:  Steven Munoz is an 80 year old gentleman with a history of numbness in both feet.  The patient has had some sciatica type pain on the right leg but this has resolved since last seen.  He is being evaluated for the foot numbness.  NERVE CONDUCTION STUDIES:  Nerve conduction studies were performed on both the lower extremities.  The distal motor latencies for the peroneal and posterior tibial nerves were within normal limits bilaterally with exception of absence of response of the left posterior tibial nerve.  The motor amplitudes for the right posterior tibial nerve and for the right peroneal nerve were low, normal for the left peroneal nerve.  Slowing was seen for the peroneal nerve on the right, borderline normal nerve conduction velocities were seen for the left peroneal nerve and slowing was seen for the right posterior tibial nerve.  The sural and peroneal sensory latencies were unobtainable bilaterally.  The F-wave latency for the right posterior tibial nerve was prolonged, absent on the left.  EMG STUDIES:  EMG study was performed on the right lower extremity:  The tibialis anterior muscle reveals 2 to 4K motor units with full recruitment. No fibrillations or positive waves were seen. The peroneus tertius muscle reveals 2 to 4K motor units with full recruitment. No fibrillations or positive waves were seen. The medial gastrocnemius muscle reveals 1 to 3K motor units with full recruitment. No fibrillations or positive waves were seen. The vastus lateralis muscle reveals 2 to 4K motor units with full recruitment. No fibrillations or positive waves were seen. The iliopsoas muscle reveals 2 to 4K motor units with full recruitment. No fibrillations or positive waves were seen. The biceps femoris muscle (long head) reveals 2 to 4K motor units with full recruitment. No fibrillations or positive waves were seen. The lumbosacral paraspinal muscles were tested at 3 levels, and  revealed no abnormalities of insertional activity at all 3 levels tested. There was good relaxation.   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  EMG evaluation of the right lower extremity was relatively unremarkable, no clear evidence of an overlying lumbar or sacral radiculopathy was seen.  [name] MD 03/20/2021 3:25 PM  [facility] [address] [place], [place] [address]  Phone [phone] Fax [phone]

## 2021-03-20 NOTE — Progress Notes (Signed)
Please refer to EMG and nerve conduction procedure note.  

## 2021-03-20 NOTE — Progress Notes (Addendum)
The patient comes in for evaluation of right-sided sciatica, this has not been an issue for him recently.  He also has numbness in both feet.  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  The patient will be sent for blood work today to evaluate the peripheral neuropathy.  If the sciatica discomfort returns in the future, we will consider MRI of the lumbar spine.    [place]    Nerve / Sites Muscle Latency Ref. Amplitude Ref. Rel Amp Segments Distance Velocity Ref. Area    ms ms mV mV %  cm m/s m/s mVms  R Peroneal - EDB     Ankle EDB 3.9 ?6.5 0.9 ?2.0 100 Ankle - EDB 9   2.8     Fib head EDB 12.7  0.7  78.4 Fib head - Ankle 33 37 ?44 1.8     Pop fossa EDB 15.4  0.7  98.5 Pop fossa - Fib head 10 37 ?44 1.8         Pop fossa - Ankle      L Peroneal - EDB     Ankle EDB 4.3 ?6.5 2.1 ?2.0 100 Ankle - EDB 9   5.2     Fib head EDB 11.7  1.7  80.3 Fib head - Ankle 32 44 ?44 4.3     Pop fossa EDB 14.0  1.7  102 Pop fossa - Fib head 10 44 ?44 4.2         Pop fossa - Ankle      R Tibial - AH     Ankle AH 4.4 ?5.8 0.4 ?4.0 100 Ankle - AH 9   2.4     Pop fossa AH 17.1  0.1  17.2 Pop fossa - Ankle 41 32 ?41 1.6  L Tibial - AH     Ankle AH NR ?5.8 NR ?4.0 NR Ankle - AH 9   NR     Pop fossa AH NR  NR  NR Pop fossa - Ankle 42 NR ?41 NR             SNC    Nerve / Sites Rec. Site Peak Lat Ref.  Amp Ref. Segments Distance    ms ms V V  cm  R Sural - Ankle (Calf)     Calf Ankle NR ?4.4 NR ?6 Calf - Ankle 14  L Sural - Ankle (Calf)     Calf Ankle NR ?4.4 NR ?6 Calf - Ankle 14  R Superficial peroneal - Ankle     Lat leg Ankle NR ?4.4 NR ?6 Lat leg - Ankle 14  L Superficial peroneal - Ankle     Lat leg Ankle NR ?4.4 NR ?6 Lat leg - Ankle 14             F  Wave    Nerve F Lat Ref.   ms ms  R Tibial - AH 61.2 ?56.0  L Tibial - AH NR ?56.0

## 2021-03-21 LAB — ANGIOTENSIN CONVERTING ENZYME: Angio Convert Enzyme: 57 U/L (ref 14–82)

## 2021-03-21 LAB — LYME DISEASE SEROLOGY W/REFLEX: Lyme Total Antibody EIA: NEGATIVE

## 2021-03-21 LAB — VITAMIN B12: Vitamin B-12: 640 pg/mL (ref 232–1245)

## 2021-03-21 LAB — SEDIMENTATION RATE: Sed Rate: 3 mm/hr (ref 0–30)

## 2021-03-21 LAB — ANA W/REFLEX: Anti Nuclear Antibody (ANA): NEGATIVE

## 2021-03-21 LAB — HEPATITIS C ANTIBODY: Hep C Virus Ab: <0.1 s/co ratio (ref 0.0–0.9)

## 2021-03-22 ENCOUNTER — Telehealth: Payer: Self-pay | Admitting: Pharmacist

## 2021-03-22 NOTE — Chronic Care Management (AMB) (Signed)
Chronic Care Management Pharmacy Assistant   Name: Steven Munoz  MRN: [id number] DOB: May 01, 1941   Reason for Encounter: Disease State-Hypertension    Recent office visits:  02/26/21 [name], MD(PCP) Seen for gout. Colchicine twice daily for 10 days.Follow up in 3 months.  Recent consult visits:  03/20/21 Margette Fast, MD(Neurology) EMG Procedure  03/15/21 Kara Mead (Pulmonology) General Follow up.   03/08/21 Margette Fast, MD(Neurology) New patient visit for right leg pain.EMG ordered.   Hospital visits:  Medication Reconciliation was completed by comparing discharge summary, patient's EMR and Pharmacy list, and upon discussion with patient.  Admitted to the hospital on 02/28/21 due to Fall. Discharge date was 02/28/21. Discharged from [facility]?Medications Started at [facility] Discharge:?? -started none  Medication Changes at Hospital Discharge: -Changed none  Medications Discontinued at Hospital Discharge: -Stopped none  Medications that remain the same after Hospital Discharge:??  -All other medications will remain the same.    Medications: Outpatient Encounter Medications as of 03/22/2021  Medication Sig Note  . allopurinol (ZYLOPRIM) 100 MG tablet Take 1 tablet (100 mg total) by mouth daily.   Marland Kitchen aspirin EC 81 MG tablet Take 81 mg by mouth daily.   . Coenzyme Q10 (COQ10) 200 MG CAPS Take by mouth.   . colchicine 0.6 MG tablet Take 1 tablet (0.6 mg total) by mouth 2 (two) times daily as needed.   . diphenoxylate-atropine (LOMOTIL) 2.5-0.025 MG tablet Take by mouth as needed for diarrhea or loose stools.   . famotidine (PEPCID) 40 MG tablet Take 40 mg by mouth at bedtime.   . hydrochlorothiazide (HYDRODIURIL) 25 MG tablet Take 1 tablet (25 mg total) by mouth daily.   Marland Kitchen levothyroxine (SYNTHROID) 125 MCG tablet Take 1 tablet (125 mcg total) by mouth daily before breakfast.   . losartan (COZAAR) 100 MG tablet Take 1 tablet (100 mg  total) by mouth daily.   . Magnesium 400 MG CAPS Take 400 mg by mouth daily.   . metoprolol tartrate (LOPRESSOR) 25 MG tablet Take 0.5 tablets (12.5 mg total) by mouth daily.   . Multiple Vitamins-Minerals (CENTRUM SILVER PO) Take 1 tablet by mouth daily.   . Multiple Vitamins-Minerals (PRESERVISION AREDS 2+MULTI VIT) CAPS Take 1 capsule by mouth 2 (two) times daily.    . Omega-3 Fatty Acids (OMEGA 3 500 PO) Take 1,000 mg by mouth daily. Has 1033m capsules   . potassium chloride (KLOR-CON) 10 MEQ tablet Take 1 tablet (10 mEq total) by mouth daily.   . rosuvastatin (CRESTOR) 20 MG tablet Take 1 tablet (20 mg total) by mouth daily. 02/22/2021: Holding while taking colchicine   No facility-administered encounter medications on file as of 03/22/2021.   Reviewed chart prior to disease state call. Spoke with patient regarding BP  Recent Office Vitals: BP Readings from Last 3 Encounters:  03/15/21 122/66  03/08/21 (!) 148/77  02/28/21 (!) 126/112   Pulse Readings from Last 3 Encounters:  03/15/21 (!) 59  03/08/21 60  02/28/21 (!) 54    Wt Readings from Last 3 Encounters:  03/15/21 283 lb 3.2 oz (128.5 kg)  03/08/21 283 lb 9.6 oz (128.6 kg)  02/28/21 280 lb (127 kg)     Kidney Function Lab Results  Component Value Date/Time   CREATININE 0.89 02/26/2021 04:19 PM   CREATININE 0.85 01/01/2021 09:26 AM   CREATININE 0.84 08/21/2020 08:43 AM   CREATININE 1.03 07/04/2020 09:47 AM   GFR 81.08 02/26/2021 04:19 PM  GFRNONAA 85.51 05/18/2010 12:00 AM   GFRAA  06/01/2009 04:45 AM    >60        The eGFR has been calculated using the MDRD equation. This calculation has not been validated in all clinical situations. eGFR's persistently <60 mL/min signify possible Chronic Kidney Disease.    BMP Latest Ref Rng & Units 02/26/2021 01/01/2021 08/21/2020  Glucose 70 - 99 mg/dL 95 106(H) 110(H)  BUN 6 - 23 mg/dL '17 17 17  ' Creatinine 0.40 - 1.50 mg/dL 0.89 0.85 0.84  BUN/Creat Ratio 6 - 22 (calc)  - - NOT APPLICABLE  Sodium 282 - 145 mEq/L 139 142 140  Potassium 3.5 - 5.1 mEq/L 3.7 4.0 4.2  Chloride 96 - 112 mEq/L 101 104 102  CO2 19 - 32 mEq/L '29 29 30  ' Calcium 8.4 - 10.5 mg/dL 10.0 9.4 9.7    . Current antihypertensive regimen:  o Hydrochlorothiazide 25 mg take 1 tablet daily o Losartan 100 mg take 1 tablet daily o Metoprolol tartrate 25 mg take 0.5 tablets daily  . How often are you checking your Blood Pressure? Infrequently   . Current home BP readings:   . What recent interventions/DTPs have been made by any provider to improve Blood Pressure control since last CPP Visit: none noted  . Any recent hospitalizations or ED visits since last visit with CPP? Yes   . What diet changes have been made to improve Blood Pressure Control?  o   . What exercise is being done to improve your Blood Pressure Control?  o   Adherence Review: Is the patient currently on ACE/ARB medication? Yes Does the patient have >5 day gap between last estimated fill dates? No    Star Rating Drugs: Losartan 100 mg last filled 02/17/21 90 DS Rosuvastatin 20 mg last filled 01/17/21 90 DS  Called patient x 4, he was unable to talk each time.  Lizbeth Bark Clinical Pharmacist Assistant [phone]

## 2021-03-23 LAB — PROTEIN ELECTROPHORESIS, URINE REFLEX
Albumin ELP, Urine: 23.5 %
Alpha-1-Globulin, U: 6.1 %
Alpha-2-Globulin, U: 11.6 %
Beta Globulin, U: 37.9 %
Gamma Globulin, U: 20.8 %
Protein, Ur: 10.6 mg/dL

## 2021-03-27 ENCOUNTER — Telehealth: Payer: Self-pay | Admitting: Neurology

## 2021-03-27 NOTE — Telephone Encounter (Signed)
Pt has called back in response to a missed call and message he received re: results.  Pt is asking for a call back please.

## 2021-03-28 NOTE — Telephone Encounter (Signed)
Called patient and he stated that his mychart shows all his tests are back and said Dr. Jannifer Franklin would call him and discuss the results with him.  He said he is feeling better, like all his symptoms have resolved and he started back to the gym yesterday.    Patient denied further questions, verbalized understanding and expressed appreciation for the phone call.

## 2021-04-04 ENCOUNTER — Other Ambulatory Visit: Payer: Self-pay | Admitting: Internal Medicine

## 2021-04-19 ENCOUNTER — Ambulatory Visit (INDEPENDENT_AMBULATORY_CARE_PROVIDER_SITE_OTHER): Payer: Medicare HMO | Admitting: Internal Medicine

## 2021-04-19 ENCOUNTER — Other Ambulatory Visit: Payer: Self-pay

## 2021-04-19 ENCOUNTER — Encounter: Payer: Self-pay | Admitting: Internal Medicine

## 2021-04-19 VITALS — BP 134/72 | HR 78 | Temp 97.8°F | Resp 18 | Ht 73.0 in | Wt 282.4 lb

## 2021-04-19 DIAGNOSIS — G609 Hereditary and idiopathic neuropathy, unspecified: Secondary | ICD-10-CM | POA: Diagnosis not present

## 2021-04-19 DIAGNOSIS — M109 Gout, unspecified: Secondary | ICD-10-CM | POA: Diagnosis not present

## 2021-04-19 DIAGNOSIS — Z09 Encounter for follow-up examination after completed treatment for conditions other than malignant neoplasm: Secondary | ICD-10-CM

## 2021-04-19 MED ORDER — GABAPENTIN 100 MG PO CAPS: 100.0000 mg | ORAL_CAPSULE | Freq: Three times a day (TID) | ORAL | 3 refills | Status: DC

## 2021-04-19 NOTE — Progress Notes (Signed)
Subjective:    Patient ID: Steven Munoz, male    DOB: [birth year], 80 y.o.   MRN: [id number]  DOS:  04/19/2021 Type of visit - description: Follow-up  Was referred to neurology for paresthesias, diagnosis was neuropathy. Symptoms are still there on and off, sometimes severe.  He continue with gout problems. He started allopurinol, stopped colchicine, developed pain at the ankle so he went back on colchicine.   Review of Systems See above   Past Medical History:  Diagnosis Date   CAD (coronary artery disease) CABG  05/29/09    OPERATIVE PROCEDURES:  Median sternotomy, extracorporeal circulation,    Colonic polyp 2002, 2005, 2009   [name]   Dermatophytosis of nail    Diverticulosis    Dysmetabolic syndrome    GERD (gastroesophageal reflux disease)    S/P dilation  2005   Gout    Hyperlipidemia    Hypertension    Hypothyroidism    Murmur    Nephrolithiasis    X 1   Peripheral neuropathy 03/20/2021   Pneumonia age 50   treated as inpatient   Sleep apnea    on CPAP; Dr [name]    Past Surgical History:  Procedure Laterality Date   athroscopy     right knee X2, left knee X1   COLONOSCOPY  2002 & 2009 &2014   polypectomy; [name]   CORONARY ARTERY BYPASS GRAFT  2010   Median sternotomy, extracorporeal circulation, coronary bypass graft surgery x3 using a left internal mammary artery graft to left anterior descending coronary artery, with a sequential  saphenous vein graft to posterior descending and posterolateral branches  of the right coronary artery.  Endoscopic vein harvesting from the right   EYE SURGERY Right    tear duct   Nail Alvusion Bilateral    Hallux   NOSE SURGERY  11/20/12   L max antrectomy;septoplasty;turbinate reduction. Dr Rheney, [place]     right 2003   REPLACEMENT TOTAL KNEE     partial Left March 2009   TENDON RELEASE Right 03/2016   from R hip. @ [place]  02/22/2014   right hip; Dr  [facility]    Allergies as of 04/19/2021       Reactions   Povidone-iodine    REACTION: rash locally   Ofloxacin    Affected sleep with eye drop formulation   Tape Rash   Cloth adhesive tape-per patient Cloth adhesive tape-per patient Cloth adhesive tape-per patient        Medication List        Accurate as of April 19, 2021 10:33 PM. If you have any questions, ask your nurse or doctor.          allopurinol 100 MG tablet Commonly known as: ZYLOPRIM Take 1 tablet (100 mg total) by mouth daily.   aspirin EC 81 MG tablet Take 81 mg by mouth daily.   CENTRUM SILVER PO Take 1 tablet by mouth daily.   PreserVision AREDS 2+Multi Vit Caps Take 1 capsule by mouth 2 (two) times daily.   colchicine 0.6 MG tablet Take 1 tablet (0.6 mg total) by mouth 2 (two) times daily as needed.   CoQ10 200 MG Caps Take by mouth.   diphenoxylate-atropine 2.5-0.025 MG tablet Commonly known as: LOMOTIL Take by mouth as needed for diarrhea or loose stools.   famotidine 40 MG tablet Commonly known as: PEPCID Take 40 mg by mouth at bedtime.  gabapentin 100 MG capsule Commonly known as: NEURONTIN Take 1 capsule (100 mg total) by mouth 3 (three) times daily. Started by: [name], MD   hydrochlorothiazide 25 MG tablet Commonly known as: HYDRODIURIL Take 1 tablet (25 mg total) by mouth daily.   levothyroxine 125 MCG tablet Commonly known as: SYNTHROID Take 1 tablet (125 mcg total) by mouth daily before breakfast.   losartan 100 MG tablet Commonly known as: COZAAR Take 1 tablet (100 mg total) by mouth daily.   Magnesium 400 MG Caps Take 400 mg by mouth daily.   metoprolol tartrate 25 MG tablet Commonly known as: LOPRESSOR Take 0.5 tablets (12.5 mg total) by mouth daily.   OMEGA 3 500 PO Take 1,000 mg by mouth daily. Has 1000mg  capsules   potassium chloride 10 MEQ tablet Commonly known as: KLOR-CON Take 1 tablet (10 mEq total) by mouth daily.   rosuvastatin 20 MG  tablet Commonly known as: CRESTOR Take 1 tablet (20 mg total) by mouth daily.           Objective:   Physical Exam BP 134/72 (BP Location: Left Arm, Patient Position: Sitting, Cuff Size: Normal)   Pulse 78   Temp 97.8 F (36.6 C) (Oral)   Resp 18   Ht 6\' 1"  (1.854 m)   Wt 282 lb 6 oz (128.1 kg)   SpO2 97%   BMI 37.25 kg/m  General:   Well developed, NAD, BMI noted. HEENT:  Normocephalic . Face symmetric, atraumatic Lower extremities: no pretibial edema bilaterally  Skin: Not pale. Not jaundice Neurologic:  alert & oriented X3.  Speech normal, gait appropriate for age and unassisted Psych--  Cognition and judgment appear intact.  Cooperative with normal attention span and concentration.  Behavior appropriate. No anxious or depressed appearing.      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  No clear evidence of radiculopathy. 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  Extensive blood work was negative. From my standpoint diagnosis is primary polyneuropathy ( possibly from hyperglycemia?) Recommend to start gabapentin slowly, will escalate the dose over the next few months. NCS 03/20/21 sensorimotor peripheral neuropathy of moderate severity, primarily axonal.  EMG evaluation of the right lower extremity was relatively unremarkable, no clear evidence of an overlying lumbar or sacral radiculopathy was seen. Gout: Patient is started  allopurinol and rx colchicine preemptively for 10 days, after he is stopped colchicine he had pain at the ankle and self re-started colchicine until now. He is somewhat confused about symptoms of paresthesia and gout, patient educated about gout being typically single joint that is warm, swollen and tender. Plan: Refer to rheumatology, continue allopurinol, hold colchicine and use it only if true gout symptoms. RTC September for a physical exam.  30 min spent in pt education and chart review This visit occurred during the SARS-CoV-2 public health emergency.  Safety protocols were in place, including screening questions prior to the visit, additional usage of staff PPE, and extensive cleaning  of exam room while observing appropriate contact time as indicated for disinfecting solutions.

## 2021-04-19 NOTE — Assessment & Plan Note (Signed)
Polyneuropathy: Due to paresthesias, was referred to neurology, nerve conduction study report reviewed, see below.  Extensive blood work was negative. From my standpoint diagnosis is primary polyneuropathy ( possibly from hyperglycemia?) Recommend to start gabapentin slowly, will escalate the dose over the next few months. NCS 03/20/21 sensorimotor peripheral neuropathy of moderate severity, primarily axonal.  EMG evaluation of the right lower extremity was relatively unremarkable, no clear evidence of an overlying lumbar or sacral radiculopathy was seen. Gout: Patient is started allopurinol and rx colchicine preemptively for 10 days, after he is stopped colchicine he had pain at the ankle and self re-started colchicine until now. He is somewhat confused about symptoms of paresthesia and gout, patient educated about gout being typically single joint that is warm, swollen and tender. Plan: Refer to rheumatology, continue allopurinol, hold colchicine and use it only if true gout symptoms. RTC September for a physical exam.

## 2021-04-19 NOTE — Patient Instructions (Addendum)
We are referring you to the rheumatology regards gout  Stop colchicine, restart only if you are sure you have a gout episode  For neuropathy: Start gabapentin: 1 tablet at night for 1 week Then 1 tablet twice a day for 1 week Then 1 tablet 3 times a day     See you in September for a physical

## 2021-05-02 ENCOUNTER — Other Ambulatory Visit: Payer: Self-pay

## 2021-05-02 ENCOUNTER — Ambulatory Visit: Payer: Medicare HMO | Admitting: Internal Medicine

## 2021-05-02 ENCOUNTER — Encounter: Payer: Self-pay | Admitting: Internal Medicine

## 2021-05-02 VITALS — BP 144/76 | HR 51 | Ht 70.5 in | Wt 287.6 lb

## 2021-05-02 DIAGNOSIS — M109 Gout, unspecified: Secondary | ICD-10-CM | POA: Diagnosis not present

## 2021-05-02 DIAGNOSIS — Z8739 Personal history of other diseases of the musculoskeletal system and connective tissue: Secondary | ICD-10-CM

## 2021-05-02 DIAGNOSIS — R600 Localized edema: Secondary | ICD-10-CM

## 2021-05-02 DIAGNOSIS — G609 Hereditary and idiopathic neuropathy, unspecified: Secondary | ICD-10-CM | POA: Diagnosis not present

## 2021-05-02 MED ORDER — ALLOPURINOL 100 MG PO TABS: 100.0000 mg | ORAL_TABLET | Freq: Every day | ORAL | 1 refills | Status: DC

## 2021-05-02 NOTE — Patient Instructions (Signed)
Allopurinol Tablets What is this medication? ALLOPURINOL (al oh PURE i nole) treats gout or kidney stones. It may also be used to prevent high uric acid levels after chemotherapy. It works bydecreasing uric acid levels in your body. This medicine may be used for other purposes; ask your health care provider orpharmacist if you have questions. COMMON BRAND NAME(S): Zyloprim What should I tell my care team before I take this medication? They need to know if you have any of these conditions: Kidney disease Liver disease An unusual or allergic reaction to allopurinol, other medications, foods, dyes, or preservatives Pregnant or trying to get pregnant Breast-feeding How should I use this medication? Take this medication by mouth with a glass of water. Follow the directions on the prescription label. If this medication upsets your stomach, take it with food or milk. Take your doses at regular intervals. Do not take your medicationmore often than directed. Talk to your care team regarding the use of this medication in children. Special care may be needed. While this medication may be prescribed forchildren as young as 6 years for selected conditions, precautions do apply. Overdosage: If you think you have taken too much of this medicine contact apoison control center or emergency room at once. NOTE: This medicine is only for you. Do not share this medicine with others. What if I miss a dose? If you miss a dose, take it as soon as you can. If it is almost time for yournext dose, take only that dose. Do not take double or extra doses. What may interact with this medication? Do not take this medication with the following: Didanosine, ddI This medication may also interact with the following: Certain antibiotics like amoxicillin, ampicillin Certain medications for cancer Certain medications for immunosuppression like azathioprine, cyclosporine,  mercaptopurine Chlorpropamide Probenecid Sulfinpyrazone Thiazide diuretics, like hydrochlorothiazide Warfarin This list may not describe all possible interactions. Give your health care provider a list of all the medicines, herbs, non-prescription drugs, or dietary supplements you use. Also tell them if you smoke, drink alcohol, or use illegaldrugs. Some items may interact with your medicine. What should I watch for while using this medication? Visit your care team for regular checks on your progress. If you are taking this medication to treat gout, you may not have less frequent attacks at first. Keep taking your medication regularly and the attacks should get better within 2 to 6 weeks. Drink plenty of water (10 to 12 full glasses a day) while you are taking this medication. This will help to reduce stomach upset and reduce therisk of getting gout or kidney stones. Call your care team at once if you get a skin rash together with chills, fever, sore throat, or nausea and vomiting, if you have blood in your urine, ordifficulty passing urine. This medication may cause serious skin reactions. They can happen weeks to months after starting the medication. Contact your care team right away if you notice fevers or flu-like symptoms with a rash. The rash may be red or purple and then turn into blisters or peeling of the skin. Or, you might notice a red rash with swelling of the face, lips or lymph nodes in your neck or under yourarms. Do not take vitamin C without asking your care team. Too much vitamin C canincrease the chance of getting kidney stones. You may get drowsy or dizzy. Do not drive, use machinery, or do anything that needs mental alertness until you know how this medication affects you. Do not stand  or sit up quickly, especially if you are an older patient. This reduces the risk of dizzy or fainting spells. Alcohol can make you more drowsy and dizzy. Alcohol can also increase the chance of stomach  problems and increasethe amount of uric acid in your blood. Avoid alcoholic drinks. What side effects may I notice from receiving this medication? Side effects that you should report to your care team as soon as possible: Allergic reactions-skin rash, itching, hives, swelling of the face, lips, tongue, or throat Kidney injury-decrease in the amount of urine, swelling of the ankles, hands, or feet Liver injury-right upper belly pain, loss of appetite, nausea, light-colored stool, dark yellow or brown urine, yellowing skin or eyes, unusual weakness or fatigue Rash, fever, and swollen lymph nodes Redness, blistering, peeling, or loosening of the skin, including inside the mouth Side effects that usually do not require medical attention (report to your careteam if they continue or are bothersome): Diarrhea Drowsiness Nausea Vomiting This list may not describe all possible side effects. Call your doctor for medical advice about side effects. You may report side effects to FDA at1-800-FDA-1088. Where should I keep my medication? Keep out of the reach of children and pets. Store at room temperature between 15 and 25 degrees C (59 and 77 degrees F). Protect from light and moisture. Throw away any unused medication after theexpiration date. Uric Acid Test Why am I having this test? Uric acid is a chemical that gets released when certain substances in the body (purines) break down. Normally, uric acid is filtered out of the blood by the kidneys, and then it leaves the body through urine. If your body makes too much uric acid, or if your kidneys are not removing enough of it, uric acid can start to form crystals that build up in your joints or kidneys. Uric acid crystals in your joints can cause a type of arthritis (gout). Crystals in your urine can form kidney stones. You may have a uric acid test: If you have joint pain or swelling that may be caused by gout. If you frequently have kidney stones. To  help diagnose or monitor treatment of gout. To monitor radiation or chemotherapy treatment. To monitor kidney function or diagnose kidney disorders. What is being tested? This test measures the amount of uric acid in your blood or urine. What kind of sample is taken? This test may be done with a blood sample or a urine sample. A blood sample is usually collected by inserting a needle into a blood vessel. You may have a blood sample taken if: You are receiving treatment that increases purines in your body, such as radiation or chemotherapy. You have joint pain that may be caused by gout. You may have a urine sample taken if you have kidney stones. You may be asked to collect urine samples at home over a period of 24 hours. How do I collect samples at home? When collecting a urine sample at home, make sure you: Use supplies and instructions that you received from the lab. Collect urine only in the germ-free (sterile) cup that you received from the lab. Do not let any toilet paper or stool (feces) get into the cup. Refrigerate the sample until you can return it to the lab. Return the sample(s) to the lab as instructed. How do I prepare for this test? Follow instructions from your health care provider about: Eating or drinking restrictions. You may need to stop eating and drinking everything except water starting 4  hours before the test. Changing or stopping your regular medicines. Some medicines can affect uric acid levels. Tell a health care provider about: Recent intense exercise. If you have recently exercised a lot, this may affect your test results. Any allergies you have. All medicines you are taking, including vitamins, herbs, eye drops, creams, and over-the-counter medicines. Any blood disorders you have. Any surgeries you have had. Any medical conditions you have. Whether you are pregnant or may be pregnant. How are the results reported? Your results will be reported as a value  that indicates how much uric acid is in your blood or urine. Your health care provider will compare your results to normal ranges that were established after testing a large group of people (reference ranges). Reference ranges may vary among labs and hospitals. For this test, common reference ranges are: Blood test: Adult male: 4.0-8.5 mg/dL or 0.24-0.51 mmol/L. Adult male: 2.7-7.3 mg/dL or 0.16-0.43 mmol/L. Child: 2.5-5.5 mg/dL or 0.12-0.32 mmol/L. Newborn: 2.0-6.2 mg/dL. Urine test: 250-750 mg/24 hr or 1.48-4.43 mmol/day (SI units). What do the results mean? Results within your reference range are considered normal, meaning that youhave a normal amount of uric acid in your body. Results that are higher than your reference range may mean that: Your body is making too much uric acid. Your kidneys are not removing enough uric acid. Your gout treatment plan needs to be adjusted, if applicable. You may need more tests to determine what is causing high uric acid levels. Possible causes include: Gout. Kidney disease. Cancer or cancer treatment. A diet high in purines. Alcohol abuse. Diabetes (diabetes mellitus). Results that are below your reference range mean that you have too little uric acid in your body. This is usually caused by certain medicines and is usuallynot serious. Talk with your health care provider about what your results mean. Questions to ask your health care provider Ask your health care provider, or the department that is doing the test: When will my results be ready? How will I get my results? What are my treatment options? What other tests do I need? What are my next steps? Summary Uric acid is a chemical that gets released when certain substances in the body (purines) break down. If your body makes too much uric acid, or if your kidneys are not removing enough of it, uric acid can start to form crystals that build up in your joints or kidneys. Uric acid crystals in  your joints can cause a type of arthritis (gout). Crystals in your urine can form kidney stones. This test measures the amount of uric acid in your blood or urine. This information is not intended to replace advice given to you by your health care provider. Make sure you discuss any questions you have with your healthcare provider. Document Revised: 01/28/2020 Document Reviewed: 01/28/2020 Elsevier Patient Education  2022 [employer].

## 2021-05-02 NOTE — Progress Notes (Signed)
Office Visit Note  Patient: Steven Munoz             Date of Birth: September 18, 1941           MRN: [id number]             PCP: Colon Branch, MD Referring: Colon Branch, MD Visit Date: 05/02/2021  Subjective:  New Patient (Initial Visit) (Patient is taking Allopurinol 100 mg daily and Colchicine PRN. Patient's last gout flare was over 2 months ago. Patient's most recent gout flare was in the right ankle. Patient also complains of mild hand pain, specifically bilateral thumbs. )   History of Present Illness: Steven Munoz is a 80 y.o. male here for gout currently on allopurinol 100 mg daily and colchicine 0.6 mg PRN.  He takes HCTZ and losartan for hypertension and has history of CABG for CAD but without any chronic loop diuretic use currently.  He states his previous history of gout was at least about 10 years ago he experienced redness swelling and pain around the left great toe that lasted for a few days and improved with treatment of oral prednisone.  He did not have recurrent or chronic symptoms similar to this inflammation.  He has had some chronic osteoarthritis joint pains with bilateral pain with hand use and bony nodules he had right knee arthroplasty and has some left knee osteoarthritis and crepitus.  Earlier this year he developed pain and swelling with hot to the touch around the right ankle joint.  He took colchicine for this that improved his symptoms but when discontinuing the colchicine after 2 weeks he experienced recurrence of the pain and inflammation.  He went back on the medicine and after about another 3 weeks of treatment he discontinued this with no return of his symptoms.  He was also started on allopurinol 100 mg daily.  Around the beginning of this episode he experienced increased right-sided sciatica type pain extending from the low back to the right knee.  Due to the sciatica he saw neurology for evaluation work-up demonstrated that he also has peripheral neuropathy  probably contributing to pain in his feet as well with recent NCS and serologic testing at [facility] for this.  The symptoms overall are doing well today so he has not started taking the prescribed gabapentin at all for neuropathy.  Labs reviewed 02/2021 Uric acid 6.1 Hgb A1c 5.7% CBC wnl CMP wnl UPEP wnl Lyme neg HCV neg ANA neg ACE neg ESR wnl B12 wnl  Imaging reviewed 01/30/21 Xray right ankle Mild degenerative arthritis  Activities of Daily Living:  Patient reports morning stiffness for 0 minutes.   Patient Denies nocturnal pain.  Difficulty dressing/grooming: Denies Difficulty climbing stairs: Denies Difficulty getting out of chair: Denies Difficulty using hands for taps, buttons, cutlery, and/or writing: Denies  Review of Systems  Constitutional:  Negative for fatigue.  HENT:  Negative for mouth sores, mouth dryness and nose dryness.   Eyes:  Positive for visual disturbance. Negative for pain, itching and dryness.  Respiratory:  Negative for cough, hemoptysis, shortness of breath and difficulty breathing.   Cardiovascular:  Positive for swelling in legs/feet. Negative for chest pain and palpitations.  Gastrointestinal:  Negative for abdominal pain, blood in stool, constipation and diarrhea.  Endocrine: Negative for increased urination.  Genitourinary:  Negative for painful urination.  Musculoskeletal:  Positive for joint pain and joint pain. Negative for joint swelling, myalgias, muscle weakness, morning stiffness, muscle tenderness and myalgias.  Skin:  Negative for color change, rash and redness.  Allergic/Immunologic: Negative for susceptible to infections.  Neurological:  Positive for numbness. Negative for dizziness, headaches, memory loss and weakness.  Hematological:  Negative for swollen glands.  Psychiatric/Behavioral:  Negative for confusion and sleep disturbance.    PMFS History:  Patient Active Problem List   Diagnosis Date Noted   Peripheral neuropathy  03/20/2021   Acute gout of right ankle 01/30/2021   Annual physical exam 06/06/2016   PCP NOTES >>>>>>>>>>>> 02/13/2016   CAD (coronary artery disease) 02/13/2016   Pedal edema 01/25/2016   Obesity (BMI 30-39.9) 06/02/2014   CTS (carpal tunnel syndrome) 06/01/2014   Varicose veins 04/07/2014   Prolonged P-R interval 03/04/2013   NEPHROLITHIASIS, HX OF 05/18/2010   ATRIAL FIBRILLATION 06/20/2009   CORONARY ARTERY BYPASS GRAFT, HX OF 06/20/2009   PREMATURE VENTRICULAR CONTRACTIONS 05/09/2009   MURMUR 05/08/2009   Essential hypertension 04/10/2009   DIVERTICULOSIS, COLON 04/10/2009   G E R D 08/05/2008   OSA on CPAP 08/05/2008   DERMATOPHYTOSIS OF NAIL 07/29/2008   Nocturia 05/30/2008   Hypothyroidism 02/15/2008   Elevated lipids 02/15/2008   History of gout 35/00/9381   DYSMETABOLIC SYNDROME X 82/99/3716   History of colonic polyps 01/14/2008    Past Medical History:  Diagnosis Date   CAD (coronary artery disease) CABG  05/29/09    OPERATIVE PROCEDURES:  Median sternotomy, extracorporeal circulation,    Colonic polyp 2002, 2005, 2009   [name]   Dermatophytosis of nail    Diverticulosis    Dysmetabolic syndrome    GERD (gastroesophageal reflux disease)    S/P dilation  2005   Gout    Hyperlipidemia    Hypertension    Hypothyroidism    Murmur    Nephrolithiasis    X 1   Peripheral neuropathy 03/20/2021   Pneumonia age 6   treated as inpatient   Sleep apnea    on CPAP; Dr [name]    Family History  Problem Relation Age of Onset   Hypertension Mother    Transient ischemic attack Mother    Lung cancer Mother        liver cancer, smoker, TIA   Liver cancer Mother    Heart attack Father 67   Heart attack Brother 8       smoker   Diabetes Neg Hx    Colon cancer Neg Hx    Prostate cancer Neg Hx    Past Surgical History:  Procedure Laterality Date   athroscopy     right knee X2, left knee X1   COLONOSCOPY  2002 & 2009 &2014   polypectomy; [name]   CORONARY  ARTERY BYPASS GRAFT  2010   Median sternotomy, extracorporeal circulation, coronary bypass graft surgery x3 using a left internal mammary artery graft to left anterior descending coronary artery, with a sequential  saphenous vein graft to posterior descending and posterolateral branches  of the right coronary artery.  Endoscopic vein harvesting from the right   EYE SURGERY Right    tear duct   Nail Alvusion Bilateral    Hallux   NOSE SURGERY  11/20/12   L max antrectomy;septoplasty;turbinate reduction. Dr Rheney, [place]     right 2003   REPLACEMENT TOTAL KNEE     partial Left March 2009   TENDON RELEASE Right 03/2016   from R hip. @ [place]  02/22/2014   right hip; Dr [facility]   Social History  Social History Narrative   Socially; lives with spouse   Right Handed   Drinks no caffeine   Immunization History  Administered Date(s) Administered   Fluad Quad(high Dose 65+) 07/06/2019   Influenza Split 08/21/2011, 08/26/2012   Influenza Whole 07/29/2008, 08/10/2009, 08/28/2010   Influenza, High Dose Seasonal PF 08/13/2016, 08/12/2017, 08/26/2018, 07/04/2020   Influenza,inj,Quad PF,6+ Mos 08/31/2013, 08/16/2014, 07/25/2015   PFIZER Comirnaty(Gray Top)Covid-19 Tri-Sucrose Vaccine 03/22/2021   PFIZER(Purple Top)SARS-COV-2 Vaccination 11/10/2019, 11/30/2019, 08/26/2020   Pneumococcal Conjugate-13 05/09/2015   Pneumococcal Polysaccharide-23 05/27/2011   Td 05/18/2010   Tdap 07/04/2020   Zoster, Live 06/06/2016     Objective: Vital Signs: BP (!) 144/76 (BP Location: Right Arm, Patient Position: Sitting, Cuff Size: Large)   Pulse (!) 51   Ht 5' 10.5" (1.791 m)   Wt 287 lb 9.6 oz (130.5 kg)   BMI 40.68 kg/m    Physical Exam Constitutional:      Appearance: He is obese.  Cardiovascular:     Rate and Rhythm: Normal rate and regular rhythm.     Comments: Early systolic murmur present Pulmonary:     Effort: Pulmonary effort  is normal.     Breath sounds: Normal breath sounds.  Skin:    General: Skin is warm and dry.     Findings: No rash.     Comments: 1+ pitting edema present over bilateral shins, mild chronic skin hyperpigmentation in the distal half of both shins  Neurological:     Mental Status: He is alert.     Deep Tendon Reflexes: Reflexes normal.  Psychiatric:        Mood and Affect: Mood normal.     Musculoskeletal Exam:  Neck full ROM no tenderness Shoulders full ROM no tenderness or swelling Elbows full ROM no tenderness or swelling Wrists full ROM no tenderness or swelling Bony enlargement at PIP aspect and especially DIP joints of bilateral hands most advanced in the right second DIP, mild squaring of CMC joints without significant first MCP hyperextension, no tenderness or synovitis Knees full ROM no tenderness or swelling, left knee patellofemoral crepitus present Ankles full ROM no tenderness or swelling bilaterally MTPs decreased flexion extension range of motion in bilateral first MTP joints, right 3rd-5th toes partially fused, no palpable swelling or warmth    Investigation: No additional findings.  Imaging: No results found.  Recent Labs: Lab Results  Component Value Date   WBC 6.4 02/26/2021   HGB 14.8 02/26/2021   PLT 181.0 02/26/2021   NA 139 02/26/2021   K 3.7 02/26/2021   CL 101 02/26/2021   CO2 29 02/26/2021   GLUCOSE 95 02/26/2021   BUN 17 02/26/2021   CREATININE 0.89 02/26/2021   BILITOT 0.7 02/26/2021   ALKPHOS 74 02/26/2021   AST 25 02/26/2021   ALT 30 02/26/2021   PROT 7.0 02/26/2021   ALBUMIN 4.5 02/26/2021   CALCIUM 10.0 02/26/2021   GFRAA  06/01/2009    >60        The eGFR has been calculated using the MDRD equation. This calculation has not been validated in all clinical situations. eGFR's persistently <60 mL/min signify possible Chronic Kidney Disease.    Speciality Comments: No specialty comments available.  Procedures:  No procedures  performed Allergies: Povidone-iodine, Ofloxacin, and Tape   Assessment / Plan:     Visit Diagnoses: Acute gout of right ankle, unspecified cause  History of gout - Plan: Uric acid, Basic Metabolic Panel (BMET), allopurinol (ZYLOPRIM) 100 MG tablet  Sounds like  symptoms were very infrequent for preceding several years.  No evidence of erosive change seen on review of the ankle x-ray.  There are no tophaceous deposits appreciable on physical exam today.  I am not entirely certain if the prolonged symptoms lasting for more than a month due to characterize an acute gout flare would remain suspicious for alternate etiology as well.  Plan to continue allopurinol at 100 mg recheck uric acid level today if this is at goal less than 6 we can see if symptoms remain suppressed if symptoms do return I recommended he contact clinic would like to see this acutely.  Suspected recent gout attack with chronic history of gout though  Pedal edema  There is pedal edema present but no stasis dermatitis changes in his described ankle pain is very well localized around the tibiotalar subtalar joint areas.  He is not currently on any loop diuretics, does take HCTZ, but also on losartan.  Idiopathic peripheral neuropathy  Recent nerve conduction study indicating peripheral neuropathy he has not started any gabapentin treatment for this medication sounds like the plan is to follow-up with neurology if he experiences another recurrent episode of sciatica or neurologic changes.  Orders: Orders Placed This Encounter  Procedures   Uric acid   Basic Metabolic Panel (BMET)    Meds ordered this encounter  Medications   allopurinol (ZYLOPRIM) 100 MG tablet    Sig: Take 1 tablet (100 mg total) by mouth daily.    Dispense:  90 tablet    Refill:  1      Follow-Up Instructions: Return in about 6 months (around 11/02/2021) for Gout on allopurinol f/u 65mo.   CCollier Salina MD  Note - This record has been  created using [employer]  Chart creation errors have been sought, but may not always  have been located. Such creation errors do not reflect on  the standard of medical care.

## 2021-05-03 LAB — BASIC METABOLIC PANEL WITH GFR
BUN: 15 mg/dL (ref 7–25)
CO2: 29 mmol/L (ref 20–32)
Calcium: 9.6 mg/dL (ref 8.6–10.3)
Chloride: 104 mmol/L (ref 98–110)
Creat: 0.96 mg/dL (ref 0.70–1.11)
Glucose, Bld: 99 mg/dL (ref 65–99)
Potassium: 4.1 mmol/L (ref 3.5–5.3)
Sodium: 141 mmol/L (ref 135–146)

## 2021-05-03 LAB — URIC ACID: Uric Acid, Serum: 5.5 mg/dL (ref 4.0–8.0)

## 2021-05-03 NOTE — Progress Notes (Signed)
Uric acid is 5.5 which is at the goal on allopurinol 100 mg daily.  I recommend he continue this medicine for now this should prevent recurrence of gout flares.  If he does experience symptoms worsening again he should contact the clinic we can take another look otherwise can follow-up next year.

## 2021-05-07 ENCOUNTER — Telehealth: Payer: Self-pay | Admitting: Neurology

## 2021-05-07 NOTE — Telephone Encounter (Signed)
Pt called, for 3 days had pain all the way down right leg, ain is gone now. Would like a call from the nurse.

## 2021-05-07 NOTE — Telephone Encounter (Signed)
I called pt. No answer, left a message asking pt to call me back.   

## 2021-05-08 NOTE — Telephone Encounter (Signed)
See mychart message from 05/07/21.

## 2021-05-09 ENCOUNTER — Ambulatory Visit (INDEPENDENT_AMBULATORY_CARE_PROVIDER_SITE_OTHER): Payer: Medicare HMO | Admitting: Internal Medicine

## 2021-05-09 ENCOUNTER — Encounter: Payer: Self-pay | Admitting: Internal Medicine

## 2021-05-09 ENCOUNTER — Other Ambulatory Visit: Payer: Self-pay

## 2021-05-09 VITALS — BP 132/70 | HR 55 | Temp 97.8°F | Resp 18 | Ht 70.5 in | Wt 287.0 lb

## 2021-05-09 DIAGNOSIS — R42 Dizziness and giddiness: Secondary | ICD-10-CM | POA: Diagnosis not present

## 2021-05-09 NOTE — Progress Notes (Signed)
Subjective:    Patient ID: Steven Munoz, male    DOB: [birth year], 80 y.o.   MRN: [id number]  DOS:  05/09/2021 Type of visit - description: Acute visit.  saw son about 2 weeks ago (dentist)- laying back during getting teeth cleaned when he sat up room was spinning, then later that night he got up to go to the bathroom-walls were spinning, denies ear pain, sinus congestion  Since then, he is having on and off dizziness typically triggered by waking up in the middle of the night and going to the restroom. Also happened when he was in bed and turns around to the right.  No chest pain, difficulty breathing or palpitation No headache, diplopia, slurred speech or motor deficits.   Review of Systems See above   Past Medical History:  Diagnosis Date   CAD (coronary artery disease) CABG  05/29/09    OPERATIVE PROCEDURES:  Median sternotomy, extracorporeal circulation,    Colonic polyp 2002, 2005, 2009   [name]   Dermatophytosis of nail    Diverticulosis    Dysmetabolic syndrome    GERD (gastroesophageal reflux disease)    S/P dilation  2005   Gout    Hyperlipidemia    Hypertension    Hypothyroidism    Murmur    Nephrolithiasis    X 1   Peripheral neuropathy 03/20/2021   Pneumonia age 32   treated as inpatient   Sleep apnea    on CPAP; Dr [name]    Past Surgical History:  Procedure Laterality Date   athroscopy     right knee X2, left knee X1   COLONOSCOPY  2002 & 2009 &2014   polypectomy; [name]   CORONARY ARTERY BYPASS GRAFT  2010   Median sternotomy, extracorporeal circulation, coronary bypass graft surgery x3 using a left internal mammary artery graft to left anterior descending coronary artery, with a sequential  saphenous vein graft to posterior descending and posterolateral branches  of the right coronary artery.  Endoscopic vein harvesting from the right   EYE SURGERY Right    tear duct   Nail Alvusion Bilateral    Hallux   NOSE SURGERY  11/20/12   L max  antrectomy;septoplasty;turbinate reduction. Dr Rheney, [place]     right 2003   REPLACEMENT TOTAL KNEE     partial Left March 2009   TENDON RELEASE Right 03/2016   from R hip. @ [place]  02/22/2014   right hip; [facility]    Allergies as of 05/09/2021       Reactions   Povidone-iodine    REACTION: rash locally   Ofloxacin    Affected sleep with eye drop formulation   Tape Rash   Cloth adhesive tape-per patient Cloth adhesive tape-per patient Cloth adhesive tape-per patient        Medication List        Accurate as of May 09, 2021 11:59 PM. If you have any questions, ask your nurse or doctor.          allopurinol 100 MG tablet Commonly known as: ZYLOPRIM Take 1 tablet (100 mg total) by mouth daily.   aspirin EC 81 MG tablet Take 81 mg by mouth daily.   CENTRUM SILVER PO Take 1 tablet by mouth daily.   PreserVision AREDS 2+Multi Vit Caps Take 1 capsule by mouth 2 (two) times daily.   colchicine 0.6 MG tablet Take 1 tablet (0.6 mg total)  by mouth 2 (two) times daily as needed.   CoQ10 200 MG Caps Take by mouth.   diphenoxylate-atropine 2.5-0.025 MG tablet Commonly known as: LOMOTIL Take by mouth as needed for diarrhea or loose stools.   famotidine 40 MG tablet Commonly known as: PEPCID Take 40 mg by mouth at bedtime.   gabapentin 100 MG capsule Commonly known as: NEURONTIN Take 1 capsule (100 mg total) by mouth 3 (three) times daily.   hydrochlorothiazide 25 MG tablet Commonly known as: HYDRODIURIL Take 1 tablet (25 mg total) by mouth daily.   hydrocortisone 2.5 % cream Apply 1 application topically as directed.   levothyroxine 125 MCG tablet Commonly known as: SYNTHROID Take 1 tablet (125 mcg total) by mouth daily before breakfast.   losartan 100 MG tablet Commonly known as: COZAAR Take 1 tablet (100 mg total) by mouth daily.   Magnesium 400 MG Caps Take 400 mg by mouth daily.    metoprolol tartrate 25 MG tablet Commonly known as: LOPRESSOR Take 0.5 tablets (12.5 mg total) by mouth daily.   OMEGA 3 500 PO Take 1,000 mg by mouth daily. Has 1000mg  capsules   potassium chloride 10 MEQ tablet Commonly known as: KLOR-CON Take 1 tablet (10 mEq total) by mouth daily.   rosuvastatin 20 MG tablet Commonly known as: CRESTOR Take 1 tablet (20 mg total) by mouth daily.   sodium chloride 0.65 % nasal spray Commonly known as: OCEAN Saline Nasal 0.65 % spray aerosol  Take by nasal route.           Objective:   Physical Exam BP 132/70 (BP Location: Left Arm, Patient Position: Sitting, Cuff Size: Normal)   Pulse (!) 55   Temp 97.8 F (36.6 C) (Oral)   Resp 18   Ht 5' 10.5" (1.791 m)   Wt 287 lb (130.2 kg)   SpO2 96%   BMI 40.60 kg/m  General:   Well developed, NAD, BMI noted. HEENT:  Normocephalic . Face symmetric, atraumatic Lungs:  CTA B Normal respiratory effort, no intercostal retractions, no accessory muscle use. Heart: RRR, soft systolic murmur.  Lower extremities: no pretibial edema bilaterally  Skin: Not pale. Not jaundice Neurologic:  alert & oriented X3.  Speech normal, gait appropriate for age and unassisted. EOMI, motor symmetric.  Romberg absent Psych--  Cognition and judgment appear intact.  Cooperative with normal attention span and concentration.  Behavior appropriate. No anxious or depressed appearing.      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  No clear evidence of radiculopathy. 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 We discussed the following: -only way to be certain would be to do further evaluation with MRI, etc. -also discussed meclizine, vestibular rehabilitation, good hydration. At this point we agreed on: Refer to PT for vestibular rehab, fall prevention >> need to steady himself before getting up. Red flag symptoms such as headache, severe symptoms, slurred speech discussed. He verbalized understanding    This visit occurred during the SARS-CoV-2 public  health emergency.  Safety protocols were in place, including screening questions prior to the visit, additional usage of staff PPE, and extensive cleaning of exam room while observing appropriate contact time as indicated for disinfecting solutions.

## 2021-05-10 NOTE — Assessment & Plan Note (Signed)
Dizziness: As described above, suspect a peripheral issue on clinical grounds. We discussed the following: -only way to be certain would be to do further evaluation with MRI, etc. -also discussed meclizine, vestibular rehabilitation, good hydration. At this point we agreed on: Refer to PT for vestibular rehab, fall prevention >> need to steady himself before getting up. Red flag symptoms such as headache, severe symptoms, slurred speech discussed. He verbalized understanding

## 2021-05-11 DIAGNOSIS — H81392 Other peripheral vertigo, left ear: Secondary | ICD-10-CM | POA: Diagnosis not present

## 2021-05-11 DIAGNOSIS — R42 Dizziness and giddiness: Secondary | ICD-10-CM | POA: Diagnosis not present

## 2021-05-11 DIAGNOSIS — R262 Difficulty in walking, not elsewhere classified: Secondary | ICD-10-CM | POA: Diagnosis not present

## 2021-05-11 DIAGNOSIS — H818X2 Other disorders of vestibular function, left ear: Secondary | ICD-10-CM | POA: Diagnosis not present

## 2021-05-14 DIAGNOSIS — R42 Dizziness and giddiness: Secondary | ICD-10-CM | POA: Diagnosis not present

## 2021-05-14 DIAGNOSIS — H81392 Other peripheral vertigo, left ear: Secondary | ICD-10-CM | POA: Diagnosis not present

## 2021-05-14 DIAGNOSIS — H818X2 Other disorders of vestibular function, left ear: Secondary | ICD-10-CM | POA: Diagnosis not present

## 2021-05-14 DIAGNOSIS — R262 Difficulty in walking, not elsewhere classified: Secondary | ICD-10-CM | POA: Diagnosis not present

## 2021-05-15 ENCOUNTER — Ambulatory Visit (INDEPENDENT_AMBULATORY_CARE_PROVIDER_SITE_OTHER): Payer: Medicare HMO | Admitting: Pharmacist

## 2021-05-15 DIAGNOSIS — I10 Essential (primary) hypertension: Secondary | ICD-10-CM

## 2021-05-15 DIAGNOSIS — M109 Gout, unspecified: Secondary | ICD-10-CM

## 2021-05-15 DIAGNOSIS — E785 Hyperlipidemia, unspecified: Secondary | ICD-10-CM | POA: Diagnosis not present

## 2021-05-15 DIAGNOSIS — G609 Hereditary and idiopathic neuropathy, unspecified: Secondary | ICD-10-CM

## 2021-05-15 DIAGNOSIS — I25810 Atherosclerosis of coronary artery bypass graft(s) without angina pectoris: Secondary | ICD-10-CM | POA: Diagnosis not present

## 2021-05-15 NOTE — Patient Instructions (Signed)
Visit Information  PATIENT GOALS:  Goals Addressed             This Visit's Progress    Chronic Care Management Pharmacy Care Plan   On track    CARE PLAN ENTRY (see longitudinal plan of care for additional care plan information)  Current Barriers:  Chronic Disease Management support, education, and care coordination needs related to Gout, HTN, HLD, CAD, Hypothyroidism, GERD   Hypertension BP Readings from Last 3 Encounters:  05/09/21 132/70  05/02/21 (!) 144/76  04/19/21 134/72  Pharmacist Clinical Goal(s): Over the next 180 days, patient will work with PharmD and providers to maintain BP goal <130/80 Current regimen:  Losartan 100mg  daily Hydrochlorithiazide 25mg  daily Metoprolol tartrate 25mg  1/2 tab daily Interventions: Discussed blood pressure goal Patient self care activities - Over the next 180 days, patient will: Maintain hypertension medication regimen.  Check blood pressure 2 or 3 times per week, record and bring to future appointments.   Hyperlipidemia Lab Results  Component Value Date/Time   [facility] 64 07/04/2020 09:47 AM  Pharmacist Clinical Goal(s): Over the next 180 days, patient will work with PharmD and providers to maintain LDL goal < 70 Current regimen:  Rosuvastatin 20mg  daily Interventions: Discussed importance of adherence Patient self care activities - Over the next 180 days, patient will: Maintain cholesterol medication regimen.   Gout:  Pharmacist Clinical Goal(s): Over the next 180 days, patient will work with PharmD and providers to decreased gout exacerbations Current regimen:  Colchicine 0.6mg  up to twice a day as needed for gout Allopurinol 100mg  daily Tyelnol 325mg  - take 1 or 2 tablets up to every 8 hours as needed for pain Interventions: Discussed foods that can increase uric acid / gout Consider preventative after acute episode controlled.  Patient self care activities - Over the next 180 days, patient will: Avoid foods that  can increase uric acid.  Continue to take allopurinol daily  Continue to use colchicine as needed for gout flares.   Hypothyroidism Pharmacist Clinical Goal(s) Over the next 90 days, patient will work with PharmD and providers to achieve TSH within normal limits Current regimen:  Levothyroxine 176mcg daily Patient self care activities - Over the next 90 days, patient will: Maintain hypothyroidism medication regimen  Pre-Diabetes Lab Results  Component Value Date/Time   HGBA1C 5.7 02/26/2021 04:19 PM   HGBA1C 5.5 07/04/2020 09:47 AM  Pharmacist Clinical Goal(s): Over the next 180 days, patient will work with PharmD and providers to maintain A1c goal  <5.7% Current regimen:  Diet and exercise management   Patient self care activities - Over the next 180 days, patient will: Maintain a1c <5.7% Continue to limit intake of sugar Discussed limiting portion sizes especially when eating out.  Continue to exercise regularly - goal is to get at least 150 minutes of physical activity per week  Neuropathy / nerve pain:  Pharmacist Clinical Goal(s): Over the next 180 days, patient will work with PharmD and providers to manage nerve pain Current regimen:  Gabapentin 100mg  up to 3 times a day (not currently taking) Intervention:   Discussed that if he starts gabapentin to start with just 100mg  at bedtime for 3 to 5 day, then increase as needed by 100mg  every 3 to 5 days until taking 100mg  3 times  a day Patient self care activities - Over the next 180 days, patient will: Consider starting gabapentin if needed for nerve pain  Medication management Pharmacist Clinical Goal(s): Over the next 180 days, patient will work  with PharmD and providers to maintain optimal medication adherence [facility] Interventions Comprehensive medication review performed. Continue current medication management strategy Patient self care activities - Over the next 180 days, patient  will: Focus on medication adherence by filling and taking medications appropriately  Take medications as prescribed Report any questions or concerns to PharmD and/or provider(s)  Please see past updates related to this goal by clicking on the "Past Updates" button in the selected goal         The patient verbalized understanding of instructions, educational materials, and care plan provided today and declined offer to receive copy of patient instructions, educational materials, and care plan.   Telephone follow up appointment with care management team member scheduled for: 3 to 4 months  Cherre Robins, PharmD Clinical Pharmacist [place] [employer] [facility]

## 2021-05-15 NOTE — Chronic Care Management (AMB) (Signed)
Chronic Care Management Pharmacy Note  05/15/2021 Name:  Steven Munoz MRN:  [id number] DOB:  1940/12/04  Subjective: Steven Munoz is an 80 y.o. year old male who is a primary patient of Paz, Alda Berthold, MD.  The CCM team was consulted for assistance with disease management and care coordination needs.    Engaged with patient by telephone for follow up visit in response to provider referral for pharmacy case management and/or care coordination services.   Consent to Services:  The patient was given information about Chronic Care Management services, agreed to services, and gave verbal consent prior to initiation of services.  Please see initial visit note for detailed documentation.   Patient Care Team: Colon Branch, MD as PCP - General (Internal Medicine) Josue Hector, MD as PCP - Cardiology (Cardiology) Hallows, Verlee Monte, MD as Consulting Physician (Orthopedic Surgery) Rigoberto Noel, MD as Consulting Physician (Pulmonary Disease) Richmond Campbell, MD as Consulting Physician (Gastroenterology) Shon Hough, MD as Consulting Physician (Ophthalmology) Francee Gentile, [place] as Referring Physician (Chiropractic Medicine) Cherre Robins, PharmD (Pharmacist) Latanya Maudlin, MD as Consulting Physician (Orthopedic Surgery)  Recent office visits: 05/09/2021 - PCP (Dr Larose Kells) dizziness; recommmended PT for vestibular rehab;  04/19/2021 - PCP (Dr Larose Kells) F/U gout and neuropathy. Start gabapentin 150m tid;  02/26/2021 - PCP (Dr PLarose Kells F/U gout; Added allopurinol 105mdaily  Recent consult visits: 03/08/2021 - Neuro (Dr WiJannifer Franklinf/u sciatica; Ordered nerve conduction study an d EMG of right leg. No med changes.  03/15/2021 - Pulm (Dr AlElsworth SohoOSA f/u. No med  changes.  03/19/2021 - Ortho Surgery (Dr GiGladstone Lighterlow back and leg pain. No med changes.  03/20/2021 - Ortho Surgery (Dr GiGladstone Lighterreviewed EMG results. Diagnosed with peripheral neuropathy. No med change - lab checked.   Hospital  visits: 02/28/2021 - ED ViNumaFall with scalp hematoma. No LOC; No med changes.   Objective:  Lab Results  Component Value Date   CREATININE 0.96 05/02/2021   CREATININE 0.89 02/26/2021   CREATININE 0.85 01/01/2021    Lab Results  Component Value Date   HGBA1C 5.7 02/26/2021   Last diabetic Eye exam: No results found for: HMDIABEYEEXA  Last diabetic Foot exam: No results found for: HMDIABFOOTEX      Component Value Date/Time   CHOL 132 07/04/2020 0947   TRIG 156 (H) 07/04/2020 0947   HDL 45 07/04/2020 0947   CHOLHDL 2.9 07/04/2020 0947   VLDL 20.8 07/06/2019 0942   LDLCALC 64 07/04/2020 0947    Hepatic Function Latest Ref Rng & Units 02/26/2021 07/04/2020 07/06/2019  Total Protein 6.0 - 8.3 g/dL 7.0 6.9 6.9  Albumin 3.5 - 5.2 g/dL 4.5 - 4.3  AST 0 - 37 U/L _0 ALT 0 - 53 U/L _1 Alk Phosphatase 39 - 117 U/L 74 - 63  Total Bilirubin 0.2 - 1.2 mg/dL 0.7 1.3(H) 1.2  Bilirubin, Direct 0.0 - 0.3 mg/dL - - -    Lab Results  Component Value Date/Time   TSH 0.43 01/01/2021 09:26 AM   TSH 1.52 10/04/2020 08:46 AM    CBC Latest Ref Rng & Units 02/26/2021 07/04/2020 07/06/2019  WBC 4.0 - 10.5 K/uL 6.4 5.5 5.4  Hemoglobin 13.0 - 17.0 g/dL 14.8 15.1 14.6  Hematocrit 39.0 - 52.0 % 43.1 44.7 42.2  Platelets 150.0 - 400.0 K/uL 181.0 174 178.0    No results found for: VD25OH  Clinical ASCVD: Yes  The ASCVD  Risk score Mikey Bussing DC Jr., et al., 2013) failed to calculate for the following reasons:   The 2013 ASCVD risk score is only valid for ages 75 to 8    Other: CHADS2VASc = 4  Social History   Tobacco Use  Smoking Status Former   Packs/day: 1.00   Years: 6.00   Pack years: 6.00   Types: Cigarettes   Quit date: 10/28/1966   Years since quitting: 54.5  Smokeless Tobacco Never  Tobacco Comments   smoked 1962- 1968, up to < 1 ppd   BP Readings from Last 3 Encounters:  05/09/21 132/70  05/02/21 (!) 144/76  04/19/21 134/72   Pulse Readings from  Last 3 Encounters:  05/09/21 (!) 55  05/02/21 (!) 51  04/19/21 78   Wt Readings from Last 3 Encounters:  05/09/21 287 lb (130.2 kg)  05/02/21 287 lb 9.6 oz (130.5 kg)  04/19/21 282 lb 6 oz (128.1 kg)    Assessment: Review of patient past medical history, allergies, medications, health status, including review of consultants reports, laboratory and other test data, was performed as part of comprehensive evaluation and provision of chronic care management services.   SDOH:  (Social Determinants of Health) assessments and interventions performed:    CCM Care Plan  Allergies  Allergen Reactions   Povidone-Iodine     REACTION: rash locally   Ofloxacin     Affected sleep with eye drop formulation   Tape Rash    Cloth adhesive tape-per patient Cloth adhesive tape-per patient Cloth adhesive tape-per patient    Medications Reviewed Today     Reviewed by Colon Branch, MD (Physician) on 05/09/21 at 1119  Med List Status: <None>   Medication Order Taking? Sig Documenting Provider Last Dose Status Informant  allopurinol (ZYLOPRIM) 100 MG tablet [id number] Yes Take 1 tablet (100 mg total) by mouth daily. Collier Salina, MD Taking Active   aspirin EC 81 MG tablet [id number] Yes Take 81 mg by mouth daily. [provider] Taking Active   Coenzyme Q10 (COQ10) 200 MG CAPS [id number] Yes Take by mouth. [provider] Taking Active   colchicine 0.6 MG tablet [id number] Yes Take 1 tablet (0.6 mg total) by mouth 2 (two) times daily as needed. Colon Branch, MD Taking Active   diphenoxylate-atropine (LOMOTIL) 2.5-0.025 MG tablet [id number] Yes Take by mouth as needed for diarrhea or loose stools. [provider] Taking Active   famotidine (PEPCID) 40 MG tablet [id number] Yes Take 40 mg by mouth at bedtime. [provider] Taking Active   gabapentin (NEURONTIN) 100 MG capsule [id number] No Take 1 capsule (100 mg total) by mouth 3 (three) times daily.  Patient not  taking: No sig reported   Colon Branch, MD Not Taking Active   hydrochlorothiazide (HYDRODIURIL) 25 MG tablet [id number] Yes Take 1 tablet (25 mg total) by mouth daily. Colon Branch, MD Taking Active   hydrocortisone 2.5 % cream [id number] Yes Apply 1 application topically as directed. [provider] Taking Active   levothyroxine (SYNTHROID) 125 MCG tablet [id number] Yes Take 1 tablet (125 mcg total) by mouth daily before breakfast. Colon Branch, MD Taking Active   losartan (COZAAR) 100 MG tablet [id number] Yes Take 1 tablet (100 mg total) by mouth daily. Colon Branch, MD Taking Active   Magnesium 400 MG CAPS [id number] Yes Take 400 mg by mouth daily. [provider] Taking Active Self  metoprolol tartrate (LOPRESSOR) 25 MG tablet [id number] Yes Take 0.5 tablets (12.5  mg total) by mouth daily. Colon Branch, MD Taking Active   Multiple Vitamins-Minerals (CENTRUM SILVER PO) [id number] Yes Take 1 tablet by mouth daily. [provider] Taking Active   Multiple Vitamins-Minerals (PRESERVISION AREDS 2+MULTI VIT) CAPS [id number] Yes Take 1 capsule by mouth 2 (two) times daily.  [provider] Taking Active   Omega-3 Fatty Acids (OMEGA 3 500 PO) [id number] Yes Take 1,000 mg by mouth daily. Has 1057m capsules [provider] Taking Active   potassium chloride (KLOR-CON) 10 MEQ tablet [id number] Take 1 tablet (10 mEq total) by mouth daily. PColon Branch MD Taking Active   rosuvastatin (CRESTOR) 20 MG tablet [id number] Take 1 tablet (20 mg total) by mouth daily. PColon Branch MD Taking Active   sodium chloride (OCEAN) 0.65 % nasal spray [id number] Saline Nasal 0.65 % spray aerosol  Take by nasal route. 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   Prolonged P-R interval 03/04/2013   NEPHROLITHIASIS, HX OF 05/18/2010   ATRIAL FIBRILLATION 06/20/2009   CORONARY ARTERY BYPASS GRAFT, HX OF 06/20/2009   PREMATURE VENTRICULAR CONTRACTIONS 05/09/2009   MURMUR 05/08/2009   Essential hypertension 04/10/2009   DIVERTICULOSIS, COLON 04/10/2009   G E R D 08/05/2008   OSA on CPAP 08/05/2008   DERMATOPHYTOSIS OF NAIL 07/29/2008   Nocturia 05/30/2008   Hypothyroidism 02/15/2008   Elevated lipids 02/15/2008   History of gout 032/95/1884  DYSMETABOLIC SYNDROME X 016/60/6301  History of colonic polyps 01/14/2008    Immunization History  Administered Date(s) Administered   Fluad Quad(high Dose 65+) 07/06/2019   Influenza Split 08/21/2011, 08/26/2012   Influenza Whole 07/29/2008, 08/10/2009, 08/28/2010   Influenza, High Dose Seasonal PF 08/13/2016, 08/12/2017, 08/26/2018, 07/04/2020   Influenza,inj,Quad PF,6+ Mos 08/31/2013, 08/16/2014, 07/25/2015   PFIZER Comirnaty(Gray Top)Covid-19 Tri-Sucrose Vaccine 03/22/2021   PFIZER(Purple Top)SARS-COV-2 Vaccination 11/10/2019, 11/30/2019, 08/26/2020   Pneumococcal Conjugate-13 05/09/2015   Pneumococcal Polysaccharide-23 05/27/2011   Td 05/18/2010   Tdap 07/04/2020   Zoster, Live 06/06/2016    Conditions to be addressed/monitored: Atrial Fibrillation, CAD, HTN, HLD, and OSA; preDM; neuropathy; hypothyroidism  There are no care plans that you recently modified to display for this patient.   Medication Assistance: None required.  Patient affirms current coverage meets needs.  [facility] [id number]: [phone]: [phone] [id number] [place], [place] [id number]: [phone]: [phone] Follow Up:  Patient agrees to Care Plan and Follow-up.  Plan: Telephone follow up appointment with care management team member scheduled for:  3 to 4 months  TaCherre RobinsPharmD Clinical Pharmacist [facility] [facility]

## 2021-05-17 DIAGNOSIS — R262 Difficulty in walking, not elsewhere classified: Secondary | ICD-10-CM | POA: Diagnosis not present

## 2021-05-17 DIAGNOSIS — R42 Dizziness and giddiness: Secondary | ICD-10-CM | POA: Diagnosis not present

## 2021-05-17 DIAGNOSIS — H81392 Other peripheral vertigo, left ear: Secondary | ICD-10-CM | POA: Diagnosis not present

## 2021-05-17 DIAGNOSIS — H818X2 Other disorders of vestibular function, left ear: Secondary | ICD-10-CM | POA: Diagnosis not present

## 2021-05-28 ENCOUNTER — Ambulatory Visit: Payer: Medicare HMO | Admitting: Internal Medicine

## 2021-05-29 ENCOUNTER — Ambulatory Visit: Payer: Medicare HMO | Admitting: Internal Medicine

## 2021-05-29 DIAGNOSIS — H818X2 Other disorders of vestibular function, left ear: Secondary | ICD-10-CM | POA: Diagnosis not present

## 2021-05-29 DIAGNOSIS — H81392 Other peripheral vertigo, left ear: Secondary | ICD-10-CM | POA: Diagnosis not present

## 2021-05-29 DIAGNOSIS — R42 Dizziness and giddiness: Secondary | ICD-10-CM | POA: Diagnosis not present

## 2021-05-29 DIAGNOSIS — R262 Difficulty in walking, not elsewhere classified: Secondary | ICD-10-CM | POA: Diagnosis not present

## 2021-05-30 ENCOUNTER — Telehealth: Payer: Self-pay

## 2021-05-30 NOTE — Telephone Encounter (Signed)
Plan of care signed and faxed to [facility] at [phone]. Form sent for scanning.

## 2021-05-31 DIAGNOSIS — H81392 Other peripheral vertigo, left ear: Secondary | ICD-10-CM | POA: Diagnosis not present

## 2021-05-31 DIAGNOSIS — R262 Difficulty in walking, not elsewhere classified: Secondary | ICD-10-CM | POA: Diagnosis not present

## 2021-05-31 DIAGNOSIS — R42 Dizziness and giddiness: Secondary | ICD-10-CM | POA: Diagnosis not present

## 2021-05-31 DIAGNOSIS — H818X2 Other disorders of vestibular function, left ear: Secondary | ICD-10-CM | POA: Diagnosis not present

## 2021-06-05 DIAGNOSIS — H81392 Other peripheral vertigo, left ear: Secondary | ICD-10-CM | POA: Diagnosis not present

## 2021-06-05 DIAGNOSIS — R42 Dizziness and giddiness: Secondary | ICD-10-CM | POA: Diagnosis not present

## 2021-06-05 DIAGNOSIS — R262 Difficulty in walking, not elsewhere classified: Secondary | ICD-10-CM | POA: Diagnosis not present

## 2021-06-05 DIAGNOSIS — H818X2 Other disorders of vestibular function, left ear: Secondary | ICD-10-CM | POA: Diagnosis not present

## 2021-06-06 ENCOUNTER — Other Ambulatory Visit: Payer: Self-pay | Admitting: Internal Medicine

## 2021-06-07 DIAGNOSIS — H818X2 Other disorders of vestibular function, left ear: Secondary | ICD-10-CM | POA: Diagnosis not present

## 2021-06-07 DIAGNOSIS — R262 Difficulty in walking, not elsewhere classified: Secondary | ICD-10-CM | POA: Diagnosis not present

## 2021-06-07 DIAGNOSIS — R42 Dizziness and giddiness: Secondary | ICD-10-CM | POA: Diagnosis not present

## 2021-06-07 DIAGNOSIS — H81392 Other peripheral vertigo, left ear: Secondary | ICD-10-CM | POA: Diagnosis not present

## 2021-06-11 ENCOUNTER — Other Ambulatory Visit: Payer: Self-pay | Admitting: Internal Medicine

## 2021-06-15 DIAGNOSIS — R42 Dizziness and giddiness: Secondary | ICD-10-CM | POA: Diagnosis not present

## 2021-06-15 DIAGNOSIS — R262 Difficulty in walking, not elsewhere classified: Secondary | ICD-10-CM | POA: Diagnosis not present

## 2021-06-15 DIAGNOSIS — H81392 Other peripheral vertigo, left ear: Secondary | ICD-10-CM | POA: Diagnosis not present

## 2021-06-15 DIAGNOSIS — H818X2 Other disorders of vestibular function, left ear: Secondary | ICD-10-CM | POA: Diagnosis not present

## 2021-06-19 ENCOUNTER — Telehealth: Payer: Self-pay

## 2021-06-19 DIAGNOSIS — H81392 Other peripheral vertigo, left ear: Secondary | ICD-10-CM | POA: Diagnosis not present

## 2021-06-19 DIAGNOSIS — H818X2 Other disorders of vestibular function, left ear: Secondary | ICD-10-CM | POA: Diagnosis not present

## 2021-06-19 DIAGNOSIS — R262 Difficulty in walking, not elsewhere classified: Secondary | ICD-10-CM | POA: Diagnosis not present

## 2021-06-19 DIAGNOSIS — R42 Dizziness and giddiness: Secondary | ICD-10-CM | POA: Diagnosis not present

## 2021-06-19 NOTE — Telephone Encounter (Signed)
Plan of care signed and faxed back to [facility] at [phone]. Form sent for scanning.  

## 2021-06-21 DIAGNOSIS — H81392 Other peripheral vertigo, left ear: Secondary | ICD-10-CM | POA: Diagnosis not present

## 2021-06-21 DIAGNOSIS — R262 Difficulty in walking, not elsewhere classified: Secondary | ICD-10-CM | POA: Diagnosis not present

## 2021-06-21 DIAGNOSIS — R42 Dizziness and giddiness: Secondary | ICD-10-CM | POA: Diagnosis not present

## 2021-06-21 DIAGNOSIS — H818X2 Other disorders of vestibular function, left ear: Secondary | ICD-10-CM | POA: Diagnosis not present

## 2021-06-22 ENCOUNTER — Other Ambulatory Visit: Payer: Self-pay | Admitting: Internal Medicine

## 2021-06-26 DIAGNOSIS — H25813 Combined forms of age-related cataract, bilateral: Secondary | ICD-10-CM | POA: Diagnosis not present

## 2021-06-26 DIAGNOSIS — H2513 Age-related nuclear cataract, bilateral: Secondary | ICD-10-CM | POA: Diagnosis not present

## 2021-06-26 DIAGNOSIS — H353231 Exudative age-related macular degeneration, bilateral, with active choroidal neovascularization: Secondary | ICD-10-CM | POA: Diagnosis not present

## 2021-07-04 ENCOUNTER — Other Ambulatory Visit: Payer: Self-pay

## 2021-07-04 ENCOUNTER — Encounter: Payer: Self-pay | Admitting: Internal Medicine

## 2021-07-04 ENCOUNTER — Ambulatory Visit (INDEPENDENT_AMBULATORY_CARE_PROVIDER_SITE_OTHER): Payer: Medicare HMO | Admitting: Internal Medicine

## 2021-07-04 VITALS — BP 122/66 | HR 54 | Temp 98.2°F | Resp 18 | Ht 71.0 in | Wt 285.2 lb

## 2021-07-04 DIAGNOSIS — E039 Hypothyroidism, unspecified: Secondary | ICD-10-CM

## 2021-07-04 DIAGNOSIS — G609 Hereditary and idiopathic neuropathy, unspecified: Secondary | ICD-10-CM

## 2021-07-04 DIAGNOSIS — E785 Hyperlipidemia, unspecified: Secondary | ICD-10-CM | POA: Diagnosis not present

## 2021-07-04 DIAGNOSIS — G4733 Obstructive sleep apnea (adult) (pediatric): Secondary | ICD-10-CM | POA: Diagnosis not present

## 2021-07-04 DIAGNOSIS — Z0001 Encounter for general adult medical examination with abnormal findings: Secondary | ICD-10-CM

## 2021-07-04 DIAGNOSIS — R42 Dizziness and giddiness: Secondary | ICD-10-CM | POA: Diagnosis not present

## 2021-07-04 DIAGNOSIS — Z9989 Dependence on other enabling machines and devices: Secondary | ICD-10-CM

## 2021-07-04 DIAGNOSIS — Z0000 Encounter for general adult medical examination without abnormal findings: Secondary | ICD-10-CM | POA: Diagnosis not present

## 2021-07-04 LAB — TSH: TSH: 2.42 u[IU]/mL (ref 0.35–5.50)

## 2021-07-04 LAB — LIPID PANEL
Cholesterol: 129 mg/dL (ref 0–200)
HDL: 48.4 mg/dL (ref >39.00)
LDL Cholesterol: 57 mg/dL (ref 0–99)
NonHDL: 80.5
Total CHOL/HDL Ratio: 3
Triglycerides: 120 mg/dL (ref 0.0–149.0)
VLDL: 24 mg/dL (ref 0.0–40.0)

## 2021-07-04 NOTE — Progress Notes (Signed)
Subjective:    Patient ID: Steven Munoz, male    DOB: 11-May-1941, 80 y.o.   MRN: [id number]  DOS:  07/04/2021 Type of visit - description: CPX  In addition to CPX, we discussed all his routine medical problems. Dizziness: Undergoing PT, 90% better but he still gets dizzy mostly with certain head movements. Continue with neuropathy symptoms, not severe but he still would like some treatment   Review of Systems  Other than above, a 14 point review of systems is negative      Past Medical History:  Diagnosis Date   CAD (coronary artery disease) CABG  05/29/09    OPERATIVE PROCEDURES:  Median sternotomy, extracorporeal circulation,    Colonic polyp 2002, 2005, 2009   [name]   Dermatophytosis of nail    Diverticulosis    Dysmetabolic syndrome    GERD (gastroesophageal reflux disease)    S/P dilation  2005   Gout    Hyperlipidemia    Hypertension    Hypothyroidism    Murmur    Nephrolithiasis    X 1   Peripheral neuropathy 03/20/2021   Pneumonia age 66   treated as inpatient   Sleep apnea    on CPAP; Dr [name]    Past Surgical History:  Procedure Laterality Date   athroscopy     right knee X2, left knee X1   COLONOSCOPY  2002 & 2009 &2014   polypectomy; [name]   CORONARY ARTERY BYPASS GRAFT  2010   Median sternotomy, extracorporeal circulation, coronary bypass graft surgery x3 using a left internal mammary artery graft to left anterior descending coronary artery, with a sequential  saphenous vein graft to posterior descending and posterolateral branches  of the right coronary artery.  Endoscopic vein harvesting from the right   EYE SURGERY Right    tear duct   Nail Alvusion Bilateral    Hallux   NOSE SURGERY  11/20/12   L max antrectomy;septoplasty;turbinate reduction. Dr Rheney, [place]     right 2003   REPLACEMENT TOTAL KNEE     partial Left March 2009   TENDON RELEASE Right 03/2016   from R hip. @ [place]   02/22/2014   right hip; Dr [facility]   Social History   Socioeconomic History   Marital status: Married    Spouse name: [name]   Number of children: 2   Years of education: Not on file   Highest education level: Not on file  Occupational History   Occupation: retired- Chiropodist: [employer]  Tobacco Use   Smoking status: Former    Packs/day: 1.00    Years: 6.00    Pack years: 6.00    Types: Cigarettes    Quit date: 10/28/1966    Years since quitting: 54.7   Smokeless tobacco: Never   Tobacco comments:    smoked 1962- 1968, up to < 1 ppd  Vaping Use   Vaping Use: Never used  Substance and Sexual Activity   Alcohol use: Yes    Alcohol/week: 3.0 standard drinks    Types: 3 Glasses of wine per week    Comment: socially   Drug use: No   Sexual activity: Not on file  Other Topics Concern   Not on file  Social History Narrative   Socially; lives with spouse   Right Handed   Drinks no caffeine   Social Determinants of Health  Financial Resource Strain: Low Risk    Difficulty of Paying Living Expenses: Not hard at all  Food Insecurity: Not on file  Transportation Needs: No Transportation Needs   Lack of Transportation (Medical): No   Lack of Transportation (Non-Medical): No  Physical Activity: Not on file  Stress: Not on file  Social Connections: Not on file  Intimate Partner Violence: Not on file    Allergies as of 07/04/2021       Reactions   Povidone-iodine    REACTION: rash locally   Ofloxacin    Affected sleep with eye drop formulation   Tape Rash   Cloth adhesive tape-per patient Cloth adhesive tape-per patient Cloth adhesive tape-per patient        Medication List        Accurate as of July 04, 2021 11:59 PM. If you have any questions, ask your nurse or doctor.          allopurinol 100 MG tablet Commonly known as: ZYLOPRIM Take 1 tablet (100 mg total) by mouth daily.   aspirin EC 81 MG tablet Take 81  mg by mouth daily.   CENTRUM SILVER PO Take 1 tablet by mouth daily.   PreserVision AREDS 2+Multi Vit Caps Take 1 capsule by mouth 2 (two) times daily.   colchicine 0.6 MG tablet Take 1 tablet (0.6 mg total) by mouth 2 (two) times daily as needed.   CoQ10 200 MG Caps Take by mouth.   diphenoxylate-atropine 2.5-0.025 MG tablet Commonly known as: LOMOTIL Take by mouth as needed for diarrhea or loose stools.   famotidine 40 MG tablet Commonly known as: PEPCID Take 40 mg by mouth at bedtime.   gabapentin 100 MG capsule Commonly known as: NEURONTIN Take 1 capsule (100 mg total) by mouth 3 (three) times daily.   hydrochlorothiazide 25 MG tablet Commonly known as: HYDRODIURIL Take 1 tablet (25 mg total) by mouth daily.   hydrocortisone 2.5 % cream Apply 1 application topically as directed.   levothyroxine 125 MCG tablet Commonly known as: SYNTHROID TAKE 1 TABLET BY MOUTH DAILY BEFORE BREAKFAST.   losartan 100 MG tablet Commonly known as: COZAAR Take 1 tablet (100 mg total) by mouth daily.   Magnesium 400 MG Caps Take 400 mg by mouth daily.   metoprolol tartrate 25 MG tablet Commonly known as: LOPRESSOR TAKE 1/2 TABLET (12.5 MG   TOTAL) DAILY *MYLAN MFR*   OMEGA 3 500 PO Take 1,000 mg by mouth daily. Has '1000mg'$  capsules   potassium chloride 10 MEQ tablet Commonly known as: KLOR-CON Take 1 tablet (10 mEq total) by mouth daily.   rosuvastatin 20 MG tablet Commonly known as: CRESTOR Take 1 tablet (20 mg total) by mouth daily.   sodium chloride 0.65 % nasal spray Commonly known as: OCEAN Saline Nasal 0.65 % spray aerosol  Take by nasal route.           Objective:   Physical Exam BP 122/66 (BP Location: Left Arm, Patient Position: Sitting, Cuff Size: Normal)   Pulse (!) 54   Temp 98.2 F (36.8 C) (Oral)   Resp 18   Ht '5\' 11"'$  (1.803 m)   Wt 285 lb 4 oz (129.4 kg)   SpO2 94%   BMI 39.78 kg/m  General: Well developed, NAD, BMI noted Neck: No   thyromegaly  HEENT:  Normocephalic . Face symmetric, atraumatic Lungs:  CTA B Normal respiratory effort, no intercostal retractions, no accessory muscle use. Heart: RRR, soft systolic murmur.  Abdomen:  Not  distended, soft, non-tender. No rebound or rigidity.   Lower extremities: no pretibial edema bilaterally  Skin: Exposed areas without rash. Not pale. Not jaundice Neurologic:  alert & oriented X3.  Speech normal, gait appropriate for age and unassisted Strength symmetric and appropriate for age.  Psych: Cognition and judgment appear intact.  Cooperative with normal attention span and concentration.  Behavior appropriate. No anxious or depressed appearing.     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  No clear evidence of radiculopathy. 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 HTN: Ambulatory BPs when checked are normal, continue HCTZ, losartan, metoprolol and potassium supplements. Hyperlipidemia: On Crestor, checking labs. CAD: Asymptomatic. Hypothyroidism: Check TSH Dizziness: saw PT, doing rehab, 95% better, he is a still somewhat disturbed by the symptoms despite improving.  On looking back,He reports the symptoms are started after a fall 02/28/2021, at the ER CT head and cervical  spine were noted acute for any intracranial injury.  We agreed that if he continues to be disturbed by his symptoms we could send him back to neurology OSA: Good CPAP compliance Gout: On allopurinol, check uric acid. RTC 4 months  In addition to CPX I agree on his chronic medical problems, we had a long conversation about polyneuropathy, the role of gabapentin treating those symptoms, we also had a long conversation about dizziness including a review of the ER CT is done on May 2022.  This visit occurred during the SARS-CoV-2 public health emergency.  Safety protocols were in place, including screening questions prior to the visit, additional usage of staff PPE, and extensive cleaning of exam room while observing appropriate contact time as indicated for disinfecting solutions.

## 2021-07-04 NOTE — Patient Instructions (Addendum)
Recommend to proceed with the following vaccines at your pharmacy:  Flu shot this fall Consider covid vax # 5  Go back on gabapentin, if that is not helping your neuropathy we could go up on the dose.   If your dizziness does not continue improving let me know.  Check the  blood pressure regularly BP GOAL is between 110/65 and  135/85. If it is consistently higher or lower, let me know     GO TO THE LAB : Get the blood work     [place], [place] back for   checkup in 4 months     "Living will", "[place] of attorney": Advanced care planning  (If you already have a living will or healthcare power of attorney, please bring the copy to be scanned in your chart.)  Advance care planning is a process that supports adults in  understanding and sharing their preferences regarding future medical care.   The patient's preferences are recorded in documents called Advance Directives.    Advanced directives are completed (and can be modified at any time) while the patient is in full mental capacity.   The documentation should be available at all times to the patient, the family and the healthcare providers.  Bring in a copy to be scanned in your chart is an excellent idea and is recommended   This legal documents direct treatment decision making and/or appoint a surrogate to make the decision if the patient is not capable to do so.    Advance directives can be documented in many types of formats,  documents have names such as:  Lliving will  Durable power of attorney for healthcare (healthcare proxy or healthcare power of attorney)  Combined directives  Physician orders for life-sustaining treatment    More information at:  [email or web address]

## 2021-07-06 ENCOUNTER — Encounter: Payer: Self-pay | Admitting: Internal Medicine

## 2021-07-06 NOTE — Assessment & Plan Note (Signed)
Here for CPX Hyperglycemia: Last A1c satisfactory Polyneuropathy: He is not taking gabapentin, he thought it was exclusively from pain but I explained patient this could help his neuropathy symptoms, he will try again, he will call if he thinks we can increase the dose. HTN: Ambulatory BPs when checked are normal, continue HCTZ, losartan, metoprolol and potassium supplements. Hyperlipidemia: On Crestor, checking labs. CAD: Asymptomatic. Hypothyroidism: Check TSH Dizziness: saw PT, doing rehab, 95% better, he is a still somewhat disturbed by the symptoms despite improving.  On looking back,He reports the symptoms are started after a fall 02/28/2021, at the ER CT head and cervical spine were noted acute for any intracranial injury.  We agreed that if he continues to be disturbed by his symptoms we could send him back to neurology OSA: Good CPAP compliance Gout: On allopurinol, check uric acid. RTC 4 months

## 2021-07-06 NOTE — Assessment & Plan Note (Signed)
-  Td 2021 -pnm 23- 2012;  prevnar: 2016 -  zostavax 05-2016 (had Bell's Palsy shortly after it,  avoid shingrix for now) - had covid shots x 4, consider fifth shot. - flu shot recommended -CCS: multiple cscopes,  10-2012 Dr Cher Nakai; cscope again 08/2019, next per GI - prostate ca screening: aged out  -Labs reviewed, check FLP and TSH - diet/exercise: Discussed -POA discussed

## 2021-07-20 ENCOUNTER — Ambulatory Visit: Payer: Medicare HMO | Attending: Internal Medicine

## 2021-07-20 DIAGNOSIS — Z23 Encounter for immunization: Secondary | ICD-10-CM

## 2021-07-20 NOTE — Progress Notes (Signed)
   Covid-19 Vaccination Clinic  Name:  Steven Munoz    MRN: [id number] DOB: [birth year]  07/20/2021  [name] was observed post Covid-19 immunization for 15 minutes without incident. He was provided with Vaccine Information Sheet and instruction to access the V-Safe system.   [name] was instructed to call 911 with any severe reactions post vaccine: Difficulty breathing  Swelling of face and throat  A fast heartbeat  A bad rash all over body  Dizziness and weakness

## 2021-07-27 ENCOUNTER — Other Ambulatory Visit (HOSPITAL_BASED_OUTPATIENT_CLINIC_OR_DEPARTMENT_OTHER): Payer: Self-pay

## 2021-07-27 MED ORDER — COVID-19MRNA BIVAL VACC PFIZER 30 MCG/0.3ML IM SUSP
INTRAMUSCULAR | 0 refills | Status: DC
  Filled 2021-07-27: qty 0.3, 1d supply, fill #0

## 2021-07-31 DIAGNOSIS — R262 Difficulty in walking, not elsewhere classified: Secondary | ICD-10-CM | POA: Diagnosis not present

## 2021-07-31 DIAGNOSIS — H81392 Other peripheral vertigo, left ear: Secondary | ICD-10-CM | POA: Diagnosis not present

## 2021-07-31 DIAGNOSIS — R42 Dizziness and giddiness: Secondary | ICD-10-CM | POA: Diagnosis not present

## 2021-07-31 DIAGNOSIS — H818X2 Other disorders of vestibular function, left ear: Secondary | ICD-10-CM | POA: Diagnosis not present

## 2021-08-03 DIAGNOSIS — H818X2 Other disorders of vestibular function, left ear: Secondary | ICD-10-CM | POA: Diagnosis not present

## 2021-08-03 DIAGNOSIS — R42 Dizziness and giddiness: Secondary | ICD-10-CM | POA: Diagnosis not present

## 2021-08-03 DIAGNOSIS — R262 Difficulty in walking, not elsewhere classified: Secondary | ICD-10-CM | POA: Diagnosis not present

## 2021-08-03 DIAGNOSIS — H81392 Other peripheral vertigo, left ear: Secondary | ICD-10-CM | POA: Diagnosis not present

## 2021-08-07 ENCOUNTER — Ambulatory Visit (INDEPENDENT_AMBULATORY_CARE_PROVIDER_SITE_OTHER): Payer: Medicare HMO

## 2021-08-07 VITALS — Ht 71.0 in | Wt 281.0 lb

## 2021-08-07 DIAGNOSIS — Z0000 Encounter for general adult medical examination without abnormal findings: Secondary | ICD-10-CM

## 2021-08-07 NOTE — Patient Instructions (Signed)
Steven Munoz , Thank you for taking time to complete your Medicare Wellness Visit. I appreciate your ongoing commitment to your health goals. Please review the following plan we discussed and let me know if I can assist you in the future.   Screening recommendations/referrals: Colonoscopy: Completed 10/04/2019-Due 10/03/2024 Recommended yearly ophthalmology/optometry visit for glaucoma screening and checkup Recommended yearly dental visit for hygiene and checkup  Vaccinations: Influenza vaccine: Due-May obtain vaccine at our office or your local pharmacy. Pneumococcal vaccine: Up to date Tdap vaccine: Up to date-Due-07/04/2030 Shingles vaccine: Declined   Covid-19: Booster available at pharmacy  Advanced directives: Please bring a copy for your chart  Conditions/risks identified: See problem list  Next appointment: Follow up in one year for your annual wellness visit. 08/12/2022 @ 10:20.  Preventive Care 10 Years and Older, Male Preventive care refers to lifestyle choices and visits with your health care provider that can promote health and wellness. What does preventive care include? A yearly physical exam. This is also called an annual well check. Dental exams once or twice a year. Routine eye exams. Ask your health care provider how often you should have your eyes checked. Personal lifestyle choices, including: Daily care of your teeth and gums. Regular physical activity. Eating a healthy diet. Avoiding tobacco and drug use. Limiting alcohol use. Practicing safe sex. Taking low doses of aspirin every day. Taking vitamin and mineral supplements as recommended by your health care provider. What happens during an annual well check? The services and screenings done by your health care provider during your annual well check will depend on your age, overall health, lifestyle risk factors, and family history of disease. Counseling  Your health care provider may ask you questions about  your: Alcohol use. Tobacco use. Drug use. Emotional well-being. Home and relationship well-being. Sexual activity. Eating habits. History of falls. Memory and ability to understand (cognition). Work and work Statistician. Screening  You may have the following tests or measurements: Height, weight, and BMI. Blood pressure. Lipid and cholesterol levels. These may be checked every 5 years, or more frequently if you are over 41 years old. Skin check. Lung cancer screening. You may have this screening every year starting at age 73 if you have a 30-pack-year history of smoking and currently smoke or have quit within the past 15 years. Fecal occult blood test (FOBT) of the stool. You may have this test every year starting at age 12. Flexible sigmoidoscopy or colonoscopy. You may have a sigmoidoscopy every 5 years or a colonoscopy every 10 years starting at age 72. Prostate cancer screening. Recommendations will vary depending on your family history and other risks. Hepatitis C blood test. Hepatitis B blood test. Sexually transmitted disease (STD) testing. Diabetes screening. This is done by checking your blood sugar (glucose) after you have not eaten for a while (fasting). You may have this done every 1-3 years. Abdominal aortic aneurysm (AAA) screening. You may need this if you are a current or former smoker. Osteoporosis. You may be screened starting at age 74 if you are at high risk. Talk with your health care provider about your test results, treatment options, and if necessary, the need for more tests. Vaccines  Your health care provider may recommend certain vaccines, such as: Influenza vaccine. This is recommended every year. Tetanus, diphtheria, and acellular pertussis (Tdap, Td) vaccine. You may need a Td booster every 10 years. Zoster vaccine. You may need this after age 90. Pneumococcal 13-valent conjugate (PCV13) vaccine. One dose is  recommended after age 66. Pneumococcal  polysaccharide (PPSV23) vaccine. One dose is recommended after age 43. Talk to your health care provider about which screenings and vaccines you need and how often you need them. This information is not intended to replace advice given to you by your health care provider. Make sure you discuss any questions you have with your health care provider. Document Released: 11/10/2015 Document Revised: 07/03/2016 Document Reviewed: 08/15/2015 Elsevier Interactive Patient Education  2017 [place] Prevention in the Home Falls can cause injuries. They can happen to people of all ages. There are many things you can do to make your home safe and to help prevent falls. What can I do on the outside of my home? Regularly fix the edges of walkways and driveways and fix any cracks. Remove anything that might make you trip as you walk through a door, such as a raised step or threshold. Trim any bushes or trees on the path to your home. Use bright outdoor lighting. Clear any walking paths of anything that might make someone trip, such as rocks or tools. Regularly check to see if handrails are loose or broken. Make sure that both sides of any steps have handrails. Any raised decks and porches should have guardrails on the edges. Have any leaves, snow, or ice cleared regularly. Use sand or salt on walking paths during winter. Clean up any spills in your garage right away. This includes oil or grease spills. What can I do in the bathroom? Use night lights. Install grab bars by the toilet and in the tub and shower. Do not use towel bars as grab bars. Use non-skid mats or decals in the tub or shower. If you need to sit down in the shower, use a plastic, non-slip stool. Keep the floor dry. Clean up any water that spills on the floor as soon as it happens. Remove soap buildup in the tub or shower regularly. Attach bath mats securely with double-sided non-slip rug tape. Do not have throw rugs and other  things on the floor that can make you trip. What can I do in the bedroom? Use night lights. Make sure that you have a light by your bed that is easy to reach. Do not use any sheets or blankets that are too big for your bed. They should not hang down onto the floor. Have a firm chair that has side arms. You can use this for support while you get dressed. Do not have throw rugs and other things on the floor that can make you trip. What can I do in the kitchen? Clean up any spills right away. Avoid walking on wet floors. Keep items that you use a lot in easy-to-reach places. If you need to reach something above you, use a strong step stool that has a grab bar. Keep electrical cords out of the way. Do not use floor polish or wax that makes floors slippery. If you must use wax, use non-skid floor wax. Do not have throw rugs and other things on the floor that can make you trip. What can I do with my stairs? Do not leave any items on the stairs. Make sure that there are handrails on both sides of the stairs and use them. Fix handrails that are broken or loose. Make sure that handrails are as long as the stairways. Check any carpeting to make sure that it is firmly attached to the stairs. Fix any carpet that is loose or worn. Avoid having  throw rugs at the top or bottom of the stairs. If you do have throw rugs, attach them to the floor with carpet tape. Make sure that you have a light switch at the top of the stairs and the bottom of the stairs. If you do not have them, ask someone to add them for you. What else can I do to help prevent falls? Wear shoes that: Do not have high heels. Have rubber bottoms. Are comfortable and fit you well. Are closed at the toe. Do not wear sandals. If you use a stepladder: Make sure that it is fully opened. Do not climb a closed stepladder. Make sure that both sides of the stepladder are locked into place. Ask someone to hold it for you, if possible. Clearly  mark and make sure that you can see: Any grab bars or handrails. First and last steps. Where the edge of each step is. Use tools that help you move around (mobility aids) if they are needed. These include: Canes. Walkers. Scooters. Crutches. Turn on the lights when you go into a dark area. Replace any light bulbs as soon as they burn out. Set up your furniture so you have a clear path. Avoid moving your furniture around. If any of your floors are uneven, fix them. If there are any pets around you, be aware of where they are. Review your medicines with your doctor. Some medicines can make you feel dizzy. This can increase your chance of falling. Ask your doctor what other things that you can do to help prevent falls. This information is not intended to replace advice given to you by your health care provider. Make sure you discuss any questions you have with your health care provider. Document Released: 08/10/2009 Document Revised: 03/21/2016 Document Reviewed: 11/18/2014 Elsevier Interactive Patient Education  2017 [employer].

## 2021-08-07 NOTE — Progress Notes (Addendum)
Subjective:   Steven Munoz is a 80 y.o. male who presents for Medicare Annual/Subsequent preventive examination.  I connected with [place] today by telephone and verified that I am speaking with the correct person using two identifiers. Location patient: home Location provider: work Persons participating in the virtual visit: patient, Marine scientist.    I discussed the limitations, risks, security and privacy concerns of performing an evaluation and management service by telephone and the availability of in person appointments. I also discussed with the patient that there may be a patient responsible charge related to this service. The patient expressed understanding and verbally consented to this telephonic visit.    Interactive audio and video telecommunications were attempted between this provider and patient, however failed, due to patient having technical difficulties OR patient did not have access to video capability.  We continued and completed visit with audio only.  Some vital signs may be absent or patient reported.   Time Spent with patient on telephone encounter: 40 minutes   Review of Systems     Cardiac Risk Factors include: advanced age (>75men, >92 women);male gender;hypertension;dyslipidemia;obesity (BMI >30kg/m2)     Objective:    Today's Vitals   08/07/21 1023  Weight: 281 lb (127.5 kg)  Height: 5\' 11"  (1.803 m)   Body mass index is 39.19 kg/m.  Advanced Directives 08/07/2021 09/20/2016 09/19/2016 05/09/2015 12/21/2014  Does Patient Have a Medical Advance Directive? Yes Yes Yes Yes Yes  Type of Paramedic of [place];Living will Living will;Healthcare Power of Attorney Living will;Healthcare Power of [place];Living will  Does patient want to make changes to medical advance directive? - - - Yes - information given No - Patient declined  Copy of [place] in Chart? No - copy requested No -  copy requested No - copy requested - No - copy requested    Current Medications (verified) Outpatient Encounter Medications as of 08/07/2021  Medication Sig   allopurinol (ZYLOPRIM) 100 MG tablet Take 1 tablet (100 mg total) by mouth daily.   aspirin EC 81 MG tablet Take 81 mg by mouth daily.   Coenzyme Q10 (COQ10) 200 MG CAPS Take by mouth.   colchicine 0.6 MG tablet Take 1 tablet (0.6 mg total) by mouth 2 (two) times daily as needed.   COVID-19 mRNA bivalent vaccine, Pfizer, injection Inject into the muscle.   diphenoxylate-atropine (LOMOTIL) 2.5-0.025 MG tablet Take by mouth as needed for diarrhea or loose stools.   famotidine (PEPCID) 40 MG tablet Take 40 mg by mouth at bedtime.   gabapentin (NEURONTIN) 100 MG capsule Take 1 capsule (100 mg total) by mouth 3 (three) times daily.   hydrochlorothiazide (HYDRODIURIL) 25 MG tablet Take 1 tablet (25 mg total) by mouth daily.   hydrocortisone 2.5 % cream Apply 1 application topically as directed.   levothyroxine (SYNTHROID) 125 MCG tablet TAKE 1 TABLET BY MOUTH DAILY BEFORE BREAKFAST.   losartan (COZAAR) 100 MG tablet Take 1 tablet (100 mg total) by mouth daily.   Magnesium 400 MG CAPS Take 400 mg by mouth daily.   metoprolol tartrate (LOPRESSOR) 25 MG tablet TAKE 1/2 TABLET (12.5 MG   TOTAL) DAILY *MYLAN MFR*   Multiple Vitamins-Minerals (CENTRUM SILVER PO) Take 1 tablet by mouth daily.   Multiple Vitamins-Minerals (PRESERVISION AREDS 2+MULTI VIT) CAPS Take 1 capsule by mouth 2 (two) times daily.    Omega-3 Fatty Acids (OMEGA 3 500 PO) Take 1,000 mg by mouth daily. Has 1000mg   capsules   potassium chloride (KLOR-CON) 10 MEQ tablet Take 1 tablet (10 mEq total) by mouth daily.   rosuvastatin (CRESTOR) 20 MG tablet Take 1 tablet (20 mg total) by mouth daily.   sodium chloride (OCEAN) 0.65 % nasal spray Saline Nasal 0.65 % spray aerosol  Take by nasal route.   No facility-administered encounter medications on file as of 08/07/2021.     Allergies (verified) Povidone-iodine, Ofloxacin, and Tape   History: Past Medical History:  Diagnosis Date   CAD (coronary artery disease) CABG  05/29/09    OPERATIVE PROCEDURES:  Median sternotomy, extracorporeal circulation,    Colonic polyp 2002, 2005, 2009   [name]   Dermatophytosis of nail    Diverticulosis    Dysmetabolic syndrome    GERD (gastroesophageal reflux disease)    S/P dilation  2005   Gout    Hyperlipidemia    Hypertension    Hypothyroidism    Murmur    Nephrolithiasis    X 1   Peripheral neuropathy 03/20/2021   Pneumonia age 75   treated as inpatient   Sleep apnea    on CPAP; Dr [name]   Past Surgical History:  Procedure Laterality Date   athroscopy     right knee X2, left knee X1   COLONOSCOPY  2002 & 2009 &2014   polypectomy; [name]   CORONARY ARTERY BYPASS GRAFT  2010   Median sternotomy, extracorporeal circulation, coronary bypass graft surgery x3 using a left internal mammary artery graft to left anterior descending coronary artery, with a sequential  saphenous vein graft to posterior descending and posterolateral branches  of the right coronary artery.  Endoscopic vein harvesting from the right   EYE SURGERY Right    tear duct   Nail Alvusion Bilateral    Hallux   NOSE SURGERY  11/20/12   L max antrectomy;septoplasty;turbinate reduction. Dr Rheney, [place]     right 2003   REPLACEMENT TOTAL KNEE     partial Left March 2009   TENDON RELEASE Right 03/2016   from R hip. @ [place]  02/22/2014   right hip; Dr [facility]   Family History  Problem Relation Age of Onset   Hypertension Mother    Transient ischemic attack Mother    Lung cancer Mother        liver cancer, smoker, TIA   Liver cancer Mother    Heart attack Father 38   Heart attack Brother 40       smoker   Diabetes Neg Hx    Colon cancer Neg Hx    Prostate cancer Neg Hx    Social History   Socioeconomic History    Marital status: Married    Spouse name: [name]   Number of children: 2   Years of education: Not on file   Highest education level: Not on file  Occupational History   Occupation: retired- Chiropodist: [employer]  Tobacco Use   Smoking status: Former    Packs/day: 1.00    Years: 6.00    Pack years: 6.00    Types: Cigarettes    Quit date: 10/28/1966    Years since quitting: 54.8   Smokeless tobacco: Never   Tobacco comments:    smoked 1962- 1968, up to < 1 ppd  Vaping Use   Vaping Use: Never used  Substance and Sexual Activity   Alcohol use: Yes  Alcohol/week: 3.0 standard drinks    Types: 3 Glasses of wine per week    Comment: socially   Drug use: No   Sexual activity: Not on file  Other Topics Concern   Not on file  Social History Narrative   Socially; lives with spouse   Right Handed   Drinks no caffeine   Social Determinants of Health   Financial Resource Strain: Low Risk    Difficulty of Paying Living Expenses: Not hard at all  Food Insecurity: No Food Insecurity   Worried About Charity fundraiser in the Last Year: Never true   Arboriculturist in the Last Year: Never true  Transportation Needs: No Transportation Needs   Lack of Transportation (Medical): No   Lack of Transportation (Non-Medical): No  Physical Activity: Sufficiently Active   Days of Exercise per Week: 3 days   Minutes of Exercise per Session: 90 min  Stress: No Stress Concern Present   Feeling of Stress : Not at all  Social Connections: Socially Integrated   Frequency of Communication with Friends and Family: More than three times a week   Frequency of Social Gatherings with Friends and Family: More than three times a week   Attends Religious Services: More than 4 times per year   Active Member of [employer] or Organizations: Yes   Attends Music therapist: More than 4 times per year   Marital Status: Married    Tobacco Counseling Counseling given: Not  Answered Tobacco comments: smoked [place], up to < 1 ppd   Clinical Intake:  Pre-visit preparation completed: Yes  Pain : No/denies pain     BMI - recorded: 39.19 Nutritional Status: BMI > 30  Obese Nutritional Risks: None Diabetes: No  How often do you need to have someone help you when you read instructions, pamphlets, or other written materials from your doctor or pharmacy?: 1 - Never  Diabetic?No  Interpreter Needed?: No  Information entered by :: Caroleen Hamman LPN   Activities of Daily Living In your present state of health, do you have any difficulty performing the following activities: 08/07/2021 01/30/2021  Hearing? N N  Vision? N N  Difficulty concentrating or making decisions? N N  Walking or climbing stairs? N N  Dressing or bathing? N N  Doing errands, shopping? N N  Preparing Food and eating ? N -  Using the Toilet? N -  In the past six months, have you accidently leaked urine? N -  Do you have problems with loss of bowel control? N -  Managing your Medications? N -  Managing your Finances? N -  Housekeeping or managing your Housekeeping? N -  Some recent data might be hidden    Patient Care Team: Colon Branch, MD as PCP - General (Internal Medicine) Josue Hector, MD as PCP - Cardiology (Cardiology) Hallows, Verlee Monte, MD as Consulting Physician (Orthopedic Surgery) Rigoberto Noel, MD as Consulting Physician (Pulmonary Disease) Richmond Campbell, MD as Consulting Physician (Gastroenterology) Shon Hough, MD as Consulting Physician (Ophthalmology) Francee Gentile, [place] as Referring Physician (Chiropractic Medicine) Cherre Robins, PharmD (Pharmacist) Latanya Maudlin, MD as Consulting Physician (Orthopedic Surgery)  Indicate any recent Medical Services you may have received from other than [facility] providers in the past year (date may be approximate).     Assessment:   This is a routine wellness examination for Steven Munoz.  Hearing/Vision  screen Hearing Screening - Comments:: Bilateral hearing aids Vision Screening - Comments:: Last eye  exam-2022  Dietary issues and exercise activities discussed: Current Exercise Habits: Home exercise routine, Type of exercise: strength training/weights;treadmill, Time (Minutes): 60, Frequency (Times/Week): 3, Weekly Exercise (Minutes/Week): 180, Intensity: Moderate, Exercise limited by: None identified   Goals Addressed             This Visit's Progress    COMPLETED: Consider going back to the gym if there is appropriate social distancing   On track    Patient Stated       Lose some weight       Depression Screen PHQ 2/9 Scores 08/07/2021 04/19/2021 01/01/2021 08/25/2020 07/06/2019 06/22/2018 06/17/2017  PHQ - 2 Score 0 0 0 0 0 0 0    Fall Risk Fall Risk  08/07/2021 04/19/2021 01/01/2021 07/06/2019 06/22/2018  Falls in the past year? 1 1 0 0 No  Number falls in past yr: 0 0 - - -  Injury with Fall? 0 1 - - -  Risk for fall due to : History of fall(s) - - - -  Follow up Falls prevention discussed Falls evaluation completed - Falls evaluation completed -    FALL RISK PREVENTION PERTAINING TO THE HOME:  Any stairs in or around the home? Yes  If so, are there any without handrails? No  Home free of loose throw rugs in walkways, pet beds, electrical cords, etc? Yes  Adequate lighting in your home to reduce risk of falls? Yes   ASSISTIVE DEVICES UTILIZED TO PREVENT FALLS:  Life alert? No  Use of a cane, walker or w/c? No  Grab bars in the bathroom? Yes  Shower chair or bench in shower? No  Elevated toilet seat or a handicapped toilet? No   TIMED UP AND GO:  Was the test performed? No . Phone visit   Cognitive Function:Normal cognitive status assessed by this Nurse Health Advisor. No abnormalities found.          Immunizations Immunization History  Administered Date(s) Administered   Fluad Quad(high Dose 65+) 07/06/2019   Influenza Split 08/21/2011, 08/26/2012   Influenza  Whole 07/29/2008, 08/10/2009, 08/28/2010   Influenza, High Dose Seasonal PF 08/13/2016, 08/12/2017, 08/26/2018, 07/04/2020, 07/24/2021   Influenza,inj,Quad PF,6+ Mos 08/31/2013, 08/16/2014, 07/25/2015   PFIZER Comirnaty(Gray Top)Covid-19 Tri-Sucrose Vaccine 03/22/2021   PFIZER(Purple Top)SARS-COV-2 Vaccination 11/10/2019, 11/30/2019, 08/26/2020   Pfizer Covid-19 Vaccine Bivalent Booster 76yrs & up 07/20/2021   Pneumococcal Conjugate-13 05/09/2015   Pneumococcal Polysaccharide-23 05/27/2011   Td 05/18/2010   Tdap 07/04/2020   Zoster, Live 06/06/2016    TDAP status: Up to date  Flu Vaccine status: Up to date  Pneumococcal vaccine status: Up to date  Covid-19 vaccine status: Completed vaccines  Qualifies for Shingles Vaccine? Yes   Zostavax completed Yes   Shingrix Completed?: No.    Education has been provided regarding the importance of this vaccine. Patient has been advised to call insurance company to determine out of pocket expense if they have not yet received this vaccine. Advised may also receive vaccine at local pharmacy or Health Dept. Verbalized acceptance and understanding.  Screening Tests Health Maintenance  Topic Date Due   Zoster Vaccines- Shingrix (1 of 2) 10/03/2021 (Originally 11/29/1990)   COLONOSCOPY (Pts 45-25yrs Insurance coverage will need to be confirmed)  10/03/2024   TETANUS/TDAP  07/04/2030   INFLUENZA VACCINE  Completed   COVID-19 Vaccine  Completed   HPV VACCINES  Aged Out    Health Maintenance  There are no preventive care reminders to display for this patient.  Colorectal cancer  screening: Type of screening: Colonoscopy. Completed 10/04/2019. Repeat every 5 years  Lung Cancer Screening: (Low Dose CT Chest recommended if Age 70-80 years, 30 pack-year currently smoking OR have quit w/in 15years.) does not qualify.    Additional Screening:  Hepatitis C Screening: does not qualify  Vision Screening: Recommended annual ophthalmology exams for  early detection of glaucoma and other disorders of the eye. Is the patient up to date with their annual eye exam?  Yes  Who is the provider or what is the name of the office in which the patient attends annual eye exams? Patient unsure of name   Dental Screening: Recommended annual dental exams for proper oral hygiene  Community Resource Referral / Chronic Care Management: CRR required this visit?  No   CCM required this visit?  No      Plan:     I have personally reviewed and noted the following in the patient's chart:   Medical and social history Use of alcohol, tobacco or illicit drugs  Current medications and supplements including opioid prescriptions. Patient is not currently taking opioid prescriptions. Functional ability and status Nutritional status Physical activity Advanced directives List of other physicians Hospitalizations, surgeries, and ER visits in previous 12 months Vitals Screenings to include cognitive, depression, and falls Referrals and appointments  In addition, I have reviewed and discussed with patient certain preventive protocols, quality metrics, and best practice recommendations. A written personalized care plan for preventive services as well as general preventive health recommendations were provided to patient.   Due to this being a telephonic visit, the after visit summary with patients personalized plan was offered to patient via mail or my-chart. Patient would like to access on my-chart.   Marta Antu, LPN   [id number]  Nurse Health Advisor  Nurse Notes: None  I have reviewed and agree with [facility] Coaches documentation.  [name], MD

## 2021-08-10 ENCOUNTER — Encounter: Payer: Self-pay | Admitting: Medical

## 2021-08-10 ENCOUNTER — Other Ambulatory Visit: Payer: Self-pay | Admitting: Internal Medicine

## 2021-08-10 ENCOUNTER — Ambulatory Visit (INDEPENDENT_AMBULATORY_CARE_PROVIDER_SITE_OTHER): Payer: Medicare HMO | Admitting: Medical

## 2021-08-10 ENCOUNTER — Other Ambulatory Visit: Payer: Self-pay

## 2021-08-10 VITALS — BP 145/50 | HR 60 | Temp 98.1°F | Resp 18 | Ht 71.0 in | Wt 292.2 lb

## 2021-08-10 DIAGNOSIS — I10 Essential (primary) hypertension: Secondary | ICD-10-CM

## 2021-08-10 DIAGNOSIS — T7840XA Allergy, unspecified, initial encounter: Secondary | ICD-10-CM

## 2021-08-10 MED ORDER — PREDNISONE 10 MG (21) PO TBPK: ORAL_TABLET | ORAL | 0 refills | Status: DC

## 2021-08-10 MED ORDER — METHYLPREDNISOLONE ACETATE 40 MG/ML IJ SUSP
40.0000 mg | Freq: Once | INTRAMUSCULAR | Status: AC
  Administered 2021-08-10: 40 mg via INTRAMUSCULAR

## 2021-08-10 NOTE — Progress Notes (Signed)
Subjective:    Patient ID: Steven Munoz, male    DOB: 1940/11/12, 80 y.o.   MRN: [id number]  HPI   Rash started on Tuesday.  Pt in for bilateral rash on his forearms, upper chest, rt side abdomen and his thighs. Pt grabbed pile of brush of his neighbors day before rash started. Next day saw red rash on his rt arm. Then gradually spread.  On review his a1c is low.  In the past when would contact poison ivy would get much milder rash.    Review of Systems  Constitutional:  Negative for chills, fatigue and fever.  Respiratory:  Negative for cough, chest tightness, shortness of breath and wheezing.   Cardiovascular:  Negative for chest pain and palpitations.  Gastrointestinal:  Negative for abdominal pain.  Skin:  Positive for rash.  Neurological:  Negative for dizziness and numbness.  Hematological:  Negative for adenopathy. Does not bruise/bleed easily.  Psychiatric/Behavioral:  Negative for behavioral problems.     Past Medical History:  Diagnosis Date   CAD (coronary artery disease) CABG  05/29/09    OPERATIVE PROCEDURES:  Median sternotomy, extracorporeal circulation,    Colonic polyp 2002, 2005, 2009   [name]   Dermatophytosis of nail    Diverticulosis    Dysmetabolic syndrome    GERD (gastroesophageal reflux disease)    S/P dilation  2005   Gout    Hyperlipidemia    Hypertension    Hypothyroidism    Murmur    Nephrolithiasis    X 1   Peripheral neuropathy 03/20/2021   Pneumonia age 45   treated as inpatient   Sleep apnea    on CPAP; Dr [name]     Social History   Socioeconomic History   Marital status: Married    Spouse name: [name]   Number of children: 2   Years of education: Not on file   Highest education level: Not on file  Occupational History   Occupation: retired- Chiropodist: [employer]  Tobacco Use   Smoking status: Former    Packs/day: 1.00    Years: 6.00    Pack years: 6.00    Types: Cigarettes    Quit  date: 10/28/1966    Years since quitting: 54.8   Smokeless tobacco: Never   Tobacco comments:    smoked 1962- 1968, up to < 1 ppd  Vaping Use   Vaping Use: Never used  Substance and Sexual Activity   Alcohol use: Yes    Alcohol/week: 3.0 standard drinks    Types: 3 Glasses of wine per week    Comment: socially   Drug use: No   Sexual activity: Not on file  Other Topics Concern   Not on file  Social History Narrative   Socially; lives with spouse   Right Handed   Drinks no caffeine   Social Determinants of Health   Financial Resource Strain: Low Risk    Difficulty of Paying Living Expenses: Not hard at all  Food Insecurity: No Food Insecurity   Worried About Charity fundraiser in the Last Year: Never true   Arboriculturist in the Last Year: Never true  Transportation Needs: No Transportation Needs   Lack of Transportation (Medical): No   Lack of Transportation (Non-Medical): No  Physical Activity: Sufficiently Active   Days of Exercise per Week: 3 days   Minutes of Exercise per Session: 90 min  Stress: No Stress  Concern Present   Feeling of Stress : Not at all  Social Connections: Socially Integrated   Frequency of Communication with Friends and Family: More than three times a week   Frequency of Social Gatherings with Friends and Family: More than three times a week   Attends Religious Services: More than 4 times per year   Active Member of [employer] or Organizations: Yes   Attends Music therapist: More than 4 times per year   Marital Status: Married  Human resources officer Violence: Not At Risk   Fear of Current or Ex-Partner: No   Emotionally Abused: No   Physically Abused: No   Sexually Abused: No    Past Surgical History:  Procedure Laterality Date   athroscopy     right knee X2, left knee X1   COLONOSCOPY  2002 & 2009 &2014   polypectomy; [name]   CORONARY ARTERY BYPASS GRAFT  2010   Median sternotomy, extracorporeal circulation, coronary bypass  graft surgery x3 using a left internal mammary artery graft to left anterior descending coronary artery, with a sequential  saphenous vein graft to posterior descending and posterolateral branches  of the right coronary artery.  Endoscopic vein harvesting from the right   EYE SURGERY Right    tear duct   Nail Alvusion Bilateral    Hallux   NOSE SURGERY  11/20/12   L max antrectomy;septoplasty;turbinate reduction. Dr Rheney, [place]     right 2003   REPLACEMENT TOTAL KNEE     partial Left March 2009   TENDON RELEASE Right 03/2016   from R hip. @ [place]  02/22/2014   right hip; Dr [facility]    Family History  Problem Relation Age of Onset   Hypertension Mother    Transient ischemic attack Mother    Lung cancer Mother        liver cancer, smoker, TIA   Liver cancer Mother    Heart attack Father 61   Heart attack Brother 50       smoker   Diabetes Neg Hx    Colon cancer Neg Hx    Prostate cancer Neg Hx     Allergies  Allergen Reactions   Povidone-Iodine     REACTION: rash locally   Ofloxacin     Affected sleep with eye drop formulation   Tape Rash    Cloth adhesive tape-per patient Cloth adhesive tape-per patient Cloth adhesive tape-per patient    Current Outpatient Medications on File Prior to Visit  Medication Sig Dispense Refill   allopurinol (ZYLOPRIM) 100 MG tablet Take 1 tablet (100 mg total) by mouth daily. 90 tablet 1   aspirin EC 81 MG tablet Take 81 mg by mouth daily.     Coenzyme Q10 (COQ10) 200 MG CAPS Take by mouth.     colchicine 0.6 MG tablet Take 1 tablet (0.6 mg total) by mouth 2 (two) times daily as needed. 180 tablet 0   COVID-19 mRNA bivalent vaccine, Pfizer, injection Inject into the muscle. 0.3 mL 0   diphenoxylate-atropine (LOMOTIL) 2.5-0.025 MG tablet Take by mouth as needed for diarrhea or loose stools.     famotidine (PEPCID) 40 MG tablet Take 40 mg by mouth at bedtime.     gabapentin  (NEURONTIN) 100 MG capsule Take 1 capsule (100 mg total) by mouth 3 (three) times daily. 90 capsule 3   hydrochlorothiazide (HYDRODIURIL) 25 MG tablet TAKE 1 TABLET DAILY 90 tablet 1  hydrocortisone 2.5 % cream Apply 1 application topically as directed.     levothyroxine (SYNTHROID) 125 MCG tablet TAKE 1 TABLET BY MOUTH DAILY BEFORE BREAKFAST. 90 tablet 0   losartan (COZAAR) 100 MG tablet TAKE 1 TABLET DAILY 90 tablet 1   Magnesium 400 MG CAPS Take 400 mg by mouth daily.     metoprolol tartrate (LOPRESSOR) 25 MG tablet TAKE 1/2 TABLET (12.5 MG   TOTAL) DAILY *MYLAN MFR* 45 tablet 1   Multiple Vitamins-Minerals (CENTRUM SILVER PO) Take 1 tablet by mouth daily.     Multiple Vitamins-Minerals (PRESERVISION AREDS 2+MULTI VIT) CAPS Take 1 capsule by mouth 2 (two) times daily.      Omega-3 Fatty Acids (OMEGA 3 500 PO) Take 1,000 mg by mouth daily. Has 1000mg  capsules     potassium chloride (KLOR-CON) 10 MEQ tablet Take 1 tablet (10 mEq total) by mouth daily. 90 tablet 1   rosuvastatin (CRESTOR) 20 MG tablet Take 1 tablet (20 mg total) by mouth daily. 90 tablet 1   sodium chloride (OCEAN) 0.65 % nasal spray Saline Nasal 0.65 % spray aerosol  Take by nasal route.     No current facility-administered medications on file prior to visit.    BP (!) 145/50   Pulse 60   Temp 98.1 F (36.7 C)   Resp 18   Ht 5\' 11"  (1.803 m)   Wt 292 lb 3.2 oz (132.5 kg)   SpO2 94%   BMI 40.75 kg/m       Objective:   Physical Exam  General- No acute distress. Pleasant patient. Neck- Full range of motion, no jvd Lungs- Clear, even and unlabored. Heart- regular rate and rhythm. Neurologic- CNII- XII grossly intact.  Derm- extensive papular reash on both medial forearm and some medial bicep. On palpatoin of these areas no induration, no flutuance. Does not appear to have skin infection.     Assessment & Plan:   Patient Instructions  You recently have probable  allergic reaction to poison ivy. We gave you  depo-medrol  40 im injection. I am also prescribing oral prednisone and benadryl for itching. Your rash should gradually improve. If worsening or expanding please notify us.  If any area becomes warm to touch and very tender indicating skin infection let us know.  Follow up in 7-10 days or as needed.    Mackie Pai, PA-C

## 2021-08-10 NOTE — Patient Instructions (Addendum)
You recently have probable  allergic reaction to poison ivy. We gave you depo-medrol  40 im injection. I am also prescribing oral prednisone and  can use over the counter benadryl for itching. Your rash should gradually improve. If worsening or expanding please notify us.  If any area becomes warm to touch and very tender indicating skin infection let us know.  Follow up in 7-10 days or as needed.

## 2021-08-27 NOTE — Progress Notes (Signed)
Patient ID: Steven Munoz, male   DOB: 1941-09-09, 80 y.o.   MRN: [id number]     80 y.o. CABG August 2010 He had ostial LAD, distal RCA/PDA disease and had LIMA to LAD SVG Sequential to PDA and PLB EF normal circumflex not grafted. Dr [name] follow him for OSA and CPAP Activity limited by right hip pain post surgery at [facility] and f/u tendon release. Dr Clent Jacks.  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  All other systems reviewed and negative  General: BP 132/80   Pulse 62   Ht 5\' 11"  (1.803 m)   Wt 288 lb 6.4 oz (130.8 kg)   SpO2 92%   BMI 40.22 kg/m  Affect appropriate Overweight white male  RLE varicosities HEENT: Tinnitus  Neck supple with no adenopathy JVP normal no bruits no thyromegaly Lungs clear with no wheezing and good diaphragmatic motion Heart:  S1/S2 SEM  murmur, no rub, gallop or click PMI normal Abdomen: benighn, BS positve, no tenderness, no AAA no bruit.  No HSM or HJR Distal pulses intact with no bruits Peripheral neuropathy in feet  Skin warm and dry Right TKR with RLE varicosities and mild edema    Current Outpatient Medications  Medication Sig Dispense Refill   allopurinol (ZYLOPRIM) 100 MG tablet Take 1 tablet (100 mg total) by mouth daily.  90 tablet 1   aspirin EC 81 MG tablet Take 81 mg by mouth daily.     Coenzyme Q10 (COQ10) 200 MG CAPS Take by mouth.     colchicine 0.6 MG tablet Take 1 tablet (0.6 mg total) by mouth 2 (two) times daily as needed. 180 tablet 0   COVID-19 mRNA bivalent vaccine, Pfizer, injection Inject into the muscle. 0.3 mL 0   diphenoxylate-atropine (LOMOTIL) 2.5-0.025 MG tablet Take by mouth as needed for diarrhea or loose stools.     famotidine (PEPCID) 40 MG tablet Take 40 mg by mouth at bedtime.     gabapentin (NEURONTIN) 100 MG capsule Take 1 capsule (100 mg total) by mouth 3 (three) times daily. 90 capsule 3   hydrochlorothiazide (HYDRODIURIL) 25 MG tablet TAKE 1 TABLET DAILY 90 tablet 1   hydrocortisone 2.5 % cream Apply 1 application topically as directed.     levothyroxine (SYNTHROID) 125 MCG tablet TAKE 1 TABLET BY MOUTH DAILY BEFORE BREAKFAST. 90 tablet 0   losartan (COZAAR) 100 MG tablet TAKE 1 TABLET DAILY 90 tablet 1   Magnesium 400 MG CAPS Take 400 mg by mouth daily.     metoprolol tartrate (LOPRESSOR) 25 MG tablet TAKE 1/2 TABLET (12.5 MG   TOTAL) DAILY *MYLAN MFR* 45 tablet 1   Multiple Vitamins-Minerals (CENTRUM SILVER PO) Take 1 tablet by mouth daily.     Multiple Vitamins-Minerals (PRESERVISION AREDS 2+MULTI  VIT) CAPS Take 1 capsule by mouth 2 (two) times daily.      Omega-3 Fatty Acids (OMEGA 3 500 PO) Take 1,000 mg by mouth daily. Has 1000mg  capsules     potassium chloride (KLOR-CON) 10 MEQ tablet Take 1 tablet (10 mEq total) by mouth daily. 90 tablet 1   rosuvastatin (CRESTOR) 20 MG tablet Take 1 tablet (20 mg total) by mouth daily. 90 tablet 1   sodium chloride (OCEAN) 0.65 % nasal spray Saline Nasal 0.65 % spray aerosol  Take by nasal route.     No current facility-administered medications for this visit.    Allergies  Povidone-iodine, Ofloxacin, and Tape  Electrocardiogram:  08/14/20 SR rate 53 normal 09/03/2021 NSR rate 62 PR 222 msec stable  Assessment and Plan  Murmur:   Reviewed echo from 12/08/17 EF 55-60% AV sclerosis trivial MR no significant valve Dx will update   CAD/CABG: 2010 lima to LAD and SVG to PDA/PLB no angina continue medical Rx   PAF: post op no recurrence ASA and beta blocker LA severely dilated on most echo  12/08/17 57 mm Puts him at increased risk of recurrence   HTN: Well controlled.  Continue current medications and low sodium Dash type diet.    Thyroid:  Lab Results  Component Value Date   TSH 2.42 07/04/2021    Cholesterol  Lab Results  Component Value Date   LDLCALC 57 07/04/2021    Ortho: post right hip surgery [name]  Post tendon release in June 2018  with relief of pain  First Degree: no change on ECG today no high grade AV block 220 msec  ENT:  F/u [name] bells palsy resolved still with tinnitus wearing hearing aids post tear duct surgery   Murmur:  AV sclerosis consider echo in a year   F/U in a year    [employer]

## 2021-09-03 ENCOUNTER — Ambulatory Visit: Payer: Medicare HMO | Admitting: Cardiovascular Disease

## 2021-09-03 ENCOUNTER — Other Ambulatory Visit: Payer: Self-pay

## 2021-09-03 ENCOUNTER — Encounter: Payer: Self-pay | Admitting: Cardiovascular Disease

## 2021-09-03 VITALS — BP 132/80 | HR 62 | Ht 71.0 in | Wt 288.4 lb

## 2021-09-03 DIAGNOSIS — I25810 Atherosclerosis of coronary artery bypass graft(s) without angina pectoris: Secondary | ICD-10-CM | POA: Diagnosis not present

## 2021-09-03 DIAGNOSIS — R011 Cardiac murmur, unspecified: Secondary | ICD-10-CM | POA: Diagnosis not present

## 2021-09-03 DIAGNOSIS — R9431 Abnormal electrocardiogram [ECG] [EKG]: Secondary | ICD-10-CM | POA: Diagnosis not present

## 2021-09-03 NOTE — Patient Instructions (Addendum)
Medication Instructions:  *If you need a refill on your cardiac medications before your next appointment, please call your pharmacy*  Lab Work: If you have labs (blood work) drawn today and your tests are completely normal, you will receive your results only by: [place] (if you have MyChart) OR A paper copy in the mail If you have any lab test that is abnormal or we need to change your treatment, we will call you to review the results.  Testing/Procedures: Your physician has requested that you have an echocardiogram in one year. Echocardiography is a painless test that uses sound waves to create images of your heart. It provides your doctor with information about the size and shape of your heart and how well your heart's chambers and valves are working. This procedure takes approximately one hour. There are no restrictions for this procedure.   Follow-Up: At [facility], you and your health needs are our priority.  As part of our continuing mission to provide you with exceptional heart care, we have created designated Provider Care Teams.  These Care Teams include your primary Cardiologist (physician) and Advanced Practice Providers (APPs -  Physician Assistants and Nurse Practitioners) who all work together to provide you with the care you need, when you need it.  We recommend signing up for the patient portal called "MyChart".  Sign up information is provided on this After Visit Summary.  MyChart is used to connect with patients for Virtual Visits (Telemedicine).  Patients are able to view lab/test results, encounter notes, upcoming appointments, etc.  Non-urgent messages can be sent to your provider as well.   To learn more about what you can do with MyChart, go to [email or web address].    Your next appointment:   12 month(s)  The format for your next appointment:   In Person  Provider:   Jenkins Rouge, MD

## 2021-09-17 ENCOUNTER — Telehealth: Payer: Medicare HMO

## 2021-09-20 ENCOUNTER — Other Ambulatory Visit: Payer: Self-pay | Admitting: Internal Medicine

## 2021-10-01 ENCOUNTER — Other Ambulatory Visit: Payer: Self-pay | Admitting: Internal Medicine

## 2021-10-31 ENCOUNTER — Encounter: Payer: Self-pay | Admitting: Internal Medicine

## 2021-10-31 ENCOUNTER — Other Ambulatory Visit: Payer: Self-pay

## 2021-10-31 ENCOUNTER — Ambulatory Visit: Payer: Medicare HMO | Admitting: Internal Medicine

## 2021-10-31 VITALS — BP 137/71 | HR 57 | Ht 72.0 in | Wt 291.6 lb

## 2021-10-31 DIAGNOSIS — Z8739 Personal history of other diseases of the musculoskeletal system and connective tissue: Secondary | ICD-10-CM | POA: Diagnosis not present

## 2021-10-31 DIAGNOSIS — M109 Gout, unspecified: Secondary | ICD-10-CM

## 2021-10-31 DIAGNOSIS — M1A0790 Idiopathic chronic gout, unspecified ankle and foot, without tophus (tophi): Secondary | ICD-10-CM

## 2021-10-31 DIAGNOSIS — Z87442 Personal history of urinary calculi: Secondary | ICD-10-CM

## 2021-10-31 DIAGNOSIS — M1A00X0 Idiopathic chronic gout, unspecified site, without tophus (tophi): Secondary | ICD-10-CM | POA: Insufficient documentation

## 2021-10-31 MED ORDER — ALLOPURINOL 100 MG PO TABS: 100.0000 mg | ORAL_TABLET | Freq: Every day | ORAL | 1 refills | Status: DC

## 2021-10-31 NOTE — Progress Notes (Signed)
Office Visit Note  Patient: Steven Munoz             Date of Birth: September 30, 1941           MRN: [id number]             PCP: Colon Branch, MD Referring: Colon Branch, MD Visit Date: 10/31/2021   Subjective:  Other (Patient report numbness in bilateral feet )   History of Present Illness: Steven Munoz is a 81 y.o. male here for follow up for gout on allopurinol 100 mg daily and colchicine PRN.  He denies any recurrence of severe joint pain or inflammation in the ankles since our visit last year.  He continues having some neuropathy and decreased sensation most severe in his right great toe.  He has some persistent swelling below the knees. Previously and increase in diuretics with his primary care provider partially improved this.  Previous HPI 05/02/21 [name] is a 81 y.o. male here for gout currently on allopurinol 100 mg daily and colchicine 0.6 mg PRN.  He takes HCTZ and losartan for hypertension and has history of CABG for CAD but without any chronic loop diuretic use currently.  He states his previous history of gout was at least about 10 years ago he experienced redness swelling and pain around the left great toe that lasted for a few days and improved with treatment of oral prednisone.  He did not have recurrent or chronic symptoms similar to this inflammation.  He has had some chronic osteoarthritis joint pains with bilateral pain with hand use and bony nodules he had right knee arthroplasty and has some left knee osteoarthritis and crepitus.  Earlier this year he developed pain and swelling with hot to the touch around the right ankle joint.  He took colchicine for this that improved his symptoms but when discontinuing the colchicine after 2 weeks he experienced recurrence of the pain and inflammation.  He went back on the medicine and after about another 3 weeks of treatment he discontinued this with no return of his symptoms.  He was also started on allopurinol 100 mg daily.   Around the beginning of this episode he experienced increased right-sided sciatica type pain extending from the low back to the right knee.  Due to the sciatica he saw neurology for evaluation work-up demonstrated that he also has peripheral neuropathy probably contributing to pain in his feet as well with recent NCS and serologic testing at [facility] for this.  The symptoms overall are doing well today so he has not started taking the prescribed gabapentin at all for neuropathy.   Labs reviewed 02/2021 Uric acid 6.1   Review of Systems  Constitutional:  Negative for fatigue.  HENT:  Negative for mouth sores, mouth dryness and nose dryness.   Eyes:  Negative for pain, itching and dryness.  Respiratory:  Negative for shortness of breath and difficulty breathing.   Cardiovascular:  Negative for chest pain and palpitations.  Gastrointestinal:  Negative for constipation and diarrhea.  Endocrine: Negative for increased urination.  Genitourinary:  Negative for difficulty urinating.  Musculoskeletal:  Positive for joint pain and joint pain. Negative for joint swelling, myalgias, morning stiffness, muscle tenderness and myalgias.  Skin:  Negative for color change, rash and redness.  Allergic/Immunologic: Negative for susceptible to infections.  Neurological:  Positive for numbness. Negative for dizziness, headaches, memory loss and weakness.  Hematological:  Negative for bruising/bleeding tendency.  Psychiatric/Behavioral:  Negative for confusion.  PMFS History:  Patient Active Problem List   Diagnosis Date Noted   Idiopathic chronic gout, unspecified site, without tophus (tophi) 10/31/2021   Peripheral neuropathy 03/20/2021   Annual physical exam 06/06/2016   PCP NOTES >>>>>>>>>>>> 02/13/2016   CAD (coronary artery disease) 02/13/2016   Pedal edema 01/25/2016   Obesity (BMI 30-39.9) 06/02/2014   CTS (carpal tunnel syndrome) 06/01/2014   Varicose veins 04/07/2014   Prolonged P-R interval  03/04/2013   NEPHROLITHIASIS, HX OF 05/18/2010   ATRIAL FIBRILLATION 06/20/2009   CORONARY ARTERY BYPASS GRAFT, HX OF 06/20/2009   PREMATURE VENTRICULAR CONTRACTIONS 05/09/2009   MURMUR 05/08/2009   Essential hypertension 04/10/2009   DIVERTICULOSIS, COLON 04/10/2009   G E R D 08/05/2008   OSA on CPAP 08/05/2008   DERMATOPHYTOSIS OF NAIL 07/29/2008   Nocturia 05/30/2008   Hypothyroidism 02/15/2008   Elevated lipids 02/15/2008   History of gout 24/40/1027   DYSMETABOLIC SYNDROME X 25/36/6440   History of colonic polyps 01/14/2008    Past Medical History:  Diagnosis Date   CAD (coronary artery disease) CABG  05/29/09    OPERATIVE PROCEDURES:  Median sternotomy, extracorporeal circulation,    Colonic polyp 2002, 2005, 2009   [name]   Dermatophytosis of nail    Diverticulosis    Dysmetabolic syndrome    GERD (gastroesophageal reflux disease)    S/P dilation  2005   Gout    Hyperlipidemia    Hypertension    Hypothyroidism    Murmur    Nephrolithiasis    X 1   Peripheral neuropathy 03/20/2021   Pneumonia age 62   treated as inpatient   Sleep apnea    on CPAP; Dr [name]    Family History  Problem Relation Age of Onset   Hypertension Mother    Transient ischemic attack Mother    Lung cancer Mother        liver cancer, smoker, TIA   Liver cancer Mother    Heart attack Father 51   Heart attack Brother 35       smoker   Diabetes Neg Hx    Colon cancer Neg Hx    Prostate cancer Neg Hx    Past Surgical History:  Procedure Laterality Date   athroscopy     right knee X2, left knee X1   COLONOSCOPY  2002 & 2009 &2014   polypectomy; [name]   CORONARY ARTERY BYPASS GRAFT  2010   Median sternotomy, extracorporeal circulation, coronary bypass graft surgery x3 using a left internal mammary artery graft to left anterior descending coronary artery, with a sequential  saphenous vein graft to posterior descending and posterolateral branches  of the right coronary artery.   Endoscopic vein harvesting from the right   EYE SURGERY Right    tear duct   Nail Alvusion Bilateral    Hallux   NOSE SURGERY  11/20/12   L max antrectomy;septoplasty;turbinate reduction. Dr Rheney, [place]     right 2003   REPLACEMENT TOTAL KNEE     partial Left March 2009   TENDON RELEASE Right 03/2016   from R hip. @ [place]  02/22/2014   right hip; Dr [facility]   Social History   Social History Narrative   Socially; lives with spouse   Right Handed   Drinks no caffeine   Immunization History  Administered Date(s) Administered   Fluad Quad(high Dose 65+) 07/06/2019   Influenza Split 08/21/2011, 08/26/2012   Influenza  Whole 07/29/2008, 08/10/2009, 08/28/2010   Influenza, High Dose Seasonal PF 08/13/2016, 08/12/2017, 08/26/2018, 07/04/2020, 07/24/2021   Influenza,inj,Quad PF,6+ Mos 08/31/2013, 08/16/2014, 07/25/2015   PFIZER Comirnaty(Gray Top)Covid-19 Tri-Sucrose Vaccine 03/22/2021   PFIZER(Purple Top)SARS-COV-2 Vaccination 11/10/2019, 11/30/2019, 08/26/2020   Pfizer Covid-19 Vaccine Bivalent Booster 42yr & up 07/20/2021   Pneumococcal Conjugate-13 05/09/2015   Pneumococcal Polysaccharide-23 05/27/2011   Td 05/18/2010   Tdap 07/04/2020   Zoster, Live 06/06/2016     Objective: Vital Signs: BP 137/71 (BP Location: Left Arm, Patient Position: Sitting, Cuff Size: Large)   Pulse (!) 57   Ht 6' (1.829 m)   Wt 291 lb 9.6 oz (132.3 kg)   BMI 39.55 kg/m    Physical Exam Constitutional:      Appearance: He is obese.  Cardiovascular:     Rate and Rhythm: Normal rate and regular rhythm.  Pulmonary:     Effort: Pulmonary effort is normal.     Breath sounds: Normal breath sounds.  Skin:    General: Skin is warm and dry.     Comments: Bilateral lower extremity edema below the knees 2+ pitting at the ankles mild chronic skin thickening and hyperpigmentation extending less than halfway up the shin  Neurological:      Mental Status: He is alert.  Psychiatric:        Mood and Affect: Mood normal.     Musculoskeletal Exam:  Elbows full ROM no tenderness or swelling Wrists full ROM no tenderness or swelling Fingers full ROM no tenderness or swelling Knees right side decreased flexion ROM, well healed surgical scars bilaterally Ankles no tenderness, overlying edema Very limited ROM of 1st MTPs bilaterally  Investigation: No additional findings.  Imaging: No results found.  Recent Labs: Lab Results  Component Value Date   WBC 6.4 02/26/2021   HGB 14.8 02/26/2021   PLT 181.0 02/26/2021   NA 141 05/02/2021   K 4.1 05/02/2021   CL 104 05/02/2021   CO2 29 05/02/2021   GLUCOSE 99 05/02/2021   BUN 15 05/02/2021   CREATININE 0.96 05/02/2021   BILITOT 0.7 02/26/2021   ALKPHOS 74 02/26/2021   AST 25 02/26/2021   ALT 30 02/26/2021   PROT 7.0 02/26/2021   ALBUMIN 4.5 02/26/2021   CALCIUM 9.6 05/02/2021   GFRAA  06/01/2009    >60        The eGFR has been calculated using the MDRD equation. This calculation has not been validated in all clinical situations. eGFR's persistently <60 mL/min signify possible Chronic Kidney Disease.    Speciality Comments: No specialty comments available.  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  Uric acid has been well controlled on allopurinol 100 mg daily although was never very high.  Current swelling appears more consistent with pedal edema no current complications.  He has upcoming labs planned  with primary care provider next week assuming not severe change would continue low dose prophylaxis no changes or close monitoring needed.  Orders: No orders of the defined types were placed in this encounter.  Meds ordered this encounter  Medications  allopurinol (ZYLOPRIM) 100 MG tablet    Sig: Take 1 tablet (100 mg total) by mouth daily.    Dispense:  90 tablet    Refill:  1     Follow-Up Instructions: Return if symptoms worsen or fail to improve.   Collier Salina, MD  Note - This record has been created using [employer].  Chart creation errors have been sought, but may not always  have been located. Such creation errors do not reflect on  the standard of medical care.

## 2021-11-02 ENCOUNTER — Telehealth: Payer: Self-pay | Admitting: *Deleted

## 2021-11-02 NOTE — Chronic Care Management (AMB) (Signed)
°  Care Management   Note  11/02/2021 Name: Steven Munoz MRN: [id number] DOB: 1941-07-18  Steven Munoz is a 81 y.o. year old male who is a primary care patient of Colon Branch, MD and is actively engaged with the care management team. I reached out to Delia Chimes by phone today to assist with re-scheduling a follow up visit with the Pharmacist  Follow up plan: Unsuccessful telephone outreach attempt made. A HIPAA compliant phone message was left for the patient providing contact information and requesting a return call.   Steven Munoz, [facility]  Direct Dial: [phone]

## 2021-11-03 ENCOUNTER — Other Ambulatory Visit: Payer: Self-pay | Admitting: Internal Medicine

## 2021-11-05 ENCOUNTER — Encounter: Payer: Self-pay | Admitting: Internal Medicine

## 2021-11-05 ENCOUNTER — Ambulatory Visit (INDEPENDENT_AMBULATORY_CARE_PROVIDER_SITE_OTHER): Payer: Medicare HMO | Admitting: Internal Medicine

## 2021-11-05 VITALS — BP 126/68 | HR 53 | Temp 98.1°F | Resp 18 | Ht 71.0 in | Wt 291.2 lb

## 2021-11-05 DIAGNOSIS — I10 Essential (primary) hypertension: Secondary | ICD-10-CM | POA: Diagnosis not present

## 2021-11-05 DIAGNOSIS — E039 Hypothyroidism, unspecified: Secondary | ICD-10-CM | POA: Diagnosis not present

## 2021-11-05 DIAGNOSIS — Z8739 Personal history of other diseases of the musculoskeletal system and connective tissue: Secondary | ICD-10-CM

## 2021-11-05 DIAGNOSIS — Z23 Encounter for immunization: Secondary | ICD-10-CM

## 2021-11-05 LAB — BASIC METABOLIC PANEL
BUN: 19 mg/dL (ref 6–23)
CO2: 28 mEq/L (ref 19–32)
Calcium: 9.3 mg/dL (ref 8.4–10.5)
Chloride: 103 mEq/L (ref 96–112)
Creatinine, Ser: 0.85 mg/dL (ref 0.40–1.50)
GFR: 81.82 mL/min (ref >60.00)
Glucose, Bld: 107 mg/dL — ABNORMAL HIGH (ref 70–99)
Potassium: 4 mEq/L (ref 3.5–5.1)
Sodium: 139 mEq/L (ref 135–145)

## 2021-11-05 LAB — URIC ACID: Uric Acid, Serum: 5.7 mg/dL (ref 4.0–7.8)

## 2021-11-05 LAB — TSH: TSH: 1.47 u[IU]/mL (ref 0.35–5.50)

## 2021-11-05 NOTE — Patient Instructions (Signed)
Check the  blood pressure regularly.  BP GOAL is between 110/65 and  135/85. If it is consistently higher or lower, let me know    GO TO THE LAB : Get the blood work     GO TO THE FRONT DESK, PLEASE SCHEDULE YOUR APPOINTMENTS Come back for a checkup in 6 months 

## 2021-11-05 NOTE — Assessment & Plan Note (Signed)
Polyneuropathy: Currently on gabapentin 100 mg tid, feels that is helping to some extent, declines to increase the dose. HTN: Ambulatory BPs excellent in the 120s.  Continue HCTZ, losartan, metoprolol, potassium.  Check BMP Hypothyroidism: On Synthroid, check TSH. CAD, murmur (AV sclerosis) Saw cardiology 09/03/2021, note reviewed, felt to be stable, next visit 1 year Vertigo: see previous entries, symptoms are essentially resolved. Gout: On allopurinol, check uric acid and share results with rheumatology. Preventive care: PNM 20 today. RTC 6 months

## 2021-11-05 NOTE — Progress Notes (Signed)
Subjective:    Patient ID: Steven Munoz, male    DOB: 01/14/1941, 81 y.o.   MRN: [id number]  DOS:  11/05/2021 Type of visit - description: rov  Since the last office visit is doing well. Today with talk about neuropathy, hypertension, thyroid disease, gout , vertigo.    Review of Systems Denies chest pain or difficulty breathing. No palpitations. No nausea vomiting no blood in the stools  Past Medical History:  Diagnosis Date   CAD (coronary artery disease) CABG  05/29/09    OPERATIVE PROCEDURES:  Median sternotomy, extracorporeal circulation,    Colonic polyp 2002, 2005, 2009   [name]   Dermatophytosis of nail    Diverticulosis    Dysmetabolic syndrome    GERD (gastroesophageal reflux disease)    S/P dilation  2005   Gout    Hyperlipidemia    Hypertension    Hypothyroidism    Murmur    Nephrolithiasis    X 1   Peripheral neuropathy 03/20/2021   Pneumonia age 51   treated as inpatient   Sleep apnea    on CPAP; Dr [name]    Past Surgical History:  Procedure Laterality Date   athroscopy     right knee X2, left knee X1   COLONOSCOPY  2002 & 2009 &2014   polypectomy; [name]   CORONARY ARTERY BYPASS GRAFT  2010   Median sternotomy, extracorporeal circulation, coronary bypass graft surgery x3 using a left internal mammary artery graft to left anterior descending coronary artery, with a sequential  saphenous vein graft to posterior descending and posterolateral branches  of the right coronary artery.  Endoscopic vein harvesting from the right   EYE SURGERY Right    tear duct   Nail Alvusion Bilateral    Hallux   NOSE SURGERY  11/20/12   L max antrectomy;septoplasty;turbinate reduction. Dr Rheney, [place]     right 2003   REPLACEMENT TOTAL KNEE     partial Left March 2009   TENDON RELEASE Right 03/2016   from R hip. @ [place]  02/22/2014   right hip; Dr [facility] [address]    Current Outpatient Medications   Medication Instructions   allopurinol (ZYLOPRIM) 100 mg, Oral, Daily   aspirin EC 81 mg, Oral, Daily   Coenzyme Q10 (COQ10) 200 MG CAPS Oral   colchicine 0.6 mg, Oral, 2 times daily PRN   diphenoxylate-atropine (LOMOTIL) 2.5-0.025 MG tablet Oral, As needed   famotidine (PEPCID) 40 mg, Oral, Daily at bedtime   gabapentin (NEURONTIN) 100 MG capsule TAKE 1 CAPSULE (100 MG TOTAL) BY MOUTH THREE TIMES DAILY.   hydrochlorothiazide (HYDRODIURIL) 25 MG tablet TAKE 1 TABLET DAILY   hydrocortisone 2.5 % cream 1 application, Topical, As needed   levothyroxine (SYNTHROID) 125 MCG tablet TAKE 1 TABLET BY MOUTH EVERY DAY BEFORE BREAKFAST   losartan (COZAAR) 100 MG tablet TAKE 1 TABLET DAILY   Magnesium 400 mg, Oral, Daily   metoprolol tartrate (LOPRESSOR) 25 MG tablet TAKE 1/2 TABLET (12.5 MG   TOTAL) DAILY *MYLAN MFR*   Multiple Vitamins-Minerals (CENTRUM SILVER PO) 1 tablet, Daily   Multiple Vitamins-Minerals (PRESERVISION AREDS 2+MULTI VIT) CAPS 1 capsule, Oral, 2 times daily   Omega-3 Fatty Acids (OMEGA 3 500 PO) 1,000 mg, Oral, Daily, Has 1000mg  capsules   potassium chloride (KLOR-CON) 10 MEQ tablet 10 mEq, Oral, Daily   rosuvastatin (CRESTOR) 20 MG tablet TAKE 1 TABLET DAILY   sodium chloride (OCEAN) 0.65 %  nasal spray Saline Nasal 0.65 % spray aerosol  Take by nasal route.       Objective:   Physical Exam BP 126/68 (BP Location: Left Arm, Patient Position: Sitting, Cuff Size: Normal)    Pulse (!) 53    Temp 98.1 F (36.7 C) (Oral)    Resp 18    Ht 5\' 11"  (1.803 m)    Wt 291 lb 4 oz (132.1 kg)    SpO2 97%    BMI 40.62 kg/m  General:   Well developed, NAD, BMI noted. HEENT:  Normocephalic . Face symmetric, atraumatic Lungs:  CTA B Normal respiratory effort, no intercostal retractions, no accessory muscle use. Heart: RRR, soft systolic murmur.  Lower extremities: no pretibial edema bilaterally  Skin: Not pale. Not jaundice Neurologic:  alert & oriented X3.  Speech normal, gait  appropriate for age and unassisted Psych--  Cognition and judgment appear intact.  Cooperative with normal attention span and concentration.  Behavior appropriate. No anxious or depressed appearing.      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  No clear evidence of radiculopathy. 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 HTN: Ambulatory BPs excellent in the 120s.  Continue HCTZ, losartan, metoprolol, potassium.  Check BMP Hypothyroidism: On Synthroid, check TSH. CAD, murmur (AV sclerosis) Saw cardiology 09/03/2021, note reviewed, felt to be stable, next visit 1 year Vertigo: see previous entries, symptoms are essentially resolved. Gout: On allopurinol, check uric acid and share results with rheumatology. Preventive care: PNM 20 today. RTC 6 months   This visit occurred during the SARS-CoV-2 public health emergency.  Safety protocols were in place, including screening questions prior to the visit, additional usage of staff PPE, and extensive cleaning of exam room while observing appropriate contact time as indicated for disinfecting solutions.

## 2021-11-05 NOTE — Chronic Care Management (AMB) (Signed)
°  Care Management   Note  11/05/2021 Name: Steven Munoz MRN: [id number] DOB: 1940-10-30  Steven Munoz is a 81 y.o. year old male who is a primary care patient of Colon Branch, MD and is actively engaged with the care management team. I reached out to Delia Chimes by phone today to assist with re-scheduling a follow up visit with the Pharmacist  Follow up plan: Telephone appointment with care management team member scheduled for: 11/20/2021  Julian Hy, [place], [place] Management  Direct Dial: [phone]

## 2021-11-07 NOTE — Progress Notes (Signed)
Agree, uric acid is 5.7 which is less than 6 so at goal. He can continue the current allopurinol dose 100 mg daily. We can schedule to follow up in 1 year or as needed.Steven Munoz

## 2021-11-20 ENCOUNTER — Ambulatory Visit (INDEPENDENT_AMBULATORY_CARE_PROVIDER_SITE_OTHER): Payer: Medicare HMO | Admitting: Pharmacist

## 2021-11-20 DIAGNOSIS — I10 Essential (primary) hypertension: Secondary | ICD-10-CM

## 2021-11-20 DIAGNOSIS — G629 Polyneuropathy, unspecified: Secondary | ICD-10-CM

## 2021-11-20 DIAGNOSIS — E039 Hypothyroidism, unspecified: Secondary | ICD-10-CM

## 2021-11-20 DIAGNOSIS — M109 Gout, unspecified: Secondary | ICD-10-CM

## 2021-11-20 DIAGNOSIS — I25810 Atherosclerosis of coronary artery bypass graft(s) without angina pectoris: Secondary | ICD-10-CM

## 2021-11-20 DIAGNOSIS — R739 Hyperglycemia, unspecified: Secondary | ICD-10-CM

## 2021-11-20 NOTE — Patient Instructions (Addendum)
Steven Munoz It was a pleasure speaking with you today.  I have attached a summary of our visit today and information about your health goals.   Please call the care guide team at [phone] if you need to cancel or reschedule your appointment.   If you have any questions or concerns, please feel free to contact me either at the phone number below or with a MyChart message.   Keep up the good work!  Cherre Robins, PharmD Clinical Pharmacist [facility] [phone] (direct line)  [phone] (main office number)  CARE PLAN ENTRY    Hypertension BP Readings from Last 3 Encounters:  11/05/21 126/68  10/31/21 137/71  09/03/21 132/80   Pharmacist Clinical Goal(s): Over the next 180 days, patient will work with PharmD and providers to maintain BP goal <130/80 Current regimen:  Losartan 100mg  daily Hydrochlorithiazide 25mg  daily Metoprolol tartrate 25mg  - take 0.5 tablet once a day Interventions: Discussed blood pressure goal Patient self care activities - Over the next 180 days, patient will: Maintain hypertension medication regimen.  Check blood pressure 2 or 3 times per week, record and bring to future appointments.   Hyperlipidemia Lab Results  Component Value Date/Time   LDLCALC 57 07/04/2021 09:27 AM   LDLCALC 64 07/04/2020 09:47 AM   Pharmacist Clinical Goal(s): Over the next 180 days, patient will work with PharmD and providers to maintain LDL goal < 70 Current regimen:  Rosuvastatin 20mg  daily Interventions: Discussed importance of adherence Patient self care activities - Over the next 180 days, patient will: Maintain cholesterol medication regimen.   Gout:  Pharmacist Clinical Goal(s): Over the next 180 days, patient will work with PharmD and providers to decreased gout exacerbations Current regimen:  Colchicine 0.6mg  up to twice a day as needed for gout Allopurinol 100mg  daily Patient self care activities - Over the next  180 days, patient will: Avoid foods that can increase uric acid.  Continue to take allopurinol daily as preventative Continue to use colchicine as needed for gout flares.   Hypothyroidism Pharmacist Clinical Goal(s) Over the next 90 days, patient will work with PharmD and providers to maintain TSH within normal limits Current regimen:  Levothyroxine 123mcg daily Patient self care activities - Over the next 90 days, patient will: Maintain hypothyroidism medication regimen Separate dose of levothyroxine and vitamins by at least 2 hours  Pre-Diabetes Lab Results  Component Value Date/Time   HGBA1C 5.7 02/26/2021 04:19 PM   HGBA1C 5.5 07/04/2020 09:47 AM   Pharmacist Clinical Goal(s): Over the next 180 days, patient will work with PharmD and providers to maintain A1c goal <5.7% Current regimen:  Diet and exercise management   Patient self care activities - Over the next 180 days, patient will: Maintain a1c <5.7% Continue to limit intake of sugar Restart exercise regularly - goal is to get at least 150 minutes of physical activity per week  Neuropathy / nerve pain:  Pharmacist Clinical Goal(s): Over the next 180 days, patient will work with PharmD and providers to manage nerve pain Current regimen:  Gabapentin 100mg  up to 3 times a day  Patient self care activities - Over the next 180 days, patient will: Continue to take gabapentin 100mg  up to 3 times a day if needed for nerve related pain   Medication management Pharmacist Clinical Goal(s): Over the next 180 days, patient will work with PharmD and providers to maintain optimal medication adherence [facility] [address] Interventions Comprehensive  medication review performed. Continue current medication management strategy Patient self care activities - Over the next 180 days, patient will: Focus on medication adherence by filling and taking medications appropriately  Take medications as  prescribed Report any questions or concerns to PharmD and/or provider(s)   Patient verbalizes understanding of instructions and care plan provided today and agrees to view in [place]. Active MyChart status confirmed with patient.

## 2021-11-20 NOTE — Chronic Care Management (AMB) (Signed)
Chronic Care Management Pharmacy Note  11/20/2021 Name:  Steven Munoz MRN:  [id number] DOB:  [birth year]  Subjective: Steven Munoz is an 81 y.o. year old male who is a primary patient of Paz, Alda Berthold, MD.  The CCM team was consulted for assistance with disease management and care coordination needs.    Engaged with patient by telephone for follow up visit in response to provider referral for pharmacy case management and/or care coordination services.   Consent to Services:  The patient was given information about Chronic Care Management services, agreed to services, and gave verbal consent prior to initiation of services.  Please see initial visit note for detailed documentation.   Patient Care Team: Colon Branch, MD as PCP - General (Internal Medicine) Josue Hector, MD as PCP - Cardiology (Cardiology) Hallows, Verlee Monte, MD as Consulting Physician (Orthopedic Surgery) Rigoberto Noel, MD as Consulting Physician (Pulmonary Disease) Richmond Campbell, MD as Consulting Physician (Gastroenterology) Shon Hough, MD as Consulting Physician (Ophthalmology) Francee Gentile, [place] as Referring Physician (Chiropractic Medicine) Cherre Robins, RPH-CPP (Pharmacist) Latanya Maudlin, MD as Consulting Physician (Orthopedic Surgery)  Recent office visits: 11/05/2021 - Int Med (Dr Larose Kells) Follow up. BP GOAL is between 110/65 and  135/85. No medication changes.  08/10/2021 - Fam Med ([place], [place]) Seen for bilateral rash. Suspect allergic reaction, contact dermitis to poison ivy. Received Depo Medrol injection 21m in office nd Prescribed prednisone.  Also recommended OTC Benedryl for itching.  05/09/2021 - PCP (Dr PLarose Kells dizziness; recommmended PT for vestibular rehab;    Recent consult visits: 10/31/2021 - Rheumatology (Dr RBenjamine Mola F/U gout and nephrolithiasis. No med changes.  09/03/2021 - cardio (Dr NJohnsie Cancel F/U CAD. No Med changes.  03/08/2021 - Neuro (Dr WJannifer Franklin f/u sciatica; Ordered nerve  conduction study an d EMG of right leg. No med changes.    Hospital visits: 02/28/2021 - [facility] [place] [facility] with scalp hematoma. No LOC; No med changes.   Objective:  Lab Results  Component Value Date   CREATININE 0.85 11/05/2021   CREATININE 0.96 05/02/2021   CREATININE 0.89 02/26/2021    Lab Results  Component Value Date   HGBA1C 5.7 02/26/2021   Last diabetic Eye exam: No results found for: HMDIABEYEEXA  Last diabetic Foot exam: No results found for: HMDIABFOOTEX      Component Value Date/Time   CHOL 129 07/04/2021 0927   TRIG 120.0 07/04/2021 0927   HDL 48.40 07/04/2021 0927   CHOLHDL 3 07/04/2021 0927   VLDL 24.0 07/04/2021 0927   LDLCALC 57 07/04/2021 0927   LDLCALC 64 07/04/2020 0947    Hepatic Function Latest Ref Rng & Units 02/26/2021 07/04/2020 07/06/2019  Total Protein 6.0 - 8.3 g/dL 7.0 6.9 6.9  Albumin 3.5 - 5.2 g/dL 4.5 - 4.3  AST 0 - 37 U/L _0 ALT 0 - 53 U/L _1 Alk Phosphatase 39 - 117 U/L 74 - 63  Total Bilirubin 0.2 - 1.2 mg/dL 0.7 1.3(H) 1.2  Bilirubin, Direct 0.0 - 0.3 mg/dL - - -    Lab Results  Component Value Date/Time   TSH 1.47 11/05/2021 09:21 AM   TSH 2.42 07/04/2021 09:27 AM    CBC Latest Ref Rng & Units 02/26/2021 07/04/2020 07/06/2019  WBC 4.0 - 10.5 K/uL 6.4 5.5 5.4  Hemoglobin 13.0 - 17.0 g/dL 14.8 15.1 14.6  Hematocrit 39.0 - 52.0 % 43.1 44.7 42.2  Platelets 150.0 - 400.0 K/uL 181.0 174 178.0  No results found for: VD25OH  Clinical ASCVD: Yes  The ASCVD Risk score (Arnett DK, et al., 2019) failed to calculate for the following reasons:   The 2019 ASCVD risk score is only valid for ages 63 to 37    Other: CHADS2VASc = 4  Social History   Tobacco Use  Smoking Status Former   Packs/day: 1.00   Years: 6.00   Pack years: 6.00   Types: Cigarettes   Quit date: 10/28/1966   Years since quitting: 55.1  Smokeless Tobacco Never  Tobacco Comments   smoked 1962- 1968, up to < 1 ppd   BP Readings from Last 3  Encounters:  11/05/21 126/68  10/31/21 137/71  09/03/21 132/80   Pulse Readings from Last 3 Encounters:  11/05/21 (!) 53  10/31/21 (!) 57  09/03/21 62   Wt Readings from Last 3 Encounters:  11/05/21 291 lb 4 oz (132.1 kg)  10/31/21 291 lb 9.6 oz (132.3 kg)  09/03/21 288 lb 6.4 oz (130.8 kg)    Assessment: Review of patient past medical history, allergies, medications, health status, including review of consultants reports, laboratory and other test data, was performed as part of comprehensive evaluation and provision of chronic care management services.   SDOH:  (Social Determinants of Health) assessments and interventions performed:    CCM Care Plan  Allergies  Allergen Reactions   Povidone-Iodine     REACTION: rash locally   Ofloxacin     Affected sleep with eye drop formulation   Tape Rash    Cloth adhesive tape-per patient Cloth adhesive tape-per patient Cloth adhesive tape-per patient    Medications Reviewed Today     Reviewed by Cherre Robins, RPH-CPP (Pharmacist) on 11/20/21 at 1526  Med List Status: <None>   Medication Order Taking? Sig Documenting Provider Last Dose Status Informant  allopurinol (ZYLOPRIM) 100 MG tablet [id number] Yes Take 1 tablet (100 mg total) by mouth daily. Collier Salina, MD Taking Active   aspirin EC 81 MG tablet [id number] Yes Take 81 mg by mouth daily. [provider] Taking Active   Coenzyme Q10 (COQ10) 200 MG CAPS [id number] Yes Take 200 mg by mouth daily. [provider] Taking Active   colchicine 0.6 MG tablet [id number] Yes Take 1 tablet (0.6 mg total) by mouth 2 (two) times daily as needed. Colon Branch, MD Taking Active            Med Note (CANTER, [place] Nov 05, 2021  8:58 AM) PRN  diphenoxylate-atropine (LOMOTIL) 2.5-0.025 MG tablet [id number] Yes Take by mouth as needed for diarrhea or loose stools. [provider] Taking Active   famotidine (PEPCID) 40 MG tablet [id number] Yes Take 40 mg by  mouth at bedtime. [provider] Taking Active   gabapentin (NEURONTIN) 100 MG capsule [id number] Yes TAKE 1 CAPSULE (100 MG TOTAL) BY MOUTH THREE TIMES DAILY. Colon Branch, MD Taking Active   hydrochlorothiazide (HYDRODIURIL) 25 MG tablet [id number] Yes TAKE 1 TABLET DAILY Colon Branch, MD Taking Active   hydrocortisone 2.5 % cream [id number] Yes Apply 1 application topically as needed. [provider] Taking Active   levothyroxine (SYNTHROID) 125 MCG tablet [id number] Yes TAKE 1 TABLET BY MOUTH EVERY DAY BEFORE BREAKFAST Colon Branch, MD Taking Active   losartan (COZAAR) 100 MG tablet [id number] Yes TAKE 1 TABLET DAILY Colon Branch, MD Taking Active   Magnesium 400 MG CAPS [id number] Yes Take 400 mg by mouth daily. [provider]  Taking Active Self  metoprolol tartrate (LOPRESSOR) 25 MG tablet [id number] Yes TAKE 1/2 TABLET (12.5 MG   TOTAL) DAILY *MYLAN MFR* Colon Branch, MD Taking Active   Multiple Vitamins-Minerals (CENTRUM SILVER PO) [id number] Yes Take 1 tablet by mouth daily. [provider] Taking Active   Multiple Vitamins-Minerals (PRESERVISION AREDS 2+MULTI VIT) CAPS [id number] Yes Take 1 capsule by mouth 2 (two) times daily.  [provider] Taking Active   Omega-3 Fatty Acids (OMEGA 3 500 PO) [id number] Yes Take 1,000 mg by mouth daily. Has 1084m capsules [provider] Taking Active   potassium chloride (KLOR-CON) 10 MEQ tablet [id number] Take 1 tablet (10 mEq total) by mouth daily. PColon Branch MD Taking Active   rosuvastatin (CRESTOR) 20 MG tablet [id number] TAKE 1 TABLET DAILY PColon Branch MD Taking Active   sodium chloride (OCEAN) 0.65 % nasal spray [id number] Saline Nasal 0.65 % spray aerosol  Take by nasal route. [provider] Taking Active             Patient Active Problem List   Diagnosis Date Noted   Idiopathic chronic gout, unspecified site, without tophus (tophi) 10/31/2021   Peripheral neuropathy  03/20/2021   Annual physical exam 06/06/2016   PCP NOTES >>>>>>>>>>>> 02/13/2016   CAD (coronary artery disease) 02/13/2016   Pedal edema 01/25/2016   Obesity (BMI 30-39.9) 06/02/2014   CTS (carpal tunnel syndrome) 06/01/2014   Varicose veins 04/07/2014   Prolonged P-R interval 03/04/2013   NEPHROLITHIASIS, HX OF 05/18/2010   ATRIAL FIBRILLATION 06/20/2009   CORONARY ARTERY BYPASS GRAFT, HX OF 06/20/2009   PREMATURE VENTRICULAR CONTRACTIONS 05/09/2009   MURMUR 05/08/2009   Essential hypertension 04/10/2009   DIVERTICULOSIS, COLON 04/10/2009   G E R D 08/05/2008   OSA on CPAP 08/05/2008   DERMATOPHYTOSIS OF NAIL 07/29/2008   Nocturia 05/30/2008   Hypothyroidism 02/15/2008   Elevated lipids 02/15/2008   History of gout 054/56/2563  DYSMETABOLIC SYNDROME X 089/37/3428  History of colonic polyps 01/14/2008    Immunization History  Administered Date(s) Administered   Fluad Quad(high Dose 65+) 07/06/2019   Influenza Split 08/21/2011, 08/26/2012   Influenza Whole 07/29/2008, 08/10/2009, 08/28/2010   Influenza, High Dose Seasonal PF 08/13/2016, 08/12/2017, 08/26/2018, 07/04/2020, 07/24/2021   Influenza,inj,Quad PF,6+ Mos 08/31/2013, 08/16/2014, 07/25/2015   PFIZER Comirnaty(Gray Top)Covid-19 Tri-Sucrose Vaccine 03/22/2021   PFIZER(Purple Top)SARS-COV-2 Vaccination 11/10/2019, 11/30/2019, 08/26/2020   PNEUMOCOCCAL CONJUGATE-20 11/05/2021   Pfizer Covid-19 Vaccine Bivalent Booster 162yr& up 07/20/2021   Pneumococcal Conjugate-13 05/09/2015   Pneumococcal Polysaccharide-23 05/27/2011   Td 05/18/2010   Tdap 07/04/2020   Zoster, Live 06/06/2016    Conditions to be addressed/monitored: Atrial Fibrillation, CAD, HTN, HLD, and OSA; preDM; neuropathy; hypothyroidism  Care Plan : General Pharmacy (Adult)  Updates made by EcCherre RobinsRPH-CPP since 11/20/2021 12:00 AM     Problem: Chronic Disease Management support, education, and care coordination needs related to Gout, HTN, HLD,  CAD, Hypothyroidism, GERD   Priority: Medium  Onset Date: 02/22/2021  Note:   CARE PLAN ENTRY (see longitudinal plan of care for additional care plan information)  Current Barriers:  Chronic Disease Management support, education, and care coordination needs related to Gout, HTN, HLD, CAD, Hypothyroidism, GERD   Hypertension Controlled; BP goal <135/85 Home BP readings: 120 to 135 / 70 Home HR: 60's No recent dizziness; had vertigo about 6 months ago. Did physical therapy Current regimen:  Losartan 10070maily Hydrochlorothiazide 83m24mily Metoprolol tartrate 83mg11make  0.5 tablet once a day Interventions: Discussed blood pressure goal Check blood pressure 2 to 3 times per week.  Maintain hypertension medication regimen  Hyperlipidemia / CAD:  Controlled;  LDL goal < 70 Current regimen:  Rosuvastatin 84m daily Aspirin 851mdaily  Interventions: Discussed importance of adherence Maintain cholesterol medication regimen.   Gout:  Goal: decreased gout exacerbations Patient reports gout pain improving  Current regimen:  Allopurinol 1002maily Colchicine 0.6mg37m to twice a day as needed for gout Interventions: Continue allopurinol for gout prevention Continue to use colchicine is needed for acute gout episodes.   Hypothyroidism Goal: achieve TSH within normal limits Last TSH was WNL.  Current regimen:  Levothyroxine 125mc43mily Intervention:  Maintain hypothyroidism medication regimen Recommended he separate dose of levothyroxine from multivitamins and magnesium  Pre-Diabetes Lab Results  Component Value Date   HGBA1C 5.7 02/26/2021  At goal;  A1c goal <5.7% Diet: trying to limit red meat and carbohydrates; also limiting potion sizes Exercise: had stopped going to gym due to vertigo until yesterday. just restarted going to gym 3 times per week.  Current regimen:  Diet and exercise management   Interventions:  Continue current diet Discussed limiting  portion sizes especially when eating out.  Restart exercising regularly - goal is to get at least 150 minutes of physical activity per week  Neuropathy / nerve pain:  Goal: manage nerve pain Patient has not started gabapentin - he states pain is improving without medication and he was concerned about side effect of drowsiness.  Current regimen:  Gabapentin 100mg 19mo 3 times a day Intervention:   Continue gabapentin 100mg -46me up to 3 times a day for nerve pain  Medication management [facility] [address] Interventions Comprehensive medication review performed. Continue current medication management strategy Reviewed refill records and assessed adherence       Medication Assistance: None required.  Patient affirms current coverage meets needs.  [facility] [id number] [phone]  [id number] [id number] [place] [phone] Up:  Patient agrees to Care Plan and Follow-up.  Plan: Telephone follow up appointment with care management team member scheduled for:  6 months  Issaiah Seabrooks EcCherre Robins Clinical Pharmacist [facility]

## 2021-11-26 ENCOUNTER — Other Ambulatory Visit: Payer: Self-pay | Admitting: Internal Medicine

## 2021-11-27 DIAGNOSIS — E039 Hypothyroidism, unspecified: Secondary | ICD-10-CM

## 2021-11-27 DIAGNOSIS — E785 Hyperlipidemia, unspecified: Secondary | ICD-10-CM | POA: Diagnosis not present

## 2021-11-27 DIAGNOSIS — I10 Essential (primary) hypertension: Secondary | ICD-10-CM

## 2021-12-08 ENCOUNTER — Other Ambulatory Visit: Payer: Self-pay | Admitting: Internal Medicine

## 2021-12-25 DIAGNOSIS — H2513 Age-related nuclear cataract, bilateral: Secondary | ICD-10-CM | POA: Diagnosis not present

## 2021-12-25 DIAGNOSIS — H52203 Unspecified astigmatism, bilateral: Secondary | ICD-10-CM | POA: Diagnosis not present

## 2022-01-02 DIAGNOSIS — H25812 Combined forms of age-related cataract, left eye: Secondary | ICD-10-CM | POA: Diagnosis not present

## 2022-01-02 DIAGNOSIS — H2512 Age-related nuclear cataract, left eye: Secondary | ICD-10-CM | POA: Diagnosis not present

## 2022-01-23 DIAGNOSIS — H25811 Combined forms of age-related cataract, right eye: Secondary | ICD-10-CM | POA: Diagnosis not present

## 2022-01-23 DIAGNOSIS — H2511 Age-related nuclear cataract, right eye: Secondary | ICD-10-CM | POA: Diagnosis not present

## 2022-02-06 ENCOUNTER — Other Ambulatory Visit: Payer: Self-pay | Admitting: Internal Medicine

## 2022-02-06 DIAGNOSIS — I10 Essential (primary) hypertension: Secondary | ICD-10-CM

## 2022-03-20 ENCOUNTER — Other Ambulatory Visit: Payer: Self-pay | Admitting: Internal Medicine

## 2022-03-20 ENCOUNTER — Telehealth: Payer: Self-pay | Admitting: *Deleted

## 2022-03-20 NOTE — Telephone Encounter (Signed)
Called and spoke with patient regarding CPAP machine SD card, advised him that the SD card would be at the opposite end of the water chamber and that it would be labeled SD and to push on the card which is usually blue and it will pop out.  He said that we are always able to get the download and he never has to bring a card.  I advised him he has a machine from 2016 unless he has a new machine and it stopped transmitting April of 2021.  I let him know I would call [facility] and verify with him that he has an SD card in his machine and then call him back.  He verbalized understanding.  I called [facility] and verified his machine is from 2016 and it is no longer transmitting a signal and he would have to bring in the SD card and that all the [facility] machines have an SD card in them when they are sent out to the patients.  I let her know I would contact the patient and let him know.  ATC patient on his cell # that I reached him on earlier, no answer, no vm.  Called home number, no answer.  I left a detailed message regarding the SD card, where it is located on the machine and the he can either bring the card or the machine and we will get the card out of the machine.  Left # for any questions.

## 2022-03-21 ENCOUNTER — Ambulatory Visit: Payer: Medicare HMO | Admitting: Adult Health

## 2022-03-21 ENCOUNTER — Encounter: Payer: Self-pay | Admitting: Adult Health

## 2022-03-21 VITALS — BP 138/70 | HR 59 | Temp 97.8°F | Ht 73.0 in | Wt 295.2 lb

## 2022-03-21 DIAGNOSIS — E669 Obesity, unspecified: Secondary | ICD-10-CM | POA: Diagnosis not present

## 2022-03-21 DIAGNOSIS — Z9989 Dependence on other enabling machines and devices: Secondary | ICD-10-CM

## 2022-03-21 DIAGNOSIS — G4733 Obstructive sleep apnea (adult) (pediatric): Secondary | ICD-10-CM | POA: Diagnosis not present

## 2022-03-21 NOTE — Assessment & Plan Note (Signed)
Work on healthy weight loss 

## 2022-03-21 NOTE — Progress Notes (Signed)
$'@Patient'm$  ID: Steven Munoz, male    DOB: 03-16-1941, 81 y.o.   MRN: [id number]  Chief Complaint  Patient presents with   Follow-up    Referring provider: Colon Branch, MD  HPI: 81 year old male followed for obstructive sleep apnea Medical history significant for coronary disease status post CABG  TEST/EVENTS :  PSG (289 Lbs) 2009 -  severe OSA with predominant hypopneas, AHI 31/h corrected by CPAP +8 with large quattro mask . PLms  improved with titration  03/21/2022 Follow up ; OSA Patient returns for 1 year follow-up.  Has underlying severe obstructive sleep apnea.  He is maintained on nocturnal CPAP.  Patient says he wears his CPAP every single night never misses any nights.  Cannot sleep without his CPAP.  Patient says his CPAP machine is getting old and is starting to make noises.  Needs a new machine.  Patient says he gets in about 8 hours of sleep each night.  CPAP download shows 100% compliance.  Daily average usage at 7.5 to 8 hours each night.  Patient is on CPAP 8 cm H2O.  AHI 2.4/hour.  No leaks. Patient says he tries to stay active.  Is trying to work on weight loss.  He goes to the gym at least 2-3 times a week.    Allergies  Allergen Reactions   Povidone-Iodine     REACTION: rash locally   Ofloxacin     Affected sleep with eye drop formulation   Tape Rash    Cloth adhesive tape-per patient Cloth adhesive tape-per patient Cloth adhesive tape-per patient    Immunization History  Administered Date(s) Administered   Fluad Quad(high Dose 65+) 07/06/2019   Influenza Split 08/21/2011, 08/26/2012   Influenza Whole 07/29/2008, 08/10/2009, 08/28/2010   Influenza, High Dose Seasonal PF 08/13/2016, 08/12/2017, 08/26/2018, 07/04/2020, 07/24/2021   Influenza,inj,Quad PF,6+ Mos 08/31/2013, 08/16/2014, 07/25/2015   PFIZER Comirnaty(Gray Top)Covid-19 Tri-Sucrose Vaccine 03/22/2021   PFIZER(Purple Top)SARS-COV-2 Vaccination 11/10/2019, 11/30/2019, 08/26/2020   PNEUMOCOCCAL  CONJUGATE-20 11/05/2021   Pfizer Covid-19 Vaccine Bivalent Booster 41yr & up 07/20/2021   Pneumococcal Conjugate-13 05/09/2015   Pneumococcal Polysaccharide-23 05/27/2011   Td 05/18/2010   Tdap 07/04/2020   Zoster, Live 06/06/2016    Past Medical History:  Diagnosis Date   CAD (coronary artery disease) CABG  05/29/09    OPERATIVE PROCEDURES:  Median sternotomy, extracorporeal circulation,    Colonic polyp 2002, 2005, 2009   Dr MEarlean Shawl  Dermatophytosis of nail    Diverticulosis    Dysmetabolic syndrome    GERD (gastroesophageal reflux disease)    S/P dilation  2005   Gout    Hyperlipidemia    Hypertension    Hypothyroidism    Murmur    Nephrolithiasis    X 1   Peripheral neuropathy 03/20/2021   Pneumonia age 81  treated as inpatient   Sleep apnea    on CPAP; Dr AElsworth Soho   Tobacco History: Social History   Tobacco Use  Smoking Status Former   Packs/day: 1.00   Years: 6.00   Pack years: 6.00   Types: Cigarettes   Quit date: 10/28/1966   Years since quitting: 55.4  Smokeless Tobacco Never  Tobacco Comments   smoked 1962- 1968, up to < 1 ppd   Counseling given: Not Answered Tobacco comments: smoked 1[place] up to < 1 ppd   Outpatient Medications Prior to Visit  Medication Sig Dispense Refill   allopurinol (ZYLOPRIM) 100 MG tablet Take 1 tablet (100 mg total)  by mouth daily. 90 tablet 1   aspirin EC 81 MG tablet Take 81 mg by mouth daily.     Coenzyme Q10 (COQ10) 200 MG CAPS Take 200 mg by mouth daily.     colchicine 0.6 MG tablet Take 1 tablet (0.6 mg total) by mouth 2 (two) times daily as needed. 180 tablet 0   diphenoxylate-atropine (LOMOTIL) 2.5-0.025 MG tablet Take by mouth as needed for diarrhea or loose stools.     famotidine (PEPCID) 40 MG tablet Take 40 mg by mouth at bedtime.     gabapentin (NEURONTIN) 100 MG capsule TAKE 1 CAPSULE (100 MG TOTAL) BY MOUTH THREE TIMES DAILY. 270 capsule 1   hydrochlorothiazide (HYDRODIURIL) 25 MG tablet TAKE 1 TABLET DAILY  90 tablet 1   hydrocortisone 2.5 % cream Apply 1 application topically as needed.     levothyroxine (SYNTHROID) 125 MCG tablet TAKE 1 TABLET BY MOUTH EVERY DAY BEFORE BREAKFAST 90 tablet 1   losartan (COZAAR) 100 MG tablet TAKE 1 TABLET DAILY 90 tablet 1   Magnesium 400 MG CAPS Take 400 mg by mouth daily.     metoprolol tartrate (LOPRESSOR) 25 MG tablet TAKE 1/2 TABLET (12.5 MG   TOTAL) DAILY *MYLAN MFR* 45 tablet 1   Multiple Vitamins-Minerals (CENTRUM SILVER PO) Take 1 tablet by mouth daily.     Multiple Vitamins-Minerals (PRESERVISION AREDS 2+MULTI VIT) CAPS Take 1 capsule by mouth 2 (two) times daily.      Omega-3 Fatty Acids (OMEGA 3 500 PO) Take 1,000 mg by mouth daily. Has '1000mg'$  capsules     potassium chloride (KLOR-CON) 10 MEQ tablet TAKE 1 TABLET BY MOUTH EVERY DAY 90 tablet 1   rosuvastatin (CRESTOR) 20 MG tablet TAKE 1 TABLET DAILY 90 tablet 1   sodium chloride (OCEAN) 0.65 % nasal spray Saline Nasal 0.65 % spray aerosol  Take by nasal route.     No facility-administered medications prior to visit.     Review of Systems:   Constitutional:   No  weight loss, night sweats,  Fevers, chills,  +fatigue, or  lassitude.  HEENT:   No headaches,  Difficulty swallowing,  Tooth/dental problems, or  Sore throat,                No sneezing, itching, ear ache, nasal congestion, post nasal drip,   CV:  No chest pain,  Orthopnea, PND, swelling in lower extremities, anasarca, dizziness, palpitations, syncope.   GI  No heartburn, indigestion, abdominal pain, nausea, vomiting, diarrhea, change in bowel habits, loss of appetite, bloody stools.   Resp: No shortness of breath with exertion or at rest.  No excess mucus, no productive cough,  No non-productive cough,  No coughing up of blood.  No change in color of mucus.  No wheezing.  No chest wall deformity  Skin: no rash or lesions.  GU: no dysuria, change in color of urine, no urgency or frequency.  No flank pain, no hematuria   MS:  No  joint pain or swelling.  No decreased range of motion.  No back pain.    Physical Exam  BP 138/70 (BP Location: Left Arm, Cuff Size: Normal)   Pulse (!) 59   Temp 97.8 F (36.6 C)   Ht '6\' 1"'$  (1.854 m)   Wt 295 lb 3.2 oz (133.9 kg)   SpO2 98%   BMI 38.95 kg/m   GEN: A/Ox3; pleasant , NAD, well nourished    HEENT:  [place]/AT, NOSE-clear, THROAT-clear, no lesions, no postnasal  drip or exudate noted.  Class II-III NP airway  NECK:  Supple w/ fair ROM; no JVD; normal carotid impulses w/o bruits; no thyromegaly or nodules palpated; no lymphadenopathy.    RESP  Clear  P & A; w/o, wheezes/ rales/ or rhonchi. no accessory muscle use, no dullness to percussion  CARD:  RRR, no m/r/g, no peripheral edema, pulses intact, no cyanosis or clubbing.  GI:   Soft & nt; nml bowel sounds; no organomegaly or masses detected.   Musco: Warm bil, no deformities or joint swelling noted.   Neuro: alert, no focal deficits noted.    Skin: Warm, no lesions or rashes    Lab Results:  CBC    BNP No results found for: BNP  ProBNP No results found for: PROBNP  Imaging: No results found.        View : No data to display.          No results found for: NITRICOXIDE      Assessment & Plan:   OSA on CPAP Severe obstructive sleep apnea excellent control and compliance on CPAP Order for new CPAP machine sent to DME same settings  Plan  Patient Instructions  Order for new CPAP machine .  Continue on CPAP At bedtime  .  Keep up good work .  Work on healthy weight .  Do not drive if sleepy .  Follow up with Dr. [name]  In 1 year and As needed         Obesity (BMI 30-39.9) Work on healthy weight loss     Rexene Edison, NP 03/21/2022

## 2022-03-21 NOTE — Patient Instructions (Signed)
Order for new CPAP machine .  Continue on CPAP At bedtime  .  Keep up good work .  Work on healthy weight .  Do not drive if sleepy .  Follow up with Dr. [name]  In 1 year and As needed

## 2022-03-21 NOTE — Assessment & Plan Note (Signed)
Severe obstructive sleep apnea excellent control and compliance on CPAP Order for new CPAP machine sent to DME same settings  Plan  Patient Instructions  Order for new CPAP machine .  Continue on CPAP At bedtime  .  Keep up good work .  Work on healthy weight .  Do not drive if sleepy .  Follow up with Dr. [name]  In 1 year and As needed

## 2022-03-30 ENCOUNTER — Other Ambulatory Visit: Payer: Self-pay | Admitting: Internal Medicine

## 2022-04-11 ENCOUNTER — Telehealth: Payer: Self-pay | Admitting: Adult Health

## 2022-04-12 NOTE — Telephone Encounter (Signed)
Called patient and he stated that his insurance is not covering [facility] and would like his cpap order to be sent to [facility].  Please advise

## 2022-04-15 NOTE — Telephone Encounter (Signed)
Order has been sent to [facility]

## 2022-04-17 DIAGNOSIS — K8681 Exocrine pancreatic insufficiency: Secondary | ICD-10-CM | POA: Insufficient documentation

## 2022-04-24 ENCOUNTER — Other Ambulatory Visit: Payer: Self-pay | Admitting: Internal Medicine

## 2022-04-24 DIAGNOSIS — Z8739 Personal history of other diseases of the musculoskeletal system and connective tissue: Secondary | ICD-10-CM

## 2022-04-24 DIAGNOSIS — M109 Gout, unspecified: Secondary | ICD-10-CM

## 2022-04-24 NOTE — Telephone Encounter (Signed)
Next Visit:  Return if symptoms worsen or fail to improve.    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      Dispense:  90 tablet      Refill:  1    Labs: 11/05/2021, , uric acid is 5.7 which is less than 6 so at goal. He can continue the current allopurinol dose 100 mg daily.  Okay to refill Allopurinol?

## 2022-05-03 ENCOUNTER — Other Ambulatory Visit: Payer: Self-pay | Admitting: Internal Medicine

## 2022-05-06 DIAGNOSIS — G4733 Obstructive sleep apnea (adult) (pediatric): Secondary | ICD-10-CM | POA: Diagnosis not present

## 2022-05-09 ENCOUNTER — Encounter: Payer: Self-pay | Admitting: Internal Medicine

## 2022-05-09 ENCOUNTER — Ambulatory Visit (INDEPENDENT_AMBULATORY_CARE_PROVIDER_SITE_OTHER): Payer: Medicare HMO | Admitting: Internal Medicine

## 2022-05-09 VITALS — BP 132/72 | HR 54 | Temp 97.8°F | Resp 18 | Ht 73.0 in | Wt 285.1 lb

## 2022-05-09 DIAGNOSIS — E039 Hypothyroidism, unspecified: Secondary | ICD-10-CM

## 2022-05-09 DIAGNOSIS — I10 Essential (primary) hypertension: Secondary | ICD-10-CM

## 2022-05-09 DIAGNOSIS — E785 Hyperlipidemia, unspecified: Secondary | ICD-10-CM | POA: Diagnosis not present

## 2022-05-09 DIAGNOSIS — G609 Hereditary and idiopathic neuropathy, unspecified: Secondary | ICD-10-CM

## 2022-05-09 DIAGNOSIS — R739 Hyperglycemia, unspecified: Secondary | ICD-10-CM | POA: Diagnosis not present

## 2022-05-09 LAB — CBC WITH DIFFERENTIAL/PLATELET
Basophils Absolute: 0 10*3/uL (ref 0.0–0.1)
Basophils Relative: 0.8 % (ref 0.0–3.0)
Eosinophils Absolute: 0.2 10*3/uL (ref 0.0–0.7)
Eosinophils Relative: 4.2 % (ref 0.0–5.0)
HCT: 44.5 % (ref 39.0–52.0)
Hemoglobin: 15 g/dL (ref 13.0–17.0)
Lymphocytes Relative: 34.5 % (ref 12.0–46.0)
Lymphs Abs: 1.7 10*3/uL (ref 0.7–4.0)
MCHC: 33.8 g/dL (ref 30.0–36.0)
MCV: 95.6 fl (ref 78.0–100.0)
Monocytes Absolute: 0.4 10*3/uL (ref 0.1–1.0)
Monocytes Relative: 8.7 % (ref 3.0–12.0)
Neutro Abs: 2.6 10*3/uL (ref 1.4–7.7)
Neutrophils Relative %: 51.8 % (ref 43.0–77.0)
Platelets: 150 10*3/uL (ref 150.0–400.0)
RBC: 4.65 Mil/uL (ref 4.22–5.81)
RDW: 13.5 % (ref 11.5–15.5)
WBC: 5 10*3/uL (ref 4.0–10.5)

## 2022-05-09 LAB — BASIC METABOLIC PANEL
BUN: 15 mg/dL (ref 6–23)
CO2: 28 mEq/L (ref 19–32)
Calcium: 9.6 mg/dL (ref 8.4–10.5)
Chloride: 104 mEq/L (ref 96–112)
Creatinine, Ser: 0.92 mg/dL (ref 0.40–1.50)
GFR: 78.05 mL/min (ref >60.00)
Glucose, Bld: 116 mg/dL — ABNORMAL HIGH (ref 70–99)
Potassium: 3.9 mEq/L (ref 3.5–5.1)
Sodium: 139 mEq/L (ref 135–145)

## 2022-05-09 LAB — HEMOGLOBIN A1C: Hgb A1c MFr Bld: 5.7 % (ref 4.6–6.5)

## 2022-05-09 LAB — TSH: TSH: 1.26 u[IU]/mL (ref 0.35–5.50)

## 2022-05-09 LAB — AST: AST: 36 U/L (ref 0–37)

## 2022-05-09 LAB — ALT: ALT: 24 U/L (ref 0–53)

## 2022-05-09 MED ORDER — SHINGRIX 50 MCG/0.5ML IM SUSR: 0.5000 mL | Freq: Once | INTRAMUSCULAR | 1 refills | Status: AC

## 2022-05-09 NOTE — Patient Instructions (Addendum)
Recommend to proceed with the following vaccines at your pharmacy:  Shingrix (shingles)- see printed prescription Covid booster (bivalent) Flu shot this fall  Please bring [place] a copy of your Healthcare Power of Attorney for you chart. You may drop this off or mail it to [place].    Check the  blood pressure regularly BP GOAL is between 110/65 and  135/85. If it is consistently higher or lower, let me know    GO TO THE LAB : Get the blood work     [address], [place] back for a physical exam in 4 to 5 months

## 2022-05-09 NOTE — Assessment & Plan Note (Signed)
Hyperglycemia: Check A1c Polyneuropathy: Symptoms are still present, continue gabapentin. HTN: BP is very good today, reports good ambulatory BPs, continue HCTZ, losartan, metoprolol, potassium.  Check a BMP High cholesterol: Well-controlled on Crestor, check LFTs Hypothyroidism: On Synthroid, check TSH, adjust meds if needed OSA: Saw pulmonary 03/21/2022, note reviewed,  new CPAP machine ordered repeat Irregular bowel habits: Saw GI 04/17/2022, prescribed Creon for pancreatic exocrine insufficiency.  Plans to start it soon. Vertigo: Again essentially resolved. Preventive care: Recommend COVID booster, flu shot this fall.  Also Shingrix but he is concerned because had a reaction to Zostavax.  I understand. RTC 4 to 5 months CPX

## 2022-05-09 NOTE — Progress Notes (Signed)
Subjective:    Patient ID: Steven Munoz, male    DOB: October 19, 1941, 81 y.o.   MRN: [id number]  DOS:  05/09/2022 Type of visit - description: Follow-up  Routine follow-up. Since the last visit saw GI, note reviewed. Neuropathy symptoms still there, at baseline. Medication compliance is very good. Denies chest pain or difficulty breathing, no lower extremity edema  Review of Systems See above   Past Medical History:  Diagnosis Date   CAD (coronary artery disease) CABG  05/29/09    OPERATIVE PROCEDURES:  Median sternotomy, extracorporeal circulation,    Colonic polyp 2002, 2005, 2009   [name]   Dermatophytosis of nail    Diverticulosis    Dysmetabolic syndrome    GERD (gastroesophageal reflux disease)    S/P dilation  2005   Gout    Hyperlipidemia    Hypertension    Hypothyroidism    Murmur    Nephrolithiasis    X 1   Peripheral neuropathy 03/20/2021   Pneumonia age 27   treated as inpatient   Sleep apnea    on CPAP; Dr [name]    Past Surgical History:  Procedure Laterality Date   athroscopy     right knee X2, left knee X1   COLONOSCOPY  2002 & 2009 &2014   polypectomy; [name]   CORONARY ARTERY BYPASS GRAFT  2010   Median sternotomy, extracorporeal circulation, coronary bypass graft surgery x3 using a left internal mammary artery graft to left anterior descending coronary artery, with a sequential  saphenous vein graft to posterior descending and posterolateral branches  of the right coronary artery.  Endoscopic vein harvesting from the right   EYE SURGERY Right    tear duct   Nail Alvusion Bilateral    Hallux   NOSE SURGERY  11/20/12   L max antrectomy;septoplasty;turbinate reduction. Dr Rheney, [place]     right 2003   REPLACEMENT TOTAL KNEE     partial Left March 2009   TENDON RELEASE Right 03/2016   from R hip. @ [place]  02/22/2014   right hip; Dr [facility]    Current Outpatient Medications   Medication Instructions   allopurinol (ZYLOPRIM) 100 MG tablet TAKE 1 TABLET BY MOUTH EVERY DAY   aspirin EC 81 mg, Oral, Daily   colchicine 0.6 mg, Oral, 2 times daily PRN   CoQ10 200 mg, Oral, Daily   CREON 36000-114000 units CPEP capsule 72,000 Units, 3 times daily   diphenoxylate-atropine (LOMOTIL) 2.5-0.025 MG tablet Oral, As needed   famotidine (PEPCID) 40 mg, Oral, Daily at bedtime   gabapentin (NEURONTIN) 100 MG capsule TAKE 1 CAPSULE (100 MG TOTAL) BY MOUTH 3 TIMES A DAY   hydrochlorothiazide (HYDRODIURIL) 25 MG tablet TAKE 1 TABLET DAILY   hydrocortisone 2.5 % cream 1 application , Topical, As needed   levothyroxine (SYNTHROID) 125 MCG tablet TAKE 1 TABLET BY MOUTH EVERY DAY BEFORE BREAKFAST   losartan (COZAAR) 100 MG tablet TAKE 1 TABLET DAILY   Magnesium 400 mg, Oral, Daily   metoprolol tartrate (LOPRESSOR) 25 MG tablet TAKE 1/2 TABLET (12.5 MG   TOTAL) DAILY *MYLAN MFR*   Multiple Vitamins-Minerals (CENTRUM SILVER PO) 1 tablet, Daily   Multiple Vitamins-Minerals (PRESERVISION AREDS 2+MULTI VIT) CAPS 1 capsule, Oral, 2 times daily   Omega-3 Fatty Acids (OMEGA 3 500 PO) 1,000 mg, Oral, Daily, Has '1000mg'$  capsules   potassium chloride (KLOR-CON) 10 MEQ tablet TAKE 1 TABLET BY MOUTH EVERY  DAY   rosuvastatin (CRESTOR) 20 MG tablet TAKE 1 TABLET DAILY   sodium chloride (OCEAN) 0.65 % nasal spray Saline Nasal 0.65 % spray aerosol  Take by nasal route.   Zoster Vaccine Adjuvanted [facility]) injection 0.5 mLs, Intramuscular,  Once       Objective:   Physical Exam BP 132/72   Pulse (!) 54   Temp 97.8 F (36.6 C) (Oral)   Resp 18   Ht '6\' 1"'$  (1.854 m)   Wt 285 lb 2 oz (129.3 kg)   SpO2 97%   BMI 37.62 kg/m  General:   Well developed, NAD, BMI noted. HEENT:  Normocephalic . Face symmetric, atraumatic Lungs:  CTA B Normal respiratory effort, no intercostal retractions, no accessory muscle use. Heart: RRR,  no murmur.  Lower extremities: no pretibial edema bilaterally   Skin: Not pale. Not jaundice Neurologic:  alert & oriented X3.  Speech normal, gait appropriate for age and unassisted Psych--  Cognition and judgment appear intact.  Cooperative with normal attention span and concentration.  Behavior appropriate. No anxious or depressed appearing.      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  No clear evidence of radiculopathy. 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 HTN: BP is very good today, reports good ambulatory BPs, continue HCTZ, losartan, metoprolol, potassium.  Check a BMP High cholesterol: Well-controlled on Crestor, check LFTs Hypothyroidism: On Synthroid, check TSH, adjust meds if needed OSA: Saw pulmonary 03/21/2022, note reviewed,  new CPAP machine ordered repeat Irregular bowel habits: Saw GI 04/17/2022, prescribed Creon for pancreatic exocrine insufficiency.  Plans to start it soon. Vertigo: Again essentially resolved. Preventive care: Recommend COVID booster, flu shot this fall.  Also Shingrix but he is concerned because had a reaction to Zostavax.  I understand. RTC 4 to 5 months CPX

## 2022-05-30 ENCOUNTER — Other Ambulatory Visit: Payer: Self-pay | Admitting: Internal Medicine

## 2022-06-01 ENCOUNTER — Other Ambulatory Visit: Payer: Self-pay | Admitting: Internal Medicine

## 2022-06-06 DIAGNOSIS — G4733 Obstructive sleep apnea (adult) (pediatric): Secondary | ICD-10-CM | POA: Diagnosis not present

## 2022-06-12 DIAGNOSIS — K8681 Exocrine pancreatic insufficiency: Secondary | ICD-10-CM | POA: Diagnosis not present

## 2022-06-12 DIAGNOSIS — R150 Incomplete defecation: Secondary | ICD-10-CM | POA: Diagnosis not present

## 2022-06-13 ENCOUNTER — Ambulatory Visit (INDEPENDENT_AMBULATORY_CARE_PROVIDER_SITE_OTHER): Payer: Medicare HMO | Admitting: Pharmacist

## 2022-06-13 DIAGNOSIS — K8681 Exocrine pancreatic insufficiency: Secondary | ICD-10-CM

## 2022-06-13 DIAGNOSIS — I10 Essential (primary) hypertension: Secondary | ICD-10-CM

## 2022-06-13 DIAGNOSIS — G629 Polyneuropathy, unspecified: Secondary | ICD-10-CM

## 2022-06-13 DIAGNOSIS — I25810 Atherosclerosis of coronary artery bypass graft(s) without angina pectoris: Secondary | ICD-10-CM

## 2022-06-13 DIAGNOSIS — E039 Hypothyroidism, unspecified: Secondary | ICD-10-CM

## 2022-06-13 DIAGNOSIS — M109 Gout, unspecified: Secondary | ICD-10-CM

## 2022-06-13 NOTE — Chronic Care Management (AMB) (Signed)
Chronic Care Management Pharmacy Note  06/13/2022 Name:  Steven Munoz MRN:  [id number] DOB:  [birth year]  Summary:  Hypertension- Controlled. Continue current therapy for hypertension.  Hyperlipidemia / CAD: LDL at goal. Continue rosuvastatin Neuropathy / nerve pain: Patient reports gabapentin has helped with nerve pain but still has some numbness in his feet. Taking gabapentin 135m 3 times a day. He has also started B12 supplement 20034m daily. Last B12 level was checked in 2010 but patient started supplement because he read it could help with numbness / neuropathy. Recommended he try to increase gabapentin by 1003mer day every 3 to 5 days as needed to see if improves numbness in feet. Monitor for drowsiness / changes in cognition. Continue B12 supplement and discussed benefits of B12 supplementation. Recommended check B12 with next lab (has PCP appointment 07/2022. 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 Patient is in Medicare coverage gap and cost about $500 per 30 days. Reviewed eligibility for medication assistance program for Creon, Pancreaze, Pertyze, Viokace and Zenpep - patient did not qualify due to either income restrictions or because program does not allow patient's with Part D to apply. Reviewed other pancreatic enzyme treatments and costs:  Creon - tier 3 on insurance - $47 / 30 days; cost in coverage gap $500 Zenpep - tier 4 on insurance - $100 / 30 days; cost in coverage gap $600 Pancreaze - not on insurance formulary; cash price is about $3.16 per capsules / $853 per month Pertzye - not on insurance formulary; cash price is about $4.05 per capsules / $1093 per month Viokace - not on insurance formulary; cast price is about $4.37 per tablet / $1180 per month  Medication management: Reviewed refill records and assessed adherence. Suggested trial off magnesium to see if diarrhea improves since magnesium can worsen  diarrhea. Check Serum magnesium with next labs   Subjective: Steven Munoz an [age]o. year old male who is a primary patient of Steven Munoz.  The CCM team was consulted for assistance with disease management and care coordination needs.    Engaged with patient by telephone for follow up visit in response to provider referral for pharmacy case management and/or care coordination services.   Consent to Services:  The patient was given information about Chronic Care Management services, agreed to services, and gave verbal consent prior to initiation of services.  Please see initial visit note for detailed documentation.   Patient Care Team: PazColon BranchD as PCP - General (Internal Medicine) Steven Munoz as PCP - Cardiology (Cardiology) Hallows, RheVerlee MonteD as Consulting Physician (Orthopedic Surgery) AlvRigoberto NoelD as Consulting Physician (Pulmonary Disease) MedRichmond CampbellD as Consulting Physician (Gastroenterology) StoShon HoughD as Consulting Physician (Ophthalmology) MayFrancee GentileC [facility] Referring Physician (Chiropractic Medicine) EckCherre RobinsPH-CPP (Pharmacist) GioLatanya MaudlinD as Consulting Physician (Orthopedic Surgery)   Objective:  Lab Results  Component Value Date   CREATININE 0.92 05/09/2022   CREATININE 0.85 11/05/2021   CREATININE 0.96 05/02/2021    Lab Results  Component Value Date   HGBA1C 5.7 05/09/2022   Last diabetic Eye exam: No results found for: "HMDIABEYEEXA"  Last diabetic Foot exam: No results found for: "HMDIABFOOTEX"      Component Value Date/Time   CHOL 129 07/04/2021 0927   TRIG 120.0 07/04/2021 0927   HDL 48.40 07/04/2021 0927   CHOLHDL 3 07/04/2021 0927   VLDL 24.0  07/04/2021 0927   LDLCALC 57 07/04/2021 0927   LDLCALC 64 07/04/2020 0947       Latest Ref Rng & Units 05/09/2022    9:15 AM 02/26/2021    4:19 PM 07/04/2020    9:47 AM  Hepatic Function  Total Protein 6.0 - 8.3 g/dL  7.0  6.9   Albumin  3.5 - 5.2 g/dL  4.5    AST 0 - 37 U/L 36  25  23   ALT 0 - 53 U/L '24  30  23   ' Alk Phosphatase 39 - 117 U/L  74    Total Bilirubin 0.2 - 1.2 mg/dL  0.7  1.3     Lab Results  Component Value Date/Time   TSH 1.26 05/09/2022 09:15 AM   TSH 1.47 11/05/2021 09:21 AM       Latest Ref Rng & Units 05/09/2022    9:15 AM 02/26/2021    4:19 PM 07/04/2020    9:47 AM  CBC  WBC 4.0 - 10.5 K/uL 5.0  6.4  5.5   Hemoglobin 13.0 - 17.0 g/dL 15.0  14.8  15.1   Hematocrit 39.0 - 52.0 % 44.5  43.1  44.7   Platelets 150.0 - 400.0 K/uL 150.0  181.0  174     No results found for: "VD25OH"  Clinical ASCVD: Yes  The ASCVD Risk score (Arnett DK, et al., 2019) failed to calculate for the following reasons:   The 2019 ASCVD risk score is only valid for ages 80 to 44    Other: CHADS2VASc = 4  Social History   Tobacco Use  Smoking Status Former   Packs/day: 1.00   Years: 6.00   Total pack years: 6.00   Types: Cigarettes   Quit date: 10/28/1966   Years since quitting: 55.6  Smokeless Tobacco Never  Tobacco Comments   smoked 1962- 1968, up to < 1 ppd   BP Readings from Last 3 Encounters:  05/09/22 132/72  03/21/22 138/70  11/05/21 126/68   Pulse Readings from Last 3 Encounters:  05/09/22 (!) 54  03/21/22 (!) 59  11/05/21 (!) 53   Wt Readings from Last 3 Encounters:  05/09/22 285 lb 2 oz (129.3 kg)  03/21/22 295 lb 3.2 oz (133.9 kg)  11/05/21 291 lb 4 oz (132.1 kg)    Assessment: Review of patient past medical history, allergies, medications, health status, including review of consultants reports, laboratory and other test data, was performed as part of comprehensive evaluation and provision of chronic care management services.   SDOH:  (Social Determinants of Health) assessments and interventions performed:  SDOH Interventions    Flowsheet Row Most Recent Value  SDOH Interventions   Physical Activity Interventions Intervention Not Indicated       CCM Care Plan  Allergies   Allergen Reactions   Povidone-Iodine     REACTION: rash locally   Ofloxacin     Affected sleep with eye drop formulation   Tape Rash    Cloth adhesive tape-per patient Cloth adhesive tape-per patient Cloth adhesive tape-per patient    Medications Reviewed Today     Reviewed by Cherre Robins, RPH-CPP (Pharmacist) on 06/13/22 at [place] List Status: <None>   Medication Order Taking? Sig Documenting Provider Last Dose Status Informant  allopurinol (ZYLOPRIM) 100 MG tablet [id number] Yes TAKE 1 TABLET BY MOUTH EVERY DAY Rice, Resa Miner, MD Taking Active   aspirin EC 81 MG tablet [id number] Yes Take 81 mg by mouth daily. [provider]  Taking Active   Coenzyme Q10 (COQ10) 200 MG CAPS [id number] Yes Take 200 mg by mouth daily. [provider] Taking Active   colchicine 0.6 MG tablet [id number] Yes Take 1 tablet (0.6 mg total) by mouth 2 (two) times daily as needed. Colon Branch, MD Taking Active            Med Note (CANTER, [place] Nov 05, 2021  8:58 AM) PRN  CREON 36000-114000 units CPEP capsule [id number] No Take 72,000 Units by mouth 3 (three) times daily.  Patient not taking: Reported on 05/09/2022   [provider] Not Taking Active            Med Note Antony Contras, Adria Dill Jun 13, 2022  3:54 PM) Stopped due to cost / trial of Colestid  diphenoxylate-atropine (LOMOTIL) 2.5-0.025 MG tablet [id number] No Take by mouth as needed for diarrhea or loose stools.  Patient not taking: Reported on 06/13/2022   [provider] Not Taking Active            Med Note (CANTER, Cheri Rous   Thu May 09, 2022  8:51 AM) PRN  famotidine (PEPCID) 40 MG tablet [id number] Yes Take 40 mg by mouth at bedtime. [provider] Taking Active   gabapentin (NEURONTIN) 100 MG capsule [id number] Yes TAKE 1 CAPSULE BY MOUTH THREE TIMES A DAY Colon Branch, MD Taking Active   hydrochlorothiazide (HYDRODIURIL) 25 MG tablet [id number] Yes TAKE 1 TABLET DAILY Colon Branch, MD  Taking Active   hydrocortisone 2.5 % cream [id number] Yes Apply 1 application topically as needed. [provider] Taking Active   levothyroxine (SYNTHROID) 125 MCG tablet [id number] Yes TAKE 1 TABLET BY MOUTH EVERY DAY BEFORE BREAKFAST Colon Branch, MD Taking Active   losartan (COZAAR) 100 MG tablet [id number] Yes TAKE 1 TABLET DAILY Colon Branch, MD Taking Active   Magnesium 400 MG CAPS [id number] Yes Take 400 mg by mouth daily. [provider] Taking Active Self  metoprolol tartrate (LOPRESSOR) 25 MG tablet [id number] Yes TAKE 1/2 TABLET DAILY      *MYLAN MFR* Colon Branch, MD Taking Active   Multiple Vitamins-Minerals (CENTRUM SILVER PO) [id number] Yes Take 1 tablet by mouth daily. [provider] Taking Active   Multiple Vitamins-Minerals (PRESERVISION AREDS 2+MULTI VIT) CAPS [id number] Yes Take 1 capsule by mouth 2 (two) times daily.  [provider] Taking Active   Omega-3 Fatty Acids (OMEGA 3 500 PO) [id number] Yes Take 1,000 mg by mouth daily. Has 1070m capsules [provider] Taking Active   potassium chloride (KLOR-CON) 10 MEQ tablet [id number] TAKE 1 TABLET BY MOUTH EVERY DAY PColon Branch MD Taking Active   rosuvastatin (CRESTOR) 20 MG tablet [id number] TAKE 1 TABLET DAILY PColon Branch MD Taking Active   sodium chloride (OCEAN) 0.65 % nasal spray [id number] Saline Nasal 0.65 % spray aerosol  Take by nasal route. 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   Prolonged P-R interval 03/04/2013   NEPHROLITHIASIS, HX OF 05/18/2010   ATRIAL FIBRILLATION 06/20/2009   CORONARY ARTERY BYPASS GRAFT, HX OF 06/20/2009   PREMATURE VENTRICULAR CONTRACTIONS 05/09/2009   MURMUR 05/08/2009   Essential hypertension 04/10/2009   DIVERTICULOSIS, COLON 04/10/2009   G E R D 08/05/2008   OSA on CPAP 08/05/2008   DERMATOPHYTOSIS OF NAIL 07/29/2008   Nocturia 05/30/2008   Hypothyroidism 02/15/2008   Elevated lipids 02/15/2008   History of gout 21/19/4174   DYSMETABOLIC SYNDROME X 06/10/4817   History of colonic polyps 01/14/2008    Immunization History  Administered Date(s) Administered   Fluad Quad(high Dose 65+) 07/06/2019   Influenza Split 08/21/2011, 08/26/2012   Influenza Whole 07/29/2008, 08/10/2009, 08/28/2010   Influenza, High Dose Seasonal PF 08/13/2016, 08/12/2017, 08/26/2018, 07/04/2020, 07/24/2021   Influenza,inj,Quad PF,6+ Mos 08/31/2013, 08/16/2014, 07/25/2015   PFIZER Comirnaty(Gray Top)Covid-19 Tri-Sucrose Vaccine 03/22/2021   PFIZER(Purple Top)SARS-COV-2 Vaccination 11/10/2019, 11/30/2019, 08/26/2020   PNEUMOCOCCAL CONJUGATE-20 11/05/2021   Pfizer Covid-19 Vaccine Bivalent Booster 25yr & up 07/20/2021   Pneumococcal Conjugate-13 05/09/2015   Pneumococcal Polysaccharide-23 05/27/2011   Td 05/18/2010   Tdap 07/04/2020   Zoster, Live 06/06/2016    Conditions to be addressed/monitored: Atrial Fibrillation, CAD, HTN, HLD, and OSA; preDM; neuropathy; hypothyroidism  Care Plan : General Pharmacy (Adult)  Updates made by ECherre Robins RPH-CPP since 06/13/2022 12:00 AM     Problem: Chronic Disease Management support, education, and care coordination needs related to Gout, HTN, HLD, CAD, Hypothyroidism, GERD   Priority: Medium  Onset Date: 02/22/2021  Note:   CARE PLAN ENTRY (see longitudinal plan of care for additional care plan information)  Current Barriers:  Chronic Disease Management support, education, and care  coordination needs related to Gout, HTN, HLD, CAD, Hypothyroidism, GERD   Hypertension Controlled; BP goal <135/85 Home BP readings: 120 to 135 / 70 Home HR: 60's No recent dizziness Current regimen:  Losartan 1028mdaily Hydrochlorothiazide 2536maily Metoprolol tartrate 59m44mtake 0.5 tablet once a day Interventions: Discussed blood pressure goal Check blood pressure 2 to 3 times per week.  Maintain hypertension medication regimen  Hyperlipidemia / CAD:  Controlled;  LDL goal < 70 Current regimen:  Rosuvastatin 20mg18mly Aspirin 81mg 16my  Interventions: Discussed importance of adherence Maintain cholesterol medication regimen.   Gout:  Goal: decreased gout exacerbations Patient reports no gout exacerbations since our last visit.  Current regimen:  Allopurinol 100mg d12m Colchicine 0.6mg up 59mtwice a day as needed for gout Interventions: Continue allopurinol for gout prevention Continue to use colchicine is needed for acute gout episodes.   Hypothyroidism Goal: achieve TSH within normal limits Last TSH was WNL.  Current regimen:  Levothyroxine 159mcg da16mIntervention:  Maintain hypothyroidism medication regimen Recommended he separate dose of levothyroxine from multivitamins and magnesium  Pre-Diabetes Lab Results  Component Value Date   HGBA1C 5.7 02/26/2021  At goal;  A1c goal <5.7% Diet: trying to limit red meat and carbohydrates; also limiting potion sizes Exercise: going to gym 3 times per week.  Current regimen:  Diet and exercise management   Interventions:  Continue current diet Discussed limiting portion sizes especially when eating out.  Restart exercising regularly - goal is to get at least 150 minutes of physical activity per week  Neuropathy / nerve pain:  Goal: manage nerve pain Patient reports that gabapentin has helped with nerve pain but still has some numbness in his feet. He has started B12 supplement. Last B12  level was checked  in 2010 but patient started supplement because he read it could help with numbness / neuropathy Current regimen:  Gabapentin 170m up to 3 times a day B12 supplement 10064m - taking 2 tablet = 200027mdaily Intervention:   May increase gabapentin by 100m47mery 3 to 5 days as needed to see if improves numbness in feet. Monitor of drowsiness / changes in cognition.  Continue B12 supplement. Discussed benefits of B12 supplementation.  Recommended check B12 with next lab (has PCP appointment 07/2022)  Exocrine pancreatic insufficiency: Goal: Decrease diarrhea and improve digestion  Currently regimen:  Colestid up to 3 grams per day at lunch (prescribed 06/12/2022 - patient has not started yet)  Imodium as needed Previously was taking Creon 360000 units of lipase 2 capsules 3 times a day but patient is in coverage gap and cost of therapy is about $500 per 30 days.  Interventions:  Reviewed for eligibility for medication assistance program for Creon, Pancreaze, Pertyze, Viokace and Zenpep - patient did not qualify due to either income restrictions or because program does not allow patient's with Part D to apply. Reviewed other pancreatic enzyme treatments and costs:  Creon - tier 3 on insurance - $47 / 30 days; cost in coverage gap $500 Zenpep - tier 4 on insurance - $100 / 30 days; cost in coverage gap $600 Pancreaze - not on insurance formulary; cash price is about $3.16 per capsules / $853 per month Pertzye - not on insurance formulary; cash price is about $4.05 per capsules / $1093 per month Viokace - not on insurance formulary; cast price is about $4.37 per tablet / $1180 per month  Medication management [facility] [address] has been taking magnesium for several years. He is not sure why he started. Reviewed past labs - no past history of low magnesium. Interventions Comprehensive medication review performed. Continue current medication  management strategy Reviewed refill records and assessed adherence Trial off magnesium to see if diarrhea improves since magnesium can worsen diarrhea.  Check magnesium with next labs       Medication Assistance:  Reviewed for eligibility for medication assistance program for Creon, Pancreaze, Pertyze, Viokace and Zenpep - patient did not qualify due to either income restrictions or because program does not allow patient's with Part D to apply.  [facility]  [facility] [id number]: [phone]: [phone]  [id number] [place] [facility] [id number]: [phone] Up:  Patient agrees to Care Plan and Follow-up.  Plan: Telephone follow up appointment with care management team member scheduled for:  6 months  TammyCherre RobinsrmD Clinical Pharmacist [place] [facility]

## 2022-06-13 NOTE — Patient Instructions (Signed)
Mr Ayoub It was a pleasure speaking with you today.  I have included some information about other pancreatic enzyme treatments.Unfortunately they don't seem to offer lower cost that Creon and Zenpep that you have already checked into.  Also below is a summary of your health goals and care plan.   Reviewed other pancreatic enzyme treatments and costs:  Creon - tier 3 on insurance - $47 / 30 days; cost in coverage gap $500 Zenpep - tier 4 on insurance - $100 / 30 days; cost in coverage gap $600 Pancreaze - not on insurance formulary; cash price is about $3.16 per capsules / $853 per month Pertzye - not on insurance formulary; cash price is about $4.05 per capsules / $1093 per month Viokace - not on insurance formulary; cast price is about $4.37 per tablet / $1180 per month  As always if you have any questions or concerns especially regarding medications, please feel free to contact me either at the phone number below or with a MyChart message.   Keep up the good work!  Cherre Robins, PharmD Clinical Pharmacist [facility] [phone] (direct line)  [phone] (main office number)   Chronic Care Management Care Plan  Hypertension BP Readings from Last 3 Encounters:  05/09/22 132/72  03/21/22 138/70  11/05/21 126/68   Pharmacist Clinical Goal(s): maintain BP goal <130/80 Current regimen:  Losartan '100mg'$  daily Hydrochlorithiazide '25mg'$  daily Metoprolol tartrate '25mg'$  - take 0.5 tablet once a day Patient self care activities - Over the next 180 days, patient will: Maintain hypertension medication regimen.  Check blood pressure 2 or 3 times per week, record and bring to future appointments.   Hyperlipidemia Lab Results  Component Value Date/Time   LDLCALC 57 07/04/2021 09:27 AM   LDLCALC 64 07/04/2020 09:47 AM   Pharmacist Clinical Goal(s): maintain LDL goal < 70 Current regimen:  Rosuvastatin '20mg'$  daily Patient self care activities - Over  the next 180 days, patient will: Maintain cholesterol medication regimen.   Gout:  Pharmacist Clinical Goal(s): Over the next 180 days, patient will work with PharmD and providers to decreased gout exacerbations Current regimen:  Colchicine 0.'6mg'$  up to twice a day as needed for gout Allopurinol '100mg'$  daily Patient self care activities - Over the next 180 days, patient will: Avoid foods that can increase uric acid.  Continue to take allopurinol daily as preventative Continue to use colchicine as needed for gout flares.   Hypothyroidism Pharmacist Clinical Goal(s) Over the next 90 days, patient will work with PharmD and providers to maintain TSH within normal limits Current regimen:  Levothyroxine 168mg daily Patient self care activities - Over the next 90 days, patient will: Maintain hypothyroidism medication regimen Separate dose of levothyroxine and vitamins by at least 2 hours  Pre-Diabetes Lab Results  Component Value Date/Time   HGBA1C 5.7 05/09/2022 09:15 AM   HGBA1C 5.7 02/26/2021 04:19 PM   Pharmacist Clinical Goal(s): maintain A1c goal <5.7% Current regimen:  Diet and exercise management   Patient self care activities - Over the next 180 days, patient will: Maintain a1c <5.7% Continue to limit intake of sugar Continue to exercise regularly - goal is to get at least 150 minutes of physical activity per week  Neuropathy / nerve pain:  Pharmacist Clinical Goal(s): Over the next 180 days, patient will work with PharmD and providers to manage nerve pain Current regimen:  Gabapentin '100mg'$  up to 3 times a day  B12 supplement 10059m - taking 2 tablet = 200069m  daily Patient self care activities - Over the next 180 days, patient will: May increase gabapentin by '100mg'$  every 3 to 5 days as needed to see if improves numbness in feet. Monitor for drowsiness / changes in cognition.  Continue B12 supplement.  Will check B12 with next labs   Pancreatic Enzyme  insufficiency: Goal: Decrease diarrhea and improve digestion  Current regimen:  Colestid up to 3 grams per day at lunch (prescribed 06/12/2022 - patient has not started yet)  Imodium as needed Previously was taking Creon 360000 units of lipase 2 capsules 3 times a day but patient is in coverage gap and cost of therapy is about $500 per 30 days.  Interventions: . Reviewed other pancreatic enzyme treatments and costs:  Creon - tier 3 on insurance - $47 / 30 days; cost in coverage gap $500 Zenpep - tier 4 on insurance - $100 / 30 days; cost in coverage gap $600 Pancreaze - not on insurance formulary; cash price is about $3.16 per capsules / $853 per month Pertzye - not on insurance formulary; cash price is about $4.05 per capsules / $1093 per month Viokace - not on insurance formulary; cast price is about $4.37 per tablet / $1180 per month  Medication management Pharmacist Clinical Goal(s): Maintain optimal medication adherence [facility] [address] Patient self care activities - Over the next 180 days, patient will: Focus on medication adherence by filling and taking medications appropriately  Take medications as prescribed Report any questions or concerns to PharmD and/or provider(s) Trial off magnesium to see if diarrhea improves since magnesium can worsen diarrhea.  Check magnesium with next labs  Patient verbalizes understanding of instructions and care plan provided today and agrees to view in [place]. Active MyChart status and patient understanding of how to access instructions and care plan via MyChart confirmed with patient.

## 2022-06-18 ENCOUNTER — Ambulatory Visit (HOSPITAL_COMMUNITY): Payer: Medicare HMO | Attending: Cardiology

## 2022-06-18 DIAGNOSIS — R011 Cardiac murmur, unspecified: Secondary | ICD-10-CM

## 2022-06-18 DIAGNOSIS — I25810 Atherosclerosis of coronary artery bypass graft(s) without angina pectoris: Secondary | ICD-10-CM

## 2022-06-18 LAB — ECHOCARDIOGRAM COMPLETE
AR max vel: 1.47 cm2
AV Area VTI: 1.36 cm2
AV Area mean vel: 1.54 cm2
AV Mean grad: 10 mmHg
AV Peak grad: 18.7 mmHg
Ao pk vel: 2.16 m/s
Area-P 1/2: 2.77 cm2
S' Lateral: 3.7 cm

## 2022-06-19 ENCOUNTER — Telehealth: Payer: Self-pay | Admitting: Cardiovascular Disease

## 2022-06-19 NOTE — Telephone Encounter (Signed)
Patient states he was returning call. Please advise ?

## 2022-06-19 NOTE — Telephone Encounter (Signed)
Josue Hector, MD  Michaelyn Barter, RN EF normal just mild AS overall good   Called patient with results of his echo, as written above.

## 2022-06-27 DIAGNOSIS — I10 Essential (primary) hypertension: Secondary | ICD-10-CM

## 2022-06-27 DIAGNOSIS — I2510 Atherosclerotic heart disease of native coronary artery without angina pectoris: Secondary | ICD-10-CM

## 2022-06-27 DIAGNOSIS — E785 Hyperlipidemia, unspecified: Secondary | ICD-10-CM

## 2022-07-07 DIAGNOSIS — G4733 Obstructive sleep apnea (adult) (pediatric): Secondary | ICD-10-CM | POA: Diagnosis not present

## 2022-07-11 ENCOUNTER — Other Ambulatory Visit: Payer: Self-pay | Admitting: Internal Medicine

## 2022-07-29 ENCOUNTER — Other Ambulatory Visit: Payer: Self-pay | Admitting: Internal Medicine

## 2022-07-29 DIAGNOSIS — I10 Essential (primary) hypertension: Secondary | ICD-10-CM

## 2022-08-06 DIAGNOSIS — G4733 Obstructive sleep apnea (adult) (pediatric): Secondary | ICD-10-CM | POA: Diagnosis not present

## 2022-08-07 DIAGNOSIS — K8681 Exocrine pancreatic insufficiency: Secondary | ICD-10-CM | POA: Diagnosis not present

## 2022-08-07 DIAGNOSIS — R197 Diarrhea, unspecified: Secondary | ICD-10-CM | POA: Diagnosis not present

## 2022-08-07 DIAGNOSIS — R151 Fecal smearing: Secondary | ICD-10-CM | POA: Diagnosis not present

## 2022-08-08 DIAGNOSIS — G4733 Obstructive sleep apnea (adult) (pediatric): Secondary | ICD-10-CM | POA: Diagnosis not present

## 2022-08-12 ENCOUNTER — Ambulatory Visit: Payer: Medicare HMO

## 2022-08-13 ENCOUNTER — Ambulatory Visit (INDEPENDENT_AMBULATORY_CARE_PROVIDER_SITE_OTHER): Payer: Medicare HMO | Admitting: *Deleted

## 2022-08-13 DIAGNOSIS — Z0000 Encounter for general adult medical examination without abnormal findings: Secondary | ICD-10-CM

## 2022-08-13 NOTE — Progress Notes (Addendum)
Subjective:   Steven Munoz is a 81 y.o. male who presents for Medicare Annual/Subsequent preventive examination.  I connected with  Delia Chimes on 08/13/22 by a audio enabled telemedicine application and verified that I am speaking with the correct person using two identifiers.  Patient Location: Home  Provider Location: Office/Clinic  I discussed the limitations of evaluation and management by telemedicine. The patient expressed understanding and agreed to proceed.   Review of Systems    Defer to PCP Cardiac Risk Factors include: advanced age (>72mn, >>76women);hypertension;male gender;dyslipidemia     Objective:    There were no vitals filed for this visit. There is no height or weight on file to calculate BMI.     08/13/2022    1:08 PM 08/07/2021   10:28 AM 09/20/2016    4:20 PM 09/19/2016    7:36 PM 05/09/2015   12:01 PM 12/21/2014    8:42 AM  Advanced Directives  Does Patient Have a Medical Advance Directive? Yes Yes Yes Yes Yes Yes  Type of AParamedicof ABradleyLiving will HNavarinoLiving will Living will;Healthcare Power of Attorney Living will;Healthcare Power of AAltusLiving will  Does patient want to make changes to medical advance directive?     Yes - information given No - Patient declined  Copy of HDivernonin Chart? No - copy requested No - copy requested No - copy requested No - copy requested  No - copy requested    Current Medications (verified) Outpatient Encounter Medications as of 08/13/2022  Medication Sig   allopurinol (ZYLOPRIM) 100 MG tablet TAKE 1 TABLET BY MOUTH EVERY DAY   aspirin EC 81 MG tablet Take 81 mg by mouth daily.   Coenzyme Q10 (COQ10) 200 MG CAPS Take 200 mg by mouth daily.   colchicine 0.6 MG tablet Take 1 tablet (0.6 mg total) by mouth 2 (two) times daily as needed.   cyanocobalamin (VITAMIN B12) 1000 MCG tablet Take 2,000 mcg  by mouth daily.   diphenoxylate-atropine (LOMOTIL) 2.5-0.025 MG tablet Take by mouth as needed for diarrhea or loose stools. (Patient not taking: Reported on 06/13/2022)   famotidine (PEPCID) 40 MG tablet Take 40 mg by mouth at bedtime.   hydrochlorothiazide (HYDRODIURIL) 25 MG tablet Take 1 tablet (25 mg total) by mouth daily.   hydrocortisone 2.5 % cream Apply 1 application topically as needed.   levothyroxine (SYNTHROID) 125 MCG tablet Take 1 tablet (125 mcg total) by mouth daily before breakfast.   losartan (COZAAR) 100 MG tablet Take 1 tablet (100 mg total) by mouth daily.   Magnesium 400 MG CAPS Take 400 mg by mouth daily.   metoprolol tartrate (LOPRESSOR) 25 MG tablet TAKE 1/2 TABLET DAILY      *MYLAN MFR*   Multiple Vitamins-Minerals (CENTRUM SILVER PO) Take 1 tablet by mouth daily.   Multiple Vitamins-Minerals (PRESERVISION AREDS 2+MULTI VIT) CAPS Take 1 capsule by mouth 2 (two) times daily.    Omega-3 Fatty Acids (OMEGA 3 500 PO) Take 1,000 mg by mouth daily. Has '1000mg'$  capsules   potassium chloride (KLOR-CON) 10 MEQ tablet TAKE 1 TABLET BY MOUTH EVERY DAY   rosuvastatin (CRESTOR) 20 MG tablet TAKE 1 TABLET DAILY   sodium chloride (OCEAN) 0.65 % nasal spray Saline Nasal 0.65 % spray aerosol  Take by nasal route.   [DISCONTINUED] CREON 36000-114000 units CPEP capsule Take 72,000 Units by mouth 3 (three) times daily. (Patient not taking: Reported  on 05/09/2022)   [DISCONTINUED] gabapentin (NEURONTIN) 100 MG capsule TAKE 1 CAPSULE BY MOUTH THREE TIMES A DAY   No facility-administered encounter medications on file as of 08/13/2022.    Allergies (verified) Povidone-iodine, Ofloxacin, and Tape   History: Past Medical History:  Diagnosis Date   CAD (coronary artery disease) CABG  05/29/09    OPERATIVE PROCEDURES:  Median sternotomy, extracorporeal circulation,    Colonic polyp 2002, 2005, 2009   [name]   Dermatophytosis of nail    Diverticulosis    Dysmetabolic syndrome    GERD  (gastroesophageal reflux disease)    S/P dilation  2005   Gout    Hyperlipidemia    Hypertension    Hypothyroidism    Murmur    Nephrolithiasis    X 1   Peripheral neuropathy 03/20/2021   Pneumonia age 62   treated as inpatient   Sleep apnea    on CPAP; Dr [name]   Past Surgical History:  Procedure Laterality Date   athroscopy     right knee X2, left knee X1   COLONOSCOPY  2002 & 2009 &2014   polypectomy; [name]   CORONARY ARTERY BYPASS GRAFT  2010   Median sternotomy, extracorporeal circulation, coronary bypass graft surgery x3 using a left internal mammary artery graft to left anterior descending coronary artery, with a sequential  saphenous vein graft to posterior descending and posterolateral branches  of the right coronary artery.  Endoscopic vein harvesting from the right   EYE SURGERY Right    tear duct   Nail Alvusion Bilateral    Hallux   NOSE SURGERY  11/20/12   L max antrectomy;septoplasty;turbinate reduction. Dr Rheney, [place]     right 2003   REPLACEMENT TOTAL KNEE     partial Left March 2009   TENDON RELEASE Right 03/2016   from R hip. @ [place]  02/22/2014   right hip; Dr [facility]   Family History  Problem Relation Age of Onset   Hypertension Mother    Transient ischemic attack Mother    Lung cancer Mother        liver cancer, smoker, TIA   Liver cancer Mother    Heart attack Father 25   Heart attack Brother 61       smoker   Diabetes Neg Hx    Colon cancer Neg Hx    Prostate cancer Neg Hx    Social History   Socioeconomic History   Marital status: Married    Spouse name: [name]   Number of children: 2   Years of education: Not on file   Highest education level: Not on file  Occupational History   Occupation: retired- Chiropodist: [employer]  Tobacco Use   Smoking status: Former    Packs/day: 1.00    Years: 6.00    Total pack years: 6.00    Types:  Cigarettes    Quit date: 10/28/1966    Years since quitting: 55.8   Smokeless tobacco: Never   Tobacco comments:    smoked 1962- 1968, up to < 1 ppd  Vaping Use   Vaping Use: Never used  Substance and Sexual Activity   Alcohol use: Yes    Alcohol/week: 3.0 standard drinks of alcohol    Types: 3 Glasses of wine per week    Comment: socially   Drug use: No   Sexual activity: Not on file  Other Topics Concern   Not on file  Social History Narrative   Socially; lives with spouse   Right Handed   Drinks no caffeine   Social Determinants of Health   Financial Resource Strain: Low Risk  (02/23/2021)   Overall Financial Resource Strain (CARDIA)    Difficulty of Paying Living Expenses: Not hard at all  Food Insecurity: No Food Insecurity (08/07/2021)   Hunger Vital Sign    Worried About Running Out of Food in the Last Year: Never true    Ran Out of Food in the Last Year: Never true  Transportation Needs: No Transportation Needs (02/23/2021)   PRAPARE - Hydrologist (Medical): No    Lack of Transportation (Non-Medical): No  Physical Activity: Sufficiently Active (06/13/2022)   Exercise Vital Sign    Days of Exercise per Week: 3 days    Minutes of Exercise per Session: 90 min  Stress: No Stress Concern Present (08/07/2021)   [place]    Feeling of Stress : Not at all  Social Connections: [place] (08/07/2021)   Social Connection and Isolation Panel [NHANES]    Frequency of Communication with Friends and Family: More than three times a week    Frequency of Social Gatherings with Friends and Family: More than three times a week    Attends Religious Services: More than 4 times per year    Active Member of [employer] or Organizations: Yes    Attends Music therapist: More than 4 times per year    Marital Status: Married    Tobacco Counseling Counseling given: Not  Answered Tobacco comments: smoked [place], up to < 1 ppd   Clinical Intake:  Pre-visit preparation completed: Yes  Pain : No/denies pain  How often do you need to have someone help you when you read instructions, pamphlets, or other written materials from your doctor or pharmacy?: 1 - Never  Diabetic? No  Activities of Daily Living    08/13/2022    1:11 PM  In your present state of health, do you have any difficulty performing the following activities:  Hearing? 1  Comment wears hearing aids  Vision? 0  Difficulty concentrating or making decisions? 0  Walking or climbing stairs? 0  Dressing or bathing? 0  Doing errands, shopping? 0  Preparing Food and eating ? N  Using the Toilet? N  In the past six months, have you accidently leaked urine? N  Do you have problems with loss of bowel control? N  Managing your Medications? N  Managing your Finances? N  Housekeeping or managing your Housekeeping? N    Patient Care Team: Colon Branch, MD as PCP - General (Internal Medicine) Josue Hector, MD as PCP - Cardiology (Cardiology) Hallows, Verlee Monte, MD as Consulting Physician (Orthopedic Surgery) Rigoberto Noel, MD as Consulting Physician (Pulmonary Disease) Richmond Campbell, MD as Consulting Physician (Gastroenterology) Shon Hough, MD as Consulting Physician (Ophthalmology) Francee Gentile, [place] as Referring Physician (Chiropractic Medicine) Cherre Robins, RPH-CPP (Pharmacist) Latanya Maudlin, MD as Consulting Physician (Orthopedic Surgery)  Indicate any recent Medical Services you may have received from other than [facility] providers in the past year (date may be approximate).     Assessment:   This is a routine wellness examination for [place].  Hearing/Vision screen No results found.  Dietary issues and exercise activities discussed: Current Exercise Habits: Structured exercise class;Home exercise routine, Type of exercise: treadmill;strength training/weights, Time  (  Minutes): 60, Frequency (Times/Week): 3, Weekly Exercise (Minutes/Week): 180, Intensity: Moderate, Exercise limited by: None identified   Goals Addressed   None    Depression Screen    08/13/2022    1:10 PM 11/05/2021    9:00 AM 08/07/2021   10:37 AM 04/19/2021   10:48 AM 01/01/2021    9:02 AM 08/25/2020    2:23 PM 07/06/2019    9:02 AM  PHQ 2/9 Scores  PHQ - 2 Score 0 0 0 0 0 0 0    Fall Risk    08/13/2022    1:09 PM 11/05/2021    9:00 AM 08/07/2021   10:33 AM 04/19/2021   10:48 AM 01/01/2021    9:02 AM  Fall Risk   Falls in the past year? 0 0 1 1 0  Number falls in past yr: 0 0 0 0   Injury with Fall? 0 0 0 1   Risk for fall due to : No Fall Risks  History of fall(s)    Follow up Falls evaluation completed Falls evaluation completed Falls prevention discussed Falls evaluation completed     FALL RISK PREVENTION PERTAINING TO THE HOME:  Any stairs in or around the home? Yes  If so, are there any without handrails? No  Home free of loose throw rugs in walkways, pet beds, electrical cords, etc? Yes  Adequate lighting in your home to reduce risk of falls? Yes   ASSISTIVE DEVICES UTILIZED TO PREVENT FALLS:  Life alert? No  Use of a cane, walker or w/c? No  Grab bars in the bathroom? Yes  Shower chair or bench in shower? No  Elevated toilet seat or a handicapped toilet? Yes   TIMED UP AND GO:  Was the test performed?  Audio visit .    Cognitive Function:        08/13/2022    1:20 PM  6CIT Screen  What Year? 0 points  What month? 0 points  What time? 0 points  Count back from 20 0 points  Months in reverse 0 points  Repeat phrase 0 points  Total Score 0 points    Immunizations Immunization History  Administered Date(s) Administered   Fluad Quad(high Dose 65+) 07/06/2019, 07/04/2022   Influenza Split 08/21/2011, 08/26/2012   Influenza Whole 07/29/2008, 08/10/2009, 08/28/2010   Influenza, High Dose Seasonal PF 08/13/2016, 08/12/2017, 08/26/2018, 07/04/2020,  07/24/2021   Influenza,inj,Quad PF,6+ Mos 08/31/2013, 08/16/2014, 07/25/2015   PFIZER Comirnaty(Gray Top)Covid-19 Tri-Sucrose Vaccine 03/22/2021   PFIZER(Purple Top)SARS-COV-2 Vaccination 11/10/2019, 11/30/2019, 08/26/2020   PNEUMOCOCCAL CONJUGATE-20 11/05/2021   Pfizer Covid-19 Vaccine Bivalent Booster 49yr & up 07/20/2021   Pneumococcal Conjugate-13 05/09/2015   Pneumococcal Polysaccharide-23 05/27/2011   Td 05/18/2010   Tdap 07/04/2020   Zoster Recombinat (Shingrix) 07/04/2022   Zoster, Live 06/06/2016    TDAP status: Up to date  Flu Vaccine status: Up to date  Pneumococcal vaccine status: Up to date  Covid-19 vaccine status: Information provided on how to obtain vaccines.   Qualifies for Shingles Vaccine? Yes   Zostavax completed Yes   Shingrix Completed?: No.    Education has been provided regarding the importance of this vaccine. Patient has been advised to call insurance company to determine out of pocket expense if they have not yet received this vaccine. Advised may also receive vaccine at local pharmacy or Health Dept. Verbalized acceptance and understanding.  Screening Tests Health Maintenance  Topic Date Due   COVID-19 Vaccine (6 - Pfizer series) 11/19/2021   COLONOSCOPY (Pts 45-472yr  Insurance coverage will need to be confirmed)  10/03/2024   TETANUS/TDAP  07/04/2030   Pneumonia Vaccine 26+ Years old  Completed   INFLUENZA VACCINE  Completed   HPV VACCINES  Aged Out   Zoster Vaccines- Shingrix  Discontinued    Health Maintenance  Health Maintenance Due  Topic Date Due   COVID-19 Vaccine (6 - Pfizer series) 11/19/2021    Colorectal cancer screening: Type of screening: Colonoscopy. Completed 10/04/19. Repeat every 5 years  Lung Cancer Screening: (Low Dose CT Chest recommended if Age 28-80 years, 30 pack-year currently smoking OR have quit w/in 15years.) does not qualify.   Lung Cancer Screening Referral: N/a  Additional Screening:  Hepatitis C  Screening: does not qualify; Completed N/a  Vision Screening: Recommended annual ophthalmology exams for early detection of glaucoma and other disorders of the eye. Is the patient up to date with their annual eye exam?  Yes  Who is the provider or what is the name of the office in which the patient attends annual eye exams? Dr. Valetta Close If pt is not established with a provider, would they like to be referred to a provider to establish care? No .   Dental Screening: Recommended annual dental exams for proper oral hygiene  Community Resource Referral / Chronic Care Management: CRR required this visit?  No   CCM required this visit?  No      Plan:     I have personally reviewed and noted the following in the patient's chart:   Medical and social history Use of alcohol, tobacco or illicit drugs  Current medications and supplements including opioid prescriptions. Patient is not currently taking opioid prescriptions. Functional ability and status Nutritional status Physical activity Advanced directives List of other physicians Hospitalizations, surgeries, and ER visits in previous 12 months Vitals Screenings to include cognitive, depression, and falls Referrals and appointments  In addition, I have reviewed and discussed with patient certain preventive protocols, quality metrics, and best practice recommendations. A written personalized care plan for preventive services as well as general preventive health recommendations were provided to patient.   Due to this being a telephonic visit, the after visit summary with patients personalized plan was offered to patient via mail or my-chart. Patient would like to access on my-chart.  Beatris Ship, [place]   08/13/2022   Nurse Notes: None   I have reviewed and agree with [facility] Coaches documentation.  [name], MD

## 2022-08-13 NOTE — Patient Instructions (Signed)
Steven Munoz , Thank you for taking time to come for your Medicare Wellness Visit. I appreciate your ongoing commitment to your health goals. Please review the following plan we discussed and let me know if I can assist you in the future.   These are the goals we discussed:  Goals      Chronic Care Management Pharmacy Care Plan     CARE PLAN ENTRY (see longitudinal plan of care for additional care plan information)  Current Barriers:  Chronic Disease Management support, education, and care coordination needs related to Gout, HTN, HLD, CAD, Hypothyroidism, GERD   Hypertension BP Readings from Last 3 Encounters:  05/09/22 132/72  03/21/22 138/70  11/05/21 126/68  Pharmacist Clinical Goal(s): Over the next 180 days, patient will work with PharmD and providers to maintain BP goal <130/80 Current regimen:  Losartan 180m daily Hydrochlorithiazide 274mdaily Metoprolol tartrate 2544m take 0.5 tablet once a day Interventions: Discussed blood pressure goal Patient self care activities - Over the next 180 days, patient will: Maintain hypertension medication regimen.  Check blood pressure 2 or 3 times per week, record and bring to future appointments.   Hyperlipidemia Lab Results  Component Value Date/Time   LDLCALC 57 07/04/2021 09:27 AM   LDLCALC 64 07/04/2020 09:47 AM  Pharmacist Clinical Goal(s): Over the next 180 days, patient will work with PharmD and providers to maintain LDL goal < 70 Current regimen:  Rosuvastatin 11m9mily Interventions: Discussed importance of adherence Patient self care activities - Over the next 180 days, patient will: Maintain cholesterol medication regimen.   Gout:  Pharmacist Clinical Goal(s): Over the next 180 days, patient will work with PharmD and providers to decreased gout exacerbations Current regimen:  Colchicine 0.6mg 99mto twice a day as needed for gout Allopurinol 100mg 80my Patient self care activities - Over the next 180 days,  patient will: Avoid foods that can increase uric acid.  Continue to take allopurinol daily as preventative Continue to use colchicine as needed for gout flares.   Hypothyroidism Pharmacist Clinical Goal(s) Over the next 90 days, patient will work with PharmD and providers to maintain TSH within normal limits Current regimen:  Levothyroxine 125mcg 35my Patient self care activities - Over the next 90 days, patient will: Maintain hypothyroidism medication regimen Separate dose of levothyroxine and vitamins by at least 2 hours  Pre-Diabetes Lab Results  Component Value Date/Time   HGBA1C 5.7 05/09/2022 09:15 AM   HGBA1C 5.7 02/26/2021 04:19 PM  Pharmacist Clinical Goal(s): Over the next 180 days, patient will work with PharmD and providers to maintain A1c goal  <5.7% Current regimen:  Diet and exercise management   Patient self care activities - Over the next 180 days, patient will: Maintain a1c <5.7% Continue to limit intake of sugar Restart exercise regularly - goal is to get at least 150 minutes of physical activity per week  Neuropathy / nerve pain:  Pharmacist Clinical Goal(s): Over the next 180 days, patient will work with PharmD and providers to manage nerve pain Current regimen:  Gabapentin 100mg up50m3 times a day  B12 supplement 1000mcg - 62mng 2 tablet = 2000mcg [id number] self care activities - Over the next 180 days, patient will: May increase gabapentin by 100mg every22mo 5 days as needed to see if improves numbness in feet. Monitor for drowsiness / changes in cognition.  Continue B12 supplement.  Recommended check B12 with next lab (has PCP appointment 07/2022)   Pancreatic Enzyme insufficiency: Goal:  Decrease diarrhea and improve digestion  Current regimen:  Colestid up to 3 grams per day at lunch (prescribed 06/12/2022 - patient has not started yet)  Imodium as needed Previously was taking Creon 360000 units of lipase 2 capsules 3 times a day but  patient is in coverage gap and cost of therapy is about $500 per 30 days.  Interventions: . Reviewed other pancreatic enzyme treatments and costs:  Creon - tier 3 on insurance - $47 / 30 days; cost in coverage gap $500 Zenpep - tier 4 on insurance - $100 / 30 days; cost in coverage gap $600 Pancreaze - not on insurance formulary; cash price is about $3.16 per capsules / $853 per month Pertzye - not on insurance formulary; cash price is about $4.05 per capsules / $1093 per month Viokace - not on insurance formulary; cast price is about $4.37 per tablet / $1180 per month  Medication management Pharmacist Clinical Goal(s): Over the next 180 days, patient will work with PharmD and providers to maintain optimal medication adherence [facility] [address] Interventions Comprehensive medication review performed. Continue current medication management strategy Patient self care activities - Over the next 180 days, patient will: Focus on medication adherence by filling and taking medications appropriately  Take medications as prescribed Report any questions or concerns to PharmD and/or provider(s) Trial off magnesium to see if diarrhea improves since magnesium can worsen diarrhea.  Check magnesium with next labs  Please see past updates related to this goal by clicking on the "Past Updates" button in the selected goal      Hyperlipidemia: LDL goal less than 70     CARE PLAN ENTRY (see longitudinal plan of care for additional care plan information)  Current Barriers:  Uncontrolled hyperlipidemia, complicated by hypertension Current antihyperlipidemic regimen: rosuvastatin 43m daily Previous antihyperlipidemic medications tried None noted  Most recent lipid panel:     Component Value Date/Time   CHOL 115 07/06/2019 0942   TRIG 104.0 07/06/2019 0942   HDL 39.30 07/06/2019 0942   CHOLHDL 3 07/06/2019 0942   VLDL 20.8 07/06/2019 0942   LDLCALC 55  07/06/2019 0942  10-year ASCVD risk score: The ASCVD Risk score (Mikey BussingDC Jr., et al., 2013) failed to calculate for the following reasons:   The valid total cholesterol range is 130 to 320 mg/dL   Pharmacist Clinical Goal(s):  Over the next 180 days, patient will work with PharmD and providers towards optimized antihyperlipidemic therapy  Interventions: Comprehensive medication review performed; medication list updated in electronic medical record.  Inter-disciplinary care team collaboration (see longitudinal plan of care)  Patient Self Care Activities:  Over the next 180 days patient will focus on medication adherence by filling HLD meds appropriately  Initial goal documentation      Hypertension: Blood pressure goal less than 140/90     CARE PLAN ENTRY (see longitudinal plan of care for additional care plan information)  Current Barriers:  Uncontrolled hypertension, complicated by hyperlipidemia Current antihypertensive regimen: losartan 1053mdaily, hctz 256maily, metoprolol tartrate 50m11m2 tab daily Previous antihypertensives tried: None noted  Last practice recorded BP readings:  BP Readings from Last 3 Encounters:  01/03/20 (!) 155/58  07/26/19 124/68  07/06/19 (!) 154/58   Current home BP readings:  Systolic Avg: 137.388.8stolic Avg: Did not average it out, but states it stays around 65 Pulse Avg: Did not average it out, but states it stays around 55 Most recent eGFR: 84.63  Pharmacist  Clinical Goal(s):  Over the next 90 days, patient will work with PharmD and providers to optimize antihypertensive regimen  Interventions: Inter-disciplinary care team collaboration (see longitudinal plan of care) Comprehensive medication review performed; medication list updated in the electronic medical record.   Patient Self Care Activities:  Over next 90 days patient will continue to check BP 2-3 times per , document, and provide at future appointments Patient will focus on  medication adherence by filling HTN meds appropriately  Initial goal documentation      Patient Stated     Lose some weight     Pre-Diabetes: A1c goal less than 6.5%     -Pre-Diabetes a1c range is 5.7% to 6.4%. Your most recent a1c was 5.7% on 07/06/19.  -Usually the full diabetes diagnosis is given when a1c reaches 6.5% or higher.  CARE PLAN ENTRY (see longitudinal plan of care for additional care plan information)  Current Barriers:  Pre-Diabetes: complicated by chronic medical conditions including hyperlipidemia and hypertension Lab Results  Component Value Date   HGBA1C 5.7 07/06/2019   Lab Results  Component Value Date   CREATININE 0.87 01/03/2020   CREATININE 0.83 07/27/2019   CREATININE 0.89 07/06/2019   GFR: 84.63 mL/min Current antihyperglycemic regimen: None Cardiovascular risk reduction: Current hypertensive regimen: losartan 152m daily, hctz 271mdaily, metoprolol tartrate 253m/2 tab daily Current hyperlipidemia regimen: rosuvastatin 39m28mily Current antiplatelet regimen: aspirin 81mg62mly  Pharmacist Clinical Goal(s):  Over the next 180 days, patient will work with PharmD and primary care provider to address maintaining a1c <6.5%  Interventions: Comprehensive medication review performed, medication list updated in electronic medical record Inter-disciplinary care team collaboration (see longitudinal plan of care)  Patient Self Care Activities:  Over the next 180 days patient will continue going to the gym 3 days a week.  Initial goal documentation       Weight < 250 lb (113.399 kg)     Plan to loose weight;  Cut out carbs; no white / rice, pasta,         This is a list of the screening recommended for you and due dates:  Health Maintenance  Topic Date Due   COVID-19 Vaccine (6 - Pfizer series) 11/19/2021   Colon Cancer Screening  10/03/2024   Tetanus Vaccine  07/04/2030   Pneumonia Vaccine  Completed   Flu Shot  Completed   HPV Vaccine   Aged Out   Zoster (Shingles) Vaccine  Discontinued     Next appointment: Follow up in one year for your annual wellness visit. 08/15/23 1:24m  70mentive Care 65 Years and Older, Male Preventive care refers to lifestyle choices and visits with your health care provider that can promote health and wellness. What does preventive care include? A yearly physical exam. This is also called an annual well check. Dental exams once or twice a year. Routine eye exams. Ask your health care provider how often you should have your eyes checked. Personal lifestyle choices, including: Daily care of your teeth and gums. Regular physical activity. Eating a healthy diet. Avoiding tobacco and drug use. Limiting alcohol use. Practicing safe sex. Taking low doses of aspirin every day. Taking vitamin and mineral supplements as recommended by your health care provider. What happens during an annual well check? The services and screenings done by your health care provider during your annual well check will depend on your age, overall health, lifestyle risk factors, and family history of disease. Counseling  Your health care provider may ask you  questions about your: Alcohol use. Tobacco use. Drug use. Emotional well-being. Home and relationship well-being. Sexual activity. Eating habits. History of falls. Memory and ability to understand (cognition). Work and work Statistician. Screening  You may have the following tests or measurements: Height, weight, and BMI. Blood pressure. Lipid and cholesterol levels. These may be checked every 5 years, or more frequently if you are over 2 years old. Skin check. Lung cancer screening. You may have this screening every year starting at age 11 if you have a 30-pack-year history of smoking and currently smoke or have quit within the past 15 years. Fecal occult blood test (FOBT) of the stool. You may have this test every year starting at age 54. Flexible  sigmoidoscopy or colonoscopy. You may have a sigmoidoscopy every 5 years or a colonoscopy every 10 years starting at age 72. Prostate cancer screening. Recommendations will vary depending on your family history and other risks. Hepatitis C blood test. Hepatitis B blood test. Sexually transmitted disease (STD) testing. Diabetes screening. This is done by checking your blood sugar (glucose) after you have not eaten for a while (fasting). You may have this done every 1-3 years. Abdominal aortic aneurysm (AAA) screening. You may need this if you are a current or former smoker. Osteoporosis. You may be screened starting at age 61 if you are at high risk. Talk with your health care provider about your test results, treatment options, and if necessary, the need for more tests. Vaccines  Your health care provider may recommend certain vaccines, such as: Influenza vaccine. This is recommended every year. Tetanus, diphtheria, and acellular pertussis (Tdap, Td) vaccine. You may need a Td booster every 10 years. Zoster vaccine. You may need this after age 69. Pneumococcal 13-valent conjugate (PCV13) vaccine. One dose is recommended after age 51. Pneumococcal polysaccharide (PPSV23) vaccine. One dose is recommended after age 57. Talk to your health care provider about which screenings and vaccines you need and how often you need them. This information is not intended to replace advice given to you by your health care provider. Make sure you discuss any questions you have with your health care provider. Document Released: 11/10/2015 Document Revised: 07/03/2016 Document Reviewed: 08/15/2015 Elsevier Interactive Patient Education  2017 [place] Prevention in the Home Falls can cause injuries. They can happen to people of all ages. There are many things you can do to make your home safe and to help prevent falls. What can I do on the outside of my home? Regularly fix the edges of walkways and  driveways and fix any cracks. Remove anything that might make you trip as you walk through a door, such as a raised step or threshold. Trim any bushes or trees on the path to your home. Use bright outdoor lighting. Clear any walking paths of anything that might make someone trip, such as rocks or tools. Regularly check to see if handrails are loose or broken. Make sure that both sides of any steps have handrails. Any raised decks and porches should have guardrails on the edges. Have any leaves, snow, or ice cleared regularly. Use sand or salt on walking paths during winter. Clean up any spills in your garage right away. This includes oil or grease spills. What can I do in the bathroom? Use night lights. Install grab bars by the toilet and in the tub and shower. Do not use towel bars as grab bars. Use non-skid mats or decals in the tub or shower. If you  need to sit down in the shower, use a plastic, non-slip stool. Keep the floor dry. Clean up any water that spills on the floor as soon as it happens. Remove soap buildup in the tub or shower regularly. Attach bath mats securely with double-sided non-slip rug tape. Do not have throw rugs and other things on the floor that can make you trip. What can I do in the bedroom? Use night lights. Make sure that you have a light by your bed that is easy to reach. Do not use any sheets or blankets that are too big for your bed. They should not hang down onto the floor. Have a firm chair that has side arms. You can use this for support while you get dressed. Do not have throw rugs and other things on the floor that can make you trip. What can I do in the kitchen? Clean up any spills right away. Avoid walking on wet floors. Keep items that you use a lot in easy-to-reach places. If you need to reach something above you, use a strong step stool that has a grab bar. Keep electrical cords out of the way. Do not use floor polish or wax that makes floors  slippery. If you must use wax, use non-skid floor wax. Do not have throw rugs and other things on the floor that can make you trip. What can I do with my stairs? Do not leave any items on the stairs. Make sure that there are handrails on both sides of the stairs and use them. Fix handrails that are broken or loose. Make sure that handrails are as long as the stairways. Check any carpeting to make sure that it is firmly attached to the stairs. Fix any carpet that is loose or worn. Avoid having throw rugs at the top or bottom of the stairs. If you do have throw rugs, attach them to the floor with carpet tape. Make sure that you have a light switch at the top of the stairs and the bottom of the stairs. If you do not have them, ask someone to add them for you. What else can I do to help prevent falls? Wear shoes that: Do not have high heels. Have rubber bottoms. Are comfortable and fit you well. Are closed at the toe. Do not wear sandals. If you use a stepladder: Make sure that it is fully opened. Do not climb a closed stepladder. Make sure that both sides of the stepladder are locked into place. Ask someone to hold it for you, if possible. Clearly mark and make sure that you can see: Any grab bars or handrails. First and last steps. Where the edge of each step is. Use tools that help you move around (mobility aids) if they are needed. These include: Canes. Walkers. Scooters. Crutches. Turn on the lights when you go into a dark area. Replace any light bulbs as soon as they burn out. Set up your furniture so you have a clear path. Avoid moving your furniture around. If any of your floors are uneven, fix them. If there are any pets around you, be aware of where they are. Review your medicines with your doctor. Some medicines can make you feel dizzy. This can increase your chance of falling. Ask your doctor what other things that you can do to help prevent falls. This information is not  intended to replace advice given to you by your health care provider. Make sure you discuss any questions you have with your  health care provider. Document Released: 08/10/2009 Document Revised: 03/21/2016 Document Reviewed: 11/18/2014 Elsevier Interactive Patient Education  2017 [employer].

## 2022-08-14 ENCOUNTER — Encounter: Payer: Self-pay | Admitting: Internal Medicine

## 2022-08-14 ENCOUNTER — Ambulatory Visit (INDEPENDENT_AMBULATORY_CARE_PROVIDER_SITE_OTHER): Payer: Medicare HMO | Admitting: Internal Medicine

## 2022-08-14 VITALS — BP 136/62 | HR 47 | Temp 97.6°F | Resp 18 | Ht 73.0 in | Wt 283.1 lb

## 2022-08-14 DIAGNOSIS — K8681 Exocrine pancreatic insufficiency: Secondary | ICD-10-CM | POA: Diagnosis not present

## 2022-08-14 DIAGNOSIS — E785 Hyperlipidemia, unspecified: Secondary | ICD-10-CM | POA: Diagnosis not present

## 2022-08-14 DIAGNOSIS — Z0000 Encounter for general adult medical examination without abnormal findings: Secondary | ICD-10-CM

## 2022-08-14 DIAGNOSIS — I10 Essential (primary) hypertension: Secondary | ICD-10-CM | POA: Diagnosis not present

## 2022-08-14 LAB — B12 AND FOLATE PANEL
Folate: >23.9 ng/mL (ref >5.9)
Vitamin B-12: 1086 pg/mL — ABNORMAL HIGH (ref 211–911)

## 2022-08-14 LAB — MAGNESIUM: Magnesium: 2.1 mg/dL (ref 1.5–2.5)

## 2022-08-14 LAB — VITAMIN D 25 HYDROXY (VIT D DEFICIENCY, FRACTURES): VITD: 32.66 ng/mL (ref 30.00–100.00)

## 2022-08-14 LAB — LIPID PANEL
Cholesterol: 124 mg/dL (ref 0–200)
HDL: 45.6 mg/dL (ref >39.00)
LDL Cholesterol: 55 mg/dL (ref 0–99)
NonHDL: 78.09
Total CHOL/HDL Ratio: 3
Triglycerides: 114 mg/dL (ref 0.0–149.0)
VLDL: 22.8 mg/dL (ref 0.0–40.0)

## 2022-08-14 NOTE — Assessment & Plan Note (Signed)
-  Td 2021 -pnm 23- 2012;  prevnar: 2016; PNM 20: 2023 -  zostavax 05-2016 (had Bell's Palsy shortly after it,  avoid shingrix for now) -RSV discussed with patient - Covid shot d/w pt. - had a flu shot   -[facility]: multiple cscopes,  10-2012 Dr Cher Nakai; cscope again 08/2019, + polyps, tubular adenomas, next per GI   - prostate ca screening: aged out  -Labs reviewed: FLP,B12, magnesium, vitamin D, vitamin A and vitamin E  - diet/exercise: Discussed -POA discussed

## 2022-08-14 NOTE — Patient Instructions (Addendum)
Vaccines I recommend: Covid booster RSV vaccine  Check the  blood pressure regularly BP GOAL is between 110/65 and  135/85. If it is consistently higher or lower, let me know     GO TO THE LAB : Get the blood work     [place], [place] back for   a checkup in 4 to 5 months    Advanced care planning:  Do you have a "Living will", "[place] of attorney" ?   If you already have a living will or healthcare power of attorney, is recommended you bring the copy to be scanned in your chart. The document will be available to all the doctors you see in the system.  If you don't have one, please consider create one.  Advance care planning is a process that supports adults in  understanding and sharing their preferences regarding future medical care.   The patient's preferences are recorded in documents called Advance Directives.    Advanced directives are completed (and can be modified at any time) while the patient is in full mental capacity.   The documentation should be available at all times to the patient, the family and the healthcare providers.   This legal documents direct treatment decision making and/or appoint a surrogate to make the decision if the patient is not capable to do so.    Advance directives can be documented in many types of formats,  documents have names such as:  Lliving will  Durable power of attorney for healthcare (healthcare proxy or healthcare power of attorney)  Combined directives  Physician orders for life-sustaining treatment    More information at:  [email or web address]

## 2022-08-14 NOTE — Assessment & Plan Note (Signed)
Here for CPX Chronic medical problems seem well controlled.  Continue present care. - O2 sat today is a slightly low but he denies shortness of breath.  No chest pain. -No recent gout, has some pain at the base of the thumbs, recommend Voltaren gel. Pancreatic insufficiency: Much improved with colestipol.  GI requested specific labs including vitamin a, vitamin D, vitamin D.  Will do. RTC 4 to 5 months

## 2022-08-14 NOTE — Progress Notes (Signed)
Subjective:    Patient ID: Steven Munoz, male    DOB: 1941/06/08, 81 y.o.   MRN: [id number]  DOS:  08/14/2022 Type of visit - description: CPX  Here for CPX, chronic medical problems were addressed. Reports he is doing well, still goes to the gym 3 times a week without any problems.  No chest pain no difficulty breathing. Started colestipol for chronic diarrhea: Doing better.   Review of Systems  Other than above, a 14 point review of systems is negative      Past Medical History:  Diagnosis Date   CAD (coronary artery disease) CABG  05/29/09    OPERATIVE PROCEDURES:  Median sternotomy, extracorporeal circulation,    Colonic polyp 2002, 2005, 2009   [name]   Dermatophytosis of nail    Diverticulosis    Dysmetabolic syndrome    GERD (gastroesophageal reflux disease)    S/P dilation  2005   Gout    Hyperlipidemia    Hypertension    Hypothyroidism    Murmur    Nephrolithiasis    X 1   Peripheral neuropathy 03/20/2021   Pneumonia age 50   treated as inpatient   Sleep apnea    on CPAP; Dr [name]    Past Surgical History:  Procedure Laterality Date   athroscopy     right knee X2, left knee X1   COLONOSCOPY  2002 & 2009 &2014   polypectomy; [name]   CORONARY ARTERY BYPASS GRAFT  2010   Median sternotomy, extracorporeal circulation, coronary bypass graft surgery x3 using a left internal mammary artery graft to left anterior descending coronary artery, with a sequential  saphenous vein graft to posterior descending and posterolateral branches  of the right coronary artery.  Endoscopic vein harvesting from the right   EYE SURGERY Right    tear duct   Nail Alvusion Bilateral    Hallux   NOSE SURGERY  11/20/12   L max antrectomy;septoplasty;turbinate reduction. Dr Rheney, [place]     right 2003   REPLACEMENT TOTAL KNEE     partial Left March 2009   TENDON RELEASE Right 03/2016   from R hip. @ [place]  02/22/2014    right hip; Dr [facility]   Social History   Socioeconomic History   Marital status: Married    Spouse name: [name]   Number of children: 2   Years of education: Not on file   Highest education level: Not on file  Occupational History   Occupation: retired- Chiropodist: [employer]  Tobacco Use   Smoking status: Former    Packs/day: 1.00    Years: 6.00    Total pack years: 6.00    Types: Cigarettes    Quit date: 10/28/1966    Years since quitting: 55.8   Smokeless tobacco: Never   Tobacco comments:    smoked [place], up to < 1 ppd  Vaping Use   Vaping Use: Never used  Substance and Sexual Activity   Alcohol use: Yes    Alcohol/week: 3.0 standard drinks of alcohol    Types: 3 Glasses of wine per week    Comment: socially   Drug use: No   Sexual activity: Not on file  Other Topics Concern   Not on file  Social History Narrative   Socially; lives with spouse   Right Handed   Drinks no caffeine   Social  Determinants of Health   Financial Resource Strain: Low Risk  (02/23/2021)   Overall Financial Resource Strain (CARDIA)    Difficulty of Paying Living Expenses: Not hard at all  Food Insecurity: No Food Insecurity (08/07/2021)   Hunger Vital Sign    Worried About Running Out of Food in the Last Year: Never true    Ran Out of Food in the Last Year: Never true  Transportation Needs: No Transportation Needs (02/23/2021)   PRAPARE - Hydrologist (Medical): No    Lack of Transportation (Non-Medical): No  Physical Activity: Sufficiently Active (06/13/2022)   Exercise Vital Sign    Days of Exercise per Week: 3 days    Minutes of Exercise per Session: 90 min  Stress: No Stress Concern Present (08/07/2021)   [place]    Feeling of Stress : Not at all  Social Connections: Socially Integrated (08/07/2021)   Social Connection and Isolation Panel  [NHANES]    Frequency of Communication with Friends and Family: More than three times a week    Frequency of Social Gatherings with Friends and Family: More than three times a week    Attends Religious Services: More than 4 times per year    Active Member of [employer] or Organizations: Yes    Attends Music therapist: More than 4 times per year    Marital Status: Married  Human resources officer Violence: Not At Risk (08/07/2021)   Humiliation, Afraid, Rape, and Kick questionnaire    Fear of Current or Ex-Partner: No    Emotionally Abused: No    Physically Abused: No    Sexually Abused: No    Current Outpatient Medications  Medication Instructions   allopurinol (ZYLOPRIM) 100 MG tablet TAKE 1 TABLET BY MOUTH EVERY DAY   aspirin EC 81 mg, Oral, Daily   colchicine 0.6 mg, Oral, 2 times daily PRN   colestipol (COLESTID) 3 g, Oral, Daily   CoQ10 200 mg, Oral, Daily   cyanocobalamin (VITAMIN B12) 2,000 mcg, Oral, Daily   diphenoxylate-atropine (LOMOTIL) 2.5-0.025 MG tablet As needed   famotidine (PEPCID) 40 mg, Oral, Daily at bedtime   hydrochlorothiazide (HYDRODIURIL) 25 mg, Oral, Daily   hydrocortisone 2.5 % cream 1 application , Topical, As needed   levothyroxine (SYNTHROID) 125 mcg, Oral, Daily before breakfast   losartan (COZAAR) 100 mg, Oral, Daily   Magnesium 400 mg, Oral, Daily   metoprolol tartrate (LOPRESSOR) 25 MG tablet TAKE 1/2 TABLET DAILY      *MYLAN MFR*   Multiple Vitamins-Minerals (CENTRUM SILVER PO) 1 tablet, Daily   Multiple Vitamins-Minerals (PRESERVISION AREDS 2+MULTI VIT) CAPS 1 capsule, Oral, 2 times daily   Omega-3 Fatty Acids (OMEGA 3 500 PO) 1,000 mg, Oral, Daily, Has '1000mg'$  capsules   potassium chloride (KLOR-CON) 10 MEQ tablet TAKE 1 TABLET BY MOUTH EVERY DAY   rosuvastatin (CRESTOR) 20 MG tablet TAKE 1 TABLET DAILY   sodium chloride (OCEAN) 0.65 % nasal spray Saline Nasal 0.65 % spray aerosol  Take by nasal route.       Objective:   Physical  Exam BP 136/62   Pulse (!) 47   Temp 97.6 F (36.4 C) (Oral)   Resp 18   Ht '6\' 1"'$  (1.854 m)   Wt 283 lb 2 oz (128.4 kg)   SpO2 93%   BMI 37.35 kg/m  General: Well developed, NAD, BMI noted Neck: No  thyromegaly  HEENT:  Normocephalic . Face symmetric,  atraumatic Lungs:  CTA B Normal respiratory effort, no intercostal retractions, no accessory muscle use. Heart: Soft systolic murmur.  RRR.   Abdomen:  Not distended, soft, non-tender. No rebound or rigidity.   Lower extremities: no pretibial edema bilaterally  Skin: Exposed areas without rash. Not pale. Not jaundice Neurologic:  alert & oriented X3.  Speech normal, gait appropriate for age and unassisted Strength symmetric and appropriate for age.  Psych: Cognition and judgment appear intact.  Cooperative with normal attention span and concentration.  Behavior appropriate. No anxious or depressed appearing.     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  No clear evidence of radiculopathy. 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  Continue present care. - O2 sat today is a slightly low but he denies shortness of breath.  No chest pain. -No recent gout, has some pain at the base of the thumbs, recommend Voltaren gel. Pancreatic insufficiency: Much improved with colestipol.  GI requested specific labs including vitamin a, vitamin D, vitamin D.  Will do. RTC 4 to 5 months

## 2022-08-16 LAB — VITAMIN E
Gamma-Tocopherol (Vit E): <1 mg/L (ref <=4.3)
Vitamin E (Alpha Tocopherol): 24.1 mg/L — ABNORMAL HIGH (ref 5.7–19.9)

## 2022-08-16 LAB — VITAMIN A: Vitamin A (Retinoic Acid): 66 ug/dL (ref 38–98)

## 2022-08-27 DIAGNOSIS — L821 Other seborrheic keratosis: Secondary | ICD-10-CM | POA: Diagnosis not present

## 2022-08-27 DIAGNOSIS — L570 Actinic keratosis: Secondary | ICD-10-CM | POA: Diagnosis not present

## 2022-08-27 DIAGNOSIS — D692 Other nonthrombocytopenic purpura: Secondary | ICD-10-CM | POA: Diagnosis not present

## 2022-08-27 DIAGNOSIS — L820 Inflamed seborrheic keratosis: Secondary | ICD-10-CM | POA: Diagnosis not present

## 2022-08-27 NOTE — Progress Notes (Incomplete)
Patient ID: Steven Munoz, male   DOB: [birth year], 81 y.o.   MRN: [id number]     81 y.o. CABG August 2010 He had ostial LAD, distal RCA/PDA disease and had LIMA to LAD SVG Sequential to PDA and PLB EF normal circumflex not grafted. Dr Steven Munoz follow him for OSA and CPAP Activity limited by right hip pain post surgery at [facility] and f/u tendon release. Dr Steven Munoz.  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  All other systems reviewed and negative  General: There were no vitals taken for this visit. Affect appropriate Overweight white male  RLE varicosities HEENT: Tinnitus  Neck supple with no adenopathy JVP normal no bruits no thyromegaly Lungs clear with no wheezing and good diaphragmatic motion Heart:  S1/S2 SEM  murmur, no rub, gallop or click PMI normal Abdomen: benighn, BS positve, no tenderness, no AAA no bruit.  No HSM or HJR Distal pulses intact with no bruits Peripheral neuropathy in feet  Skin warm and dry Right TKR with RLE varicosities and mild edema    Current Outpatient Medications  Medication Sig Dispense Refill   allopurinol (ZYLOPRIM) 100 MG tablet TAKE 1 TABLET BY MOUTH EVERY DAY 90  tablet 1   aspirin EC 81 MG tablet Take 81 mg by mouth daily.     Coenzyme Q10 (COQ10) 200 MG CAPS Take 200 mg by mouth daily.     colchicine 0.6 MG tablet Take 1 tablet (0.6 mg total) by mouth 2 (two) times daily as needed. 180 tablet 0   colestipol (COLESTID) 1 g tablet Take 3 g by mouth daily.     cyanocobalamin (VITAMIN B12) 1000 MCG tablet Take 2,000 mcg by mouth daily.     diphenoxylate-atropine (LOMOTIL) 2.5-0.025 MG tablet Take by mouth as needed for diarrhea or loose stools. (Patient not taking: Reported on 06/13/2022)     famotidine (PEPCID) 40 MG tablet Take 40 mg by mouth at bedtime.     hydrochlorothiazide (HYDRODIURIL) 25 MG tablet Take 1 tablet (25 mg total) by mouth daily. 90 tablet 1   hydrocortisone 2.5 % cream Apply 1 application topically as needed.     levothyroxine (SYNTHROID) 125 MCG tablet Take 1 tablet (125 mcg total) by mouth daily before breakfast. 90 tablet 1   losartan (COZAAR) 100 MG tablet Take 1 tablet (100 mg total) by mouth daily. 90 tablet 1   Magnesium 400 MG CAPS Take 400 mg by mouth daily.     metoprolol tartrate (LOPRESSOR) 25 MG tablet TAKE 1/2 TABLET DAILY      *MYLAN MFR* 45 tablet 1   Multiple Vitamins-Minerals (CENTRUM SILVER PO) Take 1 tablet by mouth daily.  Multiple Vitamins-Minerals (PRESERVISION AREDS 2+MULTI VIT) CAPS Take 1 capsule by mouth 2 (two) times daily.      Omega-3 Fatty Acids (OMEGA 3 500 PO) Take 1,000 mg by mouth daily. Has '1000mg'$  capsules     potassium chloride (KLOR-CON) 10 MEQ tablet TAKE 1 TABLET BY MOUTH EVERY DAY 90 tablet 0   rosuvastatin (CRESTOR) 20 MG tablet TAKE 1 TABLET DAILY 90 tablet 1   sodium chloride (OCEAN) 0.65 % nasal spray Saline Nasal 0.65 % spray aerosol  Take by nasal route.     No current facility-administered medications for this visit.    Allergies  Povidone-iodine, Ofloxacin, and Tape  Electrocardiogram:  08/14/20 SR rate 53 normal 08/27/2022 NSR rate 62 PR 222 msec stable  Assessment and  Plan  Murmur: Mild AS by TTE 06/18/22 consider f/u in 2 years   CAD/CABG: 2010 [place] to LAD and SVG to PDA/PLB no angina continue medical Rx   PAF: post op no recurrence ASA and beta blocker LA severely dilated on 58 mm on TTE  Puts him at increased risk of recurrence   HTN: Well controlled.  Continue current medications and low sodium Dash type diet.    Thyroid:  Lab Results  Component Value Date   TSH 1.26 05/09/2022    Cholesterol  Lab Results  Component Value Date   LDLCALC 55 08/14/2022    Ortho: post right hip surgery [name]  Post tendon release in June 2018  with relief of pain  First Degree: no change on ECG today no high grade AV block 220 msec  ENT:  F/u Steven Munoz bells palsy resolved still with tinnitus wearing hearing aids post tear duct surgery    F/U in a year    [employer]

## 2022-09-03 ENCOUNTER — Encounter: Payer: Self-pay | Admitting: Internal Medicine

## 2022-09-03 ENCOUNTER — Ambulatory Visit (INDEPENDENT_AMBULATORY_CARE_PROVIDER_SITE_OTHER): Payer: Medicare HMO | Admitting: Internal Medicine

## 2022-09-03 ENCOUNTER — Ambulatory Visit (HOSPITAL_BASED_OUTPATIENT_CLINIC_OR_DEPARTMENT_OTHER)
Admission: RE | Admit: 2022-09-03 | Discharge: 2022-09-03 | Disposition: A | Discharge status: Home / Self Care | Payer: Medicare HMO | Source: Ambulatory Visit | Attending: Internal Medicine | Admitting: Internal Medicine

## 2022-09-03 VITALS — BP 134/70 | HR 57 | Temp 97.9°F | Resp 18 | Ht 73.0 in | Wt 285.4 lb

## 2022-09-03 DIAGNOSIS — R051 Acute cough: Secondary | ICD-10-CM | POA: Diagnosis not present

## 2022-09-03 DIAGNOSIS — J069 Acute upper respiratory infection, unspecified: Secondary | ICD-10-CM | POA: Diagnosis not present

## 2022-09-03 DIAGNOSIS — R059 Cough, unspecified: Secondary | ICD-10-CM | POA: Diagnosis not present

## 2022-09-03 MED ORDER — GUAIFENESIN-CODEINE 100-10 MG/5ML PO SOLN: 5.0000 mL | Freq: Every evening | ORAL | 0 refills | Status: DC | PRN

## 2022-09-03 NOTE — Progress Notes (Unsigned)
Subjective:    Patient ID: Steven Munoz, male    DOB: [birth year], 81 y.o.   MRN: [id number]  DOS:  09/03/2022 Type of visit - description: Acute visit  Symptoms started a week ago: Cough, nasal congestion, significant amount of postnasal dripping, headache. Tested negative for COVID 3 times at home, last time was yesterday. When asked, he now recalls that his family had similar symptoms last week.  At this point, the postnasal dripping has essentially stopped but he continue with cough, last night had a hard time sleeping.  No fever or chills.  No chest pain or difficulty breathing No nausea or vomiting  Review of Systems See above   Past Medical History:  Diagnosis Date   CAD (coronary artery disease) CABG  05/29/09    OPERATIVE PROCEDURES:  Median sternotomy, extracorporeal circulation,    Colonic polyp 2002, 2005, 2009   [name]   Dermatophytosis of nail    Diverticulosis    Dysmetabolic syndrome    GERD (gastroesophageal reflux disease)    S/P dilation  2005   Gout    Hyperlipidemia    Hypertension    Hypothyroidism    Murmur    Nephrolithiasis    X 1   Peripheral neuropathy 03/20/2021   Pneumonia age 78   treated as inpatient   Sleep apnea    on CPAP; Dr [name]    Past Surgical History:  Procedure Laterality Date   athroscopy     right knee X2, left knee X1   COLONOSCOPY  2002 & 2009 &2014   polypectomy; [name]   CORONARY ARTERY BYPASS GRAFT  2010   Median sternotomy, extracorporeal circulation, coronary bypass graft surgery x3 using a left internal mammary artery graft to left anterior descending coronary artery, with a sequential  saphenous vein graft to posterior descending and posterolateral branches  of the right coronary artery.  Endoscopic vein harvesting from the right   EYE SURGERY Right    tear duct   Nail Alvusion Bilateral    Hallux   NOSE SURGERY  11/20/12   L max antrectomy;septoplasty;turbinate reduction. Dr Rheney, [place]     right 2003   REPLACEMENT TOTAL KNEE     partial Left March 2009   TENDON RELEASE Right 03/2016   from R hip. @ [place]  02/22/2014   right hip; Dr [facility]    Current Outpatient Medications  Medication Instructions   allopurinol (ZYLOPRIM) 100 MG tablet TAKE 1 TABLET BY MOUTH EVERY DAY   aspirin EC 81 mg, Oral, Daily   colchicine 0.6 mg, Oral, 2 times daily PRN   colestipol (COLESTID) 3 g, Oral, Daily   CoQ10 200 mg, Oral, Daily   cyanocobalamin (VITAMIN B12) 2,000 mcg, Oral, Daily   diphenoxylate-atropine (LOMOTIL) 2.5-0.025 MG tablet As needed   famotidine (PEPCID) 40 mg, Oral, Daily at bedtime   hydrochlorothiazide (HYDRODIURIL) 25 mg, Oral, Daily   hydrocortisone 2.5 % cream 1 application , Topical, As needed   levothyroxine (SYNTHROID) 125 mcg, Oral, Daily before breakfast   losartan (COZAAR) 100 mg, Oral, Daily   Magnesium 400 mg, Oral, Daily   metoprolol tartrate (LOPRESSOR) 25 MG tablet TAKE 1/2 TABLET DAILY      *MYLAN MFR*   Multiple Vitamins-Minerals (CENTRUM SILVER PO) 1 tablet, Daily   Multiple Vitamins-Minerals (PRESERVISION AREDS 2+MULTI VIT) CAPS 1 capsule, Oral, 2 times daily   Omega-3 Fatty Acids (OMEGA 3 500 PO) 1,000  mg, Oral, Daily, Has '1000mg'$  capsules   potassium chloride (KLOR-CON) 10 MEQ tablet TAKE 1 TABLET BY MOUTH EVERY DAY   rosuvastatin (CRESTOR) 20 MG tablet TAKE 1 TABLET DAILY   sodium chloride (OCEAN) 0.65 % nasal spray Saline Nasal 0.65 % spray aerosol  Take by nasal route.       Objective:   Physical Exam BP 134/70   Pulse (!) 57   Temp 97.9 F (36.6 C) (Oral)   Resp 18   Ht '6\' 1"'$  (1.854 m)   Wt 285 lb 6 oz (129.4 kg)   SpO2 94%   BMI 37.65 kg/m  General:   Well developed, NAD, BMI noted. HEENT:  Normocephalic . Face symmetric, atraumatic TMs normal Throat minimal redness, no white patches. Sinuses non-TTP Lungs:  CTA B Normal respiratory effort, no intercostal retractions,  no accessory muscle use. Heart: RRR,  no murmur.  Lower extremities: no pretibial edema bilaterally  Skin: Not pale. Not jaundice Neurologic:  alert & oriented X3.  Speech normal, gait appropriate for age and unassisted Psych--  Cognition and judgment appear intact.  Cooperative with normal attention span and concentration.  Behavior appropriate. No anxious or depressed appearing.      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  No clear evidence of radiculopathy. 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 Plan: Conservative treatment with Robitussin-DM, rest, fluids.  We will also send codeine-based syrup to help him sleep at night. Call if not gradually better Hypoxemia?  O2 sat at the last visit was 93%, today 94%, I have entered repeated with a different pulse oximeter and got 96%.  Unclear if he truly is hypoxic, will check a chest x-ray for completeness.    10-18 Here for CPX Chronic medical problems seem well controlled.  Continue present care. - O2 sat today is a slightly low but he denies shortness of breath.  No chest pain. -No recent gout, has some pain at the base of the thumbs, recommend Voltaren gel. Pancreatic insufficiency: Much improved with colestipol.  GI requested specific labs including vitamin a, vitamin D, vitamin D.  Will do. RTC 4 to 5 months

## 2022-09-03 NOTE — Patient Instructions (Addendum)
Please get a chest x-ray downstairs  For your respiratory infection: Rest Drink plenty of fluids Get Mucinex DM over-the-counter or Robitussin-DM, take that as needed for cough. I am sending a codeine-based syrup for cough suppression at nighttime.  Will cause drowsiness.  Be careful. Call if not gradually better.  Your oxygen level is slightly low today for the second time, at any point if you get short of breath you should let me know.

## 2022-09-03 NOTE — Progress Notes (Signed)
Patient ID: Steven Munoz, male   DOB: 1941-07-10, 81 y.o.   MRN: [id number]     81 y.o. CABG August 2010 He had ostial LAD, distal RCA/PDA disease and had LIMA to LAD SVG Sequential to PDA and PLB EF normal circumflex not grafted. Dr Steven Munoz follow him for OSA and CPAP Activity limited by right hip pain post surgery at [facility] and f/u tendon release. Dr Steven Munoz.  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 Had some low sats and URI Dr Steven Munoz ordered CXR 09/03/22 not read yet but normal to my review Told him sats in 93% range fine He has CPAP and IS at home   ROS: Denies fever, malais, weight loss, blurry vision, decreased visual acuity, cough, sputum, SOB, hemoptysis, pleuritic pain, palpitaitons, heartburn, abdominal pain, melena, lower extremity edema, claudication, or rash.  All other systems reviewed and negative  General: BP (!) 160/74   Pulse (!) 52   Ht '6\' 1"'$  (1.854 m)   Wt 284 lb (128.8 kg)   SpO2 92%   BMI 37.47 kg/m  Affect appropriate Overweight white male  RLE varicosities HEENT: Tinnitus  Neck supple with no adenopathy JVP normal no bruits no thyromegaly Lungs clear with no wheezing and good diaphragmatic motion Heart:  S1/S2 SEM  murmur, no rub, gallop or click PMI normal Abdomen: benighn, BS positve, no tenderness, no AAA no bruit.  No HSM or HJR Distal pulses  intact with no bruits Peripheral neuropathy in feet  Skin warm and dry Right TKR with RLE varicosities and mild edema    Current Outpatient Medications  Medication Sig Dispense Refill   allopurinol (ZYLOPRIM) 100 MG tablet TAKE 1 TABLET BY MOUTH EVERY DAY 90 tablet 1   aspirin EC 81 MG tablet Take 81 mg by mouth daily.     Coenzyme Q10 (COQ10) 200 MG CAPS Take 200 mg by mouth daily.     colchicine 0.6 MG tablet Take 1 tablet (0.6 mg total) by mouth 2 (two) times daily as needed. 180 tablet 0   colestipol (COLESTID) 1 g tablet Take 3 g by mouth daily.     cyanocobalamin (VITAMIN B12) 1000 MCG tablet Take 2,000 mcg by mouth daily.     diphenoxylate-atropine (LOMOTIL) 2.5-0.025 MG tablet Take by mouth as needed for diarrhea or loose stools.     famotidine (PEPCID) 40 MG tablet Take 40 mg by mouth at bedtime.     guaiFENesin-codeine 100-10 MG/5ML syrup Take 5 mLs by mouth at bedtime as needed for cough. 120 mL 0   hydrochlorothiazide (HYDRODIURIL) 25 MG tablet Take 1 tablet (25 mg total) by mouth daily. 90 tablet 1   hydrocortisone 2.5 % cream Apply 1 application topically as needed.     levothyroxine (SYNTHROID) 125 MCG tablet Take 1  tablet (125 mcg total) by mouth daily before breakfast. 90 tablet 1   losartan (COZAAR) 100 MG tablet Take 1 tablet (100 mg total) by mouth daily. 90 tablet 1   Magnesium 400 MG CAPS Take 400 mg by mouth daily.     metoprolol tartrate (LOPRESSOR) 25 MG tablet TAKE 1/2 TABLET DAILY      *MYLAN MFR* 45 tablet 1   Multiple Vitamins-Minerals (CENTRUM SILVER PO) Take 1 tablet by mouth daily.     Multiple Vitamins-Minerals (PRESERVISION AREDS 2+MULTI VIT) CAPS Take 1 capsule by mouth 2 (two) times daily.      Omega-3 Fatty Acids (OMEGA 3 500 PO) Take 1,000 mg by mouth daily. Has '1000mg'$  capsules     potassium chloride (KLOR-CON) 10 MEQ tablet TAKE 1 TABLET BY MOUTH EVERY DAY 90 tablet 0   rosuvastatin (CRESTOR) 20 MG tablet TAKE 1 TABLET DAILY 90 tablet 1   sodium  chloride (OCEAN) 0.65 % nasal spray Saline Nasal 0.65 % spray aerosol  Take by nasal route.     No current facility-administered medications for this visit.    Allergies  Povidone-iodine, Ofloxacin, and Tape  Electrocardiogram:  08/14/20 SR rate 53 normal 09/06/2022 NSR rate 62 PR 222 msec stable 09/06/2022 SR rate 52 PR 216 msec   Assessment and Plan  Murmur: Mild AS by TTE 06/18/22 consider f/u in 2 years   CAD/CABG: 2010 [place] to LAD and SVG to PDA/PLB no angina continue medical Rx   PAF: post op no recurrence ASA and beta blocker LA severely dilated on 58 mm on TTE  Puts him at increased risk of recurrence   HTN: Well controlled.  Continue current medications and low sodium Dash type diet.    Thyroid:  Lab Results  Component Value Date   TSH 1.26 05/09/2022    Cholesterol  Lab Results  Component Value Date   LDLCALC 55 08/14/2022    Ortho: post right hip surgery [name]  Post tendon release in June 2018  with relief of pain  First Degree: no change on ECG today no high grade AV block PR 216 msec  ENT:  F/u Steven Munoz bells palsy resolved still with tinnitus wearing hearing aids post tear duct surgery   Dyspnea:  Check BMET/BNP volume seems stable has Hydrodiuril as diuretic CXR ok   F/U in a year    [employer]

## 2022-09-04 ENCOUNTER — Ambulatory Visit: Payer: Medicare HMO | Admitting: Cardiovascular Disease

## 2022-09-04 NOTE — Assessment & Plan Note (Signed)
URI: Respiratory symptoms started a week ago, he is somewhat better, still having significant cough. Plan: Conservative treatment with Robitussin-DM, rest, fluids.  We will also send codeine-based syrup to help him sleep at night. Call if not gradually better Hypoxemia?  O2 sat at the last visit was 93%, today 94%, eventually recheck w/ a different pulse oximeter and read  96%.  Unclear if he truly is hypoxic, will check a chest x-ray for completeness.

## 2022-09-06 ENCOUNTER — Ambulatory Visit: Payer: Medicare HMO | Attending: Cardiovascular Disease | Admitting: Cardiovascular Disease

## 2022-09-06 ENCOUNTER — Encounter: Payer: Self-pay | Admitting: Cardiovascular Disease

## 2022-09-06 VITALS — BP 160/74 | HR 52 | Ht 73.0 in | Wt 284.0 lb

## 2022-09-06 DIAGNOSIS — I25810 Atherosclerosis of coronary artery bypass graft(s) without angina pectoris: Secondary | ICD-10-CM

## 2022-09-06 DIAGNOSIS — R011 Cardiac murmur, unspecified: Secondary | ICD-10-CM

## 2022-09-06 DIAGNOSIS — R0600 Dyspnea, unspecified: Secondary | ICD-10-CM | POA: Diagnosis not present

## 2022-09-06 DIAGNOSIS — R9431 Abnormal electrocardiogram [ECG] [EKG]: Secondary | ICD-10-CM | POA: Diagnosis not present

## 2022-09-06 DIAGNOSIS — G4733 Obstructive sleep apnea (adult) (pediatric): Secondary | ICD-10-CM | POA: Diagnosis not present

## 2022-09-06 NOTE — Patient Instructions (Addendum)
Medication Instructions:  Your physician recommends that you continue on your current medications as directed. Please refer to the Current Medication list given to you today.  *If you need a refill on your cardiac medications before your next appointment, please call your pharmacy*  Lab Work: Your physician recommends that you have lab work today. BMET and BNP  If you have labs (blood work) drawn today and your tests are completely normal, you will receive your results only by: [place] (if you have MyChart) OR A paper copy in the mail If you have any lab test that is abnormal or we need to change your treatment, we will call you to review the results.  Testing/Procedures: None ordered today.  Follow-Up: At [facility], you and your health needs are our priority.  As part of our continuing mission to provide you with exceptional heart care, we have created designated Provider Care Teams.  These Care Teams include your primary Cardiologist (physician) and Advanced Practice Providers (APPs -  Physician Assistants and Nurse Practitioners) who all work together to provide you with the care you need, when you need it.  We recommend signing up for the patient portal called "MyChart".  Sign up information is provided on this After Visit Summary.  MyChart is used to connect with patients for Virtual Visits (Telemedicine).  Patients are able to view lab/test results, encounter notes, upcoming appointments, etc.  Non-urgent messages can be sent to your provider as well.   To learn more about what you can do with MyChart, go to [email or web address].    Your next appointment:   6 month(s)  The format for your next appointment:   In Person  Provider:   Jenkins Rouge, MD      Important Information About Sugar

## 2022-09-07 LAB — BASIC METABOLIC PANEL
BUN/Creatinine Ratio: 17 (ref 10–24)
BUN: 17 mg/dL (ref 8–27)
CO2: 24 mmol/L (ref 20–29)
Calcium: 9.6 mg/dL (ref 8.6–10.2)
Chloride: 101 mmol/L (ref 96–106)
Creatinine, Ser: 0.98 mg/dL (ref 0.76–1.27)
Glucose: 94 mg/dL (ref 70–99)
Potassium: 4.2 mmol/L (ref 3.5–5.2)
Sodium: 139 mmol/L (ref 134–144)
eGFR: 77 mL/min/{1.73_m2} (ref >59)

## 2022-09-07 LAB — PRO B NATRIURETIC PEPTIDE: NT-Pro BNP: 90 pg/mL (ref 0–486)

## 2022-09-08 DIAGNOSIS — G4733 Obstructive sleep apnea (adult) (pediatric): Secondary | ICD-10-CM | POA: Diagnosis not present

## 2022-09-19 ENCOUNTER — Other Ambulatory Visit: Payer: Self-pay | Admitting: Internal Medicine

## 2022-09-27 DIAGNOSIS — H353131 Nonexudative age-related macular degeneration, bilateral, early dry stage: Secondary | ICD-10-CM | POA: Diagnosis not present

## 2022-09-27 DIAGNOSIS — H5203 Hypermetropia, bilateral: Secondary | ICD-10-CM | POA: Diagnosis not present

## 2022-10-06 ENCOUNTER — Other Ambulatory Visit: Payer: Self-pay | Admitting: Internal Medicine

## 2022-10-06 DIAGNOSIS — G4733 Obstructive sleep apnea (adult) (pediatric): Secondary | ICD-10-CM | POA: Diagnosis not present

## 2022-10-08 DIAGNOSIS — G4733 Obstructive sleep apnea (adult) (pediatric): Secondary | ICD-10-CM | POA: Diagnosis not present

## 2022-10-17 ENCOUNTER — Other Ambulatory Visit: Payer: Self-pay | Admitting: Internal Medicine

## 2022-10-17 DIAGNOSIS — Z8739 Personal history of other diseases of the musculoskeletal system and connective tissue: Secondary | ICD-10-CM

## 2022-10-17 DIAGNOSIS — M109 Gout, unspecified: Secondary | ICD-10-CM

## 2022-11-01 ENCOUNTER — Other Ambulatory Visit: Payer: Self-pay | Admitting: *Deleted

## 2022-11-01 ENCOUNTER — Other Ambulatory Visit: Payer: Self-pay | Admitting: Rheumatology

## 2022-11-01 ENCOUNTER — Telehealth: Payer: Self-pay | Admitting: *Deleted

## 2022-11-01 DIAGNOSIS — M1A0790 Idiopathic chronic gout, unspecified ankle and foot, without tophus (tophi): Secondary | ICD-10-CM | POA: Diagnosis not present

## 2022-11-01 DIAGNOSIS — Z8739 Personal history of other diseases of the musculoskeletal system and connective tissue: Secondary | ICD-10-CM

## 2022-11-01 DIAGNOSIS — M109 Gout, unspecified: Secondary | ICD-10-CM

## 2022-11-01 NOTE — Telephone Encounter (Signed)
Patient contacted the office stating he will run out of allopurinol prior to his appointment. Patient advised he is due for labs. Patient plans to come into the office on Monday to have the labs drawn.

## 2022-11-01 NOTE — Telephone Encounter (Unsigned)
Patient is at the office today for labwork and wanted to confirm when Dr. Benjamine Mola receives his results the prescription of Allopurinol will be sent to [facility] at [address]

## 2022-11-02 LAB — CBC WITH DIFFERENTIAL/PLATELET
Absolute Monocytes: 561 cells/uL (ref 200–950)
Basophils Absolute: 40 cells/uL (ref 0–200)
Basophils Relative: 0.6 %
Eosinophils Absolute: 92 cells/uL (ref 15–500)
Eosinophils Relative: 1.4 %
HCT: 46.4 % (ref 38.5–50.0)
Hemoglobin: 16.1 g/dL (ref 13.2–17.1)
Lymphs Abs: 1828 cells/uL (ref 850–3900)
MCH: 32.7 pg (ref 27.0–33.0)
MCHC: 34.7 g/dL (ref 32.0–36.0)
MCV: 94.3 fL (ref 80.0–100.0)
MPV: 10.3 fL (ref 7.5–12.5)
Monocytes Relative: 8.5 %
Neutro Abs: 4079 cells/uL (ref 1500–7800)
Neutrophils Relative %: 61.8 %
Platelets: 156 10*3/uL (ref 140–400)
RBC: 4.92 10*6/uL (ref 4.20–5.80)
RDW: 12.4 % (ref 11.0–15.0)
Total Lymphocyte: 27.7 %
WBC: 6.6 10*3/uL (ref 3.8–10.8)

## 2022-11-02 LAB — COMPLETE METABOLIC PANEL WITH GFR
AG Ratio: 1.8 (calc) (ref 1.0–2.5)
ALT: 21 U/L (ref 9–46)
AST: 27 U/L (ref 10–35)
Albumin: 4.8 g/dL (ref 3.6–5.1)
Alkaline phosphatase (APISO): 66 U/L (ref 35–144)
BUN: 20 mg/dL (ref 7–25)
CO2: 26 mmol/L (ref 20–32)
Calcium: 10.1 mg/dL (ref 8.6–10.3)
Chloride: 103 mmol/L (ref 98–110)
Creat: 1.14 mg/dL (ref 0.70–1.22)
Globulin: 2.7 g/dL (calc) (ref 1.9–3.7)
Glucose, Bld: 93 mg/dL (ref 65–99)
Potassium: 4.2 mmol/L (ref 3.5–5.3)
Sodium: 140 mmol/L (ref 135–146)
Total Bilirubin: 0.9 mg/dL (ref 0.2–1.2)
Total Protein: 7.5 g/dL (ref 6.1–8.1)
eGFR: 65 mL/min/{1.73_m2} (ref >=60)

## 2022-11-02 LAB — URIC ACID: Uric Acid, Serum: 6.7 mg/dL (ref 4.0–8.0)

## 2022-11-04 MED ORDER — ALLOPURINOL 100 MG PO TABS: 100.0000 mg | ORAL_TABLET | Freq: Every day | ORAL | 1 refills | Status: DC

## 2022-11-04 NOTE — Progress Notes (Signed)
Lab results look okay. Uric acid is 6.7 which is above the goal of 6.0. However if he is having no gout flares we don't need to change the allopurinol dose right now.

## 2022-11-04 NOTE — Telephone Encounter (Signed)
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

## 2022-11-06 DIAGNOSIS — G4733 Obstructive sleep apnea (adult) (pediatric): Secondary | ICD-10-CM | POA: Diagnosis not present

## 2022-11-14 ENCOUNTER — Ambulatory Visit: Payer: Medicare HMO | Admitting: Pharmacist

## 2022-11-14 VITALS — BP 137/70

## 2022-11-14 DIAGNOSIS — I10 Essential (primary) hypertension: Secondary | ICD-10-CM

## 2022-11-14 DIAGNOSIS — E785 Hyperlipidemia, unspecified: Secondary | ICD-10-CM

## 2022-11-14 DIAGNOSIS — G629 Polyneuropathy, unspecified: Secondary | ICD-10-CM

## 2022-11-14 DIAGNOSIS — K8681 Exocrine pancreatic insufficiency: Secondary | ICD-10-CM

## 2022-11-14 NOTE — Progress Notes (Signed)
Pharmacy Note  11/14/2022 Name: Steven Munoz MRN: [id number] DOB: [birth year]  Subjective: Steven Munoz is a 82 y.o. year old male who is a primary care patient of Colon Branch, MD. Clinical Pharmacist Practitioner referral was placed to assist with medication management.    Engaged with patient by telephone for follow up visit today.  Hyperlipidemia: taking rosuvastatin '20mg'$  daily. LDL was at goal at 55 when last checked.   Elevated vitamin E: Recommended to check vitamin E by gastroenterologist. Vitamin E level was 24.1; also checked B12, vitamin A and vitamin D  Hypertension: blood pressure at cardio office was 160/74. Patient checked blood pressure with home blood pressure today and was good at 137/70.  Taking losartan '100mg'$  daily, hydrochlorothiazide '25mg'$  daily, metoprolol tartrate '25mg'$  - take 0.5 tablet daily.  Exercising daily at gym for 60 minutes.   Chronic diarrhea / Exocrine pancreatic dysfunction: Improved since started Colestipol 1 gram daily.   Neuropathy: Patient stopped gabapentin because it felt it wasn't helping much with numbness. He states he never had pain and since stopping gabapentin still has not pain.    SDOH (Social Determinants of Health) assessments and interventions performed:  SDOH Interventions    Flowsheet Row Office Visit from 11/14/2022 in [place] at [place] [facility] from 06/13/2022 in [place] at [place] Management from 02/22/2021 in [place] at [place] Interventions     Transportation Interventions -- -- Intervention Not Indicated  Financial Strain Interventions Intervention Not Indicated -- Intervention Not Indicated  Physical Activity Interventions Intervention Not Indicated Intervention Not Indicated --        Objective: Review of patient status, including review of consultants reports, laboratory and  other test data, was performed as part of comprehensive.  Lab Results  Component Value Date   CREATININE 1.14 11/01/2022   CREATININE 0.98 09/06/2022   CREATININE 0.92 05/09/2022    Lab Results  Component Value Date   HGBA1C 5.7 05/09/2022       Component Value Date/Time   CHOL 124 08/14/2022 1019   TRIG 114.0 08/14/2022 1019   HDL 45.60 08/14/2022 1019   CHOLHDL 3 08/14/2022 1019   VLDL 22.8 08/14/2022 1019   LDLCALC 55 08/14/2022 1019   LDLCALC 64 07/04/2020 0947     Clinical ASCVD: Yes  The ASCVD Risk score (Arnett DK, et al., 2019) failed to calculate for the following reasons:   The 2019 ASCVD risk score is only valid for ages 59 to 17    BP Readings from Last 3 Encounters:  11/14/22 137/70  09/06/22 (!) 160/74  09/03/22 134/70     Allergies  Allergen Reactions   Povidone-Iodine     REACTION: rash locally   Ofloxacin     Affected sleep with eye drop formulation   Tape Rash    Cloth adhesive tape-per patient Cloth adhesive tape-per patient Cloth adhesive tape-per patient    Medications Reviewed Today     Reviewed by Cherre Robins, RPH-CPP (Pharmacist) on 11/14/22 at [place] List Status: <None>   Medication Order Taking? Sig Documenting Provider Last Dose Status Informant  allopurinol (ZYLOPRIM) 100 MG tablet [id number] Yes Take 1 tablet (100 mg total) by mouth daily. Collier Salina, MD Taking Active   aspirin EC 81 MG tablet [id number] Yes Take 81 mg by mouth daily. [provider] Taking Active   Coenzyme Q10 (COQ10) 200 MG  CAPS [id number] Yes Take 200 mg by mouth daily. [provider] Taking Active   colchicine 0.6 MG tablet [id number] Yes Take 1 tablet (0.6 mg total) by mouth 2 (two) times daily as needed. Colon Branch, MD Taking Active            Med Note (CANTER, [place] Nov 05, 2021  8:58 AM) PRN  colestipol (COLESTID) 1 g tablet [id number] Yes Take 1 g by mouth daily. [provider] Taking Active    cyanocobalamin (VITAMIN B12) 1000 MCG tablet [id number] Yes Take 2,000 mcg by mouth daily. [provider] Taking Active   diphenoxylate-atropine (LOMOTIL) 2.5-0.025 MG tablet [id number] Yes Take by mouth as needed for diarrhea or loose stools. [provider] Taking Active            Med Note (CANTER, [facility] D   Thu May 09, 2022  8:51 AM) PRN  famotidine (PEPCID) 40 MG tablet [id number] Yes Take 40 mg by mouth at bedtime. [provider] Taking Active   hydrochlorothiazide (HYDRODIURIL) 25 MG tablet [id number] Yes Take 1 tablet (25 mg total) by mouth daily. Colon Branch, MD Taking Active            Med Note [facility], Adria Dill Nov 14, 2022  3:31 PM) Takes at 4pm  hydrocortisone 2.5 % cream [id number] Yes Apply 1 application topically as needed. [provider] Taking Active   levothyroxine (SYNTHROID) 125 MCG tablet [id number] Yes Take 1 tablet (125 mcg total) by mouth daily before breakfast. Colon Branch, MD Taking Active   losartan (COZAAR) 100 MG tablet [id number] Yes Take 1 tablet (100 mg total) by mouth daily. Colon Branch, MD Taking Active   Magnesium 400 MG CAPS [id number] Yes Take 400 mg by mouth daily. [provider] Taking Active Self  metoprolol tartrate (LOPRESSOR) 25 MG tablet [id number] Yes TAKE 1/2 TABLET DAILY      *MYLAN MFR* Colon Branch, MD Taking Active   Multiple Vitamins-Minerals (CENTRUM SILVER PO) [id number] Yes Take 1 tablet by mouth daily. [provider] Taking Active   Multiple Vitamins-Minerals (PRESERVISION AREDS 2+MULTI VIT) CAPS [id number] Yes Take 1 capsule by mouth 2 (two) times daily.  [provider] Taking Active   Omega-3 Fatty Acids (OMEGA 3 500 PO) [id number] Yes Take 1,000 mg by mouth daily. Has '1000mg'$  capsules [provider] Taking Active   potassium chloride (KLOR-CON) 10 MEQ tablet [id number] Yes TAKE 1 TABLET BY MOUTH EVERY DAY Dutch Quint B, FNP Taking Active   rosuvastatin (CRESTOR) 20 MG  tablet [id number] Yes TAKE 1 TABLET DAILY Colon Branch, MD Taking Active   sodium chloride (OCEAN) 0.65 % nasal spray [id number] Yes Saline Nasal 0.65 % spray aerosol  Take by nasal route. 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   Idiopathic chronic gout, unspecified site, without tophus (tophi) 10/31/2021   Peripheral neuropathy 03/20/2021   Bell's palsy 02/09/2020   Annual physical exam 06/06/2016   PCP NOTES >>>>>>>>>>>> 02/13/2016   CAD (coronary artery disease) 02/13/2016   Pedal edema 01/25/2016   Obesity (BMI 30-39.9) 06/02/2014   CTS (carpal tunnel syndrome) 06/01/2014   Varicose veins 04/07/2014   Prolonged P-R interval 03/04/2013   NEPHROLITHIASIS, HX OF 05/18/2010   ATRIAL FIBRILLATION 06/20/2009   CORONARY ARTERY BYPASS GRAFT, HX  OF 06/20/2009   PREMATURE VENTRICULAR CONTRACTIONS 05/09/2009   MURMUR 05/08/2009   Essential hypertension 04/10/2009   DIVERTICULOSIS, COLON 04/10/2009   G E R D 08/05/2008   OSA on CPAP 08/05/2008   DERMATOPHYTOSIS OF NAIL 07/29/2008   Nocturia 05/30/2008   Hypothyroidism 02/15/2008   Elevated lipids 02/15/2008   History of gout 11/57/2620   DYSMETABOLIC SYNDROME X 35/59/7416   History of colonic polyps 01/14/2008     Medication Assistance:  None required.  Patient affirms current coverage meets needs.   Assessment / Plan: Hypertension- Controlled.  Continue current therapy for hypertension.  Hyperlipidemia / CAD: LDL at goal. Continue rosuvastatin Neuropathy / nerve pain:  Continue to take over-the-counter B12 daily.  Ok to remain off gabapentin.  Exocrine pancreatic insufficiency:  Continue Colestid 1 gram daily High Serum Vitamin E:  Recommended he stop multi-vitamin  Recheck serum vitamin E at next visit with PCP.  Medication management:  Reviewed and updated medication list Reviewed refill history and  adherence    Follow Up:  Telephone follow up appointment with care management team member scheduled for:  6 months   Cherre Robins, PharmD Clinical Pharmacist [place] [phone]

## 2022-11-21 DIAGNOSIS — G4733 Obstructive sleep apnea (adult) (pediatric): Secondary | ICD-10-CM | POA: Diagnosis not present

## 2022-11-25 ENCOUNTER — Ambulatory Visit: Payer: Medicare HMO | Attending: Internal Medicine | Admitting: Internal Medicine

## 2022-11-25 ENCOUNTER — Encounter: Payer: Self-pay | Admitting: Internal Medicine

## 2022-11-25 VITALS — BP 136/68 | HR 53 | Resp 16 | Ht 73.0 in | Wt 286.2 lb

## 2022-11-25 DIAGNOSIS — M159 Polyosteoarthritis, unspecified: Secondary | ICD-10-CM

## 2022-11-25 DIAGNOSIS — G609 Hereditary and idiopathic neuropathy, unspecified: Secondary | ICD-10-CM

## 2022-11-25 DIAGNOSIS — Z8739 Personal history of other diseases of the musculoskeletal system and connective tissue: Secondary | ICD-10-CM

## 2022-11-25 NOTE — Progress Notes (Signed)
Office Visit Note  Patient: Steven Munoz             Date of Birth: 1940-11-28           MRN: [id number]             PCP: Colon Branch, MD Referring: Colon Branch, MD Visit Date: 11/25/2022   Subjective:  Follow-up   History of Present Illness: Steven Munoz is a 82 y.o. male here for follow up for gout on allopurinol 100 mg daily.  He has not had any significant flares since our last visit.  He has had persistent symptoms affecting his toes with neuropathy but mostly numbness not as much pain.  He stopped taking the gabapentin without an appreciable difference.  Previous HPI 10/31/21 Steven Munoz is a 82 y.o. male here for follow up for gout on allopurinol 100 mg daily and colchicine PRN.  He denies any recurrence of severe joint pain or inflammation in the ankles since our visit last year.  He continues having some neuropathy and decreased sensation most severe in his right great toe.  He has some persistent swelling below the knees. Previously and increase in diuretics with his primary care provider partially improved this.   Previous HPI 05/02/21 Steven Munoz is a 82 y.o. male here for gout currently on allopurinol 100 mg daily and colchicine 0.6 mg PRN.  He takes HCTZ and losartan for hypertension and has history of CABG for CAD but without any chronic loop diuretic use currently.  He states his previous history of gout was at least about 10 years ago he experienced redness swelling and pain around the left great toe that lasted for a few days and improved with treatment of oral prednisone.  He did not have recurrent or chronic symptoms similar to this inflammation.  He has had some chronic osteoarthritis joint pains with bilateral pain with hand use and bony nodules he had right knee arthroplasty and has some left knee osteoarthritis and crepitus.  Earlier this year he developed pain and swelling with hot to the touch around the right ankle joint.  He took colchicine for this  that improved his symptoms but when discontinuing the colchicine after 2 weeks he experienced recurrence of the pain and inflammation.  He went back on the medicine and after about another 3 weeks of treatment he discontinued this with no return of his symptoms.  He was also started on allopurinol 100 mg daily.  Around the beginning of this episode he experienced increased right-sided sciatica type pain extending from the low back to the right knee.  Due to the sciatica he saw neurology for evaluation work-up demonstrated that he also has peripheral neuropathy probably contributing to pain in his feet as well with recent NCS and serologic testing at [facility] for this.  The symptoms overall are doing well today so he has not started taking the prescribed gabapentin at all for neuropathy.   Labs reviewed 02/2021 Uric acid 6.1     Review of Systems  Constitutional: Negative.  Negative for fatigue.  HENT:  Positive for mouth dryness. Negative for mouth sores.   Eyes:  Positive for dryness.  Respiratory: Negative.  Negative for shortness of breath.   Cardiovascular: Negative.  Negative for chest pain and palpitations.  Gastrointestinal: Negative.  Negative for blood in stool, constipation and diarrhea.  Endocrine: Negative.  Negative for increased urination.  Genitourinary: Negative.  Negative for involuntary urination.  Musculoskeletal:  Positive for gait problem. Negative for joint pain, joint pain, joint swelling, myalgias, muscle weakness, morning stiffness, muscle tenderness and myalgias.  Skin: Negative.  Negative for color change, rash, hair loss and sensitivity to sunlight.  Allergic/Immunologic: Negative.  Negative for susceptible to infections.  Neurological:  Negative for dizziness and headaches.  Hematological:  Negative for swollen glands.  Psychiatric/Behavioral: Negative.  Negative for depressed mood and sleep disturbance. The patient is not nervous/anxious.     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   Idiopathic chronic gout, unspecified site, without tophus (tophi) 10/31/2021   Peripheral neuropathy 03/20/2021   Bell's palsy 02/09/2020   Annual physical exam 06/06/2016   PCP NOTES >>>>>>>>>>>> 02/13/2016   CAD (coronary artery disease) 02/13/2016   Pedal edema 01/25/2016   Obesity (BMI 30-39.9) 06/02/2014   CTS (carpal tunnel syndrome) 06/01/2014   Varicose veins 04/07/2014   Prolonged P-R interval 03/04/2013   NEPHROLITHIASIS, HX OF 05/18/2010   ATRIAL FIBRILLATION 06/20/2009   CORONARY ARTERY BYPASS GRAFT, HX OF 06/20/2009   PREMATURE VENTRICULAR CONTRACTIONS 05/09/2009   MURMUR 05/08/2009   Essential hypertension 04/10/2009   DIVERTICULOSIS, COLON 04/10/2009   G E R D 08/05/2008   OSA on CPAP 08/05/2008   DERMATOPHYTOSIS OF NAIL 07/29/2008   Nocturia 05/30/2008   Hypothyroidism 02/15/2008   Elevated lipids 02/15/2008   History of gout [id number]   DYSMETABOLIC SYNDROME X [id number]   History of colonic polyps 01/14/2008    Past Medical History:  Diagnosis Date   CAD (coronary artery disease) CABG  05/29/09    OPERATIVE PROCEDURES:  Median sternotomy, extracorporeal circulation,    Colonic polyp 2002, 2005, 2009   [name]   Dermatophytosis of nail    Diverticulosis    Dysmetabolic syndrome    GERD (gastroesophageal reflux disease)    S/P dilation  2005   Gout    Hyperlipidemia    Hypertension    Hypothyroidism    Murmur    Nephrolithiasis    X 1   Peripheral neuropathy 03/20/2021   Pneumonia age 14   treated as inpatient   Sleep apnea    on CPAP; Dr [name]    Family History  Problem Relation Age of Onset   Hypertension Mother    Transient ischemic attack Mother    Lung cancer Mother        liver cancer, smoker, TIA   Liver cancer Mother    Heart attack Father 76   Heart attack Brother 65       smoker   Diabetes  Neg Hx    Colon cancer Neg Hx    Prostate cancer Neg Hx    Past Surgical History:  Procedure Laterality Date   athroscopy     right knee X2, left knee X1   COLONOSCOPY  2002 & 2009 &2014   polypectomy; [name]   CORONARY ARTERY BYPASS GRAFT  2010   Median sternotomy, extracorporeal circulation, coronary bypass graft surgery x3 using a left internal mammary artery graft to left anterior descending coronary artery, with a sequential  saphenous vein graft to posterior descending and posterolateral branches  of the right coronary artery.  Endoscopic vein harvesting from the right   EYE SURGERY Right    tear duct   Nail Alvusion Bilateral    Hallux   NOSE SURGERY  11/20/12   L max antrectomy;septoplasty;turbinate reduction. [name]  REPLACEMENT TOTAL KNEE     right 2003   REPLACEMENT TOTAL KNEE     partial Left March 2009   TENDON RELEASE Right 03/2016   from R hip. @ [place]  02/22/2014   right hip; Dr [facility]   Social History   Social History Narrative   Socially; lives with spouse   Right Handed   Drinks no caffeine   Immunization History  Administered Date(s) Administered   COVID-19, mRNA, vaccine(Comirnaty)12 years and older 08/23/2022   Fluad Quad(high Dose 65+) 07/06/2019, 07/04/2022   Influenza Split 08/21/2011, 08/26/2012   Influenza Whole 07/29/2008, 08/10/2009, 08/28/2010   Influenza, High Dose Seasonal PF 08/13/2016, 08/12/2017, 08/26/2018, 07/04/2020, 07/24/2021   Influenza,inj,Quad PF,6+ Mos 08/31/2013, 08/16/2014, 07/25/2015   PFIZER Comirnaty(Gray Top)Covid-19 Tri-Sucrose Vaccine 03/22/2021   PFIZER(Purple Top)SARS-COV-2 Vaccination 11/10/2019, 11/30/2019, 08/26/2020   PNEUMOCOCCAL CONJUGATE-20 11/05/2021   Pfizer Covid-19 Vaccine Bivalent Booster 55yr & up 07/20/2021   Pneumococcal Conjugate-13 05/09/2015   Pneumococcal Polysaccharide-23 05/27/2011   Td 05/18/2010   Tdap 07/04/2020   Zoster Recombinat  (Shingrix) 07/04/2022, 09/09/2022   Zoster, Live 06/06/2016     Objective: Vital Signs: BP 136/68 (BP Location: Left Arm, Patient Position: Sitting, Cuff Size: Large)   Pulse (!) 53   Resp 16   Ht 6' 1"$  (1.854 m)   Wt 286 lb 3.2 oz (129.8 kg)   BMI 37.76 kg/m    Physical Exam Constitutional:      Appearance: He is obese.  Cardiovascular:     Rate and Rhythm: Normal rate and regular rhythm.  Pulmonary:     Effort: Pulmonary effort is normal.     Breath sounds: Normal breath sounds.  Skin:    General: Skin is warm and dry.  Neurological:     Mental Status: He is alert.  Psychiatric:        Mood and Affect: Mood normal.      Musculoskeletal Exam:  Elbows full ROM no tenderness or swelling Wrists full ROM no tenderness or swelling Fingers full ROM Heberden's nodes of distal interphalangeal joints bilaterally, left thumb first CMC and MCP joint bony widening no palpable swelling Knees full ROM no tenderness or swelling bilateral crepitus Ankles full ROM no tenderness or swelling soft tissue nodule present anteriorly at the left ankle Bilateral pes planus MTPs no tenderness or swelling    Investigation: No additional findings.  Imaging: No results found.  Recent Labs: Lab Results  Component Value Date   WBC 6.6 11/01/2022   HGB 16.1 11/01/2022   PLT 156 11/01/2022   NA 140 11/01/2022   K 4.2 11/01/2022   CL 103 11/01/2022   CO2 26 11/01/2022   GLUCOSE 93 11/01/2022   BUN 20 11/01/2022   CREATININE 1.14 11/01/2022   BILITOT 0.9 11/01/2022   ALKPHOS 74 02/26/2021   AST 27 11/01/2022   ALT 21 11/01/2022   PROT 7.5 11/01/2022   ALBUMIN 4.5 02/26/2021   CALCIUM 10.1 11/01/2022   GFRAA  06/01/2009    >60        The eGFR has been calculated using the MDRD equation. This calculation has not been validated in all clinical situations. eGFR's persistently <60 mL/min signify possible Chronic Kidney Disease.    Speciality Comments: No specialty comments  available.  Procedures:  No procedures performed Allergies: Povidone-iodine, Ofloxacin, and Tape   Assessment / Plan:     Visit Diagnoses: History of gout - Plan: Uric acid  Gout appears to be well-controlled we  will recheck the uric acid level today if it is at goal we will continue allopurinol 100 mg daily.  He can just use colchicine as needed.  Annual follow-up or as needed if he suffers flare or increased arthritis symptoms.  Generalized osteoarthritis of hand  Chronic bony changes in the first [facility] and at DIP joints with no particular inflammation.  Idiopathic peripheral neuropathy  Does not seem to be associated with ongoing inflammation he is doing fine off medication at this time.    Orders: Orders Placed This Encounter  Procedures   Uric acid   No orders of the defined types were placed in this encounter.    Follow-Up Instructions: Return in about 1 year (around 11/26/2023) for Gout on allopurinol f/u 18yr   CCollier Salina MD  Note - This record has been created using [employer]  Chart creation errors have been sought, but may not always  have been located. Such creation errors do not reflect on  the standard of medical care.

## 2022-11-26 ENCOUNTER — Other Ambulatory Visit: Payer: Self-pay | Admitting: Internal Medicine

## 2022-11-26 LAB — URIC ACID: Uric Acid, Serum: 5.1 mg/dL (ref 4.0–8.0)

## 2022-12-07 DIAGNOSIS — G4733 Obstructive sleep apnea (adult) (pediatric): Secondary | ICD-10-CM | POA: Diagnosis not present

## 2022-12-16 ENCOUNTER — Encounter: Payer: Self-pay | Admitting: Internal Medicine

## 2022-12-16 ENCOUNTER — Ambulatory Visit (INDEPENDENT_AMBULATORY_CARE_PROVIDER_SITE_OTHER): Payer: Medicare HMO | Admitting: Internal Medicine

## 2022-12-16 VITALS — BP 126/66 | HR 58 | Temp 97.6°F | Resp 18 | Ht 73.0 in | Wt 284.2 lb

## 2022-12-16 DIAGNOSIS — G629 Polyneuropathy, unspecified: Secondary | ICD-10-CM | POA: Diagnosis not present

## 2022-12-16 DIAGNOSIS — E039 Hypothyroidism, unspecified: Secondary | ICD-10-CM | POA: Diagnosis not present

## 2022-12-16 DIAGNOSIS — I10 Essential (primary) hypertension: Secondary | ICD-10-CM | POA: Diagnosis not present

## 2022-12-16 LAB — TSH: TSH: 1 u[IU]/mL (ref 0.35–5.50)

## 2022-12-16 NOTE — Progress Notes (Unsigned)
Subjective:    Patient ID: Steven Munoz, male    DOB: 11/07/1940, 82 y.o.   MRN: [id number]  DOS:  12/16/2022 Type of visit - description: f/u  Since the last office visit is doing okay. Still has neuropathy symptoms.  Review of Systems See above   Past Medical History:  Diagnosis Date   CAD (coronary artery disease) CABG  05/29/09    OPERATIVE PROCEDURES:  Median sternotomy, extracorporeal circulation,    Colonic polyp 2002, 2005, 2009   [name]   Dermatophytosis of nail    Diverticulosis    Dysmetabolic syndrome    GERD (gastroesophageal reflux disease)    S/P dilation  2005   Gout    Hyperlipidemia    Hypertension    Hypothyroidism    Murmur    Nephrolithiasis    X 1   Peripheral neuropathy 03/20/2021   Pneumonia age 72   treated as inpatient   Sleep apnea    on CPAP; Dr [name]    Past Surgical History:  Procedure Laterality Date   athroscopy     right knee X2, left knee X1   COLONOSCOPY  2002 & 2009 &2014   polypectomy; [name]   CORONARY ARTERY BYPASS GRAFT  2010   Median sternotomy, extracorporeal circulation, coronary bypass graft surgery x3 using a left internal mammary artery graft to left anterior descending coronary artery, with a sequential  saphenous vein graft to posterior descending and posterolateral branches  of the right coronary artery.  Endoscopic vein harvesting from the right   EYE SURGERY Right    tear duct   Nail Alvusion Bilateral    Hallux   NOSE SURGERY  11/20/12   L max antrectomy;septoplasty;turbinate reduction. Dr Rheney, [place]     right 2003   REPLACEMENT TOTAL KNEE     partial Left March 2009   TENDON RELEASE Right 03/2016   from R hip. @ [facility]  02/22/2014   right hip; Dr [facility]    Current Outpatient Medications  Medication Instructions   allopurinol (ZYLOPRIM) 100 mg, Oral, Daily   aspirin EC 81 mg, Oral, Daily   Cholecalciferol (VITAMIN D-3) 125 MCG (5000  UT) TABS Oral   colchicine 0.6 mg, Oral, 2 times daily PRN   colestipol (COLESTID) 1 g, Oral, Daily   CoQ10 200 mg, Oral, Daily   cyanocobalamin (VITAMIN B12) 2,000 mcg, Oral, Daily   diphenoxylate-atropine (LOMOTIL) 2.5-0.025 MG tablet Oral, As needed   famotidine (PEPCID) 40 mg, Oral, Daily at bedtime   hydrochlorothiazide (HYDRODIURIL) 25 mg, Oral, Daily   hydrocortisone 2.5 % cream 1 application , Topical, As needed   levothyroxine (SYNTHROID) 125 mcg, Oral, Daily before breakfast   losartan (COZAAR) 100 mg, Oral, Daily   Magnesium 400 mg, Oral, Daily   metoprolol tartrate (LOPRESSOR) 25 MG tablet TAKE 1/2 TABLET DAILY      *MYLAN MFR*   Multiple Vitamins-Minerals (CENTRUM SILVER PO) 1 tablet, Daily   Multiple Vitamins-Minerals (PRESERVISION AREDS 2+MULTI VIT) CAPS 1 capsule, Oral, 2 times daily   Omega-3 Fatty Acids (OMEGA 3 500 PO) 1,000 mg, Oral, Daily, Has 1067m capsules   potassium chloride (KLOR-CON) 10 MEQ tablet TAKE 1 TABLET BY MOUTH EVERY DAY   rosuvastatin (CRESTOR) 20 MG tablet TAKE 1 TABLET DAILY   sodium chloride (OCEAN) 0.65 % nasal spray Saline Nasal 0.65 % spray aerosol  Take by nasal route.       Objective:  Physical Exam BP 126/66   Pulse (!) 58   Temp 97.6 F (36.4 C) (Oral)   Resp 18   Ht 6' 1"$  (1.854 m)   Wt 284 lb 4 oz (128.9 kg)   SpO2 99%   BMI 37.50 kg/m  General:   Well developed, NAD, BMI noted. HEENT:  Normocephalic . Face symmetric, atraumatic Lungs:  CTA B Normal respiratory effort, no intercostal retractions, no accessory muscle use. Heart: RRR,  no murmur.  Lower extremities: no pretibial edema bilaterally  Skin: Not pale. Not jaundice Neurologic:  alert & oriented X3.  Speech normal, gait appropriate for age and unassisted Psych--  Cognition and judgment appear intact.  Cooperative with normal attention span and concentration.  Behavior appropriate. No anxious or depressed appearing.      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  No clear evidence of radiculopathy. 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 HTN:On HCTZ losartan metoprolol potassium.  Ambulatory BPs log reviewed, well-controlled.  Last BMP okay. Polyneuropathy: Symptoms still present, he stated: They are even getting a double better or I am getting used to them". Offered medication.  Will think about it Hypothyroidism: On Synthroid, check TSH Hyperlipidemia: On Crestor.  Controlled. CAD: Asymptomatic Gout : Saw rheumatology last month, uric acid normal.  On allopurinol. RTC 07-2023 for CPX  === URI: Respiratory symptoms started a week ago, he is somewhat better, still having significant cough. Plan: Conservative treatment with Robitussin-DM, rest, fluids.  We will also send codeine-based syrup to help him sleep at night. Call if not gradually better Hypoxemia?  O2 sat at the last visit was 93%, today 94%, eventually recheck w/ a different pulse oximeter and read  96%.  Unclear if he truly is hypoxic, will check a chest x-ray for completeness.

## 2022-12-16 NOTE — Patient Instructions (Addendum)
Please bring [place] a copy of your Healthcare Power of Attorney for your chart.    Check the  blood pressure regularly BP GOAL is between 110/65 and  135/85. If it is consistently higher or lower, let me know    GO TO THE LAB : Get the blood work     [place], [place] Come back for physical exam by October 2024

## 2022-12-17 NOTE — Assessment & Plan Note (Signed)
Recent labs reviewed. HTN:On HCTZ losartan metoprolol and potassium.  Ambulatory BPs log reviewed, well-controlled.  Last BMP okay. Polyneuropathy: Symptoms still present, he stated: "Neuropathy is getting better or I am getting used to it". Offered medication.  Will think about it Hypothyroidism: On Synthroid, check TSH Hyperlipidemia: On Crestor.  Controlled. CAD: Asymptomatic Gout : Saw rheumatology last month, uric acid normal.  On allopurinol. RTC 07-2023 for CPX

## 2022-12-22 DIAGNOSIS — G4733 Obstructive sleep apnea (adult) (pediatric): Secondary | ICD-10-CM | POA: Diagnosis not present

## 2023-01-03 ENCOUNTER — Other Ambulatory Visit: Payer: Self-pay | Admitting: Family

## 2023-01-05 DIAGNOSIS — G4733 Obstructive sleep apnea (adult) (pediatric): Secondary | ICD-10-CM | POA: Diagnosis not present

## 2023-01-07 ENCOUNTER — Telehealth: Payer: Self-pay | Admitting: Internal Medicine

## 2023-01-07 MED ORDER — POTASSIUM CHLORIDE ER 10 MEQ PO TBCR: 10.0000 meq | EXTENDED_RELEASE_TABLET | Freq: Every day | ORAL | 1 refills | Status: DC

## 2023-01-07 NOTE — Telephone Encounter (Signed)
Medication: potassium chloride (KLOR-CON) 10 MEQ tablet   Has the patient contacted their pharmacy? Yes.     Preferred Pharmacy:  [id number] [place] [place] [id number]: [phone] Fax: [phone]

## 2023-01-07 NOTE — Telephone Encounter (Signed)
Rx sent 

## 2023-01-20 DIAGNOSIS — G4733 Obstructive sleep apnea (adult) (pediatric): Secondary | ICD-10-CM | POA: Diagnosis not present

## 2023-01-22 DIAGNOSIS — E039 Hypothyroidism, unspecified: Secondary | ICD-10-CM | POA: Diagnosis not present

## 2023-01-22 DIAGNOSIS — R011 Cardiac murmur, unspecified: Secondary | ICD-10-CM | POA: Diagnosis not present

## 2023-01-22 DIAGNOSIS — Z1211 Encounter for screening for malignant neoplasm of colon: Secondary | ICD-10-CM | POA: Diagnosis not present

## 2023-01-22 DIAGNOSIS — G4733 Obstructive sleep apnea (adult) (pediatric): Secondary | ICD-10-CM | POA: Diagnosis not present

## 2023-01-22 DIAGNOSIS — I10 Essential (primary) hypertension: Secondary | ICD-10-CM | POA: Diagnosis not present

## 2023-01-22 DIAGNOSIS — I4940 Unspecified premature depolarization: Secondary | ICD-10-CM | POA: Diagnosis not present

## 2023-01-22 DIAGNOSIS — K5730 Diverticulosis of large intestine without perforation or abscess without bleeding: Secondary | ICD-10-CM | POA: Diagnosis not present

## 2023-01-22 DIAGNOSIS — Z951 Presence of aortocoronary bypass graft: Secondary | ICD-10-CM | POA: Diagnosis not present

## 2023-01-22 DIAGNOSIS — K219 Gastro-esophageal reflux disease without esophagitis: Secondary | ICD-10-CM | POA: Diagnosis not present

## 2023-01-22 DIAGNOSIS — Z8719 Personal history of other diseases of the digestive system: Secondary | ICD-10-CM | POA: Diagnosis not present

## 2023-01-22 DIAGNOSIS — Z86010 Personal history of colonic polyps: Secondary | ICD-10-CM | POA: Diagnosis not present

## 2023-01-22 DIAGNOSIS — K635 Polyp of colon: Secondary | ICD-10-CM | POA: Diagnosis not present

## 2023-01-22 DIAGNOSIS — D120 Benign neoplasm of cecum: Secondary | ICD-10-CM | POA: Diagnosis not present

## 2023-01-22 DIAGNOSIS — I8390 Asymptomatic varicose veins of unspecified lower extremity: Secondary | ICD-10-CM | POA: Diagnosis not present

## 2023-01-22 DIAGNOSIS — I4891 Unspecified atrial fibrillation: Secondary | ICD-10-CM | POA: Diagnosis not present

## 2023-01-22 DIAGNOSIS — I2510 Atherosclerotic heart disease of native coronary artery without angina pectoris: Secondary | ICD-10-CM | POA: Diagnosis not present

## 2023-01-25 ENCOUNTER — Encounter (HOSPITAL_COMMUNITY): Payer: Self-pay | Admitting: Emergency Medicine

## 2023-01-25 ENCOUNTER — Emergency Department (HOSPITAL_COMMUNITY): Payer: Medicare HMO

## 2023-01-25 ENCOUNTER — Other Ambulatory Visit: Payer: Self-pay

## 2023-01-25 ENCOUNTER — Other Ambulatory Visit: Payer: Self-pay | Admitting: Internal Medicine

## 2023-01-25 ENCOUNTER — Inpatient Hospital Stay (HOSPITAL_COMMUNITY)
Admission: EM | Admit: 2023-01-25 | Discharge: 2023-01-30 | DRG: 281 | Disposition: A | Discharge status: Home / Self Care | Payer: Medicare HMO | Attending: Cardiology | Admitting: Cardiology

## 2023-01-25 DIAGNOSIS — I213 ST elevation (STEMI) myocardial infarction of unspecified site: Principal | ICD-10-CM

## 2023-01-25 DIAGNOSIS — Z800 Family history of malignant neoplasm of digestive organs: Secondary | ICD-10-CM

## 2023-01-25 DIAGNOSIS — E039 Hypothyroidism, unspecified: Secondary | ICD-10-CM | POA: Diagnosis present

## 2023-01-25 DIAGNOSIS — I2119 ST elevation (STEMI) myocardial infarction involving other coronary artery of inferior wall: Secondary | ICD-10-CM | POA: Diagnosis present

## 2023-01-25 DIAGNOSIS — Z951 Presence of aortocoronary bypass graft: Secondary | ICD-10-CM

## 2023-01-25 DIAGNOSIS — N5089 Other specified disorders of the male genital organs: Secondary | ICD-10-CM | POA: Diagnosis not present

## 2023-01-25 DIAGNOSIS — Z91048 Other nonmedicinal substance allergy status: Secondary | ICD-10-CM

## 2023-01-25 DIAGNOSIS — Z87891 Personal history of nicotine dependence: Secondary | ICD-10-CM

## 2023-01-25 DIAGNOSIS — Z7902 Long term (current) use of antithrombotics/antiplatelets: Secondary | ICD-10-CM

## 2023-01-25 DIAGNOSIS — I110 Hypertensive heart disease with heart failure: Secondary | ICD-10-CM | POA: Diagnosis present

## 2023-01-25 DIAGNOSIS — Z87442 Personal history of urinary calculi: Secondary | ICD-10-CM

## 2023-01-25 DIAGNOSIS — Z7982 Long term (current) use of aspirin: Secondary | ICD-10-CM

## 2023-01-25 DIAGNOSIS — I5022 Chronic systolic (congestive) heart failure: Secondary | ICD-10-CM | POA: Diagnosis present

## 2023-01-25 DIAGNOSIS — R079 Chest pain, unspecified: Secondary | ICD-10-CM | POA: Diagnosis not present

## 2023-01-25 DIAGNOSIS — I493 Ventricular premature depolarization: Secondary | ICD-10-CM | POA: Diagnosis present

## 2023-01-25 DIAGNOSIS — R710 Precipitous drop in hematocrit: Secondary | ICD-10-CM | POA: Diagnosis not present

## 2023-01-25 DIAGNOSIS — Y712 Prosthetic and other implants, materials and accessory cardiovascular devices associated with adverse incidents: Secondary | ICD-10-CM | POA: Diagnosis present

## 2023-01-25 DIAGNOSIS — Z96651 Presence of right artificial knee joint: Secondary | ICD-10-CM | POA: Diagnosis present

## 2023-01-25 DIAGNOSIS — Z801 Family history of malignant neoplasm of trachea, bronchus and lung: Secondary | ICD-10-CM

## 2023-01-25 DIAGNOSIS — Z8249 Family history of ischemic heart disease and other diseases of the circulatory system: Secondary | ICD-10-CM

## 2023-01-25 DIAGNOSIS — T82867A Thrombosis of cardiac prosthetic devices, implants and grafts, initial encounter: Principal | ICD-10-CM | POA: Diagnosis present

## 2023-01-25 DIAGNOSIS — Y838 Other surgical procedures as the cause of abnormal reaction of the patient, or of later complication, without mention of misadventure at the time of the procedure: Secondary | ICD-10-CM | POA: Diagnosis not present

## 2023-01-25 DIAGNOSIS — K219 Gastro-esophageal reflux disease without esophagitis: Secondary | ICD-10-CM | POA: Diagnosis present

## 2023-01-25 DIAGNOSIS — Z96641 Presence of right artificial hip joint: Secondary | ICD-10-CM | POA: Diagnosis present

## 2023-01-25 DIAGNOSIS — G4733 Obstructive sleep apnea (adult) (pediatric): Secondary | ICD-10-CM

## 2023-01-25 DIAGNOSIS — G629 Polyneuropathy, unspecified: Secondary | ICD-10-CM | POA: Diagnosis present

## 2023-01-25 DIAGNOSIS — I25810 Atherosclerosis of coronary artery bypass graft(s) without angina pectoris: Secondary | ICD-10-CM | POA: Diagnosis present

## 2023-01-25 DIAGNOSIS — E7800 Pure hypercholesterolemia, unspecified: Secondary | ICD-10-CM | POA: Diagnosis present

## 2023-01-25 DIAGNOSIS — E785 Hyperlipidemia, unspecified: Secondary | ICD-10-CM | POA: Diagnosis present

## 2023-01-25 DIAGNOSIS — I5020 Unspecified systolic (congestive) heart failure: Secondary | ICD-10-CM | POA: Diagnosis present

## 2023-01-25 DIAGNOSIS — E7841 Elevated Lipoprotein(a): Secondary | ICD-10-CM | POA: Diagnosis present

## 2023-01-25 DIAGNOSIS — Z8719 Personal history of other diseases of the digestive system: Secondary | ICD-10-CM

## 2023-01-25 DIAGNOSIS — J9811 Atelectasis: Secondary | ICD-10-CM | POA: Diagnosis not present

## 2023-01-25 DIAGNOSIS — I25110 Atherosclerotic heart disease of native coronary artery with unstable angina pectoris: Secondary | ICD-10-CM | POA: Diagnosis present

## 2023-01-25 DIAGNOSIS — Z79899 Other long term (current) drug therapy: Secondary | ICD-10-CM

## 2023-01-25 DIAGNOSIS — I10 Essential (primary) hypertension: Secondary | ICD-10-CM

## 2023-01-25 DIAGNOSIS — I97630 Postprocedural hematoma of a circulatory system organ or structure following a cardiac catheterization: Secondary | ICD-10-CM

## 2023-01-25 DIAGNOSIS — Z79890 Hormone replacement therapy (postmenopausal): Secondary | ICD-10-CM

## 2023-01-25 LAB — CBC
HCT: 43.5 % (ref 39.0–52.0)
Hemoglobin: 15.2 g/dL (ref 13.0–17.0)
MCH: 32.8 pg (ref 26.0–34.0)
MCHC: 34.9 g/dL (ref 30.0–36.0)
MCV: 93.8 fL (ref 80.0–100.0)
Platelets: 144 10*3/uL — ABNORMAL LOW (ref 150–400)
RBC: 4.64 MIL/uL (ref 4.22–5.81)
RDW: 12.7 % (ref 11.5–15.5)
WBC: 7.4 10*3/uL (ref 4.0–10.5)
nRBC: 0 % (ref 0.0–0.2)

## 2023-01-25 MED ORDER — ASPIRIN 81 MG PO CHEW
324.0000 mg | CHEWABLE_TABLET | Freq: Once | ORAL | Status: AC
  Administered 2023-01-25: 324 mg via ORAL

## 2023-01-25 MED ORDER — HEPARIN SODIUM (PORCINE) 5000 UNIT/ML IJ SOLN
4000.0000 [IU] | Freq: Once | INTRAMUSCULAR | Status: AC
  Administered 2023-01-25: 4000 [IU] via INTRAVENOUS
  Filled 2023-01-25: qty 1

## 2023-01-25 MED ORDER — HYDRALAZINE HCL 20 MG/ML IJ SOLN
10.0000 mg | Freq: Once | INTRAMUSCULAR | Status: AC
  Administered 2023-01-25: 10 mg via INTRAVENOUS
  Filled 2023-01-25: qty 1

## 2023-01-25 MED ORDER — ASPIRIN 81 MG PO CHEW
CHEWABLE_TABLET | ORAL | Status: AC
  Filled 2023-01-25: qty 4

## 2023-01-25 NOTE — ED Provider Notes (Incomplete)
[facility] Provider Note   CSN: [id number] Arrival date & time: 01/25/23  2301     History {Add pertinent medical, surgical, social history, OB history to HPI:1} Chief Complaint  Patient presents with   Hypertension   Code STEMI    Steven Munoz is a 82 y.o. male.  Patient presents to the emergency department for evaluation of elevated blood pressure and chest pain.  Patient reports that he was watching a basketball game tonight when he started having some pain in the center of his chest.  Reports that it was an aching pain, he thought he might of pulled a muscle.  He checked his blood pressure and it was elevated so he came to the ED.  He reports a history of heart disease, three-vessel CABG in 2010.       Home Medications Prior to Admission medications   Medication Sig Start Date End Date Taking? Authorizing Provider  allopurinol (ZYLOPRIM) 100 MG tablet Take 1 tablet (100 mg total) by mouth daily. 11/04/22   Collier Salina, MD  aspirin EC 81 MG tablet Take 81 mg by mouth daily.    [provider]  Cholecalciferol (VITAMIN D-3) 125 MCG (5000 UT) TABS Take by mouth.    [provider]  Coenzyme Q10 (COQ10) 200 MG CAPS Take 200 mg by mouth daily.    [provider]  colchicine 0.6 MG tablet Take 1 tablet (0.6 mg total) by mouth 2 (two) times daily as needed. 03/20/21   Colon Branch, MD  colestipol (COLESTID) 1 g tablet Take 1 g by mouth daily. 06/12/22   [provider]  cyanocobalamin (VITAMIN B12) 1000 MCG tablet Take 2,000 mcg by mouth daily.    [provider]  diphenoxylate-atropine (LOMOTIL) 2.5-0.025 MG tablet Take by mouth as needed for diarrhea or loose stools.    [provider]  famotidine (PEPCID) 40 MG tablet Take 40 mg by mouth at bedtime.    [provider]  hydrochlorothiazide (HYDRODIURIL) 25 MG tablet Take 1 tablet (25 mg total) by mouth daily. 07/29/22    Colon Branch, MD  hydrocortisone 2.5 % cream Apply 1 application topically as needed. 12/28/20   [provider]  levothyroxine (SYNTHROID) 125 MCG tablet Take 1 tablet (125 mcg total) by mouth daily before breakfast. 07/11/22   Colon Branch, MD  losartan (COZAAR) 100 MG tablet Take 1 tablet (100 mg total) by mouth daily. 07/29/22   Colon Branch, MD  Magnesium 400 MG CAPS Take 400 mg by mouth daily.    [provider]  metoprolol tartrate (LOPRESSOR) 25 MG tablet TAKE 1/2 TABLET DAILY      Anson Fret MFR* 11/26/22   Colon Branch, MD  Multiple Vitamins-Minerals (CENTRUM SILVER PO) Take 1 tablet by mouth daily. Patient not taking: Reported on 12/16/2022    [provider]  Multiple Vitamins-Minerals (PRESERVISION AREDS 2+MULTI VIT) CAPS Take 1 capsule by mouth 2 (two) times daily.     [provider]  Omega-3 Fatty Acids (OMEGA 3 500 PO) Take 1,000 mg by mouth daily. Has 1000mg  capsules    [provider]  potassium chloride (KLOR-CON) 10 MEQ tablet Take 1 tablet (10 mEq total) by mouth daily. 01/07/23   Colon Branch, MD  rosuvastatin (CRESTOR) 20 MG tablet TAKE 1 TABLET DAILY 09/21/22   Colon Branch, MD  sodium chloride (OCEAN) 0.65 % nasal spray Saline Nasal 0.65 % spray aerosol  Take by nasal route.    [provider]      Allergies    Povidone-iodine, Ofloxacin, and Tape    Review of Systems   Review of Systems  Physical Exam Updated Vital Signs BP (!) 177/79 (BP Location: Right Arm)   Pulse 70   Temp 97.6 F (36.4 C)   Resp 19   Ht 6\' 1"  (1.854 m)   Wt 128.8 kg   SpO2 98%   BMI 37.47 kg/m  Physical Exam Vitals and nursing note reviewed.  Constitutional:      General: He is not in acute distress.    Appearance: He is well-developed. He is obese.  HENT:     Head: Normocephalic and atraumatic.     Mouth/Throat:     Mouth: Mucous membranes are moist.  Eyes:     General: Vision grossly intact. Gaze aligned appropriately.     Extraocular  Movements: Extraocular movements intact.     Conjunctiva/sclera: Conjunctivae normal.  Cardiovascular:     Rate and Rhythm: Normal rate and regular rhythm.     Pulses: Normal pulses.     Heart sounds: Normal heart sounds, S1 normal and S2 normal. No murmur heard.    No friction rub. No gallop.  Pulmonary:     Effort: Pulmonary effort is normal. No respiratory distress.     Breath sounds: Normal breath sounds.  Abdominal:     Palpations: Abdomen is soft.     Tenderness: There is no abdominal tenderness. There is no guarding or rebound.     Hernia: No hernia is present.  Musculoskeletal:        General: No swelling.     Cervical back: Full passive range of motion without pain, normal range of motion and neck supple. No pain with movement, spinous process tenderness or muscular tenderness. Normal range of motion.     Right lower leg: No edema.     Left lower leg: No edema.  Skin:    General: Skin is warm and dry.     Capillary Refill: Capillary refill takes less than 2 seconds.     Findings: No ecchymosis, erythema, lesion or wound.  Neurological:     Mental Status: He is alert and oriented to person, place, and time.     GCS: GCS eye subscore is 4. GCS verbal subscore is 5. GCS motor subscore is 6.     Cranial Nerves: Cranial nerves 2-12 are intact.     Sensory: Sensation is intact.     Motor: Motor function is intact. No weakness or abnormal muscle tone.     Coordination: Coordination is intact.  Psychiatric:        Mood and Affect: Mood normal.        Speech: Speech normal.        Behavior: Behavior normal.     ED Results / Procedures / Treatments   Labs (all labs ordered are listed, but only abnormal results are displayed) Labs Reviewed  CBC - Abnormal; Notable for the following components:      Result Value   Platelets 144 (*)    All other components within normal limits  BASIC METABOLIC PANEL  TROPONIN I (HIGH SENSITIVITY)    EKG EKG  Interpretation  Date/Time:  Saturday January 25 2023 23:12:36 EDT Ventricular Rate:  67 PR Interval:  198 QRS Duration: 98 QT Interval:  394 QTC Calculation: 416 R Axis:   27 Text Interpretation: *** Critical Test Result: STEMI Normal sinus rhythm  Left ventricular hypertrophy with repolarization abnormality ( R in aVL , Romhilt-Estes ) Inferior-posterior infarct , possibly acute ** ** ACUTE MI / STEMI ** ** Consider right ventricular involvement in acute inferior infarct Abnormal ECG Confirmed by Orpah Greek [id number]) on 01/25/2023 11:35:27 PM  Radiology No results found.  Procedures Procedures  {Document cardiac monitor, telemetry assessment procedure when appropriate:1}  Medications Ordered in ED Medications  hydrALAZINE (APRESOLINE) injection 10 mg (has no administration in time range)  heparin injection 4,000 Units (4,000 Units Intravenous Given 01/25/23 2348)  aspirin chewable tablet 324 mg (324 mg Oral Given 01/25/23 2349)    ED Course/ Medical Decision Making/ A&P   {   Click here for ABCD2, HEART and other calculatorsREFRESH Note before signing :1}                          Medical Decision Making Amount and/or Complexity of Data Reviewed Labs: ordered. Radiology: ordered.  Risk OTC drugs. Prescription drug management.   Differential Diagnosis considered includes, but not limited to: STEMI; NSTEMI; myocarditis; pericarditis; pulmonary embolism; aortic dissection; pneumothorax; pneumonia; gastritis.   Patient came to the emergency department primarily because of concern over elevated blood pressure.  He did have some aching chest pain at home and now has some slight tenderness.  EKG performed in triage is consistent with inferior MI.  There are ST elevations in inferior lateral leads with reciprocal changes.  Code STEMI activated.  Presentation, history, EKG discussed with cardiology.  {Document critical care time when appropriate:1} {Document review of  labs and clinical decision tools ie heart score, Chads2Vasc2 etc:1}  {Document your independent review of radiology images, and any outside records:1} {Document your discussion with family members, caretakers, and with consultants:1} {Document social determinants of health affecting pt's care:1} {Document your decision making why or why not admission, treatments were needed:1} Final Clinical Impression(s) / ED Diagnoses Final diagnoses:  None    Rx / DC Orders ED Discharge Orders     None

## 2023-01-25 NOTE — Progress Notes (Signed)
ANTICOAGULATION CONSULT NOTE - Initial Consult  Pharmacy Consult for heparin Indication: chest pain/ACS  Allergies  Allergen Reactions   Povidone-Iodine     REACTION: rash locally   Ofloxacin     Affected sleep with eye drop formulation   Tape Rash    Cloth adhesive tape-per patient Cloth adhesive tape-per patient Cloth adhesive tape-per patient    Patient Measurements: Height: 6\' 1"  (185.4 cm) Weight: 128.8 kg (284 lb) IBW/kg (Calculated) : 79.9 Heparin Dosing Weight: 110kg  Vital Signs: Temp: 97.6 F (36.4 C) (03/30 2309) BP: 177/79 (03/30 2309) Pulse Rate: 70 (03/30 2309)  Labs: Recent Labs    01/25/23 2320  HGB 15.2  HCT 43.5  PLT 144*    Medical History: Past Medical History:  Diagnosis Date   CAD (coronary artery disease) CABG  05/29/09    OPERATIVE PROCEDURES:  Median sternotomy, extracorporeal circulation,    Colonic polyp 2002, 2005, 2009   [name]   Dermatophytosis of nail    Diverticulosis    Dysmetabolic syndrome    GERD (gastroesophageal reflux disease)    S/P dilation  2005   Gout    Hyperlipidemia    Hypertension    Hypothyroidism    Murmur    Nephrolithiasis    X 1   Peripheral neuropathy 03/20/2021   Pneumonia age 69   treated as inpatient   Sleep apnea    on CPAP; Dr [name]    Assessment: 82yo male c/o centralized CP and SOB, troponin found to be elevated, called as code STEMI at about the same time as two other STEMIs were called so plan for cath may be delayed >> to start heparin.  Goal of Therapy:  Heparin level 0.3-0.7 units/ml Monitor platelets by anticoagulation protocol: Yes   Plan:  Rec'd heparin 4000 units SUBCUTANEOUSLY (time to peak 2-4h) in ED. Will give smaller bolus of heparin 2000 units followed by infusion at 1400 units/hr. Monitor heparin levels and CBC.  Wynona Neat, PharmD, BCPS  01/25/2023,11:57 PM

## 2023-01-25 NOTE — ED Notes (Signed)
X-ray at bedside

## 2023-01-25 NOTE — ED Notes (Signed)
Pt endorses centralized chest pain tonight and increased SOB. Checked pressures several times today and were elevated. Denies nausea, vomiting, dizziness.

## 2023-01-25 NOTE — ED Notes (Signed)
CARELINK called to activate STEMI

## 2023-01-26 ENCOUNTER — Encounter (HOSPITAL_COMMUNITY)
Admission: EM | Disposition: A | Discharge status: Home / Self Care | Payer: Self-pay | Source: Home / Self Care | Attending: Cardiology

## 2023-01-26 ENCOUNTER — Inpatient Hospital Stay (HOSPITAL_COMMUNITY): Payer: Medicare HMO

## 2023-01-26 DIAGNOSIS — E785 Hyperlipidemia, unspecified: Secondary | ICD-10-CM | POA: Diagnosis not present

## 2023-01-26 DIAGNOSIS — Z800 Family history of malignant neoplasm of digestive organs: Secondary | ICD-10-CM | POA: Diagnosis not present

## 2023-01-26 DIAGNOSIS — Y712 Prosthetic and other implants, materials and accessory cardiovascular devices associated with adverse incidents: Secondary | ICD-10-CM | POA: Diagnosis not present

## 2023-01-26 DIAGNOSIS — I255 Ischemic cardiomyopathy: Secondary | ICD-10-CM | POA: Diagnosis not present

## 2023-01-26 DIAGNOSIS — I97630 Postprocedural hematoma of a circulatory system organ or structure following a cardiac catheterization: Secondary | ICD-10-CM | POA: Diagnosis not present

## 2023-01-26 DIAGNOSIS — I5022 Chronic systolic (congestive) heart failure: Secondary | ICD-10-CM | POA: Diagnosis not present

## 2023-01-26 DIAGNOSIS — I724 Aneurysm of artery of lower extremity: Secondary | ICD-10-CM

## 2023-01-26 DIAGNOSIS — I25110 Atherosclerotic heart disease of native coronary artery with unstable angina pectoris: Secondary | ICD-10-CM

## 2023-01-26 DIAGNOSIS — R710 Precipitous drop in hematocrit: Secondary | ICD-10-CM | POA: Diagnosis not present

## 2023-01-26 DIAGNOSIS — I25700 Atherosclerosis of coronary artery bypass graft(s), unspecified, with unstable angina pectoris: Secondary | ICD-10-CM | POA: Diagnosis not present

## 2023-01-26 DIAGNOSIS — I5020 Unspecified systolic (congestive) heart failure: Secondary | ICD-10-CM | POA: Diagnosis not present

## 2023-01-26 DIAGNOSIS — I2119 ST elevation (STEMI) myocardial infarction involving other coronary artery of inferior wall: Secondary | ICD-10-CM

## 2023-01-26 DIAGNOSIS — Z8249 Family history of ischemic heart disease and other diseases of the circulatory system: Secondary | ICD-10-CM | POA: Diagnosis not present

## 2023-01-26 DIAGNOSIS — E7841 Elevated Lipoprotein(a): Secondary | ICD-10-CM | POA: Diagnosis not present

## 2023-01-26 DIAGNOSIS — I25810 Atherosclerosis of coronary artery bypass graft(s) without angina pectoris: Secondary | ICD-10-CM | POA: Diagnosis not present

## 2023-01-26 DIAGNOSIS — Z7982 Long term (current) use of aspirin: Secondary | ICD-10-CM | POA: Diagnosis not present

## 2023-01-26 DIAGNOSIS — Z951 Presence of aortocoronary bypass graft: Secondary | ICD-10-CM | POA: Diagnosis not present

## 2023-01-26 DIAGNOSIS — G4733 Obstructive sleep apnea (adult) (pediatric): Secondary | ICD-10-CM | POA: Diagnosis not present

## 2023-01-26 DIAGNOSIS — Z7902 Long term (current) use of antithrombotics/antiplatelets: Secondary | ICD-10-CM | POA: Diagnosis not present

## 2023-01-26 DIAGNOSIS — N5089 Other specified disorders of the male genital organs: Secondary | ICD-10-CM | POA: Diagnosis not present

## 2023-01-26 DIAGNOSIS — E039 Hypothyroidism, unspecified: Secondary | ICD-10-CM | POA: Diagnosis not present

## 2023-01-26 DIAGNOSIS — I10 Essential (primary) hypertension: Secondary | ICD-10-CM | POA: Diagnosis not present

## 2023-01-26 DIAGNOSIS — I213 ST elevation (STEMI) myocardial infarction of unspecified site: Secondary | ICD-10-CM | POA: Insufficient documentation

## 2023-01-26 DIAGNOSIS — Z801 Family history of malignant neoplasm of trachea, bronchus and lung: Secondary | ICD-10-CM | POA: Diagnosis not present

## 2023-01-26 DIAGNOSIS — I25720 Atherosclerosis of autologous artery coronary artery bypass graft(s) with unstable angina pectoris: Secondary | ICD-10-CM | POA: Diagnosis not present

## 2023-01-26 DIAGNOSIS — I493 Ventricular premature depolarization: Secondary | ICD-10-CM | POA: Diagnosis not present

## 2023-01-26 DIAGNOSIS — T82867A Thrombosis of cardiac prosthetic devices, implants and grafts, initial encounter: Secondary | ICD-10-CM | POA: Diagnosis not present

## 2023-01-26 DIAGNOSIS — Y838 Other surgical procedures as the cause of abnormal reaction of the patient, or of later complication, without mention of misadventure at the time of the procedure: Secondary | ICD-10-CM | POA: Diagnosis not present

## 2023-01-26 DIAGNOSIS — I110 Hypertensive heart disease with heart failure: Secondary | ICD-10-CM | POA: Diagnosis not present

## 2023-01-26 DIAGNOSIS — Z87891 Personal history of nicotine dependence: Secondary | ICD-10-CM | POA: Diagnosis not present

## 2023-01-26 DIAGNOSIS — R079 Chest pain, unspecified: Secondary | ICD-10-CM | POA: Diagnosis not present

## 2023-01-26 DIAGNOSIS — E7800 Pure hypercholesterolemia, unspecified: Secondary | ICD-10-CM | POA: Diagnosis not present

## 2023-01-26 DIAGNOSIS — Z79899 Other long term (current) drug therapy: Secondary | ICD-10-CM | POA: Diagnosis not present

## 2023-01-26 DIAGNOSIS — Z79890 Hormone replacement therapy (postmenopausal): Secondary | ICD-10-CM | POA: Diagnosis not present

## 2023-01-26 DIAGNOSIS — I700 Atherosclerosis of aorta: Secondary | ICD-10-CM | POA: Diagnosis not present

## 2023-01-26 DIAGNOSIS — Z8719 Personal history of other diseases of the digestive system: Secondary | ICD-10-CM | POA: Diagnosis not present

## 2023-01-26 DIAGNOSIS — Z96651 Presence of right artificial knee joint: Secondary | ICD-10-CM | POA: Diagnosis not present

## 2023-01-26 HISTORY — PX: LEFT HEART CATH AND CORONARY ANGIOGRAPHY: CATH118249

## 2023-01-26 LAB — BASIC METABOLIC PANEL
Anion gap: 9 (ref 5–15)
BUN: 21 mg/dL (ref 8–23)
CO2: 24 mmol/L (ref 22–32)
Calcium: 9.3 mg/dL (ref 8.9–10.3)
Chloride: 103 mmol/L (ref 98–111)
Creatinine, Ser: 0.96 mg/dL (ref 0.61–1.24)
GFR, Estimated: >60 mL/min (ref >60)
Glucose, Bld: 142 mg/dL — ABNORMAL HIGH (ref 70–99)
Potassium: 3.5 mmol/L (ref 3.5–5.1)
Sodium: 136 mmol/L (ref 135–145)

## 2023-01-26 LAB — CBC
HCT: 39.3 % (ref 39.0–52.0)
HCT: 38.4 % — ABNORMAL LOW (ref 39.0–52.0)
HCT: 39.1 % (ref 39.0–52.0)
Hemoglobin: 13.9 g/dL (ref 13.0–17.0)
Hemoglobin: 12.9 g/dL — ABNORMAL LOW (ref 13.0–17.0)
Hemoglobin: 13.7 g/dL (ref 13.0–17.0)
MCH: 32.6 pg (ref 26.0–34.0)
MCH: 32.3 pg (ref 26.0–34.0)
MCH: 32.3 pg (ref 26.0–34.0)
MCHC: 35.4 g/dL (ref 30.0–36.0)
MCHC: 33.6 g/dL (ref 30.0–36.0)
MCHC: 35 g/dL (ref 30.0–36.0)
MCV: 92.3 fL (ref 80.0–100.0)
MCV: 96.2 fL (ref 80.0–100.0)
MCV: 92.2 fL (ref 80.0–100.0)
Platelets: 141 10*3/uL — ABNORMAL LOW (ref 150–400)
Platelets: 150 10*3/uL (ref 150–400)
Platelets: 161 10*3/uL (ref 150–400)
RBC: 4.26 MIL/uL (ref 4.22–5.81)
RBC: 3.99 MIL/uL — ABNORMAL LOW (ref 4.22–5.81)
RBC: 4.24 MIL/uL (ref 4.22–5.81)
RDW: 12.6 % (ref 11.5–15.5)
RDW: 12.7 % (ref 11.5–15.5)
RDW: 13.2 % (ref 11.5–15.5)
WBC: 8.7 10*3/uL (ref 4.0–10.5)
WBC: 8.9 10*3/uL (ref 4.0–10.5)
WBC: 12.8 10*3/uL — ABNORMAL HIGH (ref 4.0–10.5)
nRBC: 0 % (ref 0.0–0.2)
nRBC: 0 % (ref 0.0–0.2)
nRBC: 0 % (ref 0.0–0.2)

## 2023-01-26 LAB — ECHOCARDIOGRAM COMPLETE
Area-P 1/2: 2.52 cm2
Height: 73 in
S' Lateral: 4.5 cm
Single Plane A2C EF: 45.9 %
Weight: 3664.93 oz

## 2023-01-26 LAB — POCT ACTIVATED CLOTTING TIME
Activated Clotting Time: 158 seconds
Activated Clotting Time: 217 seconds

## 2023-01-26 LAB — TROPONIN I (HIGH SENSITIVITY)
Troponin I (High Sensitivity): 561 ng/L (ref <18)
Troponin I (High Sensitivity): >24000 ng/L (ref <18)

## 2023-01-26 LAB — CREATININE, SERUM
Creatinine, Ser: 0.83 mg/dL (ref 0.61–1.24)
GFR, Estimated: >60 mL/min (ref >60)

## 2023-01-26 LAB — PREPARE RBC (CROSSMATCH)

## 2023-01-26 LAB — MRSA NEXT GEN BY PCR, NASAL: MRSA by PCR Next Gen: NOT DETECTED

## 2023-01-26 SURGERY — LEFT HEART CATH AND CORONARY ANGIOGRAPHY
Anesthesia: LOCAL

## 2023-01-26 MED ORDER — SODIUM CHLORIDE 0.9 % IV SOLN: 250.0000 mL | INTRAVENOUS | Status: DC | PRN

## 2023-01-26 MED ORDER — LOSARTAN POTASSIUM 50 MG PO TABS
100.0000 mg | ORAL_TABLET | Freq: Every day | ORAL | Status: DC
  Administered 2023-01-26: 100 mg via ORAL
  Filled 2023-01-26: qty 2

## 2023-01-26 MED ORDER — MIDAZOLAM HCL 2 MG/2ML IJ SOLN
INTRAMUSCULAR | Status: DC | PRN
  Administered 2023-01-26 (×2): 1 mg via INTRAVENOUS

## 2023-01-26 MED ORDER — TIROFIBAN (AGGRASTAT) BOLUS VIA INFUSION
INTRAVENOUS | Status: DC | PRN
  Administered 2023-01-26: 3220 ug via INTRAVENOUS

## 2023-01-26 MED ORDER — HEPARIN BOLUS VIA INFUSION
2000.0000 [IU] | Freq: Once | INTRAVENOUS | Status: AC
  Administered 2023-01-26: 2000 [IU] via INTRAVENOUS
  Filled 2023-01-26: qty 2000

## 2023-01-26 MED ORDER — FAMOTIDINE 20 MG PO TABS
40.0000 mg | ORAL_TABLET | Freq: Every day | ORAL | Status: DC
  Administered 2023-01-27 – 2023-01-29 (×3): 40 mg via ORAL
  Filled 2023-01-26 (×3): qty 2

## 2023-01-26 MED ORDER — HYDROCHLOROTHIAZIDE 25 MG PO TABS
25.0000 mg | ORAL_TABLET | Freq: Every day | ORAL | Status: DC
  Administered 2023-01-26: 25 mg via ORAL
  Filled 2023-01-26: qty 1

## 2023-01-26 MED ORDER — SODIUM CHLORIDE 0.9% IV SOLUTION: Freq: Once | INTRAVENOUS | Status: AC

## 2023-01-26 MED ORDER — SODIUM CHLORIDE 0.9 % IV SOLN: INTRAVENOUS | Status: AC

## 2023-01-26 MED ORDER — SODIUM CHLORIDE 0.9% FLUSH
3.0000 mL | Freq: Two times a day (BID) | INTRAVENOUS | Status: DC
  Administered 2023-01-26 – 2023-01-30 (×9): 3 mL via INTRAVENOUS

## 2023-01-26 MED ORDER — LIDOCAINE HCL (PF) 1 % IJ SOLN
INTRAMUSCULAR | Status: DC | PRN
  Administered 2023-01-26: 2 mL via INTRADERMAL
  Administered 2023-01-26: 15 mL via INTRADERMAL

## 2023-01-26 MED ORDER — ONDANSETRON HCL 4 MG/2ML IJ SOLN
4.0000 mg | Freq: Four times a day (QID) | INTRAMUSCULAR | Status: DC | PRN
  Administered 2023-01-26 (×2): 4 mg via INTRAVENOUS
  Filled 2023-01-26 (×3): qty 2

## 2023-01-26 MED ORDER — HYDROCORTISONE 1 % EX CREA: 1.0000 | TOPICAL_CREAM | CUTANEOUS | Status: DC | PRN

## 2023-01-26 MED ORDER — NOREPINEPHRINE 4 MG/250ML-% IV SOLN
INTRAVENOUS | Status: AC
  Administered 2023-01-26: 8 ug/min via INTRAVENOUS
  Filled 2023-01-26: qty 250

## 2023-01-26 MED ORDER — METOPROLOL TARTRATE 12.5 MG HALF TABLET
12.5000 mg | ORAL_TABLET | Freq: Two times a day (BID) | ORAL | Status: DC
  Administered 2023-01-26: 12.5 mg via ORAL
  Filled 2023-01-26: qty 1

## 2023-01-26 MED ORDER — HYDRALAZINE HCL 20 MG/ML IJ SOLN: 10.0000 mg | INTRAMUSCULAR | Status: AC | PRN

## 2023-01-26 MED ORDER — TIROFIBAN HCL IN NACL 5-0.9 MG/100ML-% IV SOLN
INTRAVENOUS | Status: AC
  Filled 2023-01-26: qty 100

## 2023-01-26 MED ORDER — CLOPIDOGREL BISULFATE 300 MG PO TABS
ORAL_TABLET | ORAL | Status: AC
  Filled 2023-01-26: qty 1

## 2023-01-26 MED ORDER — LIDOCAINE HCL (PF) 1 % IJ SOLN
INTRAMUSCULAR | Status: AC
  Filled 2023-01-26: qty 30

## 2023-01-26 MED ORDER — HEPARIN SODIUM (PORCINE) 5000 UNIT/ML IJ SOLN: 5000.0000 [IU] | Freq: Three times a day (TID) | INTRAMUSCULAR | Status: DC

## 2023-01-26 MED ORDER — VERAPAMIL HCL 2.5 MG/ML IV SOLN
INTRAVENOUS | Status: AC
  Filled 2023-01-26: qty 2

## 2023-01-26 MED ORDER — TIROFIBAN HCL IN NACL 5-0.9 MG/100ML-% IV SOLN
INTRAVENOUS | Status: AC | PRN
  Administered 2023-01-26: .15 ug/kg/min via INTRAVENOUS

## 2023-01-26 MED ORDER — HEPARIN SODIUM (PORCINE) 1000 UNIT/ML IJ SOLN
INTRAMUSCULAR | Status: AC
  Filled 2023-01-26: qty 10

## 2023-01-26 MED ORDER — LEVOTHYROXINE SODIUM 25 MCG PO TABS
125.0000 ug | ORAL_TABLET | Freq: Every day | ORAL | Status: DC
  Administered 2023-01-26 – 2023-01-30 (×5): 125 ug via ORAL
  Filled 2023-01-26 (×5): qty 1

## 2023-01-26 MED ORDER — IOHEXOL 350 MG/ML SOLN
INTRAVENOUS | Status: DC | PRN
  Administered 2023-01-26: 210 mL

## 2023-01-26 MED ORDER — NOREPINEPHRINE 4 MG/250ML-% IV SOLN: 0.0000 ug/min | INTRAVENOUS | Status: DC

## 2023-01-26 MED ORDER — ACETAMINOPHEN 325 MG PO TABS
650.0000 mg | ORAL_TABLET | ORAL | Status: DC | PRN
  Administered 2023-01-28 – 2023-01-30 (×3): 650 mg via ORAL
  Filled 2023-01-26 (×3): qty 2

## 2023-01-26 MED ORDER — POTASSIUM CHLORIDE CRYS ER 10 MEQ PO TBCR
10.0000 meq | EXTENDED_RELEASE_TABLET | Freq: Every day | ORAL | Status: DC
  Administered 2023-01-26 – 2023-01-30 (×5): 10 meq via ORAL
  Filled 2023-01-26 (×5): qty 1

## 2023-01-26 MED ORDER — FENTANYL CITRATE (PF) 100 MCG/2ML IJ SOLN
INTRAMUSCULAR | Status: DC | PRN
  Administered 2023-01-26 (×2): 25 ug via INTRAVENOUS

## 2023-01-26 MED ORDER — ASPIRIN 81 MG PO TBEC
81.0000 mg | DELAYED_RELEASE_TABLET | Freq: Every day | ORAL | Status: DC
  Administered 2023-01-26 – 2023-01-30 (×5): 81 mg via ORAL
  Filled 2023-01-26 (×5): qty 1

## 2023-01-26 MED ORDER — HEPARIN (PORCINE) IN NACL 1000-0.9 UT/500ML-% IV SOLN
INTRAVENOUS | Status: DC | PRN
  Administered 2023-01-26 (×2): 500 mL

## 2023-01-26 MED ORDER — CLOPIDOGREL BISULFATE 300 MG PO TABS
ORAL_TABLET | ORAL | Status: DC | PRN
  Administered 2023-01-26: 300 mg via ORAL

## 2023-01-26 MED ORDER — MIDAZOLAM HCL 2 MG/2ML IJ SOLN
INTRAMUSCULAR | Status: AC
  Filled 2023-01-26: qty 2

## 2023-01-26 MED ORDER — ALLOPURINOL 100 MG PO TABS
100.0000 mg | ORAL_TABLET | Freq: Every day | ORAL | Status: DC
  Administered 2023-01-26 – 2023-01-30 (×5): 100 mg via ORAL
  Filled 2023-01-26 (×5): qty 1

## 2023-01-26 MED ORDER — HEPARIN (PORCINE) 25000 UT/250ML-% IV SOLN
1400.0000 [IU]/h | INTRAVENOUS | Status: DC
  Administered 2023-01-26: 1400 [IU]/h via INTRAVENOUS
  Filled 2023-01-26: qty 250

## 2023-01-26 MED ORDER — VERAPAMIL HCL 2.5 MG/ML IV SOLN
INTRAVENOUS | Status: DC | PRN
  Administered 2023-01-26: 10 mL via INTRA_ARTERIAL

## 2023-01-26 MED ORDER — SODIUM CHLORIDE 0.9% FLUSH: 3.0000 mL | INTRAVENOUS | Status: DC | PRN

## 2023-01-26 MED ORDER — CHLORHEXIDINE GLUCONATE CLOTH 2 % EX PADS
6.0000 | MEDICATED_PAD | Freq: Every day | CUTANEOUS | Status: DC
  Administered 2023-01-26 – 2023-01-27 (×2): 6 via TOPICAL

## 2023-01-26 MED ORDER — SODIUM CHLORIDE 0.9 % IV SOLN
12.5000 mg | INTRAVENOUS | Status: DC | PRN
  Administered 2023-01-26: 12.5 mg via INTRAVENOUS
  Filled 2023-01-26: qty 0.5

## 2023-01-26 MED ORDER — CLOPIDOGREL BISULFATE 75 MG PO TABS
75.0000 mg | ORAL_TABLET | Freq: Every day | ORAL | Status: DC
  Administered 2023-01-27 – 2023-01-30 (×4): 75 mg via ORAL
  Filled 2023-01-26 (×4): qty 1

## 2023-01-26 MED ORDER — MAGNESIUM OXIDE -MG SUPPLEMENT 400 (240 MG) MG PO TABS
400.0000 mg | ORAL_TABLET | Freq: Every day | ORAL | Status: DC
  Administered 2023-01-26 – 2023-01-30 (×5): 400 mg via ORAL
  Filled 2023-01-26 (×5): qty 1

## 2023-01-26 MED ORDER — FENTANYL CITRATE (PF) 100 MCG/2ML IJ SOLN
INTRAMUSCULAR | Status: AC
  Filled 2023-01-26: qty 2

## 2023-01-26 MED ORDER — ROSUVASTATIN CALCIUM 20 MG PO TABS
40.0000 mg | ORAL_TABLET | Freq: Every day | ORAL | Status: DC
  Administered 2023-01-26 – 2023-01-30 (×5): 40 mg via ORAL
  Filled 2023-01-26 (×5): qty 2

## 2023-01-26 MED ORDER — SODIUM CHLORIDE 0.9 % IV SOLN: 12.5000 mg | INTRAVENOUS | Status: DC | PRN

## 2023-01-26 SURGICAL SUPPLY — 20 items
CATH EXPO 5F MPA-1 (CATHETERS) IMPLANT
CATH INFINITI 5 FR IM (CATHETERS) IMPLANT
CATH INFINITI 5FR MULTPACK ANG (CATHETERS) IMPLANT
CATH LAUNCHER 5F EBU4.0 (CATHETERS) IMPLANT
CATH OPTITORQUE TIG 4.0 5F (CATHETERS) IMPLANT
DEVICE CONTINUOUS FLUSH (MISCELLANEOUS) IMPLANT
DEVICE RAD COMP TR BAND LRG (VASCULAR PRODUCTS) IMPLANT
GLIDESHEATH SLEND SS 6F .021 (SHEATH) IMPLANT
GUIDEWIRE INQWIRE 1.5J.035X260 (WIRE) IMPLANT
INQWIRE 1.5J .035X260CM (WIRE) ×2
KIT HEART LEFT (KITS) ×1 IMPLANT
PACK CARDIAC CATHETERIZATION (CUSTOM PROCEDURE TRAY) ×1 IMPLANT
PINNACLE LONG 6F 25CM (SHEATH) ×1
SHEATH INTRO PINNACLE 6F 25CM (SHEATH) IMPLANT
SHEATH PINNACLE 6F 10CM (SHEATH) IMPLANT
SHEATH PROBE COVER 6X72 (BAG) IMPLANT
TRANSDUCER W/STOPCOCK (MISCELLANEOUS) ×1 IMPLANT
TUBING CIL FLEX 10 FLL-RA (TUBING) ×1 IMPLANT
WIRE EMERALD 3MM-J .035X150CM (WIRE) IMPLANT
WIRE MICRO SET 5FR 12 (WIRE) IMPLANT

## 2023-01-26 NOTE — Consult Note (Signed)
Vascular and Vein Specialist of [facility]  Patient name: Steven Munoz MRN: [id number] DOB: September 05, 1941 Sex: male   REQUESTING PROVIDER:    Dr. Ellyn Hack   REASON FOR CONSULT:    Right groin bleed  HISTORY OF PRESENT ILLNESS:   [name] is a 82 y.o. male, who underwent heart cath last night via a right femoral approach.  His sheath was removed this am without incident.  He then developed a warm feeling and bruising as well as a firmness.  Manual pressure was resumed. He was given a unit of blood and ultimately stabilized.  I was called for surgical evaluation   PAST MEDICAL HISTORY    Past Medical History:  Diagnosis Date   CAD (coronary artery disease) CABG  05/29/09    OPERATIVE PROCEDURES:  Median sternotomy, extracorporeal circulation,    Colonic polyp 2002, 2005, 2009   [name]   Dermatophytosis of nail    Diverticulosis    Dysmetabolic syndrome    GERD (gastroesophageal reflux disease)    S/P dilation  2005   Gout    Hyperlipidemia    Hypertension    Hypothyroidism    Murmur    Nephrolithiasis    X 1   Peripheral neuropathy 03/20/2021   Pneumonia age 69   treated as inpatient   Sleep apnea    on CPAP; Dr [name]     FAMILY HISTORY   Family History  Problem Relation Age of Onset   Hypertension Mother    Transient ischemic attack Mother    Lung cancer Mother        liver cancer, smoker, TIA   Liver cancer Mother    Heart attack Father 48   Heart attack Brother 59       smoker   Diabetes Neg Hx    Colon cancer Neg Hx    Prostate cancer Neg Hx     SOCIAL HISTORY:   Social History   Socioeconomic History   Marital status: Married    Spouse name: [name]   Number of children: 2   Years of education: Not on file   Highest education level: Not on file  Occupational History   Occupation: retired- Chiropodist: [employer]  Tobacco Use   Smoking status: Former    Packs/day: 1.00     Years: 6.00    Additional pack years: 0.00    Total pack years: 6.00    Types: Cigarettes    Quit date: 10/28/1966    Years since quitting: 56.2   Smokeless tobacco: Never   Tobacco comments:    smoked 1962- 1968, up to < 1 ppd  Vaping Use   Vaping Use: Never used  Substance and Sexual Activity   Alcohol use: Yes    Alcohol/week: 3.0 standard drinks of alcohol    Types: 3 Glasses of wine per week    Comment: socially   Drug use: No   Sexual activity: Not on file  Other Topics Concern   Not on file  Social History Narrative   Socially; lives with spouse   Right Handed   Drinks no caffeine   Social Determinants of Health   Financial Resource Strain: Low Risk  (11/14/2022)   Overall Financial Resource Strain (CARDIA)    Difficulty of Paying Living Expenses: Not hard at all  Food Insecurity: No Food Insecurity (08/07/2021)   Hunger Vital Sign    Worried About Charity fundraiser in  the Last Year: Never true    [place] in the Last Year: Never true  Transportation Needs: No Transportation Needs (02/23/2021)   PRAPARE - Hydrologist (Medical): No    Lack of Transportation (Non-Medical): No  Physical Activity: Sufficiently Active (11/14/2022)   Exercise Vital Sign    Days of Exercise per Week: 7 days    Minutes of Exercise per Session: 60 min  Stress: No Stress Concern Present (08/07/2021)   [place]    Feeling of Stress : Not at all  Social Connections: [place] (08/07/2021)   Social Connection and Isolation Panel [NHANES]    Frequency of Communication with Friends and Family: More than three times a week    Frequency of Social Gatherings with Friends and Family: More than three times a week    Attends Religious Services: More than 4 times per year    Active Member of [employer] or Organizations: Yes    Attends Music therapist: More than 4 times per year     Marital Status: Married  Human resources officer Violence: Not At Risk (08/07/2021)   Humiliation, Afraid, Rape, and Kick questionnaire    Fear of Current or Ex-Partner: No    Emotionally Abused: No    Physically Abused: No    Sexually Abused: No    ALLERGIES:    Allergies  Allergen Reactions   Povidone-Iodine     REACTION: rash locally   Ofloxacin     Affected sleep with eye drop formulation   Tape Rash    Cloth adhesive tape-per patient Cloth adhesive tape-per patient Cloth adhesive tape-per patient    CURRENT MEDICATIONS:    Current Facility-Administered Medications  Medication Dose Route Frequency Provider Last Rate Last Admin   0.9 %  sodium chloride infusion (Manually program via Guardrails IV Fluids)   Intravenous Once Sabharwal, Aditya, DO       0.9 %  sodium chloride infusion  250 mL Intravenous PRN Leonie Man, MD       acetaminophen (TYLENOL) tablet 650 mg  650 mg Oral Q4H PRN Leonie Man, MD       allopurinol (ZYLOPRIM) tablet 100 mg  100 mg Oral Daily Leonie Man, MD   100 mg at 01/26/23 [id number]   aspirin EC tablet 81 mg  81 mg Oral Daily Leonie Man, MD   81 mg at 01/26/23 [id number]   Chlorhexidine Gluconate Cloth 2 % PADS 6 each  6 each Topical Daily Leonie Man, MD       [START ON 01/27/2023] clopidogrel (PLAVIX) tablet 75 mg  75 mg Oral Q breakfast Leonie Man, MD       famotidine (PEPCID) tablet 40 mg  40 mg Oral QHS Leonie Man, MD       heparin injection 5,000 Units  5,000 Units Subcutaneous Q8H Leonie Man, MD       hydrochlorothiazide (HYDRODIURIL) tablet 25 mg  25 mg Oral Daily Leonie Man, MD   25 mg at 01/26/23 [id number]   hydrocortisone cream 1 % 1 Application  1 Application Topical PRN Leonie Man, MD       levothyroxine (SYNTHROID) tablet 125 mcg  125 mcg Oral Q0600 Leonie Man, MD   125 mcg at 01/26/23 0559   losartan (COZAAR) tablet 100 mg  100 mg Oral Daily Leonie Man, MD   100 mg at 01/26/23  [id number]    magnesium oxide (MAG-OX) tablet 400 mg  400 mg Oral Daily Leonie Man, MD   400 mg at 01/26/23 [id number]   metoprolol tartrate (LOPRESSOR) tablet 12.5 mg  12.5 mg Oral BID Leonie Man, MD   12.5 mg at 01/26/23 [id number]   norepinephrine (LEVOPHED) 4-5 MG/250ML-% infusion SOLN            ondansetron (ZOFRAN) injection 4 mg  4 mg Intravenous Q6H PRN Leonie Man, MD       potassium chloride (KLOR-CON M) CR tablet 10 mEq  10 mEq Oral Daily Leonie Man, MD   10 mEq at 01/26/23 [id number]   rosuvastatin (CRESTOR) tablet 40 mg  40 mg Oral Daily Leonie Man, MD   40 mg at 01/26/23 [id number]   sodium chloride flush (NS) 0.9 % injection 3 mL  3 mL Intravenous Q12H Leonie Man, MD   3 mL at 01/26/23 0641   sodium chloride flush (NS) 0.9 % injection 3 mL  3 mL Intravenous PRN Leonie Man, MD        REVIEW OF SYSTEMS:   [X]  denotes positive finding, [ ]  denotes negative finding Cardiac  Comments:  Chest pain or chest pressure:    Shortness of breath upon exertion:    Short of breath when lying flat:    Irregular heart rhythm:        Vascular    Pain in calf, thigh, or hip brought on by ambulation:    Pain in feet at night that wakes you up from your sleep:     Blood clot in your veins:    Leg swelling:         Pulmonary    Oxygen at home:    Productive cough:     Wheezing:         Neurologic    Sudden weakness in arms or legs:     Sudden numbness in arms or legs:     Sudden onset of difficulty speaking or slurred speech:    Temporary loss of vision in one eye:     Problems with dizziness:         Gastrointestinal    Blood in stool:      Vomited blood:         Genitourinary    Burning when urinating:     Blood in urine:        Psychiatric    Major depression:         Hematologic    Bleeding problems:    Problems with blood clotting too easily:        Skin    Rashes or ulcers:        Constitutional    Fever or chills:     PHYSICAL EXAM:   Vitals:    01/26/23 1030 01/26/23 1100 01/26/23 1130 01/26/23 1200  BP: 123/71 (!) 112/55 133/71 (!) 122/56  Pulse: 63 60 60 (!) 54  Resp: (!) 24 16 17 15   Temp:      TempSrc:      SpO2: 93% 96% 96% 97%  Weight:      Height:        GENERAL: The patient is a well-nourished male, in no acute distress. The vital signs are documented above. CARDIAC: There is a regular rate and rhythm.  VASCULAR: right groin tender with ecchymosis and firmness PULMONARY: Nonlabored respirations ABDOMEN: Soft and non-tender  MUSCULOSKELETAL: There are no major deformities  or cyanosis. NEUROLOGIC: No focal weakness or paresthesias are detected. SKIN: There are no ulcers or rashes noted. PSYCHIATRIC: The patient has a normal affect.  STUDIES:   U/s shows no active bleeding or pseudoaneurysm, just a large hematoma  ASSESSMENT and PLAN   Right groin bleed:  When I arrived, manual pressure was being held and the patient has stabilized.  I ordered a duplex which I was present for.  There is no active bleeding or pseudoaneurysm.  Continue with bed rest and BP control.  No need for surgical exploration.   Leia Alf, MD, FACS [facility] and Vein Specialists of [facility] [phone] Pager [phone]

## 2023-01-26 NOTE — Progress Notes (Signed)
VASCULAR LAB    Right groin duplex has been performed.  See CV proc for preliminary results.   Ercel Normoyle, RVT 01/26/2023, 3:32 PM

## 2023-01-26 NOTE — Progress Notes (Signed)
  Echocardiogram 2D Echocardiogram has been performed.  Steven Munoz 01/26/2023, 11:48 AM

## 2023-01-26 NOTE — Progress Notes (Signed)
Notified by nursing staff about sudden in drop in MAP to 50s and HR to 40s-50s; on exam, large hematoma with ecchymosis near ateriotomy. Manual pressure held for 35-40 minutes at femoral artery. During this time patient initially became hypotensives to 60s/40s with HR of 40. Administered atropine, IVF bolus and 1UPRBC. Vascular surgery and interventional notified. After holding pressures and above interventions, improvement in SBP to 115-120.   CC time 40 mins.   Jeris Roser 2:21 PM

## 2023-01-26 NOTE — Progress Notes (Signed)
59fr long sheath aspirated and removed from right femoral artery. Manual pressure applied for 35 minutes. Site level 0, no s+s of hematoma. Tegaderm dressing applied, bedrest instructions given.   There is a 2 inch skin tear present proximally to the sheath site, it was present post cath.   Right dp and pt pulses palpable.  Bedrest begins at 12:15:00

## 2023-01-26 NOTE — ED Notes (Signed)
Patient transported to Cath Lab with a RN on monitor and hooked up to the [place].

## 2023-01-26 NOTE — Progress Notes (Signed)
Chaplain responded to Code page, pt was alert and willing to visit, his wife Steven Munoz was at his bedside. I provided reflective listening, prayer and orientation to chaplain services. No further assessment made and no follow up needed at this time.  [address], [place]   01/25/23 2315  Spiritual Encounters  Type of Visit Initial  Care provided to: Patient;Family  Conversation partners present during encounter Nurse  Referral source Code page  Reason for visit Code  OnCall Visit Yes  Spiritual Framework  Presenting Themes Impactful experiences and emotions  Community/Connection Family  Patient Stress Factors Health changes  Interventions  Spiritual Care Interventions Made Established relationship of care and support;Compassionate presence;Reflective listening;Prayer  Intervention Outcomes  Outcomes Connection to spiritual care;Awareness around self/spiritual resourses;Awareness of support;Reduced anxiety

## 2023-01-26 NOTE — ED Notes (Signed)
EDP notified of Critical Lab values.   Troponin 561

## 2023-01-26 NOTE — Progress Notes (Addendum)
Progress Note  Patient Name: Steven Munoz Date of Encounter: 01/26/2023  Primary Cardiologist: Jenkins Rouge, MD  Subjective   Patient had acute STEMI last night, she underwent LHC that showed culprit lesion in the anastomosis of the SVG to PDA, very difficult high-graft to access. Placed on Aggrastat and he will be reevaluated next week for resolution of thrombus. Denies any chest pain after the procedure. EKG after procedure/this morning showed resolution of ST elevations.  Inpatient Medications    Scheduled Meds:  allopurinol  100 mg Oral Daily   aspirin EC  81 mg Oral Daily   Chlorhexidine Gluconate Cloth  6 each Topical Daily   [START ON 01/27/2023] clopidogrel  75 mg Oral Q breakfast   famotidine  40 mg Oral QHS   heparin  5,000 Units Subcutaneous Q8H   hydrochlorothiazide  25 mg Oral Daily   levothyroxine  125 mcg Oral Q0600   losartan  100 mg Oral Daily   magnesium oxide  400 mg Oral Daily   metoprolol tartrate  12.5 mg Oral BID   potassium chloride  10 mEq Oral Daily   rosuvastatin  40 mg Oral Daily   sodium chloride flush  3 mL Intravenous Q12H   Continuous Infusions:  sodium chloride 125 mL/hr at 01/26/23 0600   sodium chloride     PRN Meds: sodium chloride, acetaminophen, hydrocortisone cream, ondansetron (ZOFRAN) IV, sodium chloride flush   Vital Signs    Vitals:   01/26/23 0748 01/26/23 0800 01/26/23 0830 01/26/23 0900  BP:  (!) 115/58 123/76 115/64  Pulse:  71 70 64  Resp:  20 15 (!) 22  Temp: 98 F (36.7 C)     TempSrc: Oral     SpO2:  95% 97% 94%  Weight:      Height:        Intake/Output Summary (Last 24 hours) at 01/26/2023 0950 Last data filed at 01/26/2023 0600 Gross per 24 hour  Intake 491.8 ml  Output 500 ml  Net -8.2 ml   Filed Weights   01/25/23 2331 01/26/23 0305  Weight: 128.8 kg 103.9 kg    Telemetry     Personally reviewed, NSR, frequent PVCs  ECG    Received [facility] elevations today with frequent PVCs  Physical Exam    GEN: No acute distress.   Neck: No JVD. Cardiac: RRR, no murmur, rub, or gallop.  Respiratory: Nonlabored. Clear to auscultation bilaterally. GI: Soft, nontender, bowel sounds present. MS: No edema; No deformity. Neuro:  Nonfocal. Psych: Alert and oriented x 3. Normal affect.  Labs    Chemistry Recent Labs  Lab 01/25/23 2320 01/26/23 0328  NA 136  --   K 3.5  --   CL 103  --   CO2 24  --   GLUCOSE 142*  --   BUN 21  --   CREATININE 0.96 0.83  CALCIUM 9.3  --   GFRNONAA >60 >60  ANIONGAP 9  --      Hematology Recent Labs  Lab 01/25/23 2320 01/26/23 0328  WBC 7.4 8.7  RBC 4.64 4.26  HGB 15.2 13.9  HCT 43.5 39.3  MCV 93.8 92.3  MCH 32.8 32.6  MCHC 34.9 35.4  RDW 12.7 12.6  PLT 144* 141*    Cardiac Enzymes Recent Labs  Lab 01/25/23 2320 01/26/23 0328  TROPONINIHS 561* >24,000*    BNPNo results for input(s): "BNP", "PROBNP" in the last 168 hours.   DDimerNo results for input(s): "DDIMER" in the last 168  hours.   Radiology    CARDIAC CATHETERIZATION  Result Date: 01/26/2023   Mid LM to Ost LAD lesion is 20% stenosed with 20% stenosed side branch in Ost Cx.   Ost LAD to Prox LAD lesion is 75% stenosed.  Prox LAD to Mid LAD lesion is 95% stenosed with 99% stenosed side branch in 1st Diag. Mid LAD lesion is 100% stenosed.  2nd Diag lesion is 80% stenosed.   Prox RCA lesion is 60% stenosed.  Mid RCA to Dist RCA lesion is 100% stenosed.   LIMA-LAD graft was visualized by angiography and is normal in caliber and moderate in size.  The graft exhibits no disease.   SVG-dRCA (PDA-PAV bifurcation) graft was visualized by angiography and is large.  The graft exhibits minimal luminal irregularities.   CULPRIT LESION: RPDA lesion is 60% stenosed. ->  Thrombotic stenosis, unable to really determine initial lesion and vessel size.   There is mild left ventricular systolic dysfunction.  The left ventricular ejection fraction is 45-50% by visual estimate.  LV end diastolic  pressure is normal.   There is no aortic valve stenosis. =================================================================== POST-OPERATIVE DIAGNOSIS:  Culprit lesion is thrombotic 90% occlusion in the anastomosis of the SVG-PDA (the graft actually anastomosis at the bifurcation of the PL and PDA and the distal RCA.  There is thrombus in the PDA branch but not in the PL branch with TIMI-3 flow => very difficult high graft to access.  Plan was to treat with Aggrastat and reevaluate early next week. Native RCA has extensive 60% proximal to mid stenosis and is occluded in the distal vessel. Native the left main has distal 20% stenosis branches into the LAD and LCx.  LCx courses as a large caliber bifurcating OM branch with a AV groove branch that is free of disease.  The ostial LAD has 80% stenosis then has severe 90+ percent stenosis and a small diagonal just after SP1 it is then 100% occluded shortly after this branch. LIMA to LAD is widely patent with antegrade flow down to the apex and retrograde flow back up to a small diagonal branch with trivial flow. Normal LVEDP of 12 mmHg.  Mild inferior hypokinesis but very poor visualization.  Recommend 2D echocardiogram.  Otherwise EF appears to be maybe 45 to 50%. PLAN OF CARE: Admit to inpatient  -> run Aggrastat to discontinue at 0830 hrs., pull sheath 2 hours later.=> Reassess EKG and symptoms, would probably consider relook catheterization on Monday to reassess the anastomotic lesion. Check 2D echo Increase statin to 40 mg rosuvastatin, increase beta-blocker to metoprolol titrate 12.5 mg twice daily   DG Chest Port 1 View  Result Date: 01/25/2023 CLINICAL DATA:  Chest pain. EXAM: PORTABLE CHEST 1 VIEW COMPARISON:  Chest radiograph dated 09/03/2022. FINDINGS: Mild eventration of the right hemidiaphragm. Minimal left lung base atelectasis. No focal consolidation, pleural effusion, or pneumothorax. The cardiac silhouette is within normal limits. Median sternotomy  wires. No acute osseous pathology. IMPRESSION: No active disease. Electronically Signed   By: Anner Crete M.D.   On: 01/25/2023 23:53    Cardiac Studies   Echo pending  Assessment & Plan    Patient is a 82 year old M known to have CAD s/p CABG in 2010 (LIMA to LAD, SVG to distal RCA) with normal LVEF, HTN, hypothyroidism, HLD, OSA is currently admitted to the cardiology service for management of STEMI.  # STEMI on 01/25/2023 # CAD s/p CABG in 2010 (LIMA to LAD, SVG to distal RCA) with normal  LVEF, currently angina free -LHC on 01/26/2023 showed thrombus in the SVG to PDA but very difficult hide graft to access. Has been on Aggrastat which was discontinued this morning at 8:30. Left radial TR band and right groin sheath will be removed 2 hours after stopping Aggrastat (minimal oozing from L radial TR band site). Nurse aware, patient requested Cath Lab nurse to remove the groin sheath. No systemic anticoagulation for the next 24 hours. -EKG showed resolution of ST elevations and patient chest pain free. Will keep n.p.o. from midnight tentatively for [facility] tomorrow day after (per LHC note). Risks and benefits of cardiac catheterization have been discussed with the patient. These include bleeding, infection, kidney damage, stroke, heart attack, death. The patient understands these risks and is willing to proceed. -Continue aspirin 81 mg once daily and Plavix 75 mg once daily -Continue rosuvastatin 40 mg nightly -Continue metoprolol tartrate 12.5 mg twice daily and losartan 100 mg once daily -Echocardiogram pending, LV gram showed LVEF 45%  # Frequent PVCs -Continue metoprolol tartrate 12.5 mg twice daily  # HLD: Continue rosuvastatin 40 mg nightly. Goal LDL less than 70.  I have spent a total of 30 minutes with patient reviewing chart , telemetry, EKGs, labs and examining patient as well as establishing an assessment and plan that was discussed with the patient.  > 50% of time was spent in  direct patient care.     Signed, Chalmers Guest, MD  01/26/2023, 9:50 AM

## 2023-01-26 NOTE — H&P (Signed)
Cardiology Admission History and Physical   Patient ID: Steven Munoz MRN: [id number]; DOB: 18-May-1941   Admission date: 01/25/2023  PCP:  Colon Branch, [place] [facility] Cardiologist:  Jenkins Rouge, MD        Chief Complaint:  Chest pain  Patient Profile:   Steven Munoz is a 82 y.o. male with coronary artery disease status post coronary artery bypass surgery in 2012 (LIMA to LAD; SVG sequential to PDA and PLB), hypertension, hypercholesteremia, hypothyroidism, polyneuropathy, and sleep apnea who is being seen 01/26/2023 for the evaluation of chest pain.  History of Present Illness:   Steven Munoz states after waking up from sleep while seated watching a basketball game he stretched and then immediately noticed an ache pain at the center of his chest.  The pain did not radiate to any other location or associated with any other symptoms such as shortness of breath, nausea, sweating, palpitations, or lightheadedness.  He checked his blood pressure and noticed that the systolic pressure was elevated in the 160s and eventually 180s.   His chest pain did resolve after 10-15 minutes.  He decided to have his wife drive him to our emergency department mostly due to his blood pressure.  At this time, patient is chest pain free.  Patient reports he took his aspirin this evening.  In the emergency department his first ECG revealed ST elevations in the inferior leads with depressions in the inferior leads, mild ST elevation in the anterior leads.  Initial HS-troponin was 561 ng/L. In the emergency department, patient was eventually loaded with high-dose aspirin and given a heparin bolus.  Code STEMI was called.        Past Medical History:  Diagnosis Date   CAD (coronary artery disease) CABG  05/29/09    OPERATIVE PROCEDURES:  Median sternotomy, extracorporeal circulation,    Colonic polyp 2002, 2005, 2009   [name]   Dermatophytosis of nail    Diverticulosis     Dysmetabolic syndrome    GERD (gastroesophageal reflux disease)    S/P dilation  2005   Gout    Hyperlipidemia    Hypertension    Hypothyroidism    Murmur    Nephrolithiasis    X 1   Peripheral neuropathy 03/20/2021   Pneumonia age 74   treated as inpatient   Sleep apnea    on CPAP; Dr [name]  OPERATIVE PROCEDURES:  Median sternotomy, extracorporeal circulation,  coronary bypass graft surgery x3 using a left internal mammary artery  graft to left anterior descending coronary artery, with a sequential  saphenous vein graft to posterior descending and posterolateral branches  of the right coronary artery.  Endoscopic vein harvesting from the right  leg.  Past Surgical History:  Procedure Laterality Date   athroscopy     right knee X2, left knee X1   COLONOSCOPY  2002 & 2009 &2014   polypectomy; [name]   CORONARY ARTERY BYPASS GRAFT  2010   Median sternotomy, extracorporeal circulation, coronary bypass graft surgery x3 using a left internal mammary artery graft to left anterior descending coronary artery, with a sequential  saphenous vein graft to posterior descending and posterolateral branches  of the right coronary artery.  Endoscopic vein harvesting from the right   EYE SURGERY Right    tear duct   Nail Alvusion Bilateral    Hallux   NOSE SURGERY  11/20/12   L max antrectomy;septoplasty;turbinate reduction. [name]  REPLACEMENT TOTAL KNEE     right 2003   REPLACEMENT TOTAL KNEE     partial Left March 2009   TENDON RELEASE Right 03/2016   from R hip. @ [facility]  02/22/2014   right hip; Dr [facility]     Medications Prior to Admission: Prior to Admission medications   Medication Sig Start Date End Date Taking? Authorizing Provider  allopurinol (ZYLOPRIM) 100 MG tablet Take 1 tablet (100 mg total) by mouth daily. 11/04/22   Collier Salina, MD  aspirin EC 81 MG tablet Take 81 mg by mouth daily.    [provider]   Cholecalciferol (VITAMIN D-3) 125 MCG (5000 UT) TABS Take by mouth.    [provider]  Coenzyme Q10 (COQ10) 200 MG CAPS Take 200 mg by mouth daily.    [provider]  colchicine 0.6 MG tablet Take 1 tablet (0.6 mg total) by mouth 2 (two) times daily as needed. 03/20/21   Colon Branch, MD  colestipol (COLESTID) 1 g tablet Take 1 g by mouth daily. 06/12/22   [provider]  cyanocobalamin (VITAMIN B12) 1000 MCG tablet Take 2,000 mcg by mouth daily.    [provider]  diphenoxylate-atropine (LOMOTIL) 2.5-0.025 MG tablet Take by mouth as needed for diarrhea or loose stools.    [provider]  famotidine (PEPCID) 40 MG tablet Take 40 mg by mouth at bedtime.    [provider]  hydrochlorothiazide (HYDRODIURIL) 25 MG tablet Take 1 tablet (25 mg total) by mouth daily. 07/29/22   Colon Branch, MD  hydrocortisone 2.5 % cream Apply 1 application topically as needed. 12/28/20   [provider]  levothyroxine (SYNTHROID) 125 MCG tablet Take 1 tablet (125 mcg total) by mouth daily before breakfast. 07/11/22   Colon Branch, MD  losartan (COZAAR) 100 MG tablet Take 1 tablet (100 mg total) by mouth daily. 07/29/22   Colon Branch, MD  Magnesium 400 MG CAPS Take 400 mg by mouth daily.    [provider]  metoprolol tartrate (LOPRESSOR) 25 MG tablet TAKE 1/2 TABLET DAILY      Anson Fret MFR* 11/26/22   Colon Branch, MD  Multiple Vitamins-Minerals (CENTRUM SILVER PO) Take 1 tablet by mouth daily. Patient not taking: Reported on 12/16/2022    [provider]  Multiple Vitamins-Minerals (PRESERVISION AREDS 2+MULTI VIT) CAPS Take 1 capsule by mouth 2 (two) times daily.     [provider]  Omega-3 Fatty Acids (OMEGA 3 500 PO) Take 1,000 mg by mouth daily. Has 1000mg  capsules    [provider]  potassium chloride (KLOR-CON) 10 MEQ tablet Take 1 tablet (10 mEq total) by mouth daily. 01/07/23   Colon Branch, MD  rosuvastatin (CRESTOR) 20  MG tablet TAKE 1 TABLET DAILY 09/21/22   Colon Branch, MD  sodium chloride (OCEAN) 0.65 % nasal spray Saline Nasal 0.65 % spray aerosol  Take by nasal route.    [provider]     Allergies:    Allergies  Allergen Reactions   Povidone-Iodine     REACTION: rash locally   Ofloxacin     Affected sleep with eye drop formulation   Tape Rash    Cloth adhesive tape-per patient Cloth adhesive tape-per patient Cloth adhesive tape-per patient    Social History:   Social History   Socioeconomic History   Marital status: Married    Spouse name: [name]   Number of children:  2   Years of education: Not on file   Highest education level: Not on file  Occupational History   Occupation: retired- Chiropodist: [employer]  Tobacco Use   Smoking status: Former    Packs/day: 1.00    Years: 6.00    Additional pack years: 0.00    Total pack years: 6.00    Types: Cigarettes    Quit date: 10/28/1966    Years since quitting: 56.2   Smokeless tobacco: Never   Tobacco comments:    smoked [place], up to < 1 ppd  Vaping Use   Vaping Use: Never used  Substance and Sexual Activity   Alcohol use: Yes    Alcohol/week: 3.0 standard drinks of alcohol    Types: 3 Glasses of wine per week    Comment: socially   Drug use: No   Sexual activity: Not on file  Other Topics Concern   Not on file  Social History Narrative   Socially; lives with spouse   Right Handed   Drinks no caffeine   Social Determinants of Health   Financial Resource Strain: Low Risk  (11/14/2022)   Overall Financial Resource Strain (CARDIA)    Difficulty of Paying Living Expenses: Not hard at all  Food Insecurity: No Food Insecurity (08/07/2021)   Hunger Vital Sign    Worried About Running Out of Food in the Last Year: Never true    Ran Out of Food in the Last Year: Never true  Transportation Needs: No Transportation Needs (02/23/2021)   PRAPARE - Radiographer, therapeutic (Medical): No    Lack of Transportation (Non-Medical): No  Physical Activity: Sufficiently Active (11/14/2022)   Exercise Vital Sign    Days of Exercise per Week: 7 days    Minutes of Exercise per Session: 60 min  Stress: No Stress Concern Present (08/07/2021)   [place]    Feeling of Stress : Not at all  Social Connections: [place] (08/07/2021)   Social Connection and Isolation Panel [NHANES]    Frequency of Communication with Friends and Family: More than three times a week    Frequency of Social Gatherings with Friends and Family: More than three times a week    Attends Religious Services: More than 4 times per year    Active Member of [employer] or Organizations: Yes    Attends Music therapist: More than 4 times per year    Marital Status: Married  Human resources officer Violence: Not At Risk (08/07/2021)   Humiliation, Afraid, Rape, and Kick questionnaire    Fear of Current or Ex-Partner: No    Emotionally Abused: No    Physically Abused: No    Sexually Abused: No    Family History:   The patient's family history includes Heart attack (age of onset: 40) in his brother; Heart attack (age of onset: 33) in his father; Hypertension in his mother; Liver cancer in his mother; Lung cancer in his mother; Transient ischemic attack in his mother. There is no history of Diabetes, Colon cancer, or Prostate cancer.    ROS:  Please see the history of present illness.  All other ROS reviewed and negative.     Physical Exam/Data:   Vitals:   01/25/23 2345 01/25/23 2358 01/26/23 0000 01/26/23 0015  BP: (!) 162/139 (!) 162/139 (!) 173/78 (!) 134/54  Pulse: 76  78 79  Resp: 19  17 15  Temp:      SpO2: 98%  90% 97%  Weight:      Height:        Intake/Output Summary (Last 24 hours) at 01/26/2023 0054 Last data filed at 01/26/2023 0002 Gross per 24 hour  Intake --  Output 100 ml  Net -100 ml       01/25/2023   11:31 PM 12/16/2022   10:14 AM 11/25/2022    3:08 PM  Last 3 Weights  Weight (lbs) 284 lb 284 lb 4 oz 286 lb 3.2 oz  Weight (kg) 128.822 kg 128.935 kg 129.819 kg     Body mass index is 37.47 kg/m.  General:  Well nourished, well developed, in no acute distress HEENT: normal Neck: no JVD Vascular: No carotid bruits; Distal pulses 2+ bilaterally   Cardiac:  normal S1, S2; RRR; 2/6 holosystolic murmur best LUSB with radiation throughout.  No gallops/rubs. Lungs:  clear to auscultation bilaterally, no wheezing, rhonchi or rales  Abd: soft, nontender, no hepatomegaly  Ext: no edema Musculoskeletal:  No deformities, BUE and BLE strength normal and equal Skin: warm and dry  Neuro:  CNs 2-12 intact, no focal abnormalities noted Psych:  Normal affect    EKG:  The ECG that was done  was personally reviewed and demonstrates: NSR; ST elevations in the inferior leads; ST depressions lateral leads; possible recipricol changes in the anterior leads  Relevant CV Studies: # Echocardiogram 06/18/22: IMPRESSIONS   1. Left ventricular ejection fraction, by estimation, is 60 to 65%. The  left ventricle has normal function. The left ventricle has no regional  wall motion abnormalities. Left ventricular diastolic parameters were  normal.   2. Right ventricular systolic function is normal. The right ventricular  size is mildly enlarged.   3. Left atrial size was mildly dilated.   4. The mitral valve is degenerative. Trivial mitral valve regurgitation.  Mild mitral stenosis.   5. The aortic valve is tricuspid. Aortic valve regurgitation is not  visualized. Mild aortic valve stenosis. Aortic valve mean gradient  measures 10.0 mmHg. Aortic valve Vmax measures 2.16 m/s.   6. The inferior vena cava is normal in size with greater than 50%  respiratory variability, suggesting right atrial pressure of 3 mmHg.   Laboratory Data:  High Sensitivity Troponin:   Recent Labs  Lab  01/25/23 2320  TROPONINIHS 561*      Chemistry Recent Labs  Lab 01/25/23 2320  NA 136  K 3.5  CL 103  CO2 24  GLUCOSE 142*  BUN 21  CREATININE 0.96  CALCIUM 9.3  GFRNONAA >60  ANIONGAP 9    No results for input(s): "PROT", "ALBUMIN", "AST", "ALT", "ALKPHOS", "BILITOT" in the last 168 hours. Lipids No results for input(s): "CHOL", "TRIG", "HDL", "LABVLDL", "LDLCALC", "CHOLHDL" in the last 168 hours. Hematology Recent Labs  Lab 01/25/23 2320  WBC 7.4  RBC 4.64  HGB 15.2  HCT 43.5  MCV 93.8  MCH 32.8  MCHC 34.9  RDW 12.7  PLT 144*   Thyroid No results for input(s): "TSH", "FREET4" in the last 168 hours. BNPNo results for input(s): "BNP", "PROBNP" in the last 168 hours.  DDimer No results for input(s): "DDIMER" in the last 168 hours.   Radiology/Studies:  [facility] Chest Port 1 View  Result Date: 01/25/2023 CLINICAL DATA:  Chest pain. EXAM: PORTABLE CHEST 1 VIEW COMPARISON:  Chest radiograph dated 09/03/2022. FINDINGS: Mild eventration of the right hemidiaphragm. Minimal left lung base atelectasis. No focal consolidation, pleural  effusion, or pneumothorax. The cardiac silhouette is within normal limits. Median sternotomy wires. No acute osseous pathology. IMPRESSION: No active disease. Electronically Signed   By: Anner Crete M.D.   On: 01/25/2023 23:53     Assessment and Plan:   Chest pain: ECG consistent with STEMI given new ECG changes.  Currently chest pain free and hemodynamically stable, though elevated blood pressure.  Unlikely hypertensive emergency/urgency.  Patient given high-dose aspirin and heparin bolus.  STEMI team notified. --Patient to undergo emergent coronary angiography. --Interventionalist to place additional orders post coronary angiography.  Hypertension: Primary hypertension without overt end-organ damage.  Blood pressure initially elevated now improving.  Patient on appropriate co-morbidity congruent medications at home.   --Restart home  medications.  3. Coronary artery disease: Known multivessel coronary artery disease status post coronary artery bypass surgery.  Here with chest pain concerning for STEMI.  On guideline directed medical prior.   --Management as above.   Risk Assessment/Risk Scores:    TIMI Risk Score for ST  Elevation MI:   The patient's TIMI risk score is 4, which indicates a 7.3% risk of all cause mortality at 30 days.        Severity of Illness: The appropriate patient status for this patient is INPATIENT. Inpatient status is judged to be reasonable and necessary in order to provide the required intensity of service to ensure the patient's safety. The patient's presenting symptoms, physical exam findings, and initial radiographic and laboratory data in the context of their chronic comorbidities is felt to place them at high risk for further clinical deterioration. Furthermore, it is not anticipated that the patient will be medically stable for discharge from the hospital within 2 midnights of admission.   * I certify that at the point of admission it is my clinical judgment that the patient will require inpatient hospital care spanning beyond 2 midnights from the point of admission due to high intensity of service, high risk for further deterioration and high frequency of surveillance required.*   For questions or updates, please contact [place] Please consult [email or web address] for contact info under     Signed, Jon Billings, MD  01/26/2023 12:54 AM

## 2023-01-26 NOTE — Brief Op Note (Signed)
BRIEF CARDIAC CATHETERIZATION REPORT  01/26/2023  2:54 AM  PATIENT NAME: Steven Munoz   MRN: [id number]  PCP:  Colon Branch, MD [place] Cardiologist:  Jenkins Rouge, MD        PROCEDURE:  Procedure(s): Coronary/Graft Acute MI Revascularization (N/A)  SURGEON:  Surgeon(s) and Role: Leonie Man, MD - Primary  PATIENT:  Steven Munoz is a 82 y.o. male with coronary artery disease status post coronary artery bypass surgery in 2012 (LIMA to LAD; SVG -to PDA and PLB), hypertension, hypercholesteremia, hypothyroidism, polyneuropathy, and sleep apnea who was initially evaluated in the [facility] at roughly 11:15 PM 01/25/2023 after presenting with substernal chest pain.  Initial EKG showed inferior ST elevations.  Shortly after arrival to the ER he became chest pain-free and EKG improved dramatically.  With the inferior ST elevations, the plan was still to proceed with cardiac catheterization.  This was delayed because of 2 prior activations of the Cath Lab team for STEMI and cardiogenic shock making it 3 simultaneous activations.  Since he was most able of the 3, without chest pain, he was delayed, to the Cath Lab by over an hour.  PRE-OPERATIVE DIAGNOSIS:  Acute Inferior STEMI  POST-OPERATIVE DIAGNOSIS:  * Culprit lesion is thrombotic 90% occlusion in the anastomosis of the SVG-PDA (the graft actually anastomosis at the bifurcation of the PL and PDA and the distal RCA.  There is thrombus in the PDA branch but not in the PL branch with TIMI-3 flow => very difficult high graft to access.  Plan was to treat with Aggrastat and reevaluate early next week. Native RCA has extensive 60% proximal to mid stenosis and is occluded in the distal vessel. Native the left main has distal 20% stenosis branches into the LAD and LCx.  LCx courses as a large caliber bifurcating OM branch with a AV groove branch that is free of disease.  The ostial LAD has 80% stenosis then has  severe 90+ percent stenosis and a small diagonal just after SP1 it is then 100% occluded shortly after this branch. LIMA to LAD is widely patent with antegrade flow down to the apex and retrograde flow back up to a small diagonal branch with trivial flow. Normal LVEDP of 12 mmHg.  Mild inferior hypokinesis but very poor visualization.  Recommend 2D echocardiogram.  Otherwise EF appears to be maybe 45 to 50%.   PROCEDURE PERFORMED Time Out: Verified patient identification, verified procedure, site/side was marked, verified correct patient position, special equipment/implants available, medications/allergies/relevent history reviewed, required imaging and test results available. Performed.  Access:  LEFT Radial Artery: 6 Fr Glide sheath -- Seldinger technique using short Micropuncture needle -- Direct ultrasound guidance used.  Permanent image obtained and placed on chart. -- 10 mL radial cocktail IA; 6000 Units IV Heparin =--> With catheter removal, the initial sheath pulled out, this was then exchanged over the wire for a standard 6 French sheath RIGHT Common Femoral Artery: 6 Fr Long Sheath - fluoroscopically guided modified Seldinger Technique, using Micropuncture Kit -- Direct ultrasound guidance used.  Permanent image obtained and placed on chart.  Left Heart Catheterization: 5 and 6 Fr Catheters advanced or exchanged over a J-wire under direct fluoroscopic guidance into the ascending aorta; JR4 catheter advanced first.  LV Hemodynamics (LV Gram): Via femoral access, JR4 catheter Left Coronary Artery Cineangiography: Via femoral access, JL 4 catheter  Right Coronary Artery & SVG-dRCA  Cineangiography: Via left radial access, JR4 catheter  SVG-dRCA  Cineangiography: Via left radial access, MPA1 catheter  LIMA-LAD Cineangiography: JR4 Catheter was redirected into Left Subclavian Artery & exchanged over long-exchange wire for IMA catheter. Multiple different catheters were tried to advance via  the left radial access into the aortic root to engage the left coronary artery, but were unsuccessful.  We therefore decided to convert from radial to femoral access.  Review of initial angiography revealed: Anastomotic thrombotic lesion in the SVG-dRCA  Preparations are made for IV Aggrastat infusion for what would be essentially 8 hours after bolus  Upon completion of Angiogaphy, the catheter was removed completely out of the body over a wire, without complication.  6 French long femoral Sheath(s) was sutured in place.  It will be removed in the CVICU with manual pressure for hemostasis 2 hours after discontinuation of Aggrastat.    Radial sheath removed in the Cardiac Catheterization lab with TR Band placed for hemostasis.  TR Band: [id number]  Hours; 12 mL air; reverse Barbeau C  MEDICATIONS SQ Lidocaine 3 mL Radial Cocktail: 3 mg Verapmil in 10 mL NS Heparin: 6000 units IV Aggrastat bolus and drip   ANESTHESIA:   local and IV sedation -> 2 mg Versed, 50 mcg fentanyl  EBL:  < 50 mL   DICTATION: .Note written in EPIC  PLAN OF CARE: Admit to inpatient  -> run Aggrastat to discontinue at 0830 hrs., pull sheath 2 hours later.=> Reassess EKG and symptoms, would probably consider relook catheterization on Monday to reassess the anastomotic lesion. Check 2D echo Increase statin to 40 mg rosuvastatin, increase beta-blocker to metoprolol titrate 12.5 mg twice daily  PATIENT DISPOSITION:  ICU - extubated and stable.   Delay start of Pharmacological VTE agent (>24hrs) due to surgical blood loss or risk of bleeding: not applicable    Glenetta Hew, MD

## 2023-01-27 ENCOUNTER — Other Ambulatory Visit (HOSPITAL_COMMUNITY): Payer: Self-pay

## 2023-01-27 ENCOUNTER — Encounter (HOSPITAL_COMMUNITY): Payer: Self-pay | Admitting: Cardiology

## 2023-01-27 DIAGNOSIS — I25720 Atherosclerosis of autologous artery coronary artery bypass graft(s) with unstable angina pectoris: Secondary | ICD-10-CM

## 2023-01-27 DIAGNOSIS — I97630 Postprocedural hematoma of a circulatory system organ or structure following a cardiac catheterization: Secondary | ICD-10-CM | POA: Diagnosis not present

## 2023-01-27 DIAGNOSIS — E785 Hyperlipidemia, unspecified: Secondary | ICD-10-CM

## 2023-01-27 DIAGNOSIS — I5020 Unspecified systolic (congestive) heart failure: Secondary | ICD-10-CM | POA: Diagnosis present

## 2023-01-27 DIAGNOSIS — I2119 ST elevation (STEMI) myocardial infarction involving other coronary artery of inferior wall: Secondary | ICD-10-CM | POA: Diagnosis not present

## 2023-01-27 LAB — RENAL FUNCTION PANEL
Albumin: 3.3 g/dL — ABNORMAL LOW (ref 3.5–5.0)
Anion gap: 9 (ref 5–15)
BUN: 16 mg/dL (ref 8–23)
CO2: 23 mmol/L (ref 22–32)
Calcium: 8.6 mg/dL — ABNORMAL LOW (ref 8.9–10.3)
Chloride: 106 mmol/L (ref 98–111)
Creatinine, Ser: 0.98 mg/dL (ref 0.61–1.24)
GFR, Estimated: >60 mL/min (ref >60)
Glucose, Bld: 165 mg/dL — ABNORMAL HIGH (ref 70–99)
Phosphorus: 2.8 mg/dL (ref 2.5–4.6)
Potassium: 4.2 mmol/L (ref 3.5–5.1)
Sodium: 138 mmol/L (ref 135–145)

## 2023-01-27 LAB — CBC
HCT: 35 % — ABNORMAL LOW (ref 39.0–52.0)
Hemoglobin: 12.3 g/dL — ABNORMAL LOW (ref 13.0–17.0)
MCH: 33 pg (ref 26.0–34.0)
MCHC: 35.1 g/dL (ref 30.0–36.0)
MCV: 93.8 fL (ref 80.0–100.0)
Platelets: 119 10*3/uL — ABNORMAL LOW (ref 150–400)
RBC: 3.73 MIL/uL — ABNORMAL LOW (ref 4.22–5.81)
RDW: 13.5 % (ref 11.5–15.5)
WBC: 12.2 10*3/uL — ABNORMAL HIGH (ref 4.0–10.5)
nRBC: 0 % (ref 0.0–0.2)

## 2023-01-27 LAB — BPAM RBC
Blood Product Expiration Date: 202404232359
Blood Product Expiration Date: 202404242359
ISSUE DATE / TIME: 202403311327
ISSUE DATE / TIME: 202403311545
Unit Type and Rh: 5100
Unit Type and Rh: 5100

## 2023-01-27 LAB — TYPE AND SCREEN
ABO/RH(D): B POS
Antibody Screen: NEGATIVE
Unit division: 0
Unit division: 0

## 2023-01-27 MED ORDER — METOPROLOL TARTRATE 12.5 MG HALF TABLET
12.5000 mg | ORAL_TABLET | Freq: Two times a day (BID) | ORAL | Status: DC
  Administered 2023-01-27 (×2): 12.5 mg via ORAL
  Filled 2023-01-27 (×2): qty 1

## 2023-01-27 MED ORDER — COLESTIPOL HCL 1 G PO TABS
3.0000 g | ORAL_TABLET | Freq: Every day | ORAL | Status: DC
  Administered 2023-01-28 – 2023-01-29 (×2): 3 g via ORAL
  Filled 2023-01-27 (×4): qty 3

## 2023-01-27 MED ORDER — FAMOTIDINE IN NACL 20-0.9 MG/50ML-% IV SOLN
20.0000 mg | Freq: Once | INTRAVENOUS | Status: AC
  Administered 2023-01-27: 20 mg via INTRAVENOUS
  Filled 2023-01-27: qty 50

## 2023-01-27 MED ORDER — PROCHLORPERAZINE EDISYLATE 10 MG/2ML IJ SOLN
10.0000 mg | Freq: Once | INTRAMUSCULAR | Status: AC
  Administered 2023-01-27: 10 mg via INTRAVENOUS
  Filled 2023-01-27: qty 2

## 2023-01-27 NOTE — TOC Initial Note (Signed)
Transition of Care [facility]) - Initial/Assessment Note    Patient Details  Name: Steven Munoz MRN: [id number] Date of Birth: 10/31/1940  Transition [facility]) CM/SW Contact:    Erenest Rasher, RN Phone Number: [phone] 01/27/2023, 3:52 PM  Clinical Narrative:                  CM spoke to pt and son at bedside. Pt states he was independent PTA. Has RW and CPAP. Will continue to follow for dc needs.    Expected Discharge Plan: Home/Self Care Barriers to Discharge: Continued Medical Work up   Patient Goals and CMS Choice Patient states their goals for this hospitalization and ongoing recovery are:: wants to recover          Expected Discharge Plan and Services   Discharge Planning Services: CM Consult   Living arrangements for the past 2 months: Single Family Home                                      Prior Living Arrangements/Services Living arrangements for the past 2 months: Single Family Home Lives with:: Spouse Patient language and need for interpreter reviewed:: Yes Do you feel safe going back to the place where you live?: Yes      Need for Family Participation in Patient Care: No (Comment) Care giver support system in place?: Yes (comment) Current home services: DME (rolling walker, CPAP, crutches, cane)    Activities of Daily Living Home Assistive Devices/Equipment: Eyeglasses, CPAP, Hearing aid ADL Screening (condition at time of admission) Patient's cognitive ability adequate to safely complete daily activities?: Yes Is the patient deaf or have difficulty hearing?: Yes Does the patient have difficulty seeing, even when wearing glasses/contacts?: Yes Does the patient have difficulty concentrating, remembering, or making decisions?: No Patient able to express need for assistance with ADLs?: Yes Does the patient have difficulty dressing or bathing?: No Independently performs ADLs?: Yes (appropriate for developmental age) Does the patient  have difficulty walking or climbing stairs?: No Weakness of Legs: None Weakness of Arms/Hands: None  Permission Sought/Granted Permission sought to share information with : Case Manager, Family Supports, PCP    Share Information with NAME: Smiley Blosser     Permission granted to share info w Relationship: wife  Permission granted to share info w Contact Information: [phone]  Emotional Assessment Appearance:: Appears stated age Attitude/Demeanor/Rapport: Engaged Affect (typically observed): Accepting Orientation: : Oriented to Self, Oriented to Place, Oriented to  Time, Oriented to Situation   Psych Involvement: No (comment)  Admission diagnosis:  Acute ST elevation myocardial infarction (STEMI) of inferior wall [I21.19] Patient Active Problem List   Diagnosis Date Noted   Acute ST elevation myocardial infarction (STEMI) of inferior wall 01/26/2023   ST elevation myocardial infarction (STEMI) 01/26/2023   Generalized osteoarthritis of hand 11/25/2022   Exocrine pancreatic insufficiency 04/17/2022   Idiopathic chronic gout, unspecified site, without tophus (tophi) 10/31/2021   Peripheral neuropathy 03/20/2021   Bell's palsy 02/09/2020   Annual physical exam 06/06/2016   PCP NOTES >>>>>>>>>>>> 02/13/2016   CAD (coronary artery disease) 02/13/2016   Pedal edema 01/25/2016   Obesity (BMI 30-39.9) 06/02/2014   CTS (carpal tunnel syndrome) 06/01/2014   Varicose veins 04/07/2014   Prolonged P-R interval 03/04/2013   NEPHROLITHIASIS, HX OF 05/18/2010   ATRIAL FIBRILLATION 06/20/2009   CORONARY ARTERY BYPASS GRAFT, HX OF 06/20/2009   PREMATURE  VENTRICULAR CONTRACTIONS 05/09/2009   MURMUR 05/08/2009   Essential hypertension 04/10/2009   DIVERTICULOSIS, COLON 04/10/2009   G E R D 08/05/2008   OSA on CPAP 08/05/2008   DERMATOPHYTOSIS OF NAIL 07/29/2008   Nocturia 05/30/2008   Hypothyroidism 02/15/2008   Elevated lipids 02/15/2008   History of gout [id number]   DYSMETABOLIC  SYNDROME X [id number]   History of colonic polyps 01/14/2008   PCP:  Colon Branch, MD Pharmacy:   [facility] [place] [place] [facility] [address] [place] [address] Phone: [phone] Fax: [phone]  [id number] [place], [place] [facility] [address] [place] [address] Phone: [phone] Fax: [phone]  [facility] [address] [place] [address] Phone: [phone] Fax: [phone]     Social Determinants of Health (SDOH) Social History: SDOH Screenings   Food Insecurity: No Food Insecurity (01/26/2023)  Housing: Low Risk  (01/27/2023)  Transportation Needs: No Transportation Needs (01/26/2023)  Utilities: Not At Risk (01/26/2023)  Alcohol Screen: Low Risk  (01/27/2023)  Depression (PHQ2-9): Low Risk  (12/16/2022)  Financial Resource Strain: Low Risk  (01/27/2023)  Physical Activity: Sufficiently Active (11/14/2022)  Social Connections: Socially Integrated (08/07/2021)  Stress: No Stress Concern Present (08/07/2021)  Tobacco Use: Medium Risk (01/27/2023)   SDOH Interventions: Alcohol Usage Interventions: Intervention Not Indicated (Score <7) Financial Strain Interventions: Intervention Not Indicated   Readmission Risk Interventions     No data to display

## 2023-01-27 NOTE — Progress Notes (Signed)
  Progress Note    01/27/2023 7:53 AM 1 Day Post-Op  Subjective:  says he is having much better day already compared to yesterday. Some soreness in right groin. Sitting up eating breakfast   Vitals:   01/27/23 0500 01/27/23 0600  BP: 124/66 105/66  Pulse: 84 83  Resp: 19 15  Temp:    SpO2: 96% 97%   Physical Exam: Cardiac:  regular Lungs:  non labored Extremities:  Right groin femoral access site with hematoma present, significant ecchymosis extending to right flank and right groin,  pubic region. Soft. Brisk doppler DP/PT signals in right foot Abdomen:  soft, non distended Neurologic: alert and oriented  CBC    Component Value Date/Time   WBC 12.2 (H) 01/27/2023 0454   RBC 3.73 (L) 01/27/2023 0454   HGB 12.3 (L) 01/27/2023 0454   HCT 35.0 (L) 01/27/2023 0454   PLT 119 (L) 01/27/2023 0454   MCV 93.8 01/27/2023 0454   MCH 33.0 01/27/2023 0454   MCHC 35.1 01/27/2023 0454   RDW 13.5 01/27/2023 0454   LYMPHSABS 1,828 11/01/2022 1159   MONOABS 0.4 05/09/2022 0915   EOSABS 92 11/01/2022 1159   BASOSABS 40 11/01/2022 1159    BMET    Component Value Date/Time   NA 138 01/27/2023 0454   NA 139 09/06/2022 1057   K 4.2 01/27/2023 0454   CL 106 01/27/2023 0454   CO2 23 01/27/2023 0454   GLUCOSE 165 (H) 01/27/2023 0454   BUN 16 01/27/2023 0454   BUN 17 09/06/2022 1057   CREATININE 0.98 01/27/2023 0454   CREATININE 1.14 11/01/2022 1159   CALCIUM 8.6 (L) 01/27/2023 0454   GFRNONAA >60 01/27/2023 0454   GFRAA  06/01/2009 0445    >60        The eGFR has been calculated using the MDRD equation. This calculation has not been validated in all clinical situations. eGFR's persistently <60 mL/min signify possible Chronic Kidney Disease.    INR    Component Value Date/Time   INR 1.4 05/29/2009 1320     Intake/Output Summary (Last 24 hours) at 01/27/2023 0753 Last data filed at 01/27/2023 0600 Gross per 24 hour  Intake 377.19 ml  Output 300 ml  Net 77.19 ml      Assessment/Plan:  82 y.o. male is with right groin hematoma s/p heart catheterization. No signs of active bleeding or pseudoaneurysm. Hematoma on duplex. H&H stable. VSS. RLE well perfused and warm. No need for surgical intervention. Will continue to monitor   Steven Caldwell, PA-C [facility] [phone] 01/27/2023 7:53 AM

## 2023-01-27 NOTE — Progress Notes (Signed)
Heart Failure Nurse Navigator Progress Note  PCP: Steven Branch, MD PCP-Cardiologist: Steven Munoz Admission Diagnosis: STEMI Admitted from: Home  Presentation:   Steven Munoz presented with centralized chest pain and shortness of breath after falling asleep watching a basketball game. BP had been elevated thru out the day when he checked. BP 142/64, HR 75, BMI 37.47, Troponin >24,000. EKG showed ACUTE MI/ STEMI. IV heparin and Aggrastat given. Cath with thrombotic 90% occulusion. Thrombus found in PDA Munoz, Post cath was complicated with a right groin bleed, consult for vascular was done with no surgery recommended.   Patient reported that he goes to the gym 3-4 times per week and does both the treadmill walking and weights. Patient and his family wee educated on the sign and symptoms of heart failure, daily weights, when to call his doctor or go to the ED. Diet/ fluid restrictions, taking all medications as prescribed and attending all medical appointments. Patient verbalized his understanding and a appointment with Hf TOC was scheduled for 02/13/2023 @ 2 pm.     ECHO/ LVEF: 40-45% G1DD  Clinical Course:  Past Medical History:  Diagnosis Date   CAD (coronary artery disease) CABG  05/29/09    OPERATIVE PROCEDURES:  Median sternotomy, extracorporeal circulation,    Colonic polyp 2002, 2005, 2009   Dr Steven Munoz   Dermatophytosis of nail    Diverticulosis    Dysmetabolic syndrome    GERD (gastroesophageal reflux disease)    S/P dilation  2005   Gout    Hyperlipidemia    Hypertension    Hypothyroidism    Murmur    Nephrolithiasis    X 1   Peripheral neuropathy 03/20/2021   Pneumonia age 8   treated as inpatient   Sleep apnea    on CPAP; Dr Steven Munoz     Social History   Socioeconomic History   Marital status: Married    Spouse name: Steven Munoz   Number of children: 2   Years of education: Not on file   Highest education level: Not on file  Occupational History   Occupation: retired-  Chiropodist: [employer]  Tobacco Use   Smoking status: Former    Packs/day: 1.00    Years: 6.00    Additional pack years: 0.00    Total pack years: 6.00    Types: Cigarettes    Quit date: 10/28/1966    Years since quitting: 56.2   Smokeless tobacco: Never   Tobacco comments:    smoked 1962- 1968, up to < 1 ppd  Vaping Use   Vaping Use: Never used  Substance and Sexual Activity   Alcohol use: Yes    Alcohol/week: 3.0 standard drinks of alcohol    Types: 3 Glasses of wine per week    Comment: socially   Drug use: No   Sexual activity: Not on file  Other Topics Concern   Not on file  Social History Narrative   Socially; lives with spouse   Right Handed   Drinks no caffeine   Social Determinants of Health   Financial Resource Strain: Low Risk  (11/14/2022)   Overall Financial Resource Strain (CARDIA)    Difficulty of Paying Living Expenses: Not hard at all  Food Insecurity: No Food Insecurity (01/26/2023)   Hunger Vital Sign    Worried About Running Out of Food in the Last Year: Never true    Ran Out of Food in the Last Year: Never true  Transportation  Needs: No Transportation Needs (01/26/2023)   PRAPARE - Hydrologist (Medical): No    Lack of Transportation (Non-Medical): No  Physical Activity: Sufficiently Active (11/14/2022)   Exercise Vital Sign    Days of Exercise per Week: 7 days    Minutes of Exercise per Session: 60 min  Stress: No Stress Concern Present (08/07/2021)   [place]    Feeling of Stress : Not at all  Social Connections: [place] (08/07/2021)   Social Connection and Isolation Panel [NHANES]    Frequency of Communication with Friends and Family: More than three times a week    Frequency of Social Gatherings with Friends and Family: More than three times a week    Attends Religious Services: More than 4 times per year     Active Member of [employer] or Organizations: Yes    Attends Music therapist: More than 4 times per year    Marital Status: Married   Teacher, early years/pre and Provision:  Detailed education and instructions provided on heart failure disease management including the following:  Signs and symptoms of Heart Failure When to call the physician Importance of daily weights Low sodium diet Fluid restriction Medication management Anticipated future follow-up appointments  Patient education given on each of the above topics.  Patient acknowledges understanding via teach back method and acceptance of all instructions.  Education Materials:  "Living Better With Heart Failure" Booklet, HF zone tool, & Daily Weight Tracker Tool.  Patient has scale at home: Yes Patient has pill box at home: Yes    High Risk Criteria for Readmission and/or Poor Patient Outcomes: Heart failure hospital admissions (last 6 months): 0  No Show rate: 3 % Difficult social situation: No Demonstrates medication adherence: Yes Primary Language: English Literacy level: Reading, writing, and comprehension  Barriers of Care:   Diet/ fluids/ Daily weights  Considerations/Referrals:   Referral made to Heart Failure Pharmacist Stewardship: Yes Referral made to Heart Failure CSW/NCM TOC: No Referral made to Heart & Vascular TOC clinic: Yes, 02/13/2023 @ 2pm  Items for Follow-up on DC/TOC: Diet/ fluids/ daily weights Continued HF Education   Steven Munoz, BSN, RN Heart Failure Transport planner Only

## 2023-01-27 NOTE — Progress Notes (Addendum)
   Heart Failure Stewardship Pharmacist Progress Note   PCP: Colon Branch, MD PCP-Cardiologist: Jenkins Rouge, MD    HPI:  82 yo M with PMH of CAD s/p CABG in 2012, HTN, HLD, hypothyroidism, polyneuropathy, and sleep apnea.   Presented to the ED on 3/30 with chest pain and shortness of breath. EKG performed in triage and ST elevations in inferior lateral leads. Code STEMI activated. Culprit lesion is thrombotic 90% occlusion in the anastamosis of the SVG-PDA. Also found thrombus in PDA branch, difficult access. Planned to treat with aggrastat and reevaluate next week. ECHO 3/31 showed LVEF reduced to 40-45% (60-65% in 05/2022), global hypokinesis, G1DD, and RV normal. Post cath complicated by R groin bleed. Vascular consulted. Duplex ordered and no active bleeding or pseudoaneurysm, no surgery recommended.   Current HF Medications: Beta Blocker: metoprolol tartrate 12.5 mg BID  Prior to admission HF Medications: Beta blocker: metoprolol tartrate 12.5 mg daily ACE/ARB/ARNI: losartan 100 mg daily  Pertinent Lab Values: Serum creatinine 0.98, BUN 16, Potassium 4.2, Sodium 138  Vital Signs: Weight: 277 lbs Blood pressure: 110-120/60s  Heart rate: 80s   Medication Assistance / Insurance Benefits Check: Does the patient have prescription insurance?  Yes Type of insurance plan: [place] [employer]  Does the patient qualify for medication assistance through manufacturers or grants?   Pending Eligible grants and/or patient assistance programs: pending Medication assistance applications in progress: none  Medication assistance applications approved: none Approved medication assistance renewals will be completed by: pending  Outpatient Pharmacy[facility]  [facility] the patient willing to use [facility] at discharge? Yes Is the patient willing to transition their outpatient pharmacy to utilize a [facility] outpatient pharmacy?   No    Assessment: 1. 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 NYHA class II symptoms. - Not volume overloaded on exam, watch off diuretics. Daily weights. Strict I/Os.  - Continue metoprolol tartrate 12.5 mg BID - PTA losartan on hold with hypotension - Consider adding low dose MRA prior to discharge - Consider starting SGLT2i prior to discharge if BP continues to limit other GDMT   Plan: 1) Medication changes recommended at this time: - Add Farxiga 10 mg daily  2) Patient assistance: - Farxiga/Jardiance copay $47 - Entresto copay $47  3)  Education  - Patient has been educated on current HF medications and potential additions to HF medication regimen - Patient verbalizes understanding that over the next few months, these medication doses may change and more medications may be added to optimize HF regimen - Patient has been educated on basic disease state pathophysiology and goals of therapy\  Kerby Nora, PharmD, BCPS Heart Failure Stewardship Pharmacist Phone [phone]

## 2023-01-27 NOTE — Progress Notes (Addendum)
Rounding Note    Patient Name: Steven Munoz Date of Encounter: 01/27/2023  [place] Cardiologist: Jenkins Rouge, MD   Subjective   Doing alright this morning. Discussed possibility of return to cath lab with patient. Not noticed return of pain. He has not tried to ambulate.     Inpatient Medications    Scheduled Meds:  allopurinol  100 mg Oral Daily   aspirin EC  81 mg Oral Daily   Chlorhexidine Gluconate Cloth  6 each Topical Daily   clopidogrel  75 mg Oral Q breakfast   colestipol  3 g Oral Daily   famotidine  40 mg Oral QHS   levothyroxine  125 mcg Oral Q0600   magnesium oxide  400 mg Oral Daily   metoprolol tartrate  12.5 mg Oral BID   potassium chloride  10 mEq Oral Daily   rosuvastatin  40 mg Oral Daily   sodium chloride flush  3 mL Intravenous Q12H   Continuous Infusions:  sodium chloride     promethazine (PHENERGAN) injection (IM or IVPB) Stopped (01/26/23 2332)   PRN Meds: sodium chloride, acetaminophen, hydrocortisone cream, ondansetron (ZOFRAN) IV, promethazine (PHENERGAN) injection (IM or IVPB), sodium chloride flush   Vital Signs    Vitals:   01/27/23 0900 01/27/23 1000 01/27/23 1100 01/27/23 1215  BP: 116/62 123/64 (!) 116/55 111/65  Pulse: 82 77 (!) 54 79  Resp: 16 (!) 27 (!) 24 (!) 21  Temp:   (P) 99.2 F (37.3 C)   TempSrc:   (P) Oral   SpO2: 97% 99% 98% 96%  Weight:      Height:        Intake/Output Summary (Last 24 hours) at 01/27/2023 1338 Last data filed at 01/27/2023 0800 Gross per 24 hour  Intake 497.19 ml  Output 300 ml  Net 197.19 ml      01/27/2023    4:00 AM 01/26/2023    3:05 AM 01/25/2023   11:31 PM  Last 3 Weights  Weight (lbs) 277 lb 5.4 oz 229 lb 0.9 oz 284 lb  Weight (kg) 125.8 kg 103.9 kg 128.822 kg      Telemetry    NSR - Personally Reviewed  ECG    Sinus rhythm. Resolution of inferior ST elevations compared to prior - Personally Reviewed  Physical Exam   GEN: No acute distress.   Neck: No  JVD Cardiac: normal S1 and S2, RRR, 2/6 holosystolic murmur Right groin hematoma.  Respiratory: Clear to auscultation bilaterally. Breathing comfortably on 2L [place] GI: Soft, nontender, non-distended  MS: No edema; No deformity. Neuro:  Nonfocal  Psych: Normal affect   Labs    High Sensitivity Troponin:   Recent Labs  Lab 01/25/23 2320 01/26/23 0328  TROPONINIHS 561* >24,000*     Chemistry Recent Labs  Lab 01/25/23 2320 01/26/23 0328 01/27/23 0454  NA 136  --  138  K 3.5  --  4.2  CL 103  --  106  CO2 24  --  23  GLUCOSE 142*  --  165*  BUN 21  --  16  CREATININE 0.96 0.83 0.98  CALCIUM 9.3  --  8.6*  ALBUMIN  --   --  3.3*  GFRNONAA >60 >60 >60  ANIONGAP 9  --  9    Lipids No results for input(s): "CHOL", "TRIG", "HDL", "LABVLDL", "LDLCALC", "CHOLHDL" in the last 168 hours.  Hematology Recent Labs  Lab 01/26/23 1339 01/26/23 1919 01/27/23 0454  WBC 8.9 12.8* 12.2*  RBC 3.99* 4.24 3.73*  HGB 12.9* 13.7 12.3*  HCT 38.4* 39.1 35.0*  MCV 96.2 92.2 93.8  MCH 32.3 32.3 33.0  MCHC 33.6 35.0 35.1  RDW 12.7 13.2 13.5  PLT 150 161 119*   Thyroid No results for input(s): "TSH", "FREET4" in the last 168 hours.  BNPNo results for input(s): "BNP", "PROBNP" in the last 168 hours.  DDimer No results for input(s): "DDIMER" in the last 168 hours.   Radiology    VAS [place] GROIN PSEUDOANEURYSM  Result Date: 01/27/2023  ARTERIAL PSEUDOANEURYSM  Patient Name:  RAJU ROUTH  Date of Exam:   01/26/2023 Medical Rec #: [id number]         Accession #:    [id number] Date of Birth: 12-Sep-1941          Patient Gender: M Patient Age:   82 years Exam Location:  [facility] Procedure:      VAS [place] GROIN PSEUDOANEURYSM Referring Phys: Harold Barban --------------------------------------------------------------------------------  Exam: Right groin History: S/p catheterization. Limitations: Abdominal girth, pain post compression Comparison Study: No prior study Performing Technologist:  Sharion Dove RVS  Examination Guidelines: A complete evaluation includes B-mode imaging, spectral Doppler, color Doppler, and power Doppler as needed of all accessible portions of each vessel. Bilateral testing is considered an integral part of a complete examination. Limited examinations for reoccurring indications may be performed as noted.  Findings: A mixed echogenic structure measuring approximately 6.4 cm x 7.4 cm is visualized at the Right groin with ultrasound characteristics of a hematoma.  Summary: No evidence of pseudoaneurysm, AVF or DVT  Diagnosing physician: Monica Martinez MD Electronically signed by Monica Martinez MD on 01/27/2023 at 10:53:57 AM.   --------------------------------------------------------------------------------    Final    ECHOCARDIOGRAM COMPLETE  Result Date: 01/26/2023    ECHOCARDIOGRAM REPORT   Patient Name:   TEMUR SCHU Date of Exam: 01/26/2023 Medical Rec #:  [id number]        Height:       73.0 in Accession #:    [id number]       Weight:       229.1 lb Date of Birth:  09-09-1941         BSA:          2.279 m Patient Age:    82 years         BP:           123/71 mmHg Patient Gender: M                HR:           53 bpm. Exam Location:  Inpatient Procedure: 2D Echo, Color Doppler and Cardiac Doppler Indications:    myocardial infarct.  History:        Patient has prior history of Echocardiogram examinations, most                 recent 06/18/2022. Prior CABG, Arrythmias:Atrial Fibrillation and                 PVC, Signs/Symptoms:Murmur; Risk Factors:Sleep Apnea and                 Hypertension.  Sonographer:    Johny Chess RDCS Referring Phys: 105 Demarus Latterell W Katlynn Naser  Sonographer Comments: Patient is obese and suboptimal subcostal window. Image acquisition challenging due to patient body habitus. IMPRESSIONS  1. Left ventricular ejection fraction, by estimation, is 40 to 45%. The left ventricle has mildly decreased function. The  left ventricle demonstrates global  hypokinesis. Left ventricular diastolic parameters are consistent with Grade I diastolic dysfunction (impaired relaxation).  2. Right ventricular systolic function is normal. The right ventricular size is normal.  3. Left atrial size was moderately dilated.  4. The mitral valve is normal in structure. Trivial mitral valve regurgitation. No evidence of mitral stenosis.  5. The aortic valve was not well visualized. Aortic valve regurgitation is not visualized. No aortic stenosis is present. Comparison(s): Changes from prior study are noted. LVEF worsened from 60-65% in 05/2022 to 40-45% now. Mild AS and mild MS noted on 05/2022 are not evident anymore. FINDINGS  Left Ventricle: Left ventricular ejection fraction, by estimation, is 40 to 45%. The left ventricle has mildly decreased function. The left ventricle demonstrates global hypokinesis. The left ventricular internal cavity size was normal in size. There is  no left ventricular hypertrophy. Left ventricular diastolic parameters are consistent with Grade I diastolic dysfunction (impaired relaxation). Right Ventricle: The right ventricular size is normal. No increase in right ventricular wall thickness. Right ventricular systolic function is normal. Left Atrium: Left atrial size was moderately dilated. Right Atrium: Right atrial size was normal in size. Pericardium: There is no evidence of pericardial effusion. Mitral Valve: The mitral valve is normal in structure. Trivial mitral valve regurgitation. No evidence of mitral valve stenosis. Tricuspid Valve: The tricuspid valve is normal in structure. Tricuspid valve regurgitation is trivial. No evidence of tricuspid stenosis. Aortic Valve: The aortic valve was not well visualized. Aortic valve regurgitation is not visualized. No aortic stenosis is present. Pulmonic Valve: The pulmonic valve was normal in structure. Pulmonic valve regurgitation is trivial. No evidence of pulmonic stenosis. Aorta: The aortic root and  ascending aorta are structurally normal, with no evidence of dilitation. Venous: The inferior vena cava was not well visualized. IAS/Shunts: The interatrial septum was not well visualized.  LEFT VENTRICLE PLAX 2D LVIDd:         5.60 cm      Diastology LVIDs:         4.50 cm      LV e' medial:    6.85 cm/s LV PW:         1.00 cm      LV E/e' medial:  9.0 LV IVS:        1.10 cm      LV e' lateral:   7.62 cm/s LVOT diam:     2.10 cm      LV E/e' lateral: 8.1 LV SV:         80 LV SV Index:   35 LVOT Area:     3.46 cm  LV Volumes (MOD) LV vol d, MOD A2C: 111.0 ml LV vol s, MOD A2C: 60.0 ml LV SV MOD A2C:     51.0 ml RIGHT VENTRICLE RV Basal diam:  3.30 cm RV S prime:     9.57 cm/s TAPSE (M-mode): 1.9 cm LEFT ATRIUM              Index        RIGHT ATRIUM           Index LA diam:        5.00 cm  2.19 cm/m   RA Area:     17.20 cm LA Vol (A2C):   82.8 ml  36.33 ml/m  RA Volume:   52.60 ml  23.08 ml/m LA Vol (A4C):   100.0 ml 43.87 ml/m LA Biplane Vol: 99.3 ml  43.57 ml/m  AORTIC VALVE LVOT Vmax:   89.70 cm/s LVOT Vmean:  57.100 cm/s LVOT VTI:    0.230 m  AORTA Ao Root diam: 3.50 cm Ao Asc diam:  3.30 cm MITRAL VALVE               TRICUSPID VALVE MV Area (PHT): 2.52 cm    TR Peak grad:   24.2 mmHg MV Decel Time: 301 msec    TR Vmax:        246.00 cm/s MV E velocity: 61.60 cm/s MV A velocity: 75.10 cm/s  SHUNTS MV E/A ratio:  0.82        Systemic VTI:  0.23 m                            Systemic Diam: 2.10 cm Vishnu Priya Mallipeddi Electronically signed by Lorelee Cover Mallipeddi Signature Date/Time: 01/26/2023/11:58:53 AM    Final    CARDIAC CATHETERIZATION  Result Date: 01/26/2023   Mid LM to Ost LAD lesion is 20% stenosed with 20% stenosed side branch in Ost Cx.   Ost LAD to Prox LAD lesion is 75% stenosed.  Prox LAD to Mid LAD lesion is 95% stenosed with 99% stenosed side branch in 1st Diag. Mid LAD lesion is 100% stenosed.  2nd Diag lesion is 80% stenosed.   Prox RCA lesion is 60% stenosed.  Mid RCA to Dist RCA  lesion is 100% stenosed.   LIMA-LAD graft was visualized by angiography and is normal in caliber and moderate in size.  The graft exhibits no disease.   SVG-dRCA (PDA-PAV bifurcation) graft was visualized by angiography and is large.  The graft exhibits minimal luminal irregularities.   CULPRIT LESION: RPDA lesion is 60% stenosed. ->  Thrombotic stenosis, unable to really determine initial lesion and vessel size.   There is mild left ventricular systolic dysfunction.  The left ventricular ejection fraction is 45-50% by visual estimate.  LV end diastolic pressure is normal.   There is no aortic valve stenosis. =================================================================== POST-OPERATIVE DIAGNOSIS:  Culprit lesion is thrombotic 90% occlusion in the anastomosis of the SVG-PDA (the graft actually anastomosis at the bifurcation of the PL and PDA and the distal RCA.  There is thrombus in the PDA branch but not in the PL branch with TIMI-3 flow => very difficult high graft to access.  Plan was to treat with Aggrastat and reevaluate early next week. Native RCA has extensive 60% proximal to mid stenosis and is occluded in the distal vessel. Native the left main has distal 20% stenosis branches into the LAD and LCx.  LCx courses as a large caliber bifurcating OM branch with a AV groove branch that is free of disease.  The ostial LAD has 80% stenosis then has severe 90+ percent stenosis and a small diagonal just after SP1 it is then 100% occluded shortly after this branch. LIMA to LAD is widely patent with antegrade flow down to the apex and retrograde flow back up to a small diagonal branch with trivial flow. Normal LVEDP of 12 mmHg.  Mild inferior hypokinesis but very poor visualization.  Recommend 2D echocardiogram.  Otherwise EF appears to be maybe 45 to 50%. PLAN OF CARE: Admit to inpatient  -> run Aggrastat to discontinue at 0830 hrs., pull sheath 2 hours later.=> Reassess EKG and symptoms, would probably consider  relook catheterization on Monday to reassess the anastomotic lesion. Check 2D echo Increase statin to 40 mg rosuvastatin, increase beta-blocker to  metoprolol titrate 12.5 mg twice daily   DG Chest Port 1 View  Result Date: 01/25/2023 CLINICAL DATA:  Chest pain. EXAM: PORTABLE CHEST 1 VIEW COMPARISON:  Chest radiograph dated 09/03/2022. FINDINGS: Mild eventration of the right hemidiaphragm. Minimal left lung base atelectasis. No focal consolidation, pleural effusion, or pneumothorax. The cardiac silhouette is within normal limits. Median sternotomy wires. No acute osseous pathology. IMPRESSION: No active disease. Electronically Signed   By: Anner Crete M.D.   On: 01/25/2023 23:53    Cardiac Studies   01/26/23 ECHO:  1. Left ventricular ejection fraction, by estimation, is 40 to 45%. The left ventricle has mildly decreased function. The left ventricle  demonstrates global hypokinesis. Left ventricular diastolic parameters are consistent with Grade I diastolic dysfunction (impaired relaxation).   2. Right ventricular systolic function is normal. The right ventricular size is normal.   3. Left atrial size was moderately dilated.   4. The mitral valve is normal in structure. Trivial mitral valve regurgitation. No evidence of mitral stenosis.   5. The aortic valve was not well visualized. Aortic valve regurgitation is not visualized. No aortic stenosis is present.   01/26/23 Cath:   Mid LM to Ost LAD lesion is 20% stenosed with 20% stenosed side branch in Ost Cx.   Ost LAD to Prox LAD lesion is 75% stenosed.  Prox LAD to Mid LAD lesion is 95% stenosed with 99% stenosed side branch in 1st Diag. Mid LAD lesion is 100% stenosed.  2nd Diag lesion is 80% stenosed.   Prox RCA lesion is 60% stenosed.  Mid RCA to Dist RCA lesion is 100% stenosed.   LIMA-LAD graft was visualized by angiography and is normal in caliber and moderate in size.  The graft exhibits no disease.   SVG-dRCA (PDA-PAV bifurcation)  graft was visualized by angiography and is large.  The graft exhibits minimal luminal irregularities.   CULPRIT LESION: RPDA lesion is 60% stenosed. ->  Thrombotic stenosis, unable to really determine initial lesion and vessel size.   There is mild left ventricular systolic dysfunction.  The left ventricular ejection fraction is 45-50% by visual estimate.  LV end diastolic pressure is normal.   There is no aortic valve stenosis. POST-OPERATIVE DIAGNOSIS:   Culprit lesion is thrombotic 90% occlusion in the anastomosis of the SVG-PDA (the graft actually anastomosis at the bifurcation of the PL and PDA and the distal RCA.  There is thrombus in the PDA branch but not in the PL branch with TIMI-3 flow => very difficult high graft to access.  Plan was to treat with Aggrastat and reevaluate early next week. Native RCA has extensive 60% proximal to mid stenosis and is occluded in the distal vessel. Native the left main has distal 20% stenosis branches into the LAD and LCx.  LCx courses as a large caliber bifurcating OM branch with a AV groove branch that is free of disease.  The ostial LAD has 80% stenosis then has severe 90+ percent stenosis and a small diagonal just after SP1 it is then 100% occluded shortly after this branch. LIMA to LAD is widely patent with antegrade flow down to the apex and retrograde flow back up to a small diagonal branch with trivial flow. Normal LVEDP of 12 mmHg.  Mild inferior hypokinesis but very poor visualization.  Recommend 2D echocardiogram.  Otherwise EF appears to be maybe 45 to 50%.     Patient Profile     82 y.o. male with a hx of coronary artery disease  status post coronary artery bypass surgery in 2012 (LIMA to LAD; SVG sequential to PDA and PLB), hypertension, hypercholesteremia, hypothyroidism, polyneuropathy, and sleep apnea who is being seen 01/26/2023 for the evaluation of chest pain   Assessment & Plan    # STEMI LHC on 01/26/2023 showed thrombus in the SVG to  PDA. His EKG demonstrated resolution of ST elevations with Aggrastat treatment.   No return to cath lab necessary. Continuing medical management today.   Plan to ambulate today and monitor symptoms.  - continue ASA and Plavix daily - Continue metop tartrate BID today. Will switch to succinate tomorrow.  - Restart arb tomorrow.  - HLD: Crestor 40mg  - ambulate - transfer out of ICU today I personally reviewed Cath Films with interventional colleagues, all 3 colleagues agreed that the best course of action is to continue the plan of medical management and not consider relook catheterization unless he has symptoms.  Will continue aspirin and Plavix for 1 year for ACS.  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  Currently on BB. BP adequate and renal function preserved. Restarting ARB tomorrow -> was on 100 mg losartan at home, will probably start lower at maybe 25 to 50 mg depending on blood pressures. -> Currently on Lopressor 12.5 mg twice daily will convert to Toprol 25 mg daily tomorrow -> Euvolemic on exam w/ exception of trace edema. - ? Low dose PRN diuretic on d/c  # R Groin Hematoma s/p Cath H+H stable after levophed and 1 unit PRBCs yesterday. No active extravasation or pseudo-aneurysm was noted on [place]  yesterday and no operative intervention necessary per [facility]. Appears stable today.  - continue to monitor.   # CAD s/p CABG - ASA and Plavix  For questions or updates, please contact [facility] Please consult [email or web address] for contact info under   Signed, Delene Ruffini, MD  01/27/2023, 1:38 PM    ATTENDING ATTESTATION  I have seen, examined and evaluated the patient this A   M on rounds along with Resident Physician Delene Ruffini, MD .  After reviewing all the available data and chart, we discussed the patients laboratory, study & physical findings as well as symptoms in detail.  I agree with his findings, examination as well  as impression recommendations as per our discussion.    Attending adjustments noted in italics.   Doing well today - Hgb stable as is groin hematoma - large ecchymoses, but stable.   No angina -- need to ambulate to ensure no further Sx -- txfr to floor.  Echo EF down slighty - convert lopressor to Toprol on d/c & restart ARB in AM once BP stable. .  ? D/c in 1-2 days depending on BP & Sx.     Leonie Man, MD, MS Glenetta Hew, M.D., M.S. Interventional Cardiologist  [facility]  Pager # [phone] Phone # [phone] [address] [place], [place] [address]

## 2023-01-28 DIAGNOSIS — I10 Essential (primary) hypertension: Secondary | ICD-10-CM

## 2023-01-28 DIAGNOSIS — I5020 Unspecified systolic (congestive) heart failure: Secondary | ICD-10-CM | POA: Diagnosis not present

## 2023-01-28 DIAGNOSIS — I97630 Postprocedural hematoma of a circulatory system organ or structure following a cardiac catheterization: Secondary | ICD-10-CM | POA: Diagnosis not present

## 2023-01-28 DIAGNOSIS — I25110 Atherosclerotic heart disease of native coronary artery with unstable angina pectoris: Secondary | ICD-10-CM | POA: Diagnosis not present

## 2023-01-28 DIAGNOSIS — I2119 ST elevation (STEMI) myocardial infarction involving other coronary artery of inferior wall: Secondary | ICD-10-CM | POA: Diagnosis not present

## 2023-01-28 DIAGNOSIS — I255 Ischemic cardiomyopathy: Secondary | ICD-10-CM

## 2023-01-28 LAB — CBC
HCT: 30.7 % — ABNORMAL LOW (ref 39.0–52.0)
Hemoglobin: 10.3 g/dL — ABNORMAL LOW (ref 13.0–17.0)
MCH: 32.2 pg (ref 26.0–34.0)
MCHC: 33.6 g/dL (ref 30.0–36.0)
MCV: 95.9 fL (ref 80.0–100.0)
Platelets: 115 10*3/uL — ABNORMAL LOW (ref 150–400)
RBC: 3.2 MIL/uL — ABNORMAL LOW (ref 4.22–5.81)
RDW: 13.4 % (ref 11.5–15.5)
WBC: 11.4 10*3/uL — ABNORMAL HIGH (ref 4.0–10.5)
nRBC: 0 % (ref 0.0–0.2)

## 2023-01-28 LAB — LIPID PANEL
Cholesterol: 86 mg/dL (ref 0–200)
HDL: 37 mg/dL — ABNORMAL LOW (ref >40)
LDL Cholesterol: 35 mg/dL (ref 0–99)
Total CHOL/HDL Ratio: 2.3 RATIO
Triglycerides: 72 mg/dL (ref <150)
VLDL: 14 mg/dL (ref 0–40)

## 2023-01-28 LAB — BASIC METABOLIC PANEL
Anion gap: 9 (ref 5–15)
BUN: 24 mg/dL — ABNORMAL HIGH (ref 8–23)
CO2: 22 mmol/L (ref 22–32)
Calcium: 8.5 mg/dL — ABNORMAL LOW (ref 8.9–10.3)
Chloride: 104 mmol/L (ref 98–111)
Creatinine, Ser: 0.96 mg/dL (ref 0.61–1.24)
GFR, Estimated: >60 mL/min (ref >60)
Glucose, Bld: 127 mg/dL — ABNORMAL HIGH (ref 70–99)
Potassium: 4.3 mmol/L (ref 3.5–5.1)
Sodium: 135 mmol/L (ref 135–145)

## 2023-01-28 LAB — LIPOPROTEIN A (LPA): Lipoprotein (a): 237.7 nmol/L — ABNORMAL HIGH (ref <75.0)

## 2023-01-28 MED ORDER — METOPROLOL SUCCINATE ER 25 MG PO TB24
25.0000 mg | ORAL_TABLET | Freq: Every day | ORAL | Status: DC
  Administered 2023-01-28 – 2023-01-30 (×3): 25 mg via ORAL
  Filled 2023-01-28 (×3): qty 1

## 2023-01-28 MED ORDER — LOSARTAN POTASSIUM 25 MG PO TABS
25.0000 mg | ORAL_TABLET | Freq: Every day | ORAL | Status: DC
  Administered 2023-01-28 – 2023-01-30 (×3): 25 mg via ORAL
  Filled 2023-01-28 (×3): qty 1

## 2023-01-28 NOTE — Progress Notes (Cosign Needed)
Rounding Note    Patient Name: Steven Munoz Date of Encounter: 01/28/2023  [place] Cardiologist: Jenkins Rouge, MD   Subjective   Swelling and bruising in scrotum after bleed. Not enlarging but is tender. Otherwise doing well. Not having any chest pain. He has been able to ambulate without CP or SHOB.   Inpatient Medications    Scheduled Meds:  allopurinol  100 mg Oral Daily   aspirin EC  81 mg Oral Daily   clopidogrel  75 mg Oral Q breakfast   colestipol  3 g Oral Daily   famotidine  40 mg Oral QHS   levothyroxine  125 mcg Oral Q0600   losartan  25 mg Oral Daily   magnesium oxide  400 mg Oral Daily   metoprolol succinate  25 mg Oral Daily   potassium chloride  10 mEq Oral Daily   rosuvastatin  40 mg Oral Daily   sodium chloride flush  3 mL Intravenous Q12H   Continuous Infusions:  sodium chloride     promethazine (PHENERGAN) injection (IM or IVPB) Stopped (01/26/23 2332)   PRN Meds: sodium chloride, acetaminophen, hydrocortisone cream, ondansetron (ZOFRAN) IV, promethazine (PHENERGAN) injection (IM or IVPB), sodium chloride flush   Vital Signs    Vitals:   01/28/23 0049 01/28/23 0352 01/28/23 0840 01/28/23 1107  BP: 110/63 (!) 123/55 (!) 113/53 114/67  Pulse: 82 61 84 85  Resp: 18 18 17 18   Temp: 99.3 F (37.4 C) 97.8 F (36.6 C) 97.8 F (36.6 C) 97.8 F (36.6 C)  TempSrc: Oral Oral Oral Oral  SpO2: 96% 94% 99%   Weight:  131.1 kg    Height:        Intake/Output Summary (Last 24 hours) at 01/28/2023 1505 Last data filed at 01/28/2023 1300 Gross per 24 hour  Intake 480 ml  Output --  Net 480 ml      01/28/2023    3:52 AM 01/27/2023    4:00 AM 01/26/2023    3:05 AM  Last 3 Weights  Weight (lbs) 289 lb 277 lb 5.4 oz 229 lb 0.9 oz  Weight (kg) 131.09 kg 125.8 kg 103.9 kg      Telemetry    NSR with frequent PVCs - Personally Reviewed  ECG    Sinus rhythm. Resolution of inferior ST elevations compared to prior - Personally  Reviewed  Physical Exam   GEN: No acute distress.   Neck: No JVD Cardiac: normal S1 and S2, RRR, 2/6 holosystolic murmur   Respiratory: Clear to auscultation bilaterally. Breathing comfortably on 2L [place] GI: Soft, nontender, non-distended  GU: significant scrotal swelling and bruising, tender to palpation Skin: ecchymosis present along RLQ extending to suprapubic region, scrotum and R upper thigh medially. MS: No edema; No deformity. Neuro:  Nonfocal  Psych: Normal affect   Labs    High Sensitivity Troponin:   Recent Labs  Lab 01/25/23 2320 01/26/23 0328  TROPONINIHS 561* >24,000*     Chemistry Recent Labs  Lab 01/25/23 2320 01/26/23 0328 01/27/23 0454 01/28/23 0223  NA 136  --  138 135  K 3.5  --  4.2 4.3  CL 103  --  106 104  CO2 24  --  23 22  GLUCOSE 142*  --  165* 127*  BUN 21  --  16 24*  CREATININE 0.96 0.83 0.98 0.96  CALCIUM 9.3  --  8.6* 8.5*  ALBUMIN  --   --  3.3*  --   GFRNONAA >60 >  60 >60 >60  ANIONGAP 9  --  9 9    Lipids  Recent Labs  Lab 01/28/23 0223  CHOL 86  TRIG 72  HDL 37*  LDLCALC 35  CHOLHDL 2.3    Hematology Recent Labs  Lab 01/26/23 1919 01/27/23 0454 01/28/23 0223  WBC 12.8* 12.2* 11.4*  RBC 4.24 3.73* 3.20*  HGB 13.7 12.3* 10.3*  HCT 39.1 35.0* 30.7*  MCV 92.2 93.8 95.9  MCH 32.3 33.0 32.2  MCHC 35.0 35.1 33.6  RDW 13.2 13.5 13.4  PLT 161 119* 115*   Thyroid No results for input(s): "TSH", "FREET4" in the last 168 hours.  BNPNo results for input(s): "BNP", "PROBNP" in the last 168 hours.  DDimer No results for input(s): "DDIMER" in the last 168 hours.   Radiology    VAS [place] GROIN PSEUDOANEURYSM  Result Date: 01/27/2023  ARTERIAL PSEUDOANEURYSM  Patient Name:  KAMARION CICALE  Date of Exam:   01/26/2023 Medical Rec #: [id number]         Accession #:    [id number] Date of Birth: [birth year]          Patient Gender: M Patient Age:   8 years Exam Location:  [facility] Procedure:      VAS [place] GROIN PSEUDOANEURYSM  Referring Phys: Harold Barban --------------------------------------------------------------------------------  Exam: Right groin History: S/p catheterization. Limitations: Abdominal girth, pain post compression Comparison Study: No prior study Performing Technologist: Sharion Dove RVS  Examination Guidelines: A complete evaluation includes B-mode imaging, spectral Doppler, color Doppler, and power Doppler as needed of all accessible portions of each vessel. Bilateral testing is considered an integral part of a complete examination. Limited examinations for reoccurring indications may be performed as noted.  Findings: A mixed echogenic structure measuring approximately 6.4 cm x 7.4 cm is visualized at the Right groin with ultrasound characteristics of a hematoma.  Summary: No evidence of pseudoaneurysm, AVF or DVT  Diagnosing physician: Monica Martinez MD Electronically signed by Monica Martinez MD on 01/27/2023 at 10:53:57 AM.   --------------------------------------------------------------------------------    Final     Cardiac Studies   01/26/23 ECHO:  1. Left ventricular ejection fraction, by estimation, is 40 to 45%. The left ventricle has mildly decreased function. The left ventricle  demonstrates global hypokinesis. Left ventricular diastolic parameters are consistent with Grade I diastolic dysfunction (impaired relaxation).   2. Right ventricular systolic function is normal. The right ventricular size is normal.   3. Left atrial size was moderately dilated.   4. The mitral valve is normal in structure. Trivial mitral valve regurgitation. No evidence of mitral stenosis.   5. The aortic valve was not well visualized. Aortic valve regurgitation is not visualized. No aortic stenosis is present.   01/26/23 Cath:   Mid LM to Ost LAD lesion is 20% stenosed with 20% stenosed side branch in Ost Cx.   Ost LAD to Prox LAD lesion is 75% stenosed.  Prox LAD to Mid LAD lesion is 95% stenosed with 99%  stenosed side branch in 1st Diag. Mid LAD lesion is 100% stenosed.  2nd Diag lesion is 80% stenosed.   Prox RCA lesion is 60% stenosed.  Mid RCA to Dist RCA lesion is 100% stenosed.   LIMA-LAD graft was visualized by angiography and is normal in caliber and moderate in size.  The graft exhibits no disease.   SVG-dRCA (PDA-PAV bifurcation) graft was visualized by angiography and is large.  The graft exhibits minimal luminal irregularities.   CULPRIT LESION: RPDA lesion  is 60% stenosed. ->  Thrombotic stenosis, unable to really determine initial lesion and vessel size.   There is mild left ventricular systolic dysfunction.  The left ventricular ejection fraction is 45-50% by visual estimate.  LV end diastolic pressure is normal.   There is no aortic valve stenosis. POST-OPERATIVE DIAGNOSIS:   Culprit lesion is thrombotic 90% occlusion in the anastomosis of the SVG-PDA (the graft actually anastomosis at the bifurcation of the PL and PDA and the distal RCA.  There is thrombus in the PDA branch but not in the PL branch with TIMI-3 flow => very difficult high graft to access.  Plan was to treat with Aggrastat and reevaluate early next week. Native RCA has extensive 60% proximal to mid stenosis and is occluded in the distal vessel. Native the left main has distal 20% stenosis branches into the LAD and LCx.  LCx courses as a large caliber bifurcating OM branch with a AV groove branch that is free of disease.  The ostial LAD has 80% stenosis then has severe 90+ percent stenosis and a small diagonal just after SP1 it is then 100% occluded shortly after this branch. LIMA to LAD is widely patent with antegrade flow down to the apex and retrograde flow back up to a small diagonal branch with trivial flow. Normal LVEDP of 12 mmHg.  Mild inferior hypokinesis but very poor visualization.  Recommend 2D echocardiogram.  Otherwise EF appears to be maybe 45 to 50%.     Patient Profile     82 y.o. male with a hx of  coronary artery disease status post coronary artery bypass surgery in 2012 (LIMA to LAD; SVG sequential to PDA and PLB), hypertension, hypercholesteremia, hypothyroidism, polyneuropathy, and sleep apnea who is being seen 01/26/2023 for the evaluation of chest pain   Assessment & Plan    # STEMI LHC on 01/26/2023 showed thrombus in the SVG to PDA.  Hehas remained stable overnight. Tolerating DAPT without issue. No evidence of further bleeding. Did have some PVCs on telemetry. Will monitor clincially but doesn't seem to be causing him a problem. He is receiving metoprolol.  We will monitor him today, but if his renal function and CBC remain stable, he will likely be able to discharge tomorrow.  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  Metop Tartrate changed to toprol 25mg  today. He has been restarted on a lower dose of losartan at 25mg  (taking 100mg  at home).  We will monitor his renal function today. May require low dose lasix daily with PRN sliding scale for volume control on discharge.  - Cardiac rehab.  - BMP in the AM - continue metoprolol - low dose PRN diuretic on DC  # R Groin Hematoma s/p Cath Hgb noted to be 10 today, down from 12 yesterday.Likely equilibration after fluids as he has remained HDS. He does have a hematoma extending from R groin into scrotum, but this has not been enlarging. Increase in BUN likely related to hematoma resorption. We will continue to monitor him clinically.  - warm compresses and elevation - repeat CBC tomorrow - H+H if he becomes HDS.   # CAD s/p CABG - ASA and Plavix for 1 year  For questions or updates, please contact [place] Please consult [email or web address] for contact info under   Signed, Delene Ruffini, MD  01/28/2023,  3:05 PM

## 2023-01-28 NOTE — Progress Notes (Signed)
   Heart Failure Stewardship Pharmacist Progress Note   PCP: Colon Branch, MD PCP-Cardiologist: Jenkins Rouge, MD    HPI:  82 yo M with PMH of CAD s/p CABG in 2012, HTN, HLD, hypothyroidism, polyneuropathy, and sleep apnea.   Presented to the ED on 3/30 with chest pain and shortness of breath. EKG performed in triage and ST elevations in inferior lateral leads. Code STEMI activated. Culprit lesion is thrombotic 90% occlusion in the anastamosis of the SVG-PDA. Also found thrombus in PDA branch, difficult access. Planned to treat with aggrastat and reevaluate next week. ECHO 3/31 showed LVEF reduced to 40-45% (60-65% in 05/2022), global hypokinesis, G1DD, and RV normal. Post cath complicated by R groin bleed. Vascular consulted. Duplex ordered and no active bleeding or pseudoaneurysm, no surgery recommended. With resolution of symptoms, planned medical management and will not return to cath lab.   Current HF Medications: Beta Blocker: metoprolol XL 25 mg daily ACE/ARB/ARNI: losartan 25 mg daily  Prior to admission HF Medications: Beta blocker: metoprolol tartrate 12.5 mg daily ACE/ARB/ARNI: losartan 100 mg daily  Pertinent Lab Values: Serum creatinine 0.96, BUN 24, Potassium 4.3, Sodium 135  Vital Signs: Weight: 289 lbs Blood pressure: 110/60s  Heart rate: 80s   Medication Assistance / Insurance Benefits Check: Does the patient have prescription insurance?  Yes Type of insurance plan: [place] [employer]  Does the patient qualify for medication assistance through manufacturers or grants?   Pending Eligible grants and/or patient assistance programs: pending Medication assistance applications in progress: none  Medication assistance applications approved: none Approved medication assistance renewals will be completed by: pending  Outpatient Pharmacy:  Prior to admission outpatient pharmacy: [facility] Is the patient willing to use [facility] at discharge? Yes Is the patient willing to  transition their outpatient pharmacy to utilize a [facility] outpatient pharmacy?   No    Assessment: 1. 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 NYHA class II symptoms. - Not volume overloaded on exam, watch off diuretics. Daily weights. Strict I/Os.  - Agree with transitioning to metoprolol XL 25 mg daily - Agree with resuming PTA losartan at reduced dose - Consider adding low dose MRA prior to discharge - Consider starting SGLT2i prior to discharge if BP continues to limit other GDMT   Plan: 1) Medication changes recommended at this time: - Agree with changes - Add Farxiga 10 mg daily  2) Patient assistance: - Farxiga/Jardiance copay $47 - Entresto copay $47  3)  Education  - Patient has been educated on current HF medications and potential additions to HF medication regimen - Patient verbalizes understanding that over the next few months, these medication doses may change and more medications may be added to optimize HF regimen - Patient has been educated on basic disease state pathophysiology and goals of therapy\  Kerby Nora, PharmD, BCPS Heart Failure Stewardship Pharmacist Phone [phone]

## 2023-01-29 ENCOUNTER — Inpatient Hospital Stay (HOSPITAL_COMMUNITY): Payer: Medicare HMO

## 2023-01-29 ENCOUNTER — Encounter: Payer: Self-pay | Admitting: *Deleted

## 2023-01-29 DIAGNOSIS — Z006 Encounter for examination for normal comparison and control in clinical research program: Secondary | ICD-10-CM

## 2023-01-29 DIAGNOSIS — I2119 ST elevation (STEMI) myocardial infarction involving other coronary artery of inferior wall: Secondary | ICD-10-CM | POA: Diagnosis not present

## 2023-01-29 DIAGNOSIS — I5020 Unspecified systolic (congestive) heart failure: Secondary | ICD-10-CM | POA: Diagnosis not present

## 2023-01-29 DIAGNOSIS — I97630 Postprocedural hematoma of a circulatory system organ or structure following a cardiac catheterization: Secondary | ICD-10-CM | POA: Diagnosis not present

## 2023-01-29 DIAGNOSIS — I25720 Atherosclerosis of autologous artery coronary artery bypass graft(s) with unstable angina pectoris: Secondary | ICD-10-CM | POA: Diagnosis not present

## 2023-01-29 LAB — CBC WITH DIFFERENTIAL/PLATELET
Abs Immature Granulocytes: 0.04 10*3/uL (ref 0.00–0.07)
Basophils Absolute: 0.1 10*3/uL (ref 0.0–0.1)
Basophils Relative: 1 %
Eosinophils Absolute: 0.4 10*3/uL (ref 0.0–0.5)
Eosinophils Relative: 5 %
HCT: 29.5 % — ABNORMAL LOW (ref 39.0–52.0)
Hemoglobin: 10.3 g/dL — ABNORMAL LOW (ref 13.0–17.0)
Immature Granulocytes: 1 %
Lymphocytes Relative: 23 %
Lymphs Abs: 1.9 10*3/uL (ref 0.7–4.0)
MCH: 33 pg (ref 26.0–34.0)
MCHC: 34.9 g/dL (ref 30.0–36.0)
MCV: 94.6 fL (ref 80.0–100.0)
Monocytes Absolute: 0.7 10*3/uL (ref 0.1–1.0)
Monocytes Relative: 8 %
Neutro Abs: 5.4 10*3/uL (ref 1.7–7.7)
Neutrophils Relative %: 62 %
Platelets: 132 10*3/uL — ABNORMAL LOW (ref 150–400)
RBC: 3.12 MIL/uL — ABNORMAL LOW (ref 4.22–5.81)
RDW: 13.2 % (ref 11.5–15.5)
WBC: 8.4 10*3/uL (ref 4.0–10.5)
nRBC: 0.4 % — ABNORMAL HIGH (ref 0.0–0.2)

## 2023-01-29 LAB — CBC
HCT: 27.3 % — ABNORMAL LOW (ref 39.0–52.0)
Hemoglobin: 9.5 g/dL — ABNORMAL LOW (ref 13.0–17.0)
MCH: 32.6 pg (ref 26.0–34.0)
MCHC: 34.8 g/dL (ref 30.0–36.0)
MCV: 93.8 fL (ref 80.0–100.0)
Platelets: 109 10*3/uL — ABNORMAL LOW (ref 150–400)
RBC: 2.91 MIL/uL — ABNORMAL LOW (ref 4.22–5.81)
RDW: 13.2 % (ref 11.5–15.5)
WBC: 10.1 10*3/uL (ref 4.0–10.5)
nRBC: 0 % (ref 0.0–0.2)

## 2023-01-29 LAB — BASIC METABOLIC PANEL
Anion gap: 7 (ref 5–15)
BUN: 24 mg/dL — ABNORMAL HIGH (ref 8–23)
CO2: 23 mmol/L (ref 22–32)
Calcium: 8.5 mg/dL — ABNORMAL LOW (ref 8.9–10.3)
Chloride: 104 mmol/L (ref 98–111)
Creatinine, Ser: 0.82 mg/dL (ref 0.61–1.24)
GFR, Estimated: >60 mL/min (ref >60)
Glucose, Bld: 118 mg/dL — ABNORMAL HIGH (ref 70–99)
Potassium: 3.9 mmol/L (ref 3.5–5.1)
Sodium: 134 mmol/L — ABNORMAL LOW (ref 135–145)

## 2023-01-29 MED ORDER — IOHEXOL 350 MG/ML SOLN
100.0000 mL | Freq: Once | INTRAVENOUS | Status: AC | PRN
  Administered 2023-01-29: 100 mL via INTRAVENOUS

## 2023-01-29 MED ORDER — POLYETHYLENE GLYCOL 3350 17 G PO PACK
17.0000 g | PACK | Freq: Every day | ORAL | Status: DC
  Administered 2023-01-29 – 2023-01-30 (×2): 17 g via ORAL
  Filled 2023-01-29 (×2): qty 1

## 2023-01-29 MED ORDER — COLESTIPOL HCL 1 G PO TABS
1.0000 g | ORAL_TABLET | Freq: Every day | ORAL | Status: DC
  Administered 2023-01-30: 1 g via ORAL
  Filled 2023-01-29: qty 1

## 2023-01-29 MED ORDER — DAPAGLIFLOZIN PROPANEDIOL 10 MG PO TABS
10.0000 mg | ORAL_TABLET | Freq: Every day | ORAL | Status: DC
  Administered 2023-01-29 – 2023-01-30 (×2): 10 mg via ORAL
  Filled 2023-01-29 (×2): qty 1

## 2023-01-29 NOTE — Progress Notes (Signed)
Rounding Note    Patient Name: Steven Munoz Date of Encounter: 01/28/2023  [place] Cardiologist: Jenkins Rouge, MD   Subjective   Really the only complaint he has is the scrotal swelling make it very difficult for him to sit and to walk.  But otherwise no chest pain or pressure.  No dyspnea.  Inpatient Medications    Scheduled Meds:  allopurinol  100 mg Oral Daily   aspirin EC  81 mg Oral Daily   clopidogrel  75 mg Oral Q breakfast   colestipol  3 g Oral Daily   famotidine  40 mg Oral QHS   levothyroxine  125 mcg Oral Q0600   losartan  25 mg Oral Daily   magnesium oxide  400 mg Oral Daily   metoprolol succinate  25 mg Oral Daily   potassium chloride  10 mEq Oral Daily   rosuvastatin  40 mg Oral Daily   sodium chloride flush  3 mL Intravenous Q12H   Continuous Infusions:  sodium chloride     promethazine (PHENERGAN) injection (IM or IVPB) Stopped (01/26/23 2332)   PRN Meds: sodium chloride, acetaminophen, hydrocortisone cream, ondansetron (ZOFRAN) IV, promethazine (PHENERGAN) injection (IM or IVPB), sodium chloride flush   Vital Signs    Vitals:   01/28/23 0049 01/28/23 0352 01/28/23 0840 01/28/23 1107  BP: 110/63 (!) 123/55 (!) 113/53 114/67  Pulse: 82 61 84 85  Resp: 18 18 17 18   Temp: 99.3 F (37.4 C) 97.8 F (36.6 C) 97.8 F (36.6 C) 97.8 F (36.6 C)  TempSrc: Oral Oral Oral Oral  SpO2: 96% 94% 99%   Weight:  131.1 kg    Height:        Intake/Output Summary (Last 24 hours) at 01/28/2023 1505 Last data filed at 01/28/2023 1300 Gross per 24 hour  Intake 480 ml  Output --  Net 480 ml      01/28/2023    3:52 AM 01/27/2023    4:00 AM 01/26/2023    3:05 AM  Last 3 Weights  Weight (lbs) 289 lb 277 lb 5.4 oz 229 lb 0.9 oz  Weight (kg) 131.09 kg 125.8 kg 103.9 kg      Telemetry    Sinus rhythm with PVCs.- Personally Reviewed  ECG    No new studies- Personally Reviewed   Physical Exam   GEN: No acute distress.   Neck: No  JVD Cardiac: normal S1 and S2, RRR, 2/6 holosystolic murmur   Respiratory: CTAB, nonlabored, good air movement.  Room air.   GI: Soft, , non-distended; obese, with significant right lower abdominal wall ecchymosis GU: significant scrotal ecchymosis and swelling.  Very tender, wearing supportive underwear. Skin: Right lower quadrant suprapubic scrotal right upper thigh medial ecchymosis. MS: Trivial pedal edema.   No deformity. Neuro:  Nonfocal  Psych: Normal affect   Labs    High Sensitivity Troponin:   Recent Labs  Lab 01/25/23 2320 01/26/23 0328  TROPONINIHS 561* >24,000*     Chemistry Recent Labs  Lab 01/25/23 2320 01/26/23 0328 01/27/23 0454 01/28/23 0223  NA 136  --  138 135  K 3.5  --  4.2 4.3  CL 103  --  106 104  CO2 24  --  23 22  GLUCOSE 142*  --  165* 127*  BUN 21  --  16 24*  CREATININE 0.96 0.83 0.98 0.96  CALCIUM 9.3  --  8.6* 8.5*  ALBUMIN  --   --  3.3*  --  GFRNONAA >60 >60 >60 >60  ANIONGAP 9  --  9 9    Lipids  Recent Labs  Lab 01/28/23 0223  CHOL 86  TRIG 72  HDL 37*  LDLCALC 35  CHOLHDL 2.3    Hematology Recent Labs  Lab 01/26/23 1919 01/27/23 0454 01/28/23 0223  WBC 12.8* 12.2* 11.4*  RBC 4.24 3.73* 3.20*  HGB 13.7 12.3* 10.3*  HCT 39.1 35.0* 30.7*  MCV 92.2 93.8 95.9  MCH 32.3 33.0 32.2  MCHC 35.0 35.1 33.6  RDW 13.2 13.5 13.4  PLT 161 119* 115*   Thyroid No results for input(s): "TSH", "FREET4" in the last 168 hours.  BNPNo results for input(s): "BNP", "PROBNP" in the last 168 hours.  DDimer No results for input(s): "DDIMER" in the last 168 hours.   Radiology    VAS [place] GROIN PSEUDOANEURYSM  Result Date: 01/27/2023  ARTERIAL PSEUDOANEURYSM  Patient Name:  Steven Munoz  Date of Exam:   01/26/2023 Medical Rec #: [id number]         Accession #:    [id number] Date of Birth: Jul 30, 1941          Patient Gender: M Patient Age:   82 years Exam Location:  [facility] Procedure:      VAS [place] GROIN PSEUDOANEURYSM Referring  Phys: Harold Barban --------------------------------------------------------------------------------  Exam: Right groin History: S/p catheterization. Limitations: Abdominal girth, pain post compression Comparison Study: No prior study Performing Technologist: Sharion Dove RVS  Examination Guidelines: A complete evaluation includes B-mode imaging, spectral Doppler, color Doppler, and power Doppler as needed of all accessible portions of each vessel. Bilateral testing is considered an integral part of a complete examination. Limited examinations for reoccurring indications may be performed as noted.  Findings: A mixed echogenic structure measuring approximately 6.4 cm x 7.4 cm is visualized at the Right groin with ultrasound characteristics of a hematoma.  Summary: No evidence of pseudoaneurysm, AVF or DVT  Diagnosing physician: Monica Martinez MD Electronically signed by Monica Martinez MD on 01/27/2023 at 10:53:57 AM.   --------------------------------------------------------------------------------    Final     Cardiac Studies   01/26/23 ECHO:  1. Left ventricular ejection fraction, by estimation, is 40 to 45%. The left ventricle has mildly decreased function. The left ventricle  demonstrates global hypokinesis. Left ventricular diastolic parameters are consistent with Grade I diastolic dysfunction (impaired relaxation).   2. Right ventricular systolic function is normal. The right ventricular size is normal.   3. Left atrial size was moderately dilated.   4. The mitral valve is normal in structure. Trivial mitral valve regurgitation. No evidence of mitral stenosis.   5. The aortic valve was not well visualized. Aortic valve regurgitation is not visualized. No aortic stenosis is present.   01/26/23 Cath:   Mid LM to Ost LAD lesion is 20% stenosed with 20% stenosed side branch in Ost Cx.   Ost LAD to Prox LAD lesion is 75% stenosed.  Prox LAD to Mid LAD lesion is 95% stenosed with 99% stenosed side  branch in 1st Diag. Mid LAD lesion is 100% stenosed.  2nd Diag lesion is 80% stenosed.   Prox RCA lesion is 60% stenosed.  Mid RCA to Dist RCA lesion is 100% stenosed.   LIMA-LAD graft was visualized by angiography and is normal in caliber and moderate in size.  The graft exhibits no disease.   SVG-dRCA (PDA-PAV bifurcation) graft was visualized by angiography and is large.  The graft exhibits minimal luminal irregularities.   CULPRIT LESION:  RPDA lesion is 60% stenosed. ->  Thrombotic stenosis, unable to really determine initial lesion and vessel size.   There is mild left ventricular systolic dysfunction.  The left ventricular ejection fraction is 45-50% by visual estimate.  LV end diastolic pressure is normal.   There is no aortic valve stenosis. POST-OPERATIVE DIAGNOSIS:   Culprit lesion is thrombotic 90% occlusion in the anastomosis of the SVG-PDA (the graft actually anastomosis at the bifurcation of the PL and PDA and the distal RCA.  There is thrombus in the PDA branch but not in the PL branch with TIMI-3 flow => very difficult high graft to access.  Plan was to treat with Aggrastat and reevaluate early next week. Native RCA has extensive 60% proximal to mid stenosis and is occluded in the distal vessel. Native the left main has distal 20% stenosis branches into the LAD and LCx.  LCx courses as a large caliber bifurcating OM branch with a AV groove branch that is free of disease.  The ostial LAD has 80% stenosis then has severe 90+ percent stenosis and a small diagonal just after SP1 it is then 100% occluded shortly after this branch. LIMA to LAD is widely patent with antegrade flow down to the apex and retrograde flow back up to a small diagonal branch with trivial flow. Normal LVEDP of 12 mmHg.  Mild inferior hypokinesis but very poor visualization.  Recommend 2D echocardiogram.  Otherwise EF appears to be maybe 45 to 50%.     Patient Profile     82 y.o. male with a hx of coronary artery  disease status post coronary artery bypass surgery in 2012 (LIMA to LAD; SVG sequential to PDA and PLB), hypertension, hypercholesteremia, hypothyroidism, polyneuropathy, and sleep apnea who is being seen 01/26/2023 for the evaluation of chest pain   Assessment & Plan    # Inferior STEMI / CAD of native & graft vessels with unstable angina LHC on 01/26/2023 showed thrombus in the SVG to PDA Anastomosis.  => Treated with IV Aggrastat without percutaneous intervention => Remains hemodynamically stable despite mild drop in hemoglobin.  No clear evidence of bleeding however we will CTA of the abdomen pelvis and lower extremities to ensure no evidence of extravasation.  Was seen by vascular surgery and thought to be stable. => If CT does not show sign of bleed, would still want to see a stable hemoglobin before discharge. ASA Plavix DAPT for 1 year.  However if he has significant bleeding bruising, could potentially stop aspirin after 3 to 6 months. Increased his Crestor to 40 mg, however with elevated LP(a), may want to consider referral for PCSK9 inhibitor.  Discussed the possibility of Evolve MI trial. Continue current doses of Toprol and losartan.  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  Metop Tartrate changed to toprol 25mg  today. He has been restarted on a lower dose of losartan at 25mg  (taking 100mg  at home).  We will monitor his renal function today.  --> May require low dose lasix daily with PRN sliding scale for volume control on discharge.  - Cardiac rehab. => Would definitely benefit from phase 2 - BMP in the AM -> anticipate titrating up ARB dose and consider converting to Delene Loll Delene Loll co-pay would be $47 -> recommend evaluation in the outpatient setting.) - continue Toprol - low dose PRN diuretic on DC Per heart failure service recommendation, will add SGLT2 inhibitor -> Farxiga 10 mg daily (co-pay would be $47)  #  R Groin Hematoma s/p  Cath -> Hemoglobin now dropped to 9.5 -Slowly trending down from 13.7 on arrival.  Would like to see a stable hemoglobin for discharge.  Given the significant hematoma we have ordered CT angiogram of the abdomen pelvis. Remains stable.  Continue to monitor CBC-once CBC is stable, and a CT scan does not show signs of bleed, would be okay for discharge.   # CAD s/p CABG - ASA and Plavix for 1 year  For questions or updates, please contact [place] Please consult [email or web address] for contact info under   Signed,   Leonie Man, MD, MS Glenetta Hew, M.D., M.S. Interventional Cardiologist  [place]  Pager # [phone] Phone # [phone] [address] [place], [place] [address]

## 2023-01-29 NOTE — Plan of Care (Signed)

## 2023-01-29 NOTE — Progress Notes (Signed)
  Progress Note    01/29/2023 6:43 AM Hospital Day 3  Subjective:  says he is sore but still better than a couple of days ago.  Says he is wearing his underwear b/c of swelling in his testicles.  Says he has walked in the room.   Afebrile [id number] systolic  Vitals:   [id number] 01/29/23 0508  BP: (!) 128/43 (!) 131/59  Pulse: (!) 57 80  Resp: 17 17  Temp: 97.7 F (36.5 C) 97.7 F (36.5 C)  SpO2:  95%    Physical Exam: General:  laying in bed in no distress Lungs:  non labored Extremities:  right foot is warm and well perfused.   Right groin soft; extensive ecchymosis right groin, flank, pubis, thigh.    CBC    Component Value Date/Time   WBC 10.1 01/29/2023 0024   RBC 2.91 (L) 01/29/2023 0024   HGB 9.5 (L) 01/29/2023 0024   HCT 27.3 (L) 01/29/2023 0024   PLT 109 (L) 01/29/2023 0024   MCV 93.8 01/29/2023 0024   MCH 32.6 01/29/2023 0024   MCHC 34.8 01/29/2023 0024   RDW 13.2 01/29/2023 0024   LYMPHSABS 1,828 11/01/2022 1159   MONOABS 0.4 05/09/2022 0915   EOSABS 92 11/01/2022 1159   BASOSABS 40 11/01/2022 1159    BMET    Component Value Date/Time   NA 134 (L) 01/29/2023 0024   NA 139 09/06/2022 1057   K 3.9 01/29/2023 0024   CL 104 01/29/2023 0024   CO2 23 01/29/2023 0024   GLUCOSE 118 (H) 01/29/2023 0024   BUN 24 (H) 01/29/2023 0024   BUN 17 09/06/2022 1057   CREATININE 0.82 01/29/2023 0024   CREATININE 1.14 11/01/2022 1159   CALCIUM 8.5 (L) 01/29/2023 0024   GFRNONAA >60 01/29/2023 0024   GFRAA  06/01/2009 0445    >60        The eGFR has been calculated using the MDRD equation. This calculation has not been validated in all clinical situations. eGFR's persistently <60 mL/min signify possible Chronic Kidney Disease.    INR    Component Value Date/Time   INR 1.4 05/29/2009 1320     Intake/Output Summary (Last 24 hours) at 01/29/2023 0643 Last data filed at 01/28/2023 1400 Gross per 24 hour  Intake 720 ml  Output --  Net 720 ml      Assessment/Plan:  82 y.o. male  with right groin hematoma s/p heart catheterization   Hospital Day 3  -pt doing well this morning.  Right foot is warm and well perfused.  Right groin is soft.  Hgb down slightly from yesterday, however, blood pressure continues to improve from [id number] systolic yesterday afternoon to [id number] systolic this am.  Currently no indication for surgery. -discussed elevating testicles on towels to help with swelling.  -no psa on duplex on 01/26/2023   Leontine Locket, PA-C [facility] [phone] 01/29/2023 6:43 AM

## 2023-01-29 NOTE — Progress Notes (Signed)
Pt sitting in chair agreeable to ambulate in hallway. Pt walked 96ft w/o AD, only complaint was that he feels uncomfortable.   Pt was educated on  Plavix and ASA use, wt restrictions, no baths/daily wash-ups, s/s of infection, ex guidelines (progressive walking and resistance exercise), s/s to stop exercising, NTG use and calling 911, heart healthy diet, and CRPII. Pt received MI book and materials on exercise, diet, and CRPII. Will refer to [facility].   Steven Munoz 01/29/2023 11:23 AM  [id number]

## 2023-01-29 NOTE — Progress Notes (Signed)
   Heart Failure Stewardship Pharmacist Progress Note   PCP: Colon Branch, MD PCP-Cardiologist: Jenkins Rouge, MD    HPI:  82 yo M with PMH of CAD s/p CABG in 2012, HTN, HLD, hypothyroidism, polyneuropathy, and sleep apnea.   Presented to the ED on 3/30 with chest pain and shortness of breath. EKG performed in triage and ST elevations in inferior lateral leads. Code STEMI activated. Culprit lesion is thrombotic 90% occlusion in the anastamosis of the SVG-PDA. Also found thrombus in PDA branch, difficult access. Planned to treat with aggrastat and reevaluate next week. ECHO 3/31 showed LVEF reduced to 40-45% (60-65% in 05/2022), global hypokinesis, G1DD, and RV normal. Post cath complicated by R groin bleed. Vascular consulted. Duplex ordered and no active bleeding or pseudoaneurysm, no surgery recommended. With resolution of symptoms, planned medical management and will not return to cath lab.   Current HF Medications: Beta Blocker: metoprolol XL 25 mg daily ACE/ARB/ARNI: losartan 25 mg daily  Prior to admission HF Medications: Beta blocker: metoprolol tartrate 12.5 mg daily ACE/ARB/ARNI: losartan 100 mg daily  Pertinent Lab Values: Serum creatinine 0.82, BUN 24, Potassium 3.9, Sodium 134  Vital Signs: Weight: 293 lbs Blood pressure: 130/50s  Heart rate: 70-80s   Medication Assistance / Insurance Benefits Check: Does the patient have prescription insurance?  Yes Type of insurance plan: [employer]  Does the patient qualify for medication assistance through manufacturers or grants?   Pending Eligible grants and/or patient assistance programs: pending Medication assistance applications in progress: none  Medication assistance applications approved: none Approved medication assistance renewals will be completed by: pending  Outpatient Pharmacy:  Prior to admission outpatient pharmacy: [facility] Is the patient willing to use [facility] at discharge? Yes Is the patient willing to  transition their outpatient pharmacy to utilize a [facility] outpatient pharmacy?   No    Assessment: 1. 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 NYHA class II symptoms. - Not volume overloaded on exam, watch off diuretics. Daily weights. Strict I/Os.  - Continue metoprolol XL 25 mg daily - Continue losartan 25 mg daily - Consider adding low dose MRA today with BP improving - Consider starting SGLT2i prior to discharge if BP continues to limit other GDMT   Plan: 1) Medication changes recommended at this time: - Add spironolactone 12.5 mg daily  2) Patient assistance: - Farxiga/Jardiance copay $47 - Entresto copay $47  3)  Education  - Patient has been educated on current HF medications and potential additions to HF medication regimen - Patient verbalizes understanding that over the next few months, these medication doses may change and more medications may be added to optimize HF regimen - Patient has been educated on basic disease state pathophysiology and goals of therapy\  Kerby Nora, PharmD, BCPS Heart Failure Stewardship Pharmacist Phone [phone]

## 2023-01-29 NOTE — Care Management Important Message (Signed)
Important Message  Patient Details  Name: Steven Munoz MRN: [id number] Date of Birth: 14-Mar-1941   Medicare Important Message Given:  Yes     [name] 01/29/2023, 7:48 AM

## 2023-01-29 NOTE — Research (Signed)
EVOLVE  Informed Consent   Subject Name: Craig Westermann Coluccio  Subject met inclusion and exclusion criteria.  The informed consent form, study requirements and expectations were reviewed with the subject and questions and concerns were addressed prior to the signing of the consent form.  The subject verbalized understanding of the trial requirements.  The subject agreed to participate in the EVOLVE  trial and signed the informed consent at 10:14 AM  on 01-29-2023.  The informed consent was obtained prior to performance of any protocol-specific procedures for the subject.  A copy of the signed informed consent was given to the subject and a copy was placed in the subject's medical record.   [place] Francy Mcilvaine, Research Coordinator      Protocol # [id number]   Subject ID#     [id number]                          DAY 1 Date:       29-Jan-2023  Randomization:   Geographic Region:    [place]  Randomization Date:     29-Jan-2023  Randomization Time:  10:30     Treatment type Assigned   []  Treatment                                   [x]   Control          Protocol # [id number]  Subject Initial ID#:   [id number]                             Age in years: 83  *Demographics are found in the [facility] EMR source.   Protocol Version: v2.00 [id number]  Were all Eligibility Criteria Met?  [x]  Yes  []   No  Screening Visit Date:                [id number]

## 2023-01-29 NOTE — TOC Progression Note (Signed)
Transition of Care [facility]) - Progression Note    Patient Details  Name: Steven Munoz MRN: [id number] Date of Birth: [birth year]  Transition [facility] Care [facility]) CM/SW Contact  Zenon Mayo, RN Phone Number: 01/29/2023, 4:46 PM  Clinical Narrative:      Transition of Care [facility]) Screening Note   Patient Details  Name: Steven Munoz Date of Birth: 10/24/1941   Transition of Care [facility]) CM/SW Contact:    Zenon Mayo, RN Phone Number: 01/29/2023, 4:46 PM    Transition of Care Department [facility]) has reviewed patient and no TOC needs have been identified at this time. We will continue to monitor patient advancement through interdisciplinary progression rounds. If new patient transition needs arise, please place a TOC consult. from home with wife, STEMI, s/p cath.    Expected Discharge Plan: Home/Self Care Barriers to Discharge: Continued Medical Work up  Expected Discharge Plan and Services   Discharge Planning Services: CM Consult   Living arrangements for the past 2 months: Single Family Home                                       Social Determinants of Health (SDOH) Interventions SDOH Screenings   Food Insecurity: No Food Insecurity (01/26/2023)  Housing: Low Risk  (01/27/2023)  Transportation Needs: No Transportation Needs (01/26/2023)  Utilities: Not At Risk (01/26/2023)  Alcohol Screen: Low Risk  (01/27/2023)  Depression (PHQ2-9): Low Risk  (12/16/2022)  Financial Resource Strain: Low Risk  (01/27/2023)  Physical Activity: Sufficiently Active (11/14/2022)  Social Connections: Socially Integrated (08/07/2021)  Stress: No Stress Concern Present (08/07/2021)  Tobacco Use: Medium Risk (01/27/2023)    Readmission Risk Interventions     No data to display

## 2023-01-30 ENCOUNTER — Other Ambulatory Visit (HOSPITAL_COMMUNITY): Payer: Self-pay

## 2023-01-30 DIAGNOSIS — Z951 Presence of aortocoronary bypass graft: Secondary | ICD-10-CM

## 2023-01-30 DIAGNOSIS — T82867A Thrombosis of cardiac prosthetic devices, implants and grafts, initial encounter: Secondary | ICD-10-CM | POA: Diagnosis not present

## 2023-01-30 DIAGNOSIS — Z79890 Hormone replacement therapy (postmenopausal): Secondary | ICD-10-CM | POA: Diagnosis not present

## 2023-01-30 DIAGNOSIS — I2119 ST elevation (STEMI) myocardial infarction involving other coronary artery of inferior wall: Secondary | ICD-10-CM | POA: Diagnosis not present

## 2023-01-30 DIAGNOSIS — I25810 Atherosclerosis of coronary artery bypass graft(s) without angina pectoris: Secondary | ICD-10-CM | POA: Diagnosis not present

## 2023-01-30 DIAGNOSIS — I97630 Postprocedural hematoma of a circulatory system organ or structure following a cardiac catheterization: Secondary | ICD-10-CM | POA: Diagnosis not present

## 2023-01-30 DIAGNOSIS — R710 Precipitous drop in hematocrit: Secondary | ICD-10-CM | POA: Diagnosis not present

## 2023-01-30 DIAGNOSIS — E7800 Pure hypercholesterolemia, unspecified: Secondary | ICD-10-CM | POA: Diagnosis not present

## 2023-01-30 DIAGNOSIS — I5020 Unspecified systolic (congestive) heart failure: Secondary | ICD-10-CM | POA: Diagnosis not present

## 2023-01-30 DIAGNOSIS — E039 Hypothyroidism, unspecified: Secondary | ICD-10-CM | POA: Diagnosis not present

## 2023-01-30 DIAGNOSIS — I110 Hypertensive heart disease with heart failure: Secondary | ICD-10-CM | POA: Diagnosis not present

## 2023-01-30 DIAGNOSIS — Z79899 Other long term (current) drug therapy: Secondary | ICD-10-CM | POA: Diagnosis not present

## 2023-01-30 DIAGNOSIS — G4733 Obstructive sleep apnea (adult) (pediatric): Secondary | ICD-10-CM

## 2023-01-30 DIAGNOSIS — I25110 Atherosclerotic heart disease of native coronary artery with unstable angina pectoris: Secondary | ICD-10-CM | POA: Diagnosis not present

## 2023-01-30 DIAGNOSIS — I5022 Chronic systolic (congestive) heart failure: Secondary | ICD-10-CM | POA: Diagnosis not present

## 2023-01-30 LAB — BASIC METABOLIC PANEL
Anion gap: 6 (ref 5–15)
BUN: 19 mg/dL (ref 8–23)
CO2: 23 mmol/L (ref 22–32)
Calcium: 8.6 mg/dL — ABNORMAL LOW (ref 8.9–10.3)
Chloride: 105 mmol/L (ref 98–111)
Creatinine, Ser: 0.94 mg/dL (ref 0.61–1.24)
GFR, Estimated: >60 mL/min (ref >60)
Glucose, Bld: 106 mg/dL — ABNORMAL HIGH (ref 70–99)
Potassium: 3.7 mmol/L (ref 3.5–5.1)
Sodium: 134 mmol/L — ABNORMAL LOW (ref 135–145)

## 2023-01-30 LAB — CBC
HCT: 28.5 % — ABNORMAL LOW (ref 39.0–52.0)
Hemoglobin: 9.7 g/dL — ABNORMAL LOW (ref 13.0–17.0)
MCH: 32.1 pg (ref 26.0–34.0)
MCHC: 34 g/dL (ref 30.0–36.0)
MCV: 94.4 fL (ref 80.0–100.0)
Platelets: 125 10*3/uL — ABNORMAL LOW (ref 150–400)
RBC: 3.02 MIL/uL — ABNORMAL LOW (ref 4.22–5.81)
RDW: 13.1 % (ref 11.5–15.5)
WBC: 8.8 10*3/uL (ref 4.0–10.5)
nRBC: 0 % (ref 0.0–0.2)

## 2023-01-30 MED ORDER — METOPROLOL SUCCINATE ER 25 MG PO TB24
25.0000 mg | ORAL_TABLET | Freq: Every day | ORAL | 1 refills | Status: DC
  Filled 2023-01-30: qty 90, 90d supply, fill #0

## 2023-01-30 MED ORDER — ROSUVASTATIN CALCIUM 40 MG PO TABS
40.0000 mg | ORAL_TABLET | Freq: Every day | ORAL | 1 refills | Status: DC
  Filled 2023-01-30: qty 90, 90d supply, fill #0

## 2023-01-30 MED ORDER — CLOPIDOGREL BISULFATE 75 MG PO TABS
75.0000 mg | ORAL_TABLET | Freq: Every day | ORAL | 2 refills | Status: DC
  Filled 2023-01-30: qty 90, 90d supply, fill #0

## 2023-01-30 MED ORDER — LOSARTAN POTASSIUM 25 MG PO TABS
25.0000 mg | ORAL_TABLET | Freq: Every day | ORAL | 1 refills | Status: DC
  Filled 2023-01-30: qty 90, 90d supply, fill #0

## 2023-01-30 MED ORDER — FUROSEMIDE 20 MG PO TABS
20.0000 mg | ORAL_TABLET | Freq: Every day | ORAL | 1 refills | Status: AC | PRN
  Filled 2023-01-30: qty 30, 30d supply, fill #0

## 2023-01-30 MED ORDER — NITROGLYCERIN 0.4 MG SL SUBL
0.4000 mg | SUBLINGUAL_TABLET | SUBLINGUAL | 2 refills | Status: DC | PRN
  Filled 2023-01-30: qty 25, 7d supply, fill #0

## 2023-01-30 MED ORDER — DAPAGLIFLOZIN PROPANEDIOL 10 MG PO TABS
10.0000 mg | ORAL_TABLET | Freq: Every day | ORAL | 0 refills | Status: DC
  Filled 2023-01-30: qty 30, 30d supply, fill #0

## 2023-01-30 NOTE — Discharge Summary (Addendum)
Discharge Summary    Patient ID: COLT ZYSK MRN: [id number]; DOB: 1941-04-22  Admit date: 01/25/2023 Discharge date: 01/30/2023  PCP:  Colon Branch, MD   [facility] Cardiologist:  Jenkins Rouge, MD     Discharge Diagnoses    Principal Problem:   Acute ST elevation myocardial infarction (STEMI) of inferior wall Active Problems:   Coronary artery disease involving native coronary artery of native heart with unstable angina pectoris   CORONARY ARTERY BYPASS GRAFT, HX OF   Postoperative hematoma involving circulatory system following cardiac catheterization   HFrEF (heart failure with reduced ejection fraction)   Essential hypertension   Hyperlipidemia with target LDL less than 70   OSA on CPAP   Diagnostic Studies/Procedures    01/26/23 ECHO:  1. Left ventricular ejection fraction, by estimation, is 40 to 45%. The left ventricle has mildly decreased function. The left ventricle  demonstrates global hypokinesis. Left ventricular diastolic parameters are consistent with Grade I diastolic dysfunction (impaired relaxation).   2. Right ventricular systolic function is normal. The right ventricular size is normal.   3. Left atrial size was moderately dilated.   4. The mitral valve is normal in structure. Trivial mitral valve regurgitation. No evidence of mitral stenosis.   5. The aortic valve was not well visualized. Aortic valve regurgitation is not visualized. No aortic stenosis is present.     Cath: 01/26/2023    Mid LM to Ost LAD lesion is 20% stenosed with 20% stenosed side branch in Ost Cx.   Ost LAD to Prox LAD lesion is 75% stenosed.  Prox LAD to Mid LAD lesion is 95% stenosed with 99% stenosed side branch in 1st Diag. Mid LAD lesion is 100% stenosed.  2nd Diag lesion is 80% stenosed.   Prox RCA lesion is 60% stenosed.  Mid RCA to Dist RCA lesion is 100% stenosed.   LIMA-LAD graft was visualized by angiography and is normal in caliber and moderate in  size.  The graft exhibits no disease.   SVG-dRCA (PDA-PAV bifurcation) graft was visualized by angiography and is large.  The graft exhibits minimal luminal irregularities.   CULPRIT LESION: RPDA lesion is 60% stenosed. ->  Thrombotic stenosis, unable to really determine initial lesion and vessel size.   There is mild left ventricular systolic dysfunction.  The left ventricular ejection fraction is 45-50% by visual estimate.  LV end diastolic pressure is normal.   There is no aortic valve stenosis.   =================================================================== POST-OPERATIVE DIAGNOSIS:   Culprit lesion is thrombotic 90% occlusion in the anastomosis of the SVG-PDA (the graft actually anastomosis at the bifurcation of the PL and PDA and the distal RCA.  There is thrombus in the PDA branch but not in the PL branch with TIMI-3 flow => very difficult high graft to access.  Plan was to treat with Aggrastat and reevaluate early next week. Native RCA has extensive 60% proximal to mid stenosis and is occluded in the distal vessel. Native the left main has distal 20% stenosis branches into the LAD and LCx.  LCx courses as a large caliber bifurcating OM branch with a AV groove branch that is free of disease.  The ostial LAD has 80% stenosis then has severe 90+ percent stenosis and a small diagonal just after SP1 it is then 100% occluded shortly after this branch. LIMA to LAD is widely patent with antegrade flow down to the apex and retrograde flow back up to a small diagonal branch with trivial flow.  Normal LVEDP of 12 mmHg.  Mild inferior hypokinesis but very poor visualization.  Recommend 2D echocardiogram.  Otherwise EF appears to be maybe 45 to 50%.     PLAN OF CARE: Admit to inpatient  -> run Aggrastat to discontinue at 0830 hrs., pull sheath 2 hours later.=> Reassess EKG and symptoms, would probably consider relook catheterization on Monday to reassess the anastomotic lesion. Check 2D  echo Increase statin to 40 mg rosuvastatin, increase beta-blocker to metoprolol titrate 12.5 mg twice daily  Diagnostic Dominance: Right  _____________   History of Present Illness     Steven Munoz is a 82 y.o. male with  with coronary artery disease status post coronary artery bypass surgery in 2012 (LIMA to LAD; SVG sequential to PDA and PLB), hypertension, hypercholesteremia, hypothyroidism, polyneuropathy, and sleep apnea who was seen 01/26/2023 for the evaluation of chest pain.   [name] stated after waking up from sleep while seated watching a basketball game he stretched and then immediately noticed an ache at the center of his chest.  The pain did not radiate to any other location or associated with any other symptoms such as shortness of breath, nausea, sweating, palpitations, or lightheadedness.  He checked his blood pressure and noticed that the systolic pressure was elevated in the 160s and eventually 180s.   His chest pain did resolve after 10-15 minutes.  He decided to have his wife drive him to our emergency department mostly due to his blood pressure.  At this time, patient is chest pain free.  Patient reports he took his aspirin this evening.   In the emergency department his first ECG revealed ST elevations in the inferior leads with depressions in the inferior leads, mild ST elevation in the anterior leads.  Initial HS-troponin was 561 ng/L. In the emergency department, patient was eventually loaded with high-dose aspirin and given a heparin bolus.  Code STEMI was called.       Hospital Course     Consultants: [facility]   Inferior STEMI CAD s/p CABG -- underwent cardiac cath noted above with culprit lesion being thrombotic occlusion of 90% in the anastomosis of the SVG-PDA (the graft actually anastomosis at the bifurcation of the PL and PDA and the distal RCA. There was thrombus in the PDA branch but not in the PL branch with TIMI-3 flow. Treated with aggrastat without PCI.  No recurrent chest pain. Worked with CR.  -- plan for DAPT with ASA/plavix for at least one year, statin, Toprol, losartan   HFrEF ICM -- echo showed LVEF of 40-45%, global hypokinesis, g1DD -- GDMT: Toprol 25mg  daily, losartan 25mg  daily, farxiga, lasix 20mg  daily PRN. Consider transition to [facility] as outpatient if blood pressures tolerate  HLD -- LDL 35, HDL 37 -- Crestor 40mg  daily   Right Groin hematoma -- seen VVS with recommends for conservative management. Did have a drop in Hgb and repeat CT angio AO/Bifem showed evolving hematoma in the right groin extending to involve the medical aspect of the right thigh, no acute change. Hgb remained stable prior to discharge.   Patient was seen by Dr. Ellyn Hack and deemed stable for discharge home. Follow up arranged in the office. Medications sent to [facility]. Educated by pharmD prior to discharge.   Did the patient have an acute coronary syndrome (MI, NSTEMI, STEMI, etc) this admission?:  Yes  AHA/ACC Clinical Performance & Quality Measures: Aspirin prescribed? - Yes ADP Receptor Inhibitor (Plavix/Clopidogrel, Brilinta/Ticagrelor or Effient/Prasugrel) prescribed (includes medically managed patients)? - Yes Beta Blocker prescribed? - Yes High Intensity Statin (Lipitor 40-80mg  or Crestor 20-40mg ) prescribed? - Yes EF assessed during THIS hospitalization? - Yes For EF <40%, was ACEI/ARB prescribed? - Yes For EF <40%, Aldosterone Antagonist (Spironolactone or Eplerenone) prescribed? - Not Applicable (EF >/= [id number]) Cardiac Rehab Phase II ordered (including medically managed patients)? - Yes   The patient will be scheduled for a TOC follow up appointment in 10-14 days.  A message has been sent to the [facility] and Scheduling Pool at the office where the patient should be seen for follow up.  _____________  Discharge Vitals Blood pressure (!) 109/39, pulse 78, temperature 98.5 F (36.9 C), temperature source  Oral, resp. rate 18, height 6\' 1"  (1.854 m), weight 134.1 kg, SpO2 95 %.  Filed Weights   01/28/23 0352 01/29/23 0025 01/30/23 0424  Weight: 131.1 kg 133.3 kg 134.1 kg   Physical Exam    GEN: No acute distress.   Neck: No JVD Cardiac: normal S1 and S2, RRR, 2/6 holosystolic murmur   Respiratory: CTAB, nonlabored, good air movement.  Room air.   GI: Soft, , non-distended; obese, with significant right lower abdominal wall ecchymosis GU: significant scrotal ecchymosis and swelling.  Very tender, wearing supportive underwear. Skin: Right lower quadrant suprapubic scrotal right upper thigh medial ecchymosis. MS: Trivial pedal edema.   No deformity. Neuro:  Nonfocal  Psych: Normal affect   Labs & Radiologic Studies    CBC Recent Labs    01/29/23 1248 01/30/23 0028  WBC 8.4 8.8  NEUTROABS 5.4  --   HGB 10.3* 9.7*  HCT 29.5* 28.5*  MCV 94.6 94.4  PLT 132* 0000000*   Basic Metabolic Panel Recent Labs    01/29/23 0024 01/30/23 0028  NA 134* 134*  K 3.9 3.7  CL 104 105  CO2 23 23  GLUCOSE 118* 106*  BUN 24* 19  CREATININE 0.82 0.94  CALCIUM 8.5* 8.6*   Liver Function Tests No results for input(s): "AST", "ALT", "ALKPHOS", "BILITOT", "PROT", "ALBUMIN" in the last 72 hours. No results for input(s): "LIPASE", "AMYLASE" in the last 72 hours. High Sensitivity Troponin:   Recent Labs  Lab 01/25/23 2320 01/26/23 0328  TROPONINIHS 561* >24,000*    BNP Invalid input(s): "POCBNP" D-Dimer No results for input(s): "DDIMER" in the last 72 hours. Hemoglobin A1C No results for input(s): "HGBA1C" in the last 72 hours. Fasting Lipid Panel Recent Labs    01/28/23 0223  CHOL 86  HDL 37*  LDLCALC 35  TRIG 72  CHOLHDL 2.3   Thyroid Function Tests No results for input(s): "TSH", "T4TOTAL", "T3FREE", "THYROIDAB" in the last 72 hours.  Invalid input(s): "FREET3" _____________  CT ANGIO AO+BIFEM W & OR WO CONTRAST  Result Date: 01/29/2023 CLINICAL DATA:  Concern for bleeding  into the right thigh following recent cardiac catheterization. EXAM: CT ANGIOGRAPHY OF ABDOMINAL AORTA WITH ILIOFEMORAL RUNOFF TECHNIQUE: Multidetector CT imaging of the abdomen, pelvis and lower extremities was performed using the standard protocol during bolus administration of intravenous contrast. Multiplanar CT image reconstructions and MIPs were obtained to evaluate the vascular anatomy. RADIATION DOSE REDUCTION: This exam was performed according to the departmental dose-optimization program which includes automated exposure control, adjustment of the mA and/or kV according to patient size and/or use of iterative reconstruction technique. CONTRAST:  176mL OMNIPAQUE IOHEXOL 350 MG/ML SOLN COMPARISON:  None Available. FINDINGS:  VASCULAR No evidence of thoracic aortic aneurysm or dissection with measurements as follows. Sequela of previous median sternotomy and CABG with extensive calcifications within the native coronary arteries. Borderline cardiomegaly. No pericardial effusion. Although this examination was not tailored for the evaluation the pulmonary arteries, there are no discrete filling defects within the central pulmonary arterial tree to suggest central pulmonary embolism. Normal caliber of the main pulmonary artery. Thoracic aortic measurements: SINOTUBULAR JUNCTION: 31 mm as measured in greatest oblique short axis coronal dimension. PROXIMAL ASCENDING THORACIC AORTA: 32 mm as measured in greatest oblique short axis axial dimension (axial image 14, series 5) at the level of the main pulmonary artery PROXIMAL DESCENDING THORACIC AORTA: 30 mm as measured in greatest oblique short axis axial dimension at the level of the main pulmonary artery. DISTAL DESCENDING THORACIC AORTA: 26 mm as measured in greatest oblique short axis axial dimension at the level of the diaphragmatic hiatus. Review of the MIP images confirms the above findings. _________________________________________________________ Aorta: There  is a moderate amount mixed calcified and noncalcified atherosclerotic plaque within a normal caliber abdominal aorta, not resulting in hemodynamically significant stenosis. No abdominal aortic dissection or perivascular stranding. Celiac: Widely patent without hemodynamically significant narrowing. Conventional branching pattern. SMA: There is a minimal amount of calcified atherosclerotic plaque involving the origin of the SMA, not resulting in hemodynamically significant stenosis. Conventional branching pattern. The distal tributaries of the SMA appear widely patent without discrete intraluminal filling defect to suggest distal embolism. Renals: Solitary bilaterally; the bilateral renal arteries are widely patent without hemodynamically significant narrowing. No vessel irregularity to suggest FMD. IMA: Remains patent. RIGHT Lower Extremity Inflow: There is a minimal amount of calcified atherosclerotic plaque involving the right common iliac artery, not resulting in hemodynamically significant stenosis. The right internal iliac artery is mildly diseased though patent and of normal caliber. The right external iliac artery is tortuous but of normal caliber and widely patent without hemodynamically significant narrowing. Outflow: The right common femoral artery is widely patent without a hemodynamically significant narrowing. There is an evolving hematoma within the right groin, extending to involve the medial aspect of the right upper thigh without discrete area of contrast extravasation, though note, evaluation degraded secondary to streak artifact associated with the patient's right total hip replacement. Dominant component of the hematoma measures approximately 7.9 x 3.8 x 8.4 cm (axial image 177, series 5; coronal image 49, series 9). The right deep femoral artery is of normal caliber and widely patent without hemodynamically significant narrowing. There is a minimal amount of calcified atherosclerotic plaque  involving the distal aspect of the right superficial femoral artery at the level of the adductor canal, not resulting in hemodynamically significant stenosis. The right above and below-knee popliteal arteries are of normal caliber and widely patent without hemodynamically significant narrowing, though note, evaluation is degraded secondary to significant streak artifact associated with the patient's right total knee prosthesis. Runoff: Three-vessel runoff scratched at two-vessel runoff to the right lower leg and foot as the right-sided anterior tibial artery occludes at the level of the mid lower leg though the anterior tibial artery vascular distribution is slightly reconstituted via the peroneal artery. Right-sided dorsalis pedis artery is not identified. No discrete lumen filling defects to suggest distal embolism. _________________________________________________________ LEFT Lower Extremity Inflow: There is a minimal amount of atherosclerotic plaque involving the distal aspect of the left common iliac artery, not resulting in hemodynamically significant stenosis. The left internal iliac artery is of normal caliber and widely patent without hemodynamically significant  narrowing. The left external iliac artery is tortuous but of normal caliber and widely patent without hemodynamically significant narrowing. Outflow: The left common femoral artery is widely patent without a hemodynamically significant narrowing. The left deep femoral artery is of normal caliber and widely patent without hemodynamically significant narrowing. There is a minimal amount of calcified atherosclerotic plaque involving the distal aspect of the left superficial femoral artery at the level of the adductor canal, not resulting in hemodynamically significant narrowing The left above and below-knee popliteal arteries are of normal caliber and widely patent without a hemodynamically significant narrowing. Runoff: Predominantly two-vessel  runoff to the left lower leg and foot as the anterior tibial artery appears atretic ultimately joining a dominant peroneal artery which supplies a dorsalis pedis artery which appears patent to the level of the forefoot. No discrete intraluminal filling defects to suggest distal embolism. Veins: Several prominent varicosities are noted about the anterior aspect of the right thigh. Sequela of previous right-sided venous harvesting. The IVC and pelvic venous systems appear patent. Review of the MIP images confirms the above findings. _________________________________________________________ NON-VASCULAR Non-Vascular Findings: Evaluation of the abdominal organs is limited to the arterial phase of enhancement. Mediastinum/Lymph Nodes: Partially calcified mediastinal and right hilar lymph nodes, the sequela of previous granulomatous infection. No bulky mediastinal, hilar or axillary lymphadenopathy. Lungs/Pleura: There is a punctate granuloma involving the medial basilar aspect of the right lower lobe (image 48, series 5)Fair o with associated partially calcified mediastinal and right hilar lymph nodes, the sequela of previous granulomatous infection. Minimal subsegmental atelectasis involving the bilateral costophrenic angles. No discrete focal airspace opacities. No pleural effusion or pneumothorax. The central pulmonary airways appear widely patent. Hepatobiliary: There is diffuse decreased attenuation of the hepatic parenchyma suggestive of hepatic steatosis. There is mild atrophy involving the medial segment of the left lobe of the liver. No discrete hepatic lesions. Extensive cholelithiasis without evidence of gallbladder wall thickening or pericholecystic stranding. No intra or extrahepatic biliary duct dilatation. No ascites. Pancreas: Normal appearance of the pancreas. Spleen: Punctate granulomas within otherwise normal-appearing spleen. Adrenals/Urinary Tract: There is symmetric enhancement of the bilateral  kidneys. No evidence of nephrolithiasis. No discrete renal lesions. There is a minimal amount of likely age and body habitus related bilateral perinephric stranding. No urinary obstruction. Normal appearance of the bilateral adrenal glands. There is diffuse thickening of the urinary bladder wall, potentially accentuated due to underdistention. Stomach/Bowel: Rather extensive colonic diverticulosis involving the sigmoid colon without evidence superimposed acute diverticulitis. Moderate colonic stool burden without evidence of enteric obstruction. The cecum is noted to be located within the right mid abdomen. Normal appearance of the terminal ileum. The appendix is not visualized, however there is no pericecal inflammatory change. Tiny hiatal hernia. No pneumoperitoneum, pneumatosis or portal venous gas. Lymphatic: No bulky retroperitoneal, mesenteric, pelvic or inguinal lymphadenopathy. Reproductive: Normal appearance of the prostate gland, though note, evaluation degraded secondary to significant streak artifact associated with the patient's right total hip replacement. Punctate phlebolith overlies the right hemipelvis. No free fluid the pelvic cul-de-sac. Other: There is an evolving hematoma within the right groin, extending to involve the medial aspect of the right upper thigh without discrete area of contrast extravasation, though note, evaluation degraded secondary to streak artifact associated with the patient's right total hip replacement. Dominant component of the hematoma measures approximately 7.9 x 3.8 x 8.4 cm (axial image 177, series 5; coronal image 49, series 9). Musculoskeletal: No acute or aggressive osseous abnormalities. Moderate to severe multilevel lumbar spine DDD,  worse at L2-L3 and L3-L4 with disc space height loss, endplate irregularity and sclerosis. Mild scoliotic curvature of the thoracolumbar spine, presumably degenerative in etiology. Stigmata of dish within the thoracic spine. Post  right total hip replacement without evidence of hardware failure or loosening. Post right total knee replacement and replacement of the medial compartment the left knee. Dermal calcification noted about the anterior aspect of the distal left lower leg (image 451, series 5). IMPRESSION: VASCULAR IMPRESSION: 1. Evolving hematoma within the right groin, extending to involve the medial aspect of the right upper thigh measuring approximately 8.4 cm, without discrete area of contrast extravasation, though note, evaluation is degraded secondary to streak artifact associated with the patient's right total hip replacement. 2. Scattered atherosclerotic plaque within a normal caliber abdominal aorta. Aortic Atherosclerosis (ICD10-I70.0). 3. Two-vessel runoff to the bilateral lower legs and feet as described above. The right-sided dorsalis pedis artery is not identified. The left-sided dorsalis pedis artery is reconstituted via a dominant peroneal artery. 4. Sequela of previous median sternotomy and CABG with extensive calcifications within the native coronary arteries. NONVASCULAR IMPRESSION: 1. No acute findings within the abdomen or pelvis. 2. Colonic diverticulosis without evidence superimposed acute diverticulitis. 3. Suspected hepatic steatosis. 4. Cholelithiasis without evidence of cholecystitis. 5. Sequela of previous granulomatous infection as detailed above. Electronically Signed   By: Sandi Mariscal M.D.   On: 01/29/2023 14:01   VAS [place] GROIN PSEUDOANEURYSM  Result Date: 01/27/2023  ARTERIAL PSEUDOANEURYSM  Patient Name:  Steven Munoz  Date of Exam:   01/26/2023 Medical Rec #: [id number]         Accession #:    [id number] Date of Birth: 12-Mar-1941          Patient Gender: M Patient Age:   82 years Exam Location:  [facility] Procedure:      VAS [place] GROIN PSEUDOANEURYSM Referring Phys: Harold Barban --------------------------------------------------------------------------------  Exam: Right groin History: S/p  catheterization. Limitations: Abdominal girth, pain post compression Comparison Study: No prior study Performing Technologist: Sharion Dove RVS  Examination Guidelines: A complete evaluation includes B-mode imaging, spectral Doppler, color Doppler, and power Doppler as needed of all accessible portions of each vessel. Bilateral testing is considered an integral part of a complete examination. Limited examinations for reoccurring indications may be performed as noted.  Findings: A mixed echogenic structure measuring approximately 6.4 cm x 7.4 cm is visualized at the Right groin with ultrasound characteristics of a hematoma.  Summary: No evidence of pseudoaneurysm, AVF or DVT  Diagnosing physician: Monica Martinez MD Electronically signed by Monica Martinez MD on 01/27/2023 at 10:53:57 AM.   --------------------------------------------------------------------------------    Final    DG Chest Port 1 View  Result Date: 01/25/2023 CLINICAL DATA:  Chest pain. EXAM: PORTABLE CHEST 1 VIEW COMPARISON:  Chest radiograph dated 09/03/2022. FINDINGS: Mild eventration of the right hemidiaphragm. Minimal left lung base atelectasis. No focal consolidation, pleural effusion, or pneumothorax. The cardiac silhouette is within normal limits. Median sternotomy wires. No acute osseous pathology. IMPRESSION: No active disease. Electronically Signed   By: Anner Crete M.D.   On: 01/25/2023 23:53   Disposition   Pt is being discharged home today in good condition.  Follow-up Plans & Appointments     Follow-up Information     [facility]. Go in 17 day(s).   Specialty: Cardiology Why: Hospital follow up 02/13/2023 @ 2 pm PLEASE bring a current medication list to appointment FREE valet parking, Entrance C, off  [place] Contact information: [address] [id number] mc [place]        Josue Hector, MD Follow up on  02/24/2023.   Specialty: Cardiology Why: at 10am for your follow up appt Contact information: [address] [address] [phone]         Colon Branch, MD. Go on 02/04/2023.   Specialty: Internal Medicine Why: @11 :40 am with Lonzo Candy information: [address] [place] [address] [phone]                Discharge Instructions     Amb Referral to Cardiac Rehabilitation   Complete by: As directed    Diagnosis: STEMI   After initial evaluation and assessments completed: Virtual Based Care may be provided alone or in conjunction with Phase 2 Cardiac Rehab based on patient barriers.: Yes   Intensive Cardiac Rehabilitation (ICR) [place] location only OR Traditional Cardiac Rehabilitation (TCR) *If criteria for ICR are not met will enroll in TCR [facility] only): Yes   Diet - low sodium heart healthy   Complete by: As directed    Increase activity slowly   Complete by: As directed    No wound care   Complete by: As directed        Discharge Medications   Allergies as of 01/30/2023       Reactions   Povidone-iodine Rash   Ofloxacin Other (See Comments)   Affected sleep with eye drop formulation   Tape Rash, Other (See Comments)   Skin tears easily Cloth adhesive tape-per patient        Medication List     STOP taking these medications    hydrochlorothiazide 25 MG tablet Commonly known as: HYDRODIURIL   ibuprofen 200 MG tablet Commonly known as: ADVIL   metoprolol tartrate 25 MG tablet Commonly known as: LOPRESSOR   sodium chloride 0.65 % nasal spray Commonly known as: OCEAN       TAKE these medications    allopurinol 100 MG tablet Commonly known as: ZYLOPRIM Take 1 tablet (100 mg total) by mouth daily.   aspirin EC 81 MG tablet Take 81 mg by mouth daily.   clopidogrel 75 MG tablet Commonly known as: PLAVIX Take 1 tablet (75 mg total) by mouth daily with breakfast. Start taking on: January 31, 2023   colestipol 1 g tablet Commonly known as: COLESTID Take 1 g by mouth daily.   COQ10 PO Take 1 capsule by mouth daily.   famotidine 40 MG tablet Commonly known as: PEPCID Take 40 mg by mouth at bedtime.   Farxiga 10 MG Tabs tablet Generic drug: dapagliflozin propanediol Take 1 tablet (10 mg total) by mouth daily. Start taking on: January 31, 2023   FISH OIL PO Take 1 capsule by mouth daily.   fluorouracil 5 % cream Commonly known as: EFUDEX Apply 1 application  topically daily as needed (facial rash).   furosemide 20 MG tablet Commonly known as: Lasix Take 1 tablet (20 mg total) by mouth daily as needed.   levothyroxine 125 MCG tablet Commonly known as: SYNTHROID Take 1 tablet (125 mcg total) by mouth daily before breakfast.   losartan 25 MG tablet Commonly known as: COZAAR Take 1 tablet (25 mg total) by mouth daily. Start taking on: January 31, 2023 What changed:  medication strength how much to take   magnesium oxide 400 (240 Mg) MG tablet Commonly known as: MAG-OX Take  400 mg by mouth at bedtime.   metoprolol succinate 25 MG 24 hr tablet Commonly known as: TOPROL-XL Take 1 tablet (25 mg total) by mouth daily. Start taking on: January 31, 2023   nitroGLYCERIN 0.4 MG SL tablet Commonly known as: Nitrostat Place 1 tablet (0.4 mg total) under the tongue every 5 (five) minutes as needed.   potassium chloride 10 MEQ tablet Commonly known as: KLOR-CON Take 1 tablet (10 mEq total) by mouth daily. What changed: when to take this   PreserVision AREDS 2+Multi Vit Caps Take 1 capsule by mouth 2 (two) times daily.   rosuvastatin 40 MG tablet Commonly known as: CRESTOR Take 1 tablet (40 mg total) by mouth daily. Start taking on: January 31, 2023 What changed:  medication strength how much to take   VITAMIN B-12 PO Take 1 tablet by mouth daily.           Outstanding Labs/Studies   N/a   Duration of Discharge Encounter   Greater than 30 minutes  including physician time.  Signed, Reino Bellis, NP 01/30/2023, 1:44 PM   ATTENDING ATTESTATION  I have seen, examined and evaluated the patient this morning on rounds, and referred to the plan to Reino Bellis, NP.  Physical examination and chart evaluation performed personally by me.    After reviewing all the available data and chart, we discussed the patients laboratory, study & physical findings as well as symptoms in detail.  I agree with her findings, awell as history, impressions, and recommendations as per our discussion.    Finally ready for discharge as his hemoglobin levels.  Very reassuring results of CT scan yesterday showing no active extravasation.  He does have pretty significant post cath hematoma with spread of the hematoma into the right lateral abdominal wall as well as the groin and scrotum.  This is probably the most concerning symptom. He did have a mostly completed in.  Heart with medical therapy for thrombus in the PDA beyond the graft.  This was treated medically and he has not had a symptoms since.  We have titrated medications accordingly see yesterday's progress note for final adjustments. Ready for discharge.    Leonie Man, MD, MS Glenetta Hew, M.D., M.S. Interventional Cardiologist  [place]  Pager # [phone] Phone # [phone] [address] [place], [place] [address]

## 2023-01-30 NOTE — Progress Notes (Signed)
Patient complains of shortness of breath. Lungs diminished at bases but otherwise clear to auscultation. No precipitating event identified. 2L O2 [place] applied for comfort. EKG obtained. Notified on call physician - no new orders at this time. Patient would like update from medical team regarding cause of shortness of breath when available.

## 2023-01-30 NOTE — TOC Transition Note (Signed)
Transition of Care [facility]) - CM/SW Discharge Note   Patient Details  Name: Steven Munoz MRN: [id number] Date of Birth: 07/11/1941  [facility]) CM/SW Contact:  Zenon Mayo, RN Phone Number: 01/30/2023, 2:30 PM   Clinical Narrative:    Patient is for dc today, he has no needs.    Final next level of care: Home/Self Care Barriers to Discharge: Continued Medical Work up   Patient Goals and CMS Choice      Discharge Placement                         Discharge Plan and Services Additional resources added to the After Visit Summary for     Discharge Planning Services: CM Consult                                 Social Determinants of Health (SDOH) Interventions SDOH Screenings   Food Insecurity: No Food Insecurity (01/26/2023)  Housing: Low Risk  (01/27/2023)  Transportation Needs: No Transportation Needs (01/26/2023)  Utilities: Not At Risk (01/26/2023)  Alcohol Screen: Low Risk  (01/27/2023)  Depression (PHQ2-9): Low Risk  (12/16/2022)  Financial Resource Strain: Low Risk  (01/27/2023)  Physical Activity: Sufficiently Active (11/14/2022)  Social Connections: Socially Integrated (08/07/2021)  Stress: No Stress Concern Present (08/07/2021)  Tobacco Use: Medium Risk (01/27/2023)     Readmission Risk Interventions     No data to display

## 2023-01-30 NOTE — Plan of Care (Signed)

## 2023-01-30 NOTE — Progress Notes (Signed)
   Heart Failure Stewardship Pharmacist Progress Note   PCP: Colon Branch, MD PCP-Cardiologist: Jenkins Rouge, MD    HPI:  82 yo M with PMH of CAD s/p CABG in 2012, HTN, HLD, hypothyroidism, polyneuropathy, and sleep apnea.   Presented to the ED on 3/30 with chest pain and shortness of breath. EKG performed in triage and ST elevations in inferior lateral leads. Code STEMI activated. Culprit lesion is thrombotic 90% occlusion in the anastamosis of the SVG-PDA. Also found thrombus in PDA branch, difficult access. Planned to treat with aggrastat and reevaluate next week. ECHO 3/31 showed LVEF reduced to 40-45% (60-65% in 05/2022), global hypokinesis, G1DD, and RV normal. Post cath complicated by R groin bleed. Vascular consulted. Duplex ordered and no active bleeding or pseudoaneurysm, no surgery recommended. With resolution of symptoms, planned medical management and will not return to cath lab.   Current HF Medications: Beta Blocker: metoprolol XL 25 mg daily ACE/ARB/ARNI: losartan 25 mg daily SGLT2i: Farxiga 10 mg daily  Prior to admission HF Medications: Beta blocker: metoprolol tartrate 12.5 mg daily ACE/ARB/ARNI: losartan 100 mg daily  Pertinent Lab Values: Serum creatinine 0.94, BUN 19, Potassium 3.7, Sodium 134  Vital Signs: Weight: 295 lbs (up 2 lbs from yesterday) Blood pressure: 110/50s  Heart rate: 70-80s   Medication Assistance / Insurance Benefits Check: Does the patient have prescription insurance?  Yes Type of insurance plan: [place] [employer]  Does the patient qualify for medication assistance through manufacturers or grants?   Pending Eligible grants and/or patient assistance programs: pending Medication assistance applications in progress: none  Medication assistance applications approved: none Approved medication assistance renewals will be completed by: pending  Outpatient Pharmacy:  Prior to admission outpatient [facility] Is the patient willing to use [facility] at discharge? Yes Is the patient willing to transition their outpatient pharmacy to utilize a [facility] outpatient pharmacy?   No    Assessment: 1. 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 NYHA class II symptoms. - Weight increasing and complaining of shortness of breath this AM. Consider adding IV  lasix.  - Continue metoprolol XL 25 mg daily - Continue losartan 25 mg daily - Consider adding low dose MRA prior to discharge - Continue Farxiga 10 mg daily   Plan: 1) Medication changes recommended at this time: - Add IV lasix  2) Patient assistance: - Farxiga/Jardiance copay $47 - Entresto copay $47  3)  Education  - Patient has been educated on current HF medications and potential additions to HF medication regimen - Patient verbalizes understanding that over the next few months, these medication doses may change and more medications may be added to optimize HF regimen - Patient has been educated on basic disease state pathophysiology and goals of therapy\  Kerby Nora, PharmD, BCPS Heart Failure Stewardship Pharmacist Phone [phone]

## 2023-01-30 NOTE — Progress Notes (Signed)
Pt in chair trying to stay comfortable, pt reports min SOB with activity. Pt has been referred to CRP2 at [facility].

## 2023-01-31 ENCOUNTER — Telehealth: Payer: Self-pay | Admitting: Cardiovascular Disease

## 2023-01-31 ENCOUNTER — Telehealth: Payer: Self-pay | Admitting: *Deleted

## 2023-01-31 ENCOUNTER — Encounter: Payer: Self-pay | Admitting: *Deleted

## 2023-01-31 ENCOUNTER — Ambulatory Visit: Payer: Self-pay

## 2023-01-31 NOTE — Transitions of Care (Post Inpatient/ED Visit) (Signed)
01/31/2023  Name: Steven Munoz MRN: [id number] DOB: 08/29/1941  Today's TOC FU Call Status: Today's TOC FU Call Status:: Successful TOC FU Call Competed TOC FU Call Complete Date: 01/31/23  Transition Care Management Follow-up Telephone Call Date of Discharge: 01/30/23 [facility] Type of Discharge: Inpatient Admission Primary Inpatient Discharge Diagnosis:: STEMI with cardiac catherization- with complication of significant (R) groin hematoma post-cath How have you been since you were released from the hospital?: Same ("I guess I am doing okay-- all this swelling around my groin from the hematoma is very painful. It is difficult to move/ sit.  I am so uncomfortable; they did not tell me how to care for this at home.  I called my heart doctor and hoping they call back") Any questions or concerns?: Yes Patient Questions/Concerns:: significant discomfort from hematoma site; does not know what to do to relieve/ help discomfort/ unable to move comfortably Patient Questions/Concerns Addressed: Other: (provided education around management of hematoma at home: ice with instructions to use thins sheet around ice pack to help with maintaining skin integrity in scrotum; confirmed using tylenol with minimal relief; use of diuretic/ K+)  Items Reviewed: Did you receive and understand the discharge instructions provided?: Yes (thoroughly reviewed with patient who verbalizes very good understanding of same) Medications obtained and verified?: Yes (Medications Reviewed) (Full medication review completed; no concerns or discrepancies identified; confirmed patient obtained/ is taking all newly Rx'd medications as instructed; self-manages medications and denies questions/ concerns around medications today) Any new allergies since your discharge?: No Dietary orders reviewed?: Yes Type of Diet Ordered:: Heart Healthy/ low salt Do you have support at home?: Yes People in Home:  spouse Name of Support/Comfort Primary Source: resides with spouse who is able to assist as needed/ indicated; reports he is essentially independent in self-care activities  Home Care and Equipment/Supplies: Were Home Health Services Ordered?: No Any new equipment or medical supplies ordered?: No  Functional Questionnaire: Do you need assistance with bathing/showering or dressing?: No Do you need assistance with meal preparation?: No Do you need assistance with eating?: No Do you have difficulty maintaining continence: No Do you need assistance with getting out of bed/getting out of a chair/moving?: Yes (wife assisting as needed-- only due to discomfort from scrotal swelling due to significant post-cardiac cath hematoma; confirmed not using assistive devices, is "steady" with ambulation) Do you have difficulty managing or taking your medications?: No  Follow up appointments reviewed: PCP Follow-up appointment confirmed?: Yes Date of PCP follow-up appointment?: 02/04/23 Follow-up Provider: PCP Specialist Hospital Follow-up appointment confirmed?: Yes Date of Specialist follow-up appointment?: 02/13/23 (verified this is recommended time frame for specialist follow up per discharge provider hospital notes) Follow-Up Specialty Provider:: cardiology provider Do you need transportation to your follow-up appointment?: No Do you understand care options if your condition(s) worsen?: Yes-patient verbalized understanding  SDOH Interventions Today    Flowsheet Row Most Recent Value  SDOH Interventions   Food Insecurity Interventions Intervention Not Indicated  Transportation Interventions Intervention Not Indicated  [spouse to provide transportation post-recent hospital discharge]      TOC Interventions Today    Flowsheet Row Most Recent Value  TOC Interventions   TOC Interventions Discussed/Reviewed TOC Interventions Discussed  [provided acute instructions for care of significant scrotal  swelling due to complications from cardiac cath,  provided my direct contact information should questions/ concerns/ needs arise post-TOC call, prior to RN CM telephone visit]      Interventions Today  Flowsheet Row Most Recent Value  Chronic Disease   Chronic disease during today's visit Other  [STEMI with post-cardiac catherization complication- significant hematoma requiring blood tranfusion/ significant scrotal swelling]  General Interventions   General Interventions Discussed/Reviewed General Interventions Discussed, Doctor Visits, Referral to Nurse, Communication with, Durable Medical Equipment (DME)  Doctor Visits Discussed/Reviewed Doctor Visits Discussed, Specialist, PCP  Durable Medical Equipment (DME) Dan Humphreys, Other  [cane and crutches-- does not currently use/ require assistive devices]  PCP/Specialist Visits Compliance with follow-up visit  Communication with RN  Exercise Interventions   Exercise Discussed/Reviewed Exercise Discussed  Education Interventions   Education Provided Provided Education  Provided Verbal Education On Medication, When to see the doctor, Other  [safe use of NTG,  use of diuretic prn,  importance of taking K+ with diuretic therapy,  daily weight management at home with weight gain guidelines and action action plan,  reviewed weight at home this morning: "284.4 lbs"]  Nutrition Interventions   Nutrition Discussed/Reviewed Nutrition Discussed  Pharmacy Interventions   Pharmacy Dicussed/Reviewed Pharmacy Topics Discussed  [Full medication review with updating medication list in EHR per patient report,  see interventions for "education provided"]  Safety Interventions   Safety Discussed/Reviewed Safety Discussed  [ambulation in setting of significant discomfort with scrotal swelling]      Caryl Pina, RN, BSN, CCRN Alumnus RN CM Care Coordination/ Transition of Care- [facility] Care Management [phone]: direct office

## 2023-01-31 NOTE — Telephone Encounter (Signed)
  Pt said, he is in pain and his surgery site is very swollen. He said, it is very hard for him to seat down. He was told this is normal but what he can do to feel less pain and to speed up the healing process

## 2023-01-31 NOTE — Chronic Care Management (AMB) (Signed)
   01/31/2023  Steven Munoz 01/28/1941 [id number]   Reason for Encounter: Patient is not currently enrolled in the CCM program. CCM status changed to previously enrolled.   Katha Cabal RN Care Manager/Chronic Care Management [phone]

## 2023-01-31 NOTE — Telephone Encounter (Signed)
Patient was cath on 3/31, as of today patient is complaining swelling at his surgery site. Right groin, right hip, and lower part of his stomach are bruised. Patient stated his testicles are the size of a grapefruit and it's difficult to sit down. Spoke with D.O.D. : Advised patient this is normal and apply cold compress at the site. Patient voiced understanding.

## 2023-01-31 NOTE — Transitions of Care (Post Inpatient/ED Visit) (Signed)
   01/31/2023  Name: Steven Munoz MRN: [id number] DOB: 11-21-1940  Today's TOC FU Call Status: Today's TOC FU Call Status:: Unsuccessul Call (1st Attempt) Unsuccessful Call (1st Attempt) Date: 01/31/23  Attempted to reach the patient regarding the most recent Inpatient visit; left HIPAA compliant voice message requesting call back  Follow Up Plan: Additional outreach attempts will be made to reach the patient to complete the Transitions of Care (Post Inpatient visit) call.   Caryl Pina, RN, BSN, CCRN Alumnus RN CM Care Coordination/ Transition of Care- [facility] Care Management [phone]: direct office

## 2023-02-03 NOTE — Progress Notes (Unsigned)
   Acute Office Visit - Hospital Follow-up  Subjective:     Patient ID: Steven Munoz, male    DOB: [birth year], 82 y.o.   MRN: [id number]  No chief complaint on file.   HPI Patient is in today for *** hospital follow-up. PCP not in-office today.   Admission: [facility] ED Discharge: 01/30/23 to home  Patient went to the ED on 01/25/23 for chest pain with no other associated symptoms. He has a history of coronary artery disease post CABG in 2012. He checked his BP at home and it was elevated to SBP 160-180s, so he decided to go to the ED for evaluation even though chest pain resolved before arrival. EKG on arrival showed ST elevation and initial troponin 561. Code STEMI was called and he had emergent heart cath showing 90% occlusion in anastomosis of SVG-PDA. Plan was to treat with IV Aggrastat and then discharged on aspirin/Plavix x 1 year. His Crestor and metoprolol were increased. EF 40-45% - currently on Toprol 25 mg daily, losartan 25 mg daily, Farxiga, lasix 20 mg daily PRN with plans to possibly change to [facility] if BP tolerates. He did end up having a right groin hematoma seen on CTA following drop in Hgb, but Hgb stabilized and no signs of extravasation. Scheduled to follow-up with cardiology next week.   ***       ROS All review of systems negative except what is listed in the HPI      Objective:    There were no vitals taken for this visit. {Vitals History (Optional):23777}  Physical Exam  No results found for any visits on 02/04/23.      Assessment & Plan:   Problem List Items Addressed This Visit   None   No orders of the defined types were placed in this encounter.   No follow-ups on file.  Clayborne Dana, NP

## 2023-02-04 ENCOUNTER — Encounter: Payer: Self-pay | Admitting: Family Medicine

## 2023-02-04 ENCOUNTER — Ambulatory Visit (INDEPENDENT_AMBULATORY_CARE_PROVIDER_SITE_OTHER): Payer: Medicare HMO | Admitting: Family Medicine

## 2023-02-04 VITALS — BP 133/73 | HR 67 | Ht 73.0 in | Wt 285.0 lb

## 2023-02-04 DIAGNOSIS — T148XXA Other injury of unspecified body region, initial encounter: Secondary | ICD-10-CM

## 2023-02-04 DIAGNOSIS — I252 Old myocardial infarction: Secondary | ICD-10-CM | POA: Diagnosis not present

## 2023-02-04 DIAGNOSIS — I499 Cardiac arrhythmia, unspecified: Secondary | ICD-10-CM | POA: Diagnosis not present

## 2023-02-04 DIAGNOSIS — D649 Anemia, unspecified: Secondary | ICD-10-CM | POA: Diagnosis not present

## 2023-02-04 DIAGNOSIS — R001 Bradycardia, unspecified: Secondary | ICD-10-CM | POA: Diagnosis not present

## 2023-02-04 DIAGNOSIS — S3022XA Contusion of scrotum and testes, initial encounter: Secondary | ICD-10-CM

## 2023-02-04 LAB — BASIC METABOLIC PANEL
BUN: 19 mg/dL (ref 6–23)
CO2: 25 mEq/L (ref 19–32)
Calcium: 9.1 mg/dL (ref 8.4–10.5)
Chloride: 104 mEq/L (ref 96–112)
Creatinine, Ser: 0.85 mg/dL (ref 0.40–1.50)
GFR: 81.11 mL/min (ref >60.00)
Glucose, Bld: 96 mg/dL (ref 70–99)
Potassium: 4.2 mEq/L (ref 3.5–5.1)
Sodium: 138 mEq/L (ref 135–145)

## 2023-02-04 LAB — CBC WITH DIFFERENTIAL/PLATELET
Basophils Absolute: 0 10*3/uL (ref 0.0–0.1)
Basophils Relative: 0.5 % (ref 0.0–3.0)
Eosinophils Absolute: 0.3 10*3/uL (ref 0.0–0.7)
Eosinophils Relative: 4.1 % (ref 0.0–5.0)
HCT: 34.6 % — ABNORMAL LOW (ref 39.0–52.0)
Hemoglobin: 11.9 g/dL — ABNORMAL LOW (ref 13.0–17.0)
Lymphocytes Relative: 26.5 % (ref 12.0–46.0)
Lymphs Abs: 2.1 10*3/uL (ref 0.7–4.0)
MCHC: 34.5 g/dL (ref 30.0–36.0)
MCV: 95.8 fl (ref 78.0–100.0)
Monocytes Absolute: 0.8 10*3/uL (ref 0.1–1.0)
Monocytes Relative: 9.7 % (ref 3.0–12.0)
Neutro Abs: 4.7 10*3/uL (ref 1.4–7.7)
Neutrophils Relative %: 59.2 % (ref 43.0–77.0)
Platelets: 241 10*3/uL (ref 150.0–400.0)
RBC: 3.61 Mil/uL — ABNORMAL LOW (ref 4.22–5.81)
RDW: 14.1 % (ref 11.5–15.5)
WBC: 7.9 10*3/uL (ref 4.0–10.5)

## 2023-02-04 NOTE — Patient Instructions (Signed)
VSS stable. EKG similar to hospital EKG.  Updating labs today.  No changes to medication today.  Keep upcoming cardiologist follow-up.

## 2023-02-05 ENCOUNTER — Ambulatory Visit: Payer: Self-pay

## 2023-02-05 DIAGNOSIS — G4733 Obstructive sleep apnea (adult) (pediatric): Secondary | ICD-10-CM | POA: Diagnosis not present

## 2023-02-05 NOTE — Patient Outreach (Signed)
  Care Coordination   Initial Visit Note   02/05/2023 Name: LAMBERTO ELGART MRN: [id number] DOB: [birth year]  Santiago Bumpers Morales is a 82 y.o. year old male who sees Drue Novel, Nolon Rod, MD for primary care. I spoke with  Bettey Mare by phone today.  What matters to the patients health and wellness today?  Follow up PCP visit completed on yesterday. Mr. Pecor reports everything is ok except where bleeding under skin post catheterization.Mr. Balboni states he had notified cardiology. He states he does not want to see another provider and will wait to see his cardiologist on 02/24/23.  He reports it is beginning to notice some improvement.  He has an appointment with [facility] on 02/13/23. Mr. Allenbaugh is without any questions at this time.  Goals Addressed             This Visit's Progress    Continue to improve post hospitalization       Interventions Today    Flowsheet Row Most Recent Value  Chronic Disease   Chronic disease during today's visit Congestive Heart Failure (CHF), Other  [post hospitalization STEMI 01/25/23-01/30/23]  General Interventions   General Interventions Discussed/Reviewed General Interventions Discussed, Doctor Visits  Doctor Visits Discussed/Reviewed Doctor Visits Discussed  PCP/Specialist Visits Compliance with follow-up visit  Education Interventions   Education Provided Provided Education  Provided Verbal Education On When to see the doctor  [encourgaed to contact provider with health questions or concerns. emphasized can see an advanced practice provider if needed prior to cardiologist appointment.]  Pharmacy Interventions   Pharmacy Dicussed/Reviewed Pharmacy Topics Reviewed            SDOH assessments and interventions completed:  No recently completed. No changes.   Care Coordination Interventions:  Yes, provided   Follow up plan: Follow up call scheduled for 02/21/23    Encounter Outcome:  Pt. Visit Completed   Kathyrn Sheriff, RN, MSN, BSN,  CCM [facility] Care Coordinator [phone]

## 2023-02-05 NOTE — Patient Instructions (Addendum)
Visit Information  Thank you for taking time to visit with me today. Please don't hesitate to contact me if I can be of assistance to you.   Following are the goals we discussed today:   Goals Addressed             This Visit's Progress    Continue to improve post hospitalization       Interventions Today    Flowsheet Row Most Recent Value  Chronic Disease   Chronic disease during today's visit Congestive Heart Failure (CHF), Other  [post hospitalization STEMI 01/25/23-01/30/23]  General Interventions   General Interventions Discussed/Reviewed General Interventions Discussed, Doctor Visits  Doctor Visits Discussed/Reviewed Doctor Visits Discussed  PCP/Specialist Visits Compliance with follow-up visit  Education Interventions   Education Provided Provided Education  Provided Verbal Education On When to see the doctor  [encourgaed to contact provider with health questions or concerns. emphasized can see an advanced practice provider if needed prior to cardiologist appointment.]  Pharmacy Interventions   Pharmacy Dicussed/Reviewed Pharmacy Topics Reviewed            Our next appointment is by telephone on 02/21/23 at 11:00 am  Please call the care guide team at [phone] if you need to cancel or reschedule your appointment.   If you are experiencing a Mental Health or Behavioral Health Crisis or need someone to talk to, please call the Suicide and Crisis Lifeline: 70   Kathyrn Sheriff, RN, MSN, BSN, CCM [facility] Care Coordinator [phone]

## 2023-02-06 ENCOUNTER — Encounter (HOSPITAL_COMMUNITY): Payer: Self-pay

## 2023-02-06 ENCOUNTER — Emergency Department (HOSPITAL_COMMUNITY): Payer: Medicare HMO

## 2023-02-06 ENCOUNTER — Other Ambulatory Visit: Payer: Self-pay

## 2023-02-06 ENCOUNTER — Other Ambulatory Visit (HOSPITAL_COMMUNITY): Payer: Self-pay

## 2023-02-06 ENCOUNTER — Emergency Department (HOSPITAL_COMMUNITY)
Admission: EM | Admit: 2023-02-06 | Discharge: 2023-02-06 | Disposition: A | Discharge status: Home / Self Care | Payer: Medicare HMO | Attending: Emergency Medicine | Admitting: Emergency Medicine

## 2023-02-06 DIAGNOSIS — E785 Hyperlipidemia, unspecified: Secondary | ICD-10-CM

## 2023-02-06 DIAGNOSIS — I509 Heart failure, unspecified: Secondary | ICD-10-CM | POA: Diagnosis not present

## 2023-02-06 DIAGNOSIS — I5020 Unspecified systolic (congestive) heart failure: Secondary | ICD-10-CM

## 2023-02-06 DIAGNOSIS — Z79899 Other long term (current) drug therapy: Secondary | ICD-10-CM | POA: Diagnosis not present

## 2023-02-06 DIAGNOSIS — Z7982 Long term (current) use of aspirin: Secondary | ICD-10-CM | POA: Insufficient documentation

## 2023-02-06 DIAGNOSIS — R030 Elevated blood-pressure reading, without diagnosis of hypertension: Secondary | ICD-10-CM | POA: Diagnosis not present

## 2023-02-06 DIAGNOSIS — I2510 Atherosclerotic heart disease of native coronary artery without angina pectoris: Secondary | ICD-10-CM | POA: Diagnosis not present

## 2023-02-06 DIAGNOSIS — Z7902 Long term (current) use of antithrombotics/antiplatelets: Secondary | ICD-10-CM | POA: Diagnosis not present

## 2023-02-06 DIAGNOSIS — I110 Hypertensive heart disease with heart failure: Secondary | ICD-10-CM | POA: Insufficient documentation

## 2023-02-06 DIAGNOSIS — I10 Essential (primary) hypertension: Secondary | ICD-10-CM

## 2023-02-06 DIAGNOSIS — Z951 Presence of aortocoronary bypass graft: Secondary | ICD-10-CM | POA: Insufficient documentation

## 2023-02-06 LAB — CBC
HCT: 35.4 % — ABNORMAL LOW (ref 39.0–52.0)
Hemoglobin: 12 g/dL — ABNORMAL LOW (ref 13.0–17.0)
MCH: 32.6 pg (ref 26.0–34.0)
MCHC: 33.9 g/dL (ref 30.0–36.0)
MCV: 96.2 fL (ref 80.0–100.0)
Platelets: 248 10*3/uL (ref 150–400)
RBC: 3.68 MIL/uL — ABNORMAL LOW (ref 4.22–5.81)
RDW: 14.1 % (ref 11.5–15.5)
WBC: 7.4 10*3/uL (ref 4.0–10.5)
nRBC: 0 % (ref 0.0–0.2)

## 2023-02-06 LAB — BASIC METABOLIC PANEL
Anion gap: 9 (ref 5–15)
BUN: 14 mg/dL (ref 8–23)
CO2: 22 mmol/L (ref 22–32)
Calcium: 9 mg/dL (ref 8.9–10.3)
Chloride: 106 mmol/L (ref 98–111)
Creatinine, Ser: 0.87 mg/dL (ref 0.61–1.24)
GFR, Estimated: >60 mL/min (ref >60)
Glucose, Bld: 104 mg/dL — ABNORMAL HIGH (ref 70–99)
Potassium: 3.9 mmol/L (ref 3.5–5.1)
Sodium: 137 mmol/L (ref 135–145)

## 2023-02-06 LAB — TROPONIN I (HIGH SENSITIVITY)
Troponin I (High Sensitivity): 37 ng/L — ABNORMAL HIGH (ref <18)
Troponin I (High Sensitivity): 34 ng/L — ABNORMAL HIGH (ref <18)

## 2023-02-06 MED ORDER — ENTRESTO 24-26 MG PO TABS
1.0000 | ORAL_TABLET | Freq: Two times a day (BID) | ORAL | 2 refills | Status: DC
  Filled 2023-02-06: qty 60, 30d supply, fill #0

## 2023-02-06 MED ORDER — HYDRALAZINE HCL 10 MG PO TABS
10.0000 mg | ORAL_TABLET | Freq: Two times a day (BID) | ORAL | 0 refills | Status: AC | PRN
  Filled 2023-02-06: qty 60, 30d supply, fill #0

## 2023-02-06 NOTE — Consult Note (Signed)
Cardiology Consultation   Patient ID: Steven Munoz MRN: [id number]; DOB: 07/08/1941  Admit date: 02/06/2023 Date of Consult: 02/06/2023  PCP:  Wanda PlumpPaz, Jose E, MD   [facility] Providers Cardiologist:  Charlton HawsPeter Nishan, MD     Patient Profile:   Steven MareWilliam A Foerster is a 82 y.o. male with a hx of CAD status post 3v CABG (LIMA to LAD, SVG to PDA, SVG to PLB), recent inferior STEMI treated medically, hypertension, hyperlipidemia, hypothyroidism, neuropathy, OSA who is being seen 02/06/2023 for the evaluation of hypertension at the request of Dr. Rush Landmarkegeler.  History of Present Illness:   Steven Munoz is an 82 year old male with past medical history noted above.  He has been followed by Dr. Eden EmmsNishan as an outpatient.  He underwent three-vessel CABG in 2012 with LIMA to LAD, SVG to PDA and SVG to PLB.   He was recently admitted 01/25/2023 with acute inferior STEMI.  He underwent cardiac catheterization with culprit lesion being a thrombotic occlusion of 90% in the anastomosis of SVG to PDA as well as thrombus in the PDA branch but with TIMI-3 flow.  He was treated with Aggrastat without PCI.  No recurrent chest pain.  Placed on DAPT with Plavix/aspirin with recommendations for at least 1 year.  Echocardiogram during that admission showed an LVEF of 40 to 45%, global hypokinesis, grade 1 diastolic dysfunction.  Placed on metoprolol 25 mg daily, losartan 25 mg daily, Farxiga and Lasix 20 mg daily as needed.  [facility] if his blood pressure was able to tolerate.  Continued on Crestor 40 mg daily.  He did developed a right groin hematoma post cath and was evaluated by [facility] with recommendations for conservative management.  Did undergo a repeat CT angio ao/thigh FEM which showed evolving hematoma in the right groin with no acute change.  [facility].  He presented to the ED on 4/11 with concerns over  elevated blood pressure.  Reports he has been progressing well since discharge.  States this morning he checked his blood pressure and noted his systolic BP in the 150s and on recheck in the 170s.  He also checked his heart rate and oxygen with his pulse oximetry and noted a variable heart rate between 40 and 70 bpm.  He was concerned and presented to the ED.  Labs in the ED showed sodium 137, potassium 3.9, creatinine 0.8, high-sensitivity troponin 37, WBC 7.4, hemoglobin 12.  Chest x-ray negative.  In talking with patient he would denies any symptoms prior to presenting to the ED, only concerned regarding his elevated blood pressure.  He reports being compliant with all of his medications since discharge.   Past Medical History:  Diagnosis Date   CAD (coronary artery disease) CABG  05/29/09    OPERATIVE PROCEDURES:  Median sternotomy, extracorporeal circulation,    Colonic polyp 2002, 2005, 2009   Dr Kinnie ScalesMedoff   Dermatophytosis of nail    Diverticulosis    Dysmetabolic syndrome    GERD (gastroesophageal reflux disease)    S/P dilation  2005   Gout    Hyperlipidemia    Hypertension    Hypothyroidism    Murmur    Nephrolithiasis    X 1   Peripheral neuropathy 03/20/2021   Pneumonia age 66   treated as inpatient   Sleep apnea    on CPAP; Dr Vassie LollAlva    Past  Surgical History:  Procedure Laterality Date   athroscopy     right knee X2, left knee X1   COLONOSCOPY  2002 & 2009 &2014   polypectomy; Dr Kinnie Scales   CORONARY ARTERY BYPASS GRAFT  2010   Median sternotomy, extracorporeal circulation, coronary bypass graft surgery x3 using a left internal mammary artery graft to left anterior descending coronary artery, with a sequential  saphenous vein graft to posterior descending and posterolateral branches  of the right coronary artery.  Endoscopic vein harvesting from the right   EYE SURGERY Right    tear duct   LEFT HEART CATH AND CORONARY ANGIOGRAPHY N/A 01/26/2023   Procedure: Coronary/Graft  Acute MI Revascularization;  Surgeon: Marykay Lex, MD;  Location[facility] INV[facility]IVE CV LAB;  Service: Cardiovascular;  Laterality: N/A;   Nail Alvusion Bilateral    Hallux   NOSE SURGERY  11/20/12   L max antrectomy;septoplasty;turbinate reduction. Dr Osvaldo Angst   REPLACEMENT TOTAL KNEE     right 2003   REPLACEMENT TOTAL KNEE     partial Left March 2009   TENDON RELEASE Right 03/2016   from R hip. @ [name]   TOTAL HIP ARTHROPLASTY  02/22/2014   right hip; Dr [facility]     Home Medications:  Prior to Admission medications   Medication Sig Start Date End Date Taking? Authorizing Provider  allopurinol (ZYLOPRIM) 100 MG tablet Take 1 tablet (100 mg total) by mouth daily. 11/04/22   Fuller Plan, MD  aspirin EC 81 MG tablet Take 81 mg by mouth daily.    [provider]  clopidogrel (PLAVIX) 75 MG tablet Take 1 tablet (75 mg total) by mouth daily with breakfast. 01/31/23   Laverda Page B, NP  Coenzyme Q10 (COQ10 PO) Take 1 capsule by mouth daily.    [provider]  colestipol (COLESTID) 1 g tablet Take 1 g by mouth daily. 06/12/22   [provider]  Cyanocobalamin (VITAMIN B-12 PO) Take 1 tablet by mouth daily.    [provider]  dapagliflozin propanediol (FARXIGA) 10 MG TABS tablet Take 1 tablet (10 mg total) by mouth daily. 01/31/23   Arty Baumgartner, NP  famotidine (PEPCID) 40 MG tablet Take 40 mg by mouth at bedtime.    [provider]  fluorouracil (EFUDEX) 5 % cream Apply 1 application  topically daily as needed (facial rash).    [provider]  furosemide (LASIX) 20 MG tablet Take 1 tablet (20 mg total) by mouth daily as needed. 01/30/23 01/30/24  Arty Baumgartner, NP  levothyroxine (SYNTHROID) 125 MCG tablet Take 1 tablet (125 mcg total) by mouth daily before breakfast. 07/11/22   Wanda Plump, MD  losartan (COZAAR) 25 MG tablet Take 1 tablet (25 mg total) by mouth daily. 01/31/23   Arty Baumgartner, NP  magnesium  oxide (MAG-OX) 400 (240 Mg) MG tablet Take 400 mg by mouth at bedtime.    [provider]  metoprolol succinate (TOPROL-XL) 25 MG 24 hr tablet Take 1 tablet (25 mg total) by mouth daily. 01/31/23   Arty Baumgartner, NP  Multiple Vitamins-Minerals (PRESERVISION AREDS 2+MULTI VIT) CAPS Take 1 capsule by mouth 2 (two) times daily.     [provider]  nitroGLYCERIN (NITROSTAT) 0.4 MG SL tablet Place 1 tablet (0.4 mg total) under the tongue every 5 (five) minutes as needed. 01/30/23   Arty Baumgartner, NP  Omega-3 Fatty Acids (FISH OIL PO) Take 1 capsule by mouth daily.  [provider]  potassium chloride (KLOR-CON) 10 MEQ tablet Take 1 tablet (10 mEq total) by mouth daily. Patient taking differently: Take 10 mEq by mouth at bedtime. 01/07/23   Wanda Plump, MD  rosuvastatin (CRESTOR) 40 MG tablet Take 1 tablet (40 mg total) by mouth daily. 01/31/23   Arty Baumgartner, NP    Inpatient Medications: Scheduled Meds:  Continuous Infusions:  PRN Meds:   Allergies:    Allergies  Allergen Reactions   Povidone-Iodine Rash   Ofloxacin Other (See Comments)    Affected sleep with eye drop formulation   Tape Rash and Other (See Comments)    Skin tears easily Cloth adhesive tape-per patient    Social History:   Social History   Socioeconomic History   Marital status: Married    Spouse name: Orlie Pollen   Number of children: 2   Years of education: Not on file   Highest education level: Some college, no degree  Occupational History   Occupation: retired- Theatre stage manager: [employer]  Tobacco Use   Smoking status: Former    Packs/day: 1.00    Years: 6.00    Additional pack years: 0.00    Total pack years: 6.00    Types: Cigarettes    Quit date: 10/28/1966    Years since quitting: 56.3   Smokeless tobacco: Never   Tobacco comments:    smoked 1962- 1968, up to < 1 ppd  Vaping Use   Vaping Use: Never used  Substance and Sexual Activity    Alcohol use: Yes    Alcohol/week: 3.0 standard drinks of alcohol    Types: 3 Glasses of wine per week    Comment: socially   Drug use: No   Sexual activity: Not on file  Other Topics Concern   Not on file  Social History Narrative   Socially; lives with spouse   Right Handed   Drinks no caffeine   Social Determinants of Health   Financial Resource Strain: Low Risk  (02/03/2023)   Overall Financial Resource Strain (CARDIA)    Difficulty of Paying Living Expenses: Not hard at all  Food Insecurity: No Food Insecurity (02/03/2023)   Hunger Vital Sign    Worried About Running Out of Food in the Last Year: Never true    Ran Out of Food in the Last Year: Never true  Transportation Needs: No Transportation Needs (02/03/2023)   PRAPARE - Administrator, Civil Service (Medical): No    Lack of Transportation (Non-Medical): No  Physical Activity: Sufficiently Active (02/03/2023)   Exercise Vital Sign    Days of Exercise per Week: 4 days    Minutes of Exercise per Session: 130 min  Stress: No Stress Concern Present (02/03/2023)   [employer] of Occupational Health - Occupational Stress Questionnaire    Feeling of Stress : Only a little  Social Connections: Socially Integrated (02/03/2023)   Social Connection and Isolation Panel [NHANES]    Frequency of Communication with Friends and Family: More than three times a week    Frequency of Social Gatherings with Friends and Family: Once a week    Attends Religious Services: More than 4 times per year    Active Member of [employer] or Organizations: Yes    Attends Banker Meetings: More than 4 times per year    Marital Status: Married  Catering manager Violence: Not At Risk (01/26/2023)   Humiliation, Afraid, Rape, and Kick questionnaire  Fear of Current or Ex-Partner: No    Emotionally Abused: No    Physically Abused: No    Sexually Abused: No    Family History:    Family History  Problem Relation Age of Onset    Hypertension Mother    Transient ischemic attack Mother    Lung cancer Mother        liver cancer, smoker, TIA   Liver cancer Mother    Heart attack Father 51   Heart attack Brother 88       smoker   Diabetes Neg Hx    Colon cancer Neg Hx    Prostate cancer Neg Hx      ROS:  Please see the history of present illness.   All other ROS reviewed and negative.     Physical Exam/Data:   Vitals:   02/06/23 1145 02/06/23 1200 02/06/23 1230 02/06/23 1300  BP: (!) 147/80 (!) 160/69 (!) 145/61 (!) 147/52  Pulse: 73 66 69 69  Resp: (!) 25 18 (!) 22 17  Temp:      TempSrc:      SpO2: 100% 100% 100% 99%  Weight:      Height:       No intake or output data in the 24 hours ending 02/06/23 1340    02/06/2023    9:40 AM 02/04/2023   11:31 AM 01/30/2023    4:24 AM  Last 3 Weights  Weight (lbs) 285 lb 0.9 oz 285 lb 295 lb 10.2 oz  Weight (kg) 129.3 kg 129.275 kg 134.1 kg     Body mass index is 37.61 kg/m.  General:  Well nourished, well developed, in no acute distress HEENT: normal Neck: no JVD Vascular: No carotid bruits; Distal pulses 2+ bilaterally Cardiac:  normal S1, S2; RRR; no murmur  Lungs:  clear to auscultation bilaterally, no wheezing, rhonchi or rales  Abd: soft, nontender, no hepatomegaly  Ext: no edema Musculoskeletal:  No deformities, BUE and BLE strength normal and equal. Right groin with evolving ecchymosis, stable hematoma Skin: warm and dry  Neuro:  CNs 2-12 intact, no focal abnormalities noted Psych:  Normal affect   EKG:  The EKG was personally reviewed and demonstrates: Sinus rhythm, 74 bpm, frequent PVCs Telemetry:  Telemetry was personally reviewed and demonstrates: Sinus rhythm, frequent PACs  Relevant CV Studies:  Cath: 01/26/2023    Mid LM to Ost LAD lesion is 20% stenosed with 20% stenosed side branch in Ost Cx.   Ost LAD to Prox LAD lesion is 75% stenosed.  Prox LAD to Mid LAD lesion is 95% stenosed with 99% stenosed side branch in 1st Diag. Mid LAD  lesion is 100% stenosed.  2nd Diag lesion is 80% stenosed.   Prox RCA lesion is 60% stenosed.  Mid RCA to Dist RCA lesion is 100% stenosed.   LIMA-LAD graft was visualized by angiography and is normal in caliber and moderate in size.  The graft exhibits no disease.   SVG-dRCA (PDA-PAV bifurcation) graft was visualized by angiography and is large.  The graft exhibits minimal luminal irregularities.   CULPRIT LESION: RPDA lesion is 60% stenosed. ->  Thrombotic stenosis, unable to really determine initial lesion and vessel size.   There is mild left ventricular systolic dysfunction.  The left ventricular ejection fraction is 45-50% by visual estimate.  LV end diastolic pressure is normal.   There is no aortic valve stenosis.   =================================================================== POST-OPERATIVE DIAGNOSIS:   Culprit lesion is thrombotic 90% occlusion in the  anastomosis of the SVG-PDA (the graft actually anastomosis at the bifurcation of the PL and PDA and the distal RCA.  There is thrombus in the PDA branch but not in the PL branch with TIMI-3 flow => very difficult high graft to access.  Plan was to treat with Aggrastat and reevaluate early next week. Native RCA has extensive 60% proximal to mid stenosis and is occluded in the distal vessel. Native the left main has distal 20% stenosis branches into the LAD and LCx.  LCx courses as a large caliber bifurcating OM branch with a AV groove branch that is free of disease.  The ostial LAD has 80% stenosis then has severe 90+ percent stenosis and a small diagonal just after SP1 it is then 100% occluded shortly after this branch. LIMA to LAD is widely patent with antegrade flow down to the apex and retrograde flow back up to a small diagonal branch with trivial flow. Normal LVEDP of 12 mmHg.  Mild inferior hypokinesis but very poor visualization.  Recommend 2D echocardiogram.  Otherwise EF appears to be maybe 45 to 50%.     PLAN OF CARE: Admit to  inpatient  -> run Aggrastat to discontinue at 0830 hrs., pull sheath 2 hours later.=> Reassess EKG and symptoms, would probably consider relook catheterization on Monday to reassess the anastomotic lesion. Check 2D echo Increase statin to 40 mg rosuvastatin, increase beta-blocker to metoprolol titrate 12.5 mg twice daily  Diagnostic Dominance: Right   Echo: 01/26/2023  IMPRESSIONS     1. Left ventricular ejection fraction, by estimation, is 40 to 45%. The  left ventricle has mildly decreased function. The left ventricle  demonstrates global hypokinesis. Left ventricular diastolic parameters are  consistent with Grade I diastolic  dysfunction (impaired relaxation).   2. Right ventricular systolic function is normal. The right ventricular  size is normal.   3. Left atrial size was moderately dilated.   4. The mitral valve is normal in structure. Trivial mitral valve  regurgitation. No evidence of mitral stenosis.   5. The aortic valve was not well visualized. Aortic valve regurgitation  is not visualized. No aortic stenosis is present.   Comparison(s): Changes from prior study are noted. LVEF worsened from  60-65% in 05/2022 to 40-45% now. Mild AS and mild MS noted on 05/2022 are  not evident anymore.   FINDINGS   Left Ventricle: Left ventricular ejection fraction, by estimation, is 40  to 45%. The left ventricle has mildly decreased function. The left  ventricle demonstrates global hypokinesis. The left ventricular internal  cavity size was normal in size. There is   no left ventricular hypertrophy. Left ventricular diastolic parameters  are consistent with Grade I diastolic dysfunction (impaired relaxation).   Right Ventricle: The right ventricular size is normal. No increase in  right ventricular wall thickness. Right ventricular systolic function is  normal.   Left Atrium: Left atrial size was moderately dilated.   Right Atrium: Right atrial size was normal in size.    Pericardium: There is no evidence of pericardial effusion.   Mitral Valve: The mitral valve is normal in structure. Trivial mitral  valve regurgitation. No evidence of mitral valve stenosis.   Tricuspid Valve: The tricuspid valve is normal in structure. Tricuspid  valve regurgitation is trivial. No evidence of tricuspid stenosis.   Aortic Valve: The aortic valve was not well visualized. Aortic valve  regurgitation is not visualized. No aortic stenosis is present.   Pulmonic Valve: The pulmonic valve was normal in  structure. Pulmonic valve  regurgitation is trivial. No evidence of pulmonic stenosis.   Aorta: The aortic root and ascending aorta are structurally normal, with  no evidence of dilitation.   Venous: The inferior vena cava was not well visualized.   IAS/Shunts: The interatrial septum was not well visualized.   Laboratory Data:  High Sensitivity Troponin:   Recent Labs  Lab 01/25/23 2320 01/26/23 0328 02/06/23 0944  TROPONINIHS 561* >24,000* 37*     Chemistry Recent Labs  Lab 02/04/23 1242 02/06/23 0944  NA 138 137  K 4.2 3.9  CL 104 106  CO2 25 22  GLUCOSE 96 104*  BUN 19 14  CREATININE 0.85 0.87  CALCIUM 9.1 9.0  GFRNONAA  --  >60  ANIONGAP  --  9    No results for input(s): "PROT", "ALBUMIN", "AST", "ALT", "ALKPHOS", "BILITOT" in the last 168 hours. Lipids No results for input(s): "CHOL", "TRIG", "HDL", "LABVLDL", "LDLCALC", "CHOLHDL" in the last 168 hours.  Hematology Recent Labs  Lab 02/04/23 1242 02/06/23 0944  WBC 7.9 7.4  RBC 3.61* 3.68*  HGB 11.9* 12.0*  HCT 34.6* 35.4*  MCV 95.8 96.2  MCH  --  32.6  MCHC 34.5 33.9  RDW 14.1 14.1  PLT 241.0 248   Thyroid No results for input(s): "TSH", "FREET4" in the last 168 hours.  BNPNo results for input(s): "BNP", "PROBNP" in the last 168 hours.  DDimer No results for input(s): "DDIMER" in the last 168 hours.   Radiology/Studies:  DG Chest 2 View  Result Date: 02/06/2023 CLINICAL DATA:   Elevated blood pressure EXAM: CHEST - 2 VIEW COMPARISON:  Chest radiograph dated 01/25/2023 FINDINGS: Unchanged elevation of the right hemidiaphragm. Normal lung volumes. No focal consolidations. No pleural effusion or pneumothorax. The heart size and mediastinal contours are within normal limits. Median sternotomy wires are nondisplaced. Healing left posterior eighth rib fracture. Partially imaged cholelithiasis. IMPRESSION: No active cardiopulmonary disease. Electronically Signed   By: Agustin Cree M.D.   On: 02/06/2023 10:26     Assessment and Plan:   Steven Munoz is a 82 y.o. male with a hx of CAD status post 3v CABG (LIMA to LAD, SVG to PDA, SVG to PLB), recent inferior STEMI treated medically, hypertension, hyperlipidemia, hypothyroidism, neuropathy, OSA who is being seen 02/06/2023 for the evaluation of hypertension at the request of Dr. Rush Landmark.  Hypertension -- Initially discharged on metoprolol 25 mg daily as well as losartan 25 mg daily.  Plan was to transition to [facility] as an outpatient pending stable blood pressures.  Given his BPs now in the 150-170 range systolic we will transition to Entresto 24-26 mg twice daily -- continue Toprol XL 25mg  daily, Entresto 24-26mg  BID  CAD s/p 3v CABG (LIMA to LAD, SVG to PDA, SVG to PLB) Recent Inferior STEMI -- No anginal symptoms, progressing well at home -- Continue aspirin, Plavix, statin, beta-blocker  HLD -- Continue Crestor 40 mg daily  HFrEF ICM -- LVEF of 40 to 45%, grade 1 diastolic dysfunction, normal RV size and function during recent admission 3/31 -- GDMT: Continue Toprol XL 25 mg daily, as above transition to [employer] 24-26 mg twice daily, Farxiga, Lasix 20 mg daily.  Keep scheduled follow up in the [facility] HF clinic on 4/18  For questions or updates, please contact [facility] Please consult [email or web address] for contact info under    Signed, Laverda Page, NP  02/06/2023 1:40 PM

## 2023-02-06 NOTE — ED Provider Notes (Signed)
EMERGENCY DEPARTMENT AT [facility] Provider Note   CSN: [id number] Arrival date & time: 02/06/23  0932     History  Chief Complaint  Patient presents with   Hypertension    Steven Munoz is a 82 y.o. male.  The history is provided by the patient and medical records. No language interpreter was used.  Hypertension This is a recurrent problem. The problem occurs constantly. The problem has not changed since onset.Pertinent negatives include no chest pain, no abdominal pain, no headaches and no shortness of breath. Nothing aggravates the symptoms. Nothing relieves the symptoms. He has tried nothing for the symptoms. The treatment provided no relief.       Home Medications Prior to Admission medications   Medication Sig Start Date End Date Taking? Authorizing Provider  allopurinol (ZYLOPRIM) 100 MG tablet Take 1 tablet (100 mg total) by mouth daily. 11/04/22   Fuller Plan, MD  aspirin EC 81 MG tablet Take 81 mg by mouth daily.    [provider]  clopidogrel (PLAVIX) 75 MG tablet Take 1 tablet (75 mg total) by mouth daily with breakfast. 01/31/23   Laverda Page B, NP  Coenzyme Q10 (COQ10 PO) Take 1 capsule by mouth daily.    [provider]  colestipol (COLESTID) 1 g tablet Take 1 g by mouth daily. 06/12/22   [provider]  Cyanocobalamin (VITAMIN B-12 PO) Take 1 tablet by mouth daily.    [provider]  dapagliflozin propanediol (FARXIGA) 10 MG TABS tablet Take 1 tablet (10 mg total) by mouth daily. 01/31/23   Arty Baumgartner, NP  famotidine (PEPCID) 40 MG tablet Take 40 mg by mouth at bedtime.    [provider]  fluorouracil (EFUDEX) 5 % cream Apply 1 application  topically daily as needed (facial rash).    [provider]  furosemide (LASIX) 20 MG tablet Take 1 tablet (20 mg total) by mouth daily as needed. 01/30/23 01/30/24  Arty Baumgartner, NP  levothyroxine (SYNTHROID) 125 MCG tablet Take  1 tablet (125 mcg total) by mouth daily before breakfast. 07/11/22   Wanda Plump, MD  losartan (COZAAR) 25 MG tablet Take 1 tablet (25 mg total) by mouth daily. 01/31/23   Arty Baumgartner, NP  magnesium oxide (MAG-OX) 400 (240 Mg) MG tablet Take 400 mg by mouth at bedtime.    [provider]  metoprolol succinate (TOPROL-XL) 25 MG 24 hr tablet Take 1 tablet (25 mg total) by mouth daily. 01/31/23   Arty Baumgartner, NP  Multiple Vitamins-Minerals (PRESERVISION AREDS 2+MULTI VIT) CAPS Take 1 capsule by mouth 2 (two) times daily.     [provider]  nitroGLYCERIN (NITROSTAT) 0.4 MG SL tablet Place 1 tablet (0.4 mg total) under the tongue every 5 (five) minutes as needed. 01/30/23   Arty Baumgartner, NP  Omega-3 Fatty Acids (FISH OIL PO) Take 1 capsule by mouth daily.    [provider]  potassium chloride (KLOR-CON) 10 MEQ tablet Take 1 tablet (10 mEq total) by mouth daily. Patient taking differently: Take 10 mEq by mouth at bedtime. 01/07/23   Wanda Plump, MD  rosuvastatin (CRESTOR) 40 MG tablet Take 1 tablet (40 mg total) by mouth daily. 01/31/23   Arty Baumgartner, NP      Allergies    Povidone-iodine, Ofloxacin, and Tape    Review of Systems   Review of Systems  Constitutional:  Negative for chills, fatigue and fever.  HENT:  Negative for congestion.   Respiratory:  Negative for cough, chest tightness, shortness of breath and wheezing.   Cardiovascular:  Negative for chest pain and palpitations.  Gastrointestinal:  Negative for abdominal pain, constipation, diarrhea, nausea and vomiting.  Genitourinary:  Negative for flank pain.  Musculoskeletal:  Negative for back pain, neck pain and neck stiffness.  Skin:  Negative for rash and wound.  Neurological:  Negative for weakness, light-headedness and headaches.  Psychiatric/Behavioral:  Negative for agitation and confusion.   All other systems reviewed and are negative.   Physical Exam Updated Vital Signs BP (!)  160/69   Pulse 66   Temp 98.2 F (36.8 C) (Oral)   Resp 18   Ht 6\' 1"  (1.854 m)   Wt 129.3 kg   SpO2 100%   BMI 37.61 kg/m  Physical Exam Vitals and nursing note reviewed.  Constitutional:      General: He is not in acute distress.    Appearance: He is well-developed. He is not ill-appearing, toxic-appearing or diaphoretic.  HENT:     Head: Normocephalic and atraumatic.     Nose: No congestion or rhinorrhea.     Mouth/Throat:     Mouth: Mucous membranes are moist.     Pharynx: No oropharyngeal exudate or posterior oropharyngeal erythema.  Eyes:     Extraocular Movements: Extraocular movements intact.     Conjunctiva/sclera: Conjunctivae normal.     Pupils: Pupils are equal, round, and reactive to light.  Cardiovascular:     Rate and Rhythm: Normal rate and regular rhythm.     Heart sounds: No murmur heard. Pulmonary:     Effort: Pulmonary effort is normal. No respiratory distress.     Breath sounds: Normal breath sounds. No wheezing, rhonchi or rales.  Chest:     Chest wall: No tenderness.  Abdominal:     General: Abdomen is flat.     Palpations: Abdomen is soft.     Tenderness: There is no abdominal tenderness. There is no right CVA tenderness, left CVA tenderness, guarding or rebound.  Musculoskeletal:     Cervical back: Neck supple.     Right lower leg: No edema.     Left lower leg: No edema.  Skin:    General: Skin is warm and dry.     Capillary Refill: Capillary refill takes less than 2 seconds.     Findings: No erythema or rash.  Neurological:     General: No focal deficit present.     Mental Status: He is alert.     Sensory: No sensory deficit.     Motor: No weakness.  Psychiatric:        Mood and Affect: Mood normal.     ED Results / Procedures / Treatments   Labs (all labs ordered are listed, but only abnormal results are displayed) Labs Reviewed  BASIC METABOLIC PANEL - Abnormal; Notable for the following components:      Result Value   Glucose,  Bld 104 (*)    All other components within normal limits  CBC - Abnormal; Notable for the following components:   RBC 3.68 (*)    Hemoglobin 12.0 (*)    HCT 35.4 (*)    All other components within normal limits  TROPONIN I (HIGH SENSITIVITY) - Abnormal; Notable for the following components:   Troponin I (High Sensitivity) 37 (*)    All other components within normal limits  TROPONIN I (HIGH SENSITIVITY) - Abnormal; Notable for the following components:  Troponin I (High Sensitivity) 34 (*)    All other components within normal limits    EKG None  Radiology DG Chest 2 View  Result Date: 02/06/2023 CLINICAL DATA:  Elevated blood pressure EXAM: CHEST - 2 VIEW COMPARISON:  Chest radiograph dated 01/25/2023 FINDINGS: Unchanged elevation of the right hemidiaphragm. Normal lung volumes. No focal consolidations. No pleural effusion or pneumothorax. The heart size and mediastinal contours are within normal limits. Median sternotomy wires are nondisplaced. Healing left posterior eighth rib fracture. Partially imaged cholelithiasis. IMPRESSION: No active cardiopulmonary disease. Electronically Signed   By: Agustin Cree M.D.   On: 02/06/2023 10:26    Procedures Procedures    Medications Ordered in ED Medications - No data to display  ED Course/ Medical Decision Making/ A&P                             Medical Decision Making Amount and/or Complexity of Data Reviewed Labs: ordered. Radiology: ordered.    Steven Munoz is a 82 y.o. male with a past medical history significant for CAD with previous CABG and recent STEMI with PCI 2 weeks ago, hyperlipidemia, gout, hypertension, peripheral neuropathies, previous atrial fibrillation, and heart failure who presents with elevated blood pressure and slow heart rate.  According to patient, he had a STEMI not quite 2 weeks ago and was in the hospital.  He reports since then he had done well and has been diuresing and blood pressures have been  well-controlled but he reports today he took his blood pressure and it was elevating.  Patient reports blood pressures up into the 170s systolic and his heart rate was staying in the 30s intermittently.  He otherwise is denying any symptoms but occasionally has palpitations chronically.  He denies any fevers, chills, congestion, cough.  Denies any nausea, vomiting, diaphoresis.  Denies any leg pain or leg swelling worse than his baseline.  Thought he was doing well but the blood pressure made him concerned.   On exam, lungs clear.  Chest nontender.  I do hear a murmur.  I do not appreciate a carotid bruit.  Good pulses in extremities.  Leg slightly edematous but he reports it is actually improving compared to prior.  Patient otherwise well-appearing.  Blood pressures in the 160s on my evaluation.  He does appear to have a trigeminy type pattern on EKG with no STEMI.  Clinically I suspect that the low heart rate on his home monitor was actually a trigeminy or bigeminy pattern that was causing it to be low.  Overall patient was asymptomatic however with his blood pressure rising and heart rate decreasing and his recent cardiac management, we will get labs and speak to cardiology.  Given the slow heart rates, it is unclear if they want to change beta-blocker or use other medications to help his pressure.  Anticipate discussion with cardiology.   Cardiology saw patient and wanted to make some medication changes.  His troponin was downtrending.  Patient asymptomatic and feeling well.  Cardiology feels he is safe for discharge home with a medication change and outpatient follow-up.  Patient given medications by transition of care to pharmacy and patient be discharged for outpatient follow-up.         Final Clinical Impression(s) / ED Diagnoses Final diagnoses:  Hypertension, unspecified type     Clinical Impression: 1. Hypertension, unspecified type     Disposition: Discharge  Condition:  Good  I have discussed  the results, Dx and Tx plan with the pt(& family if present). He/she/they expressed understanding and agree(s) with the plan. Discharge instructions discussed at great length. Strict return precautions discussed and pt &/or family have verbalized understanding of the instructions. No further questions at time of discharge.    Current Discharge Medication List     START taking these medications   Details  hydrALAZINE (APRESOLINE) 10 MG tablet Take 1 tablet (10 mg total) by mouth 2 (two) times daily as needed. Take one tablet as needed for systolic BP >150 Qty: 60 tablet, Refills: 0    sacubitril-valsartan (ENTRESTO) 24-26 MG Take 1 tablet by mouth 2 (two) times daily. Qty: 60 tablet, Refills: 2        Follow Up: No follow-up provider specified.     Emmett Bracknell, Canary Brimhristopher J, MD 02/06/23 1524

## 2023-02-06 NOTE — Discharge Instructions (Signed)
Your workup today was overall reassuring and your blood pressure was elevated but downtrending.  The cardiology team made to medication changes with please continue those medicines as they recommended.  Please follow-up with your cardiology and PCP teams.  If any symptoms change or worsen acutely, please turn to the nearest emergency department.

## 2023-02-06 NOTE — ED Triage Notes (Signed)
Pt states bp was elevated at 170/84 this morning. Pt denies dizziness and weakness.

## 2023-02-07 ENCOUNTER — Telehealth: Payer: Self-pay | Admitting: Internal Medicine

## 2023-02-07 NOTE — Telephone Encounter (Signed)
Pt called with a few questions regarding his medications for Tammy. Advised pt that Tammy was unavailable and that an appt would need to be scheduled . Pt stated he is available on 4.15-4.16 at any time.

## 2023-02-10 ENCOUNTER — Other Ambulatory Visit (HOSPITAL_COMMUNITY): Payer: Self-pay

## 2023-02-10 NOTE — Progress Notes (Addendum)
Called patient and discussed recent medication changes. See below for changes per recent hospitalization and ED visit.  Reviewed meds and patient has made recommended changes.   Patient was concerned about cost of [place].  Screened for medication assistance program but patient did not qualify. Also screened for [facility] but also did not qualify.  Verified transitions of care pharmacy used 30 day free coupon for Farxiga when dispensed 01/30/2023. Patient cost should be $47/30 day for each Sherryll Burger and Marcelline Deist until he reaches coverage gap and then cost would be around $150 /30 days for each.   Patient reports he has lost 10lbs since being back home from hospital. He states he has not had to take furosemide for fluid retention.  He is checking weight daily.    Continue current medications.  Restart exercise as recommended by cardiologist.     01/25/2023 to 01/30/2023 - Hospitalization for STEMI Reviewed discharge records Medications STARTED at discharge: clopidogrel (PLAVIX) 75mg  daily dapagliflozin propanediol (FARXIGA) 10mg  daily furosemide (Lasix) 20mg  daily as needed metoprolol succinate (TOPROL-XL) 25mg  daily  nitroGLYCERIN (Nitrostat) 0.4mg  1 tablet SL as needed for chest pain Medications CHANGED at discharge: losartan (COZAAR) rosuvastatin (CRESTOR) Medication STOPPed at discharge: hydrochlorothiazide 25 MG tablet (HYDRODIURIL) ibuprofen 200 MG tablet (ADVIL) metoprolol tartrate 25 MG tablet (LOPRESSOR)   ED visit for hypertension 02/06/2023 Stopped losartan Medications Started: Entresto 24/26mg  twice a day Hydralazine 10mg  2 times a day if systolic blood pressure > 150.

## 2023-02-13 ENCOUNTER — Encounter (HOSPITAL_COMMUNITY): Payer: Self-pay

## 2023-02-13 ENCOUNTER — Ambulatory Visit (HOSPITAL_COMMUNITY)
Admit: 2023-02-13 | Discharge: 2023-02-13 | Disposition: A | Discharge status: Home / Self Care | Payer: Medicare HMO | Attending: Physician Assistant | Admitting: Physician Assistant

## 2023-02-13 VITALS — BP 138/72 | HR 80 | Wt 281.1 lb

## 2023-02-13 DIAGNOSIS — E039 Hypothyroidism, unspecified: Secondary | ICD-10-CM | POA: Insufficient documentation

## 2023-02-13 DIAGNOSIS — I5022 Chronic systolic (congestive) heart failure: Secondary | ICD-10-CM | POA: Diagnosis not present

## 2023-02-13 DIAGNOSIS — E782 Mixed hyperlipidemia: Secondary | ICD-10-CM

## 2023-02-13 DIAGNOSIS — Z951 Presence of aortocoronary bypass graft: Secondary | ICD-10-CM | POA: Diagnosis not present

## 2023-02-13 DIAGNOSIS — E785 Hyperlipidemia, unspecified: Secondary | ICD-10-CM | POA: Diagnosis not present

## 2023-02-13 DIAGNOSIS — I493 Ventricular premature depolarization: Secondary | ICD-10-CM | POA: Insufficient documentation

## 2023-02-13 DIAGNOSIS — Z7982 Long term (current) use of aspirin: Secondary | ICD-10-CM | POA: Diagnosis not present

## 2023-02-13 DIAGNOSIS — E669 Obesity, unspecified: Secondary | ICD-10-CM | POA: Insufficient documentation

## 2023-02-13 DIAGNOSIS — E079 Disorder of thyroid, unspecified: Secondary | ICD-10-CM | POA: Diagnosis not present

## 2023-02-13 DIAGNOSIS — I25119 Atherosclerotic heart disease of native coronary artery with unspecified angina pectoris: Secondary | ICD-10-CM | POA: Diagnosis not present

## 2023-02-13 DIAGNOSIS — I10 Essential (primary) hypertension: Secondary | ICD-10-CM | POA: Diagnosis not present

## 2023-02-13 DIAGNOSIS — I252 Old myocardial infarction: Secondary | ICD-10-CM | POA: Diagnosis not present

## 2023-02-13 DIAGNOSIS — Z7902 Long term (current) use of antithrombotics/antiplatelets: Secondary | ICD-10-CM | POA: Insufficient documentation

## 2023-02-13 DIAGNOSIS — I2510 Atherosclerotic heart disease of native coronary artery without angina pectoris: Secondary | ICD-10-CM | POA: Insufficient documentation

## 2023-02-13 DIAGNOSIS — G4733 Obstructive sleep apnea (adult) (pediatric): Secondary | ICD-10-CM | POA: Diagnosis not present

## 2023-02-13 DIAGNOSIS — G629 Polyneuropathy, unspecified: Secondary | ICD-10-CM | POA: Diagnosis not present

## 2023-02-13 DIAGNOSIS — I110 Hypertensive heart disease with heart failure: Secondary | ICD-10-CM | POA: Insufficient documentation

## 2023-02-13 DIAGNOSIS — Z79899 Other long term (current) drug therapy: Secondary | ICD-10-CM | POA: Insufficient documentation

## 2023-02-13 LAB — BASIC METABOLIC PANEL
Anion gap: 9 (ref 5–15)
BUN: 17 mg/dL (ref 8–23)
CO2: 25 mmol/L (ref 22–32)
Calcium: 9.3 mg/dL (ref 8.9–10.3)
Chloride: 105 mmol/L (ref 98–111)
Creatinine, Ser: 0.9 mg/dL (ref 0.61–1.24)
GFR, Estimated: >60 mL/min (ref >60)
Glucose, Bld: 94 mg/dL (ref 70–99)
Potassium: 4 mmol/L (ref 3.5–5.1)
Sodium: 139 mmol/L (ref 135–145)

## 2023-02-13 LAB — BRAIN NATRIURETIC PEPTIDE: B Natriuretic Peptide: 281.2 pg/mL — ABNORMAL HIGH (ref 0.0–100.0)

## 2023-02-13 MED ORDER — SPIRONOLACTONE 25 MG PO TABS: 12.5000 mg | ORAL_TABLET | Freq: Every day | ORAL | 3 refills | Status: DC

## 2023-02-13 NOTE — Patient Instructions (Signed)
STOP Potassium  START Spironolactone 12.5 mg ( 1/2 Tab ) daily.  Thank you for allowing [place] to provider your heart failure care after your recent hospitalization. Please follow-up with Dr. Eden Emms as scheduled.

## 2023-02-13 NOTE — Addendum Note (Signed)
Encounter addended by: Andrey Farmer, PA-C on: 02/13/2023 5:16 PM  Actions taken: Clinical Note Signed

## 2023-02-13 NOTE — Progress Notes (Addendum)
HEART & VASCULAR TRANSITION OF CARE CONSULT NOTE     Referring Physician:  Primary Care: Primary Cardiologist:  HPI: Referred to clinic by Dr. Herbie Baltimore for heart failure consultation. 82 y.o. male with history of CAD s/p CABG in 2012 (LIMA to LAD, sequential SVG to PDA and PLB), HTN, HLD, OSA on CPAP, hypothyroidism, peripheral neuropathy, obesity. Presented 01/25/23 with inferior ST elevation MI. Culprit lesion thrombotic occlusion of 90% anastomosis of SVG to PDA treated with aggrastat without PCI.  Developed R groin hematoma post cath briefly requiring pressor support and 1 u RBCs. Echo with EF 40-45%, RV okay.  He was later seen in ED on 04/11 with elevated blood pressure and slow heart rate (HR variable 40s-70s at home). He was seen by cardiology. Losartan switched to Entresto. He was continued on beta blocker. ECG demonstrated sinus rhythm with PVCs.  He is here today for hospital follow-up. Has been doing well from HF standpoint. No chest pain, dyspnea, orthopnea, PND or lower extremity edema Has lost 10 lbs on home scale since discharge on 04/04. BP has been averaging 120s-130s/60s since his ED visit.  Taking all medications as prescribed.  Has been doing some walking in the house. Mobility limited by some swelling and discomfort around groin from hematoma, but this is improving. Plans to participate in cardiac rehab. Watching sodium and fluid intake diligently.   Review of Systems: [y] = yes,  = no   General: Weight gain ; Weight loss [Y]; Anorexia ; Fatigue ; Fever ; Chills ; Weakness   Cardiac: Chest pain/pressure ; Resting SOB ; Exertional SOB ; Orthopnea ; Pedal Edema ; Palpitations ; Syncope ; Presyncope ; Paroxysmal nocturnal dyspnea[ ]   Pulmonary: Cough ; Wheezing[ ] ; Hemoptysis[ ] ; Sputum ; Snoring   GI: Vomiting[ ] ; Dysphagia[ ] ; Melena[ ] ; Hematochezia ; Heartburn[ ] ; Abdominal pain ; Constipation ; Diarrhea [  ]; BRBPR   GU: Hematuria[ ] ; Dysuria ; Nocturia[ ]   Vascular: Pain in legs with walking ; Pain in feet with lying flat ; Non-healing sores ; Stroke ; TIA ; Slurred speech ;  Neuro: Headaches[ ] ; Vertigo[ ] ; Seizures[ ] ; Paresthesias[ ] ;Blurred vision ; Diplopia ; Vision changes   Ortho/Skin: Arthritis ; Joint pain ; Muscle pain ; Joint swelling ; Back Pain ; Rash   Psych: Depression[ ] ; Anxiety[ ]   Heme: Bleeding problems ; Clotting disorders ; Anemia   Endocrine: Diabetes ; Thyroid dysfunction[Y]   Past Medical History:  Diagnosis Date   CAD (coronary artery disease) CABG  05/29/09    OPERATIVE PROCEDURES:  Median sternotomy, extracorporeal circulation,    Colonic polyp 2002, 2005, 2009   Dr Kinnie Scales   Dermatophytosis of nail    Diverticulosis    Dysmetabolic syndrome    GERD (gastroesophageal reflux disease)    S/P dilation  2005   Gout    Hyperlipidemia    Hypertension    Hypothyroidism    Murmur    Nephrolithiasis    X 1   Peripheral neuropathy 03/20/2021   Pneumonia age 58   treated as inpatient   Sleep apnea    on CPAP; Dr Vassie Loll    Current Outpatient Medications  Medication Sig Dispense Refill   allopurinol (ZYLOPRIM) 100 MG tablet Take 1  tablet (100 mg total) by mouth daily. 90 tablet 1   aspirin EC 81 MG tablet Take 81 mg by mouth daily.     clopidogrel (PLAVIX) 75 MG tablet Take 1 tablet (75 mg total) by mouth daily with breakfast. 90 tablet 2   Coenzyme Q10 (COQ10 PO) Take 1 capsule by mouth daily.     colestipol (COLESTID) 1 g tablet Take 1 g by mouth daily.     Cyanocobalamin (VITAMIN B-12 PO) Take 1 tablet by mouth daily.     dapagliflozin propanediol (FARXIGA) 10 MG TABS tablet Take 1 tablet (10 mg total) by mouth daily. 90 tablet 0   famotidine (PEPCID) 40 MG tablet Take 40 mg by mouth at bedtime.     fluorouracil (EFUDEX) 5 % cream Apply 1 application  topically daily as needed (facial rash).      furosemide (LASIX) 20 MG tablet Take 1 tablet (20 mg total) by mouth daily as needed. 30 tablet 1   hydrALAZINE (APRESOLINE) 10 MG tablet Take 1 tablet (10 mg total) by mouth 2 (two) times daily as needed. Take one tablet as needed for systolic BP >150 60 tablet 0   levothyroxine (SYNTHROID) 125 MCG tablet Take 1 tablet (125 mcg total) by mouth daily before breakfast. 90 tablet 1   magnesium oxide (MAG-OX) 400 (240 Mg) MG tablet Take 400 mg by mouth at bedtime.     metoprolol succinate (TOPROL-XL) 25 MG 24 hr tablet Take 1 tablet (25 mg total) by mouth daily. 90 tablet 1   Multiple Vitamins-Minerals (PRESERVISION AREDS 2+MULTI VIT) CAPS Take 1 capsule by mouth 2 (two) times daily.      nitroGLYCERIN (NITROSTAT) 0.4 MG SL tablet Place 1 tablet (0.4 mg total) under the tongue every 5 (five) minutes as needed. 25 tablet 2   Omega-3 Fatty Acids (FISH OIL PO) Take 1 capsule by mouth daily.     rosuvastatin (CRESTOR) 40 MG tablet Take 1 tablet (40 mg total) by mouth daily. 90 tablet 1   sacubitril-valsartan (ENTRESTO) 24-26 MG Take 1 tablet by mouth 2 (two) times daily. 60 tablet 2   spironolactone (ALDACTONE) 25 MG tablet Take 0.5 tablets (12.5 mg total) by mouth daily. 45 tablet 3   No current facility-administered medications for this encounter.    Allergies  Allergen Reactions   Povidone-Iodine Rash   Ofloxacin Other (See Comments)    Affected sleep with eye drop formulation   Tape Rash and Other (See Comments)    Skin tears easily Cloth adhesive tape-per patient      Social History   Socioeconomic History   Marital status: Married    Spouse name: Orlie Pollen   Number of children: 2   Years of education: Not on file   Highest education level: Some college, no degree  Occupational History   Occupation: retired- Theatre stage manager: [employer]  Tobacco Use   Smoking status: Former    Packs/day: 1.00    Years: 6.00    Additional pack years: 0.00    Total pack years:  6.00    Types: Cigarettes    Quit date: 10/28/1966    Years since quitting: 56.3   Smokeless tobacco: Never   Tobacco comments:    smoked 1962- 1968, up to < 1 ppd  Vaping Use   Vaping Use: Never used  Substance and Sexual Activity   Alcohol use: Yes    Alcohol/week: 3.0 standard drinks of alcohol    Types:  3 Glasses of wine per week    Comment: socially   Drug use: No   Sexual activity: Not on file  Other Topics Concern   Not on file  Social History Narrative   Socially; lives with spouse   Right Handed   Drinks no caffeine   Social Determinants of Health   Financial Resource Strain: Low Risk  (02/03/2023)   Overall Financial Resource Strain (CARDIA)    Difficulty of Paying Living Expenses: Not hard at all  Food Insecurity: No Food Insecurity (02/03/2023)   Hunger Vital Sign    Worried About Running Out of Food in the Last Year: Never true    Ran Out of Food in the Last Year: Never true  Transportation Needs: No Transportation Needs (02/03/2023)   PRAPARE - Administrator, Civil Service (Medical): No    Lack of Transportation (Non-Medical): No  Physical Activity: Sufficiently Active (02/03/2023)   Exercise Vital Sign    Days of Exercise per Week: 4 days    Minutes of Exercise per Session: 130 min  Stress: No Stress Concern Present (02/03/2023)   [employer] of Occupational Health - Occupational Stress Questionnaire    Feeling of Stress : Only a little  Social Connections: Socially Integrated (02/03/2023)   Social Connection and Isolation Panel [NHANES]    Frequency of Communication with Friends and Family: More than three times a week    Frequency of Social Gatherings with Friends and Family: Once a week    Attends Religious Services: More than 4 times per year    Active Member of [employer] or Organizations: Yes    Attends Engineer, structural: More than 4 times per year    Marital Status: Married  Catering manager Violence: Not At Risk (01/26/2023)    Humiliation, Afraid, Rape, and Kick questionnaire    Fear of Current or Ex-Partner: No    Emotionally Abused: No    Physically Abused: No    Sexually Abused: No      Family History  Problem Relation Age of Onset   Hypertension Mother    Transient ischemic attack Mother    Lung cancer Mother        liver cancer, smoker, TIA   Liver cancer Mother    Heart attack Father 4   Heart attack Brother 74       smoker   Diabetes Neg Hx    Colon cancer Neg Hx    Prostate cancer Neg Hx     Vitals:   02/13/23 1344  BP: 138/72  Pulse: 80  SpO2: 97%  Weight: 127.5 kg (281 lb 2 oz)    PHYSICAL EXAM: General:  Well appearing. No respiratory difficulty HEENT: normal Neck: supple. no JVD. Carotids 2+ bilat; no bruits. No lymphadenopathy or thryomegaly appreciated. Cor: PMI nondisplaced. Regular rate & rhythm. No rubs, gallops or murmurs. Lungs: clear Abdomen: soft, nontender, nondistended. No hepatosplenomegaly. No bruits or masses. Good bowel sounds. Extremities: no cyanosis, clubbing, rash, edema Neuro: alert & oriented x 3, cranial nerves grossly intact. moves all 4 extremities w/o difficulty. Affect pleasant.  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 EF 60-65% on echo 08/23. Recent STEMI 03/24. EF on echo 40-45% NYHA II. Volume stable on exam. Has furosemide to use PRN.  GDMT  BB-Metoprolol xl 25 mg daily Ace/ARB/ARNI-Entresto 24/26 mg BID MRA-Adding spiro 12.5 mg daily. Stop potassium supplement. SGLT2i-Farxiga 10 mg daily -Consider  repeat echo in a couple of months to assess recovery in LV function.  If EF has not recovered, would consider zio monitor to assess PVC burden. PVCs noted on recent ECGs. -BMET/BNP today, BMET at f/u with Cards  Coronary artery disease with recent STEMI -Hx CABG -STEMI 03/24: Culprit lesion SVG to PDA treated medically -No recurrent angina -Continue aspirin and plavix, beta  blocker and high-intensity statin per cardiology  HTN -Recent blood pressures reasonably controlled, 120s-130s systolic. Improved since ED visit -Adding spiro today as above  HLD -Rosuvastatin recently increased during hospitalization -Management per Cardiology team  OSA -On CPAP   Referred to HFSW (PCP, Medications, Transportation, ETOH Abuse, Drug Abuse, Insurance, Financial ): No Refer to Pharmacy: No Refer to Home Health: No Refer to Advanced Heart Failure Clinic: No Refer to General Cardiology: No, already established  Follow up  As needed, has f/u with Cardiology on 04/29 (would obtain repeat BMP at that time)

## 2023-02-17 NOTE — Progress Notes (Signed)
Patient ID: Steven Munoz, male   DOB: [birth year], 82 y.o.   MRN: [id number]     82 y.o. CABG August 2010 He had ostial LAD, distal RCA/PDA disease and had LIMA to LAD SVG Sequential to PDA and PLB EF normal circumflex not grafted. Dr Vassie Loll follow him for OSA and CPAP Activity limited by right hip pain post surgery at [facility] and f/u tendon release. Dr Iona Hansen.  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    Seen in ED 02/06/23 for elevated BP and atypical chest pain. 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 Has had vertigo and peripheral neuropathy R xwith neurontin   Has gotten to VIR to drive his Alicia Amel 3 days this year Given neuropathy will Likely have to stop driving   He has a son Olegario Shearer who is dentist in [place] with two grand sons   Doing much better needs referral to cardiac rehab Using recumbent bike at home and going to gym Groin hematoma looks much better and not painful Has lost 15 lbs since being home     ROS: Denies fever, malais, weight loss, blurry vision, decreased visual acuity, cough, sputum, SOB, hemoptysis, pleuritic pain, palpitaitons, heartburn, abdominal pain, melena, lower extremity edema, claudication, or rash.  All other systems reviewed and negative  General: BP 132/64   Pulse (!) 44   Ht 6' 0.5" (1.842 m)   Wt 273 lb 12.8 oz (124.2 kg)   SpO2 97%   BMI 36.62 kg/m  Affect appropriate Overweight white male  RLE varicosities HEENT: Tinnitus  Neck supple  with no adenopathy JVP normal no bruits no thyromegaly Lungs clear with no wheezing and good diaphragmatic motion Heart:  S1/S2 SEM  murmur, no rub, gallop or click PMI normal Abdomen: benighn, BS positve, no tenderness, no AAA no bruit.  No HSM or HJR Distal pulses intact with no bruits Peripheral neuropathy in feet  Skin warm and dry Right TKR with RLE varicosities and mild edema    Current Outpatient Medications  Medication Sig Dispense Refill   allopurinol (ZYLOPRIM) 100 MG tablet Take 1 tablet (100 mg total) by mouth daily. 90 tablet 1   aspirin EC 81 MG tablet Take 81 mg by mouth daily.     clopidogrel (PLAVIX) 75 MG tablet Take 1 tablet (75 mg total) by mouth daily with breakfast. 90 tablet 2   Coenzyme Q10 (COQ10 PO) Take 1 capsule by mouth daily.     colestipol (COLESTID) 1 g tablet Take 1 g by mouth daily.     Cyanocobalamin (VITAMIN B-12 PO) Take 1 tablet by mouth daily.     dapagliflozin propanediol (FARXIGA) 10 MG TABS tablet Take 1 tablet (10 mg total) by mouth daily. 90 tablet 0   famotidine (PEPCID) 40 MG tablet Take 40 mg by mouth at bedtime.     fluorouracil (EFUDEX) 5 % cream Apply 1 application  topically daily as needed (facial rash).  furosemide (LASIX) 20 MG tablet Take 1 tablet (20 mg total) by mouth daily as needed. 30 tablet 1   hydrALAZINE (APRESOLINE) 10 MG tablet Take 1 tablet (10 mg total) by mouth 2 (two) times daily as needed. Take one tablet as needed for systolic BP >150 60 tablet 0   levothyroxine (SYNTHROID) 125 MCG tablet Take 1 tablet (125 mcg total) by mouth daily before breakfast. 90 tablet 1   magnesium oxide (MAG-OX) 400 (240 Mg) MG tablet Take 400 mg by mouth at bedtime.     metoprolol succinate (TOPROL-XL) 25 MG 24 hr tablet Take 1 tablet (25 mg total) by mouth daily. 90 tablet 1   Multiple Vitamins-Minerals (PRESERVISION AREDS 2+MULTI VIT) CAPS Take 1 capsule by mouth 2 (two) times daily.      nitroGLYCERIN (NITROSTAT) 0.4 MG SL tablet  Place 1 tablet (0.4 mg total) under the tongue every 5 (five) minutes as needed. 25 tablet 2   Omega-3 Fatty Acids (FISH OIL PO) Take 1 capsule by mouth daily.     rosuvastatin (CRESTOR) 40 MG tablet Take 1 tablet (40 mg total) by mouth daily. 90 tablet 1   sacubitril-valsartan (ENTRESTO) 49-51 MG Take 1 tablet by mouth 2 (two) times daily. 60 tablet 1   spironolactone (ALDACTONE) 25 MG tablet Take 0.5 tablets (12.5 mg total) by mouth daily. 45 tablet 3   No current facility-administered medications for this visit.    Allergies  Povidone-iodine, Ofloxacin, and Tape  Electrocardiogram:  08/14/20 SR rate 53 normal 02/24/2023 NSR rate 62 PR 222 msec stable 02/24/2023 SR rate 52 PR 216 msec   Assessment and Plan  Murmur: Mild AS by TTE 06/18/22 consider f/u in 2 years   CAD/CABG: 2010 [place] to LAD and SVG to PDA/PLB  SEMI 01/2023 with thrombotic lesion to distal anastomosis of PDA graft Rx medically Complicated by large retroperitoneal bleed No vessel extravasation on CT 01/29/23  Hct stable 02/06/23 35.4 Continue ASA/Plavix given thrombotic lesion   PAF: post op no recurrence ASA and beta blocker LA severely dilated on 58 mm on TTE  Puts him at increased risk of recurrence   HTN: ARB changed to [facility] 02/06/23 with PRN hydralazine  improved .    Thyroid:  Lab Results  Component Value Date   TSH 1.00 12/16/2022    Cholesterol  Lab Results  Component Value Date   LDLCALC 35 01/28/2023    Ortho: post right hip surgery [name]  Post tendon release in June 2018  with relief of pain  First Degree: no change on ECG today no high grade AV block PR 216 msec  ENT:  F/u [name] bells palsy resolved still with tinnitus wearing hearing aids post tear duct surgery   Dyspnea:  Check BMET/BNP volume seems stable has Hydrodiuril as diuretic CXR ok  BMET/BNP  F/U in 6 months     Charlton Haws

## 2023-02-18 ENCOUNTER — Telehealth: Payer: Self-pay | Admitting: Cardiovascular Disease

## 2023-02-18 ENCOUNTER — Other Ambulatory Visit (HOSPITAL_COMMUNITY): Payer: Self-pay

## 2023-02-18 MED ORDER — ENTRESTO 49-51 MG PO TABS: 1.0000 | ORAL_TABLET | Freq: Two times a day (BID) | ORAL | 1 refills | Status: DC

## 2023-02-18 NOTE — Telephone Encounter (Signed)
Called patient back about his message. Patient is concerned with his elevated BP and cannot get it under control with hydralazine 10 mg PRN. Consulted Dr. Eden Emms, he advised patient to increase his Entresto to 49/51 mg BID. Informed patient that he was fine to also take an extra dose of hydralazine later in the afternoon if he needs it. Patient verbalized understanding and will give our office a call if he has any other concerns.

## 2023-02-18 NOTE — Telephone Encounter (Signed)
Pt c/o BP issue: STAT if pt c/o blurred vision, one-sided weakness or slurred speech  1. What are your last 5 BP readings?  02/18/23  3:58 am 177/127  4:00 am 169/129 (took a hydralazine)  4:10 am 146/97  4:16 am 145/80  4:31 am 140/72  4:46 am 124/55  5:01 am 120/57  8:24 am 157/101  8:28 am 155/89  8:31 am 146/76  8:40 am 141/94  2. Are you having any other symptoms (ex. Dizziness, headache, blurred vision, passed out)? No   3. What is your BP issue? BP is fluctuating

## 2023-02-21 ENCOUNTER — Ambulatory Visit: Payer: Self-pay

## 2023-02-21 NOTE — Patient Instructions (Addendum)
Visit Information  Thank you for taking time to visit with me today. Please don't hesitate to contact me if I can be of assistance to you.   Following are the goals we discussed today:  Continue to monitor weights and BP, notify provider if any questions or concerns Continue to attend provider visits as scheduled Contact provider if any signs/symptoms of worsening HF: increase weight gain 2-3 lb in a 24 hour period or 5 pounds in a week; increase edema, SOB, increase fatigue Contact RNCM if care coordination needs in the future  If you are experiencing a Mental Health or Behavioral Health Crisis or need someone to talk to, please call the Suicide and Crisis Lifeline: 11  Kathyrn Sheriff, RN, MSN, BSN, CCM [facility] Care Coordinator [phone]

## 2023-02-21 NOTE — Patient Outreach (Signed)
  Care Coordination   Follow Up Visit Note   02/21/2023 Name: Steven Munoz MRN: [id number] DOB: 10-25-1941  [name] is a 82 y.o. year old male who sees Drue Novel, Nolon Rod, MD for primary care. I spoke with  Bettey Mare by phone today.  What matters to the patients health and wellness today?  Mr. Goodpasture reports he is doing well. Completed appointment with advanced HF clinic on 02/13/23. Has an upcoming appointment with cardiologist on 02/24/23. He reports bruising to groin area improving. He state blood pressure is now within normal range for patient. He reports he is eating health and does not use salt on food. He denies any edema or weight gain. He denies any questions or concern and denies any care coordination, disease management or resource needs at this time. Patient to contact RNCM if care coordination needs in the future.  Goals Addressed             This Visit's Progress    COMPLETED: Continue to improve post hospitalization       Interventions Today    Flowsheet Row Most Recent Value  Chronic Disease   Chronic disease during today's visit Congestive Heart Failure (CHF)  General Interventions   General Interventions Discussed/Reviewed General Interventions Reviewed  [encouraged to call if care coordination needs in the futrue. provided contact number]  Doctor Visits Discussed/Reviewed Doctor Visits Reviewed  PCP/Specialist Visits Compliance with follow-up visit  Education Interventions   Education Provided Provided Education  Provided Verbal Education On Other  [encouraged to continue to weigh self daily, take medcations as prescribed, attend provider visit as scheduled]  Nutrition Interventions   Nutrition Discussed/Reviewed Nutrition Reviewed  [encouraged to continue to eat healthy, minimize salt intake. discussed there is 2325mg  of sodium in a tsp of salt.]  Pharmacy Interventions   Pharmacy Dicussed/Reviewed Pharmacy Topics Reviewed            SDOH  assessments and interventions completed:  No  Care Coordination Interventions:  Yes, provided   Follow up plan: No further intervention required.   Encounter Outcome:  Pt. Visit Completed   Kathyrn Sheriff, RN, MSN, BSN, CCM Care Management Coordinator [facility] [phone]

## 2023-02-24 ENCOUNTER — Encounter: Payer: Self-pay | Admitting: Cardiovascular Disease

## 2023-02-24 ENCOUNTER — Ambulatory Visit: Payer: Medicare HMO | Attending: Cardiovascular Disease | Admitting: Cardiovascular Disease

## 2023-02-24 VITALS — BP 132/64 | HR 44 | Ht 72.5 in | Wt 273.8 lb

## 2023-02-24 DIAGNOSIS — I10 Essential (primary) hypertension: Secondary | ICD-10-CM

## 2023-02-24 DIAGNOSIS — I25810 Atherosclerosis of coronary artery bypass graft(s) without angina pectoris: Secondary | ICD-10-CM

## 2023-02-24 DIAGNOSIS — I493 Ventricular premature depolarization: Secondary | ICD-10-CM

## 2023-02-24 DIAGNOSIS — I25119 Atherosclerotic heart disease of native coronary artery with unspecified angina pectoris: Secondary | ICD-10-CM | POA: Diagnosis not present

## 2023-02-24 DIAGNOSIS — S301XXD Contusion of abdominal wall, subsequent encounter: Secondary | ICD-10-CM

## 2023-02-24 MED ORDER — DAPAGLIFLOZIN PROPANEDIOL 10 MG PO TABS: 10.0000 mg | ORAL_TABLET | Freq: Every day | ORAL | 3 refills | Status: DC

## 2023-02-24 NOTE — Patient Instructions (Signed)
Medication Instructions:  Your physician recommends that you continue on your current medications as directed. Please refer to the Current Medication list given to you today.  *If you need a refill on your cardiac medications before your next appointment, please call your pharmacy*  Lab Work: If you have labs (blood work) drawn today and your tests are completely normal, you will receive your results only by: MyChart Message (if you have MyChart) OR A paper copy in the mail If you have any lab test that is abnormal or we need to change your treatment, we will call you to review the results.  Testing/Procedures: None ordered today.  Follow-Up: At  [facility], you and your health needs are our priority.  As part of our continuing mission to provide you with exceptional heart care, we have created designated Provider Care Teams.  These Care Teams include your primary Cardiologist (physician) and Advanced Practice Providers (APPs -  Physician Assistants and Nurse Practitioners) who all work together to provide you with the care you need, when you need it.  We recommend signing up for the patient portal called "MyChart".  Sign up information is provided on this After Visit Summary.  MyChart is used to connect with patients for Virtual Visits (Telemedicine).  Patients are able to view lab/test results, encounter notes, upcoming appointments, etc.  Non-urgent messages can be sent to your provider as well.   To learn more about what you can do with MyChart, go to [email or web address].    Your next appointment:   6 month(s)  Provider:   Peter Nishan, MD     

## 2023-02-27 ENCOUNTER — Encounter (HOSPITAL_COMMUNITY): Payer: Self-pay

## 2023-02-28 ENCOUNTER — Telehealth (HOSPITAL_COMMUNITY): Payer: Self-pay

## 2023-02-28 NOTE — Telephone Encounter (Signed)
Reviewed with patient the Cardiac Rehab Cardiac Risk Prolife Nursing Assessment. Patient knows where our office is located.  

## 2023-03-04 ENCOUNTER — Encounter (HOSPITAL_COMMUNITY)
Admission: RE | Admit: 2023-03-04 | Discharge: 2023-03-04 | Disposition: A | Discharge status: Home / Self Care | Payer: Medicare HMO | Source: Ambulatory Visit | Attending: Cardiovascular Disease | Admitting: Cardiovascular Disease

## 2023-03-04 ENCOUNTER — Encounter (HOSPITAL_COMMUNITY): Payer: Self-pay

## 2023-03-04 VITALS — BP 102/52 | HR 86 | Ht 71.25 in | Wt 273.6 lb

## 2023-03-04 DIAGNOSIS — I213 ST elevation (STEMI) myocardial infarction of unspecified site: Secondary | ICD-10-CM

## 2023-03-04 NOTE — Progress Notes (Signed)
Cardiac Individual Treatment Plan  Patient Details  Name: Steven Munoz MRN: [id number] Date of Birth: [birth year] Referring Provider:   Flowsheet Row INTENSIVE CARDIAC REHAB ORIENT from 03/04/2023 in [facility]  Referring Provider Wendall Stade, MD       Initial Encounter Date:  Flowsheet Row INTENSIVE CARDIAC REHAB ORIENT from 03/04/2023 in [facility] for Heart, Vascular, [facility] Lung Health  Date 03/04/23       Visit Diagnosis: 01/25/23 STEMI  Patient's Home Medications on Admission:  Current Outpatient Medications:    allopurinol (ZYLOPRIM) 100 MG tablet, Take 1 tablet (100 mg total) by mouth daily., Disp: 90 tablet, Rfl: 1   aspirin EC 81 MG tablet, Take 81 mg by mouth daily., Disp: , Rfl:    clopidogrel (PLAVIX) 75 MG tablet, Take 1 tablet (75 mg total) by mouth daily with breakfast., Disp: 90 tablet, Rfl: 2   Coenzyme Q10 (COQ10 PO), Take 1 capsule by mouth daily., Disp: , Rfl:    colestipol (COLESTID) 1 g tablet, Take 1 g by mouth daily., Disp: , Rfl:    Cyanocobalamin (VITAMIN B-12 PO), Take 1 tablet by mouth daily., Disp: , Rfl:    dapagliflozin propanediol (FARXIGA) 10 MG TABS tablet, Take 1 tablet (10 mg total) by mouth daily., Disp: 90 tablet, Rfl: 3   famotidine (PEPCID) 40 MG tablet, Take 40 mg by mouth at bedtime., Disp: , Rfl:    furosemide (LASIX) 20 MG tablet, Take 1 tablet (20 mg total) by mouth daily as needed., Disp: 30 tablet, Rfl: 1   hydrALAZINE (APRESOLINE) 10 MG tablet, Take 1 tablet (10 mg total) by mouth 2 (two) times daily as needed. Take one tablet as needed for systolic BP >150, Disp: 60 tablet, Rfl: 0   levothyroxine (SYNTHROID) 125 MCG tablet, Take 1 tablet (125 mcg total) by mouth daily before breakfast., Disp: 90 tablet, Rfl: 1   magnesium oxide (MAG-OX) 400 (240 Mg) MG tablet, Take 400 mg by mouth at bedtime., Disp: , Rfl:    Multiple Vitamins-Minerals (PRESERVISION  AREDS 2+MULTI VIT) CAPS, Take 1 capsule by mouth 2 (two) times daily. , Disp: , Rfl:    nitroGLYCERIN (NITROSTAT) 0.4 MG SL tablet, Place 1 tablet (0.4 mg total) under the tongue every 5 (five) minutes as needed., Disp: 25 tablet, Rfl: 2   Omega-3 Fatty Acids (FISH OIL PO), Take 1 capsule by mouth daily., Disp: , Rfl:    rosuvastatin (CRESTOR) 40 MG tablet, Take 1 tablet (40 mg total) by mouth daily., Disp: 90 tablet, Rfl: 1   sacubitril-valsartan (ENTRESTO) 49-51 MG, Take 1 tablet by mouth 2 (two) times daily., Disp: 60 tablet, Rfl: 1   spironolactone (ALDACTONE) 25 MG tablet, Take 0.5 tablets (12.5 mg total) by mouth daily., Disp: 45 tablet, Rfl: 3   fluorouracil (EFUDEX) 5 % cream, Apply 1 application  topically daily as needed (facial rash). (Patient not taking: Reported on 03/04/2023), Disp: , Rfl:    metoprolol succinate (TOPROL-XL) 25 MG 24 hr tablet, Take 1 tablet (25 mg total) by mouth daily., Disp: 90 tablet, Rfl: 1  Past Medical History: Past Medical History:  Diagnosis Date   CAD (coronary artery disease) CABG  05/29/09    OPERATIVE PROCEDURES:  Median sternotomy, extracorporeal circulation,    Colonic polyp 2002, 2005, 2009   Dr Kinnie Scales   Dermatophytosis of nail    Diverticulosis    Dysmetabolic syndrome    GERD (gastroesophageal reflux  disease)    S/P dilation  2005   Gout    Hyperlipidemia    Hypertension    Hypothyroidism    Murmur    Nephrolithiasis    X 1   Peripheral neuropathy 03/20/2021   Pneumonia age 36   treated as inpatient   Sleep apnea    on CPAP; Dr Vassie Loll    Tobacco Use: Social History   Tobacco Use  Smoking Status Former   Packs/day: 1.00   Years: 6.00   Additional pack years: 0.00   Total pack years: 6.00   Types: Cigarettes   Quit date: 10/28/1966   Years since quitting: 56.3  Smokeless Tobacco Never  Tobacco Comments   smoked 1962- 1968, up to < 1 ppd    Labs: Review Flowsheet  More data exists      Latest Ref Rng & Units 02/26/2021 07/04/2021  05/09/2022 08/14/2022 01/28/2023  Labs for ITP Cardiac and Pulmonary Rehab  Cholestrol 0 - 200 mg/dL - 409  - 811  86   LDL (calc) 0 - 99 mg/dL - 57  - 55  35   HDL-C >40 mg/dL - 91.47  - 82.95  37   Trlycerides <150 mg/dL - 621.3  - 086.5  72   Hemoglobin A1c 4.6 - 6.5 % 5.7  - 5.7  - -    Capillary Blood Glucose: Lab Results  Component Value Date   GLUCAP 87 07/12/2009   GLUCAP 94 07/12/2009   GLUCAP 101 (H) 07/10/2009   GLUCAP 101 (H) 07/10/2009   GLUCAP 117 (H) 07/07/2009     Exercise Target Goals: Exercise Program Goal: Individual exercise prescription set using results from initial 6 min walk test and THRR while considering  patient's activity barriers and safety.   Exercise Prescription Goal: Initial exercise prescription builds to 30-45 minutes a day of aerobic activity, 2-3 days per week.  Home exercise guidelines will be given to patient during program as part of exercise prescription that the participant will acknowledge.  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    Cardiac Risk Stratification High             6 Minute Walk:  6 Minute Walk     Row Name 03/04/23 1035         6 Minute Walk   Phase Initial     Distance 1213 feet     Walk Time 6 minutes     # of Rest Breaks 0     MPH 2.3     METS 1.56     RPE 9     Perceived Dyspnea  0     VO2 Peak 5.46     Symptoms Yes (comment)     Comments Bilateral feet pain from neuropathy, which he rates a 1-2 out of 10 on the pain scale and right hip pain, which he also rates a 1-2 out of 10 on the pain scale.      Resting HR 86 bpm     Resting BP 102/52     Resting Oxygen Saturation  98 %     Exercise Oxygen Saturation  during 6 min  walk 96 %     Max Ex. HR 93 bpm     Max Ex. BP 152/62     2 Minute Post BP 122/52              Oxygen Initial Assessment:   Oxygen Re-Evaluation:   Oxygen Discharge (Final Oxygen Re-Evaluation):   Initial Exercise Prescription:  Initial Exercise Prescription - 03/04/23 1200       Date of Initial Exercise RX and Referring Provider   Date 03/04/23    Referring Provider Wendall Stade, MD    Expected Discharge Date 05/16/23      Treadmill   MPH 1.7    Grade 0    Minutes 15    METs 2.3      T5 Nustep   Level 3    SPM 85    Minutes 15    METs 2.3      Prescription Details   Frequency (times per week) 3    Duration Progress to 30 minutes of continuous aerobic without signs/symptoms of physical distress      Intensity   THRR 40-80% of Max Heartrate 55-110    Ratings of Perceived Exertion 11-13    Perceived Dyspnea 0-4      Progression   Progression Continue to progress workloads to maintain intensity without signs/symptoms of physical distress.      Resistance Training   Training Prescription Yes    Weight 3 lbs    Reps 10-15             Perform Capillary Blood Glucose checks as needed.  Exercise Prescription Changes:   Exercise Comments:   Exercise Goals and Review:   Exercise Goals     Row Name 03/04/23 0920             Exercise Goals   Increase Physical Activity Yes       Intervention Provide advice, education, support and counseling about physical activity/exercise needs.;Develop an individualized exercise prescription for aerobic and resistive training based on initial evaluation findings, risk stratification, comorbidities and participant's personal goals.       Expected Outcomes Short Term: Attend rehab on a regular basis to increase amount of physical activity.;Long Term: Add in home exercise to make  exercise part of routine and to increase amount of physical activity.;Long Term: Exercising regularly at least 3-5 days a week.       Increase Strength and Stamina Yes       Intervention Provide advice, education, support and counseling about physical activity/exercise needs.;Develop an individualized exercise prescription for aerobic and resistive training based on initial evaluation findings, risk stratification, comorbidities and participant's personal goals.       Expected Outcomes Short Term: Increase workloads from initial exercise prescription for resistance, speed, and METs.;Short Term: Perform resistance training exercises routinely during rehab and add in resistance training at home;Long Term: Improve cardiorespiratory fitness, muscular endurance and strength as measured by increased METs and functional capacity ( )       Able to understand and use rate of perceived exertion (RPE) scale Yes       Intervention Provide education and explanation on how to use RPE scale       Expected Outcomes Short Term: Able to use RPE daily in rehab to express subjective intensity level;Long Term:  Able to use RPE to guide intensity level when exercising independently       Knowledge and understanding of Target Heart Rate Range (THRR) Yes  Intervention Provide education and explanation of THRR including how the numbers were predicted and where they are located for reference       Expected Outcomes Short Term: Able to state/look up THRR;Long Term: Able to use THRR to govern intensity when exercising independently;Short Term: Able to use daily as guideline for intensity in rehab       Able to check pulse independently Yes       Intervention Provide education and demonstration on how to check pulse in carotid and radial arteries.;Review the importance of being able to check your own pulse for safety during independent exercise       Expected Outcomes Short Term: Able to explain why pulse checking is  important during independent exercise;Long Term: Able to check pulse independently and accurately       Understanding of Exercise Prescription Yes       Intervention Provide education, explanation, and written materials on patient's individual exercise prescription       Expected Outcomes Short Term: Able to explain program exercise prescription;Long Term: Able to explain home exercise prescription to exercise independently                Exercise Goals Re-Evaluation :   Discharge Exercise Prescription (Final Exercise Prescription Changes):   Nutrition:  Target Goals: Understanding of nutrition guidelines, daily intake of sodium 1500mg , cholesterol 200mg , calories 30% from fat and 7% or less from saturated fats, daily to have 5 or more servings of fruits and vegetables.  Biometrics:  Pre Biometrics - 03/04/23 0908       Pre Biometrics   Waist Circumference 50.5 inches    Hip Circumference 53 inches    Waist to Hip Ratio 0.95 %    Triceps Skinfold 16 mm    % Body Fat 36.6 %    Grip Strength 36 kg    Flexibility --   Not performed, right hip replacement.   Single Leg Stand 1.75 seconds              Nutrition Therapy Plan and Nutrition Goals:   Nutrition Assessments:  MEDIFICTS Score Key: ?70 Need to make dietary changes  40-70 Heart Healthy Diet ? 40 Therapeutic Level Cholesterol Diet    Picture Your Plate Scores: <16 Unhealthy dietary pattern with much room for improvement. 41-50 Dietary pattern unlikely to meet recommendations for good health and room for improvement. 51-60 More healthful dietary pattern, with some room for improvement.  >60 Healthy dietary pattern, although there may be some specific behaviors that could be improved.    Nutrition Goals Re-Evaluation:   Nutrition Goals Re-Evaluation:   Nutrition Goals Discharge (Final Nutrition Goals Re-Evaluation):   Psychosocial: Target Goals: Acknowledge presence or absence of significant  depression and/or stress, maximize coping skills, provide positive support system. Participant is able to verbalize types and ability to use techniques and skills needed for reducing stress and depression.  Initial Review & Psychosocial Screening:  Initial Psych Review & Screening - 03/04/23 1212       Initial Review   Current issues with None Identified      Family Dynamics   Good Support System? Yes   Patient has his wife and  2 children for support     Screening Interventions   Interventions Encouraged to exercise    Expected Outcomes Long Term Goal: Stressors or current issues are controlled or eliminated.             Quality of Life Scores:  Quality of Life -  03/04/23 1343       Quality of Life   Select Quality of Life      Quality of Life Scores   Health/Function Pre 29.6 %    Socioeconomic Pre 30 %    Psych/Spiritual Pre 25.71 %    Family Pre 30 %    GLOBAL Pre 28.94 %            Scores of 19 and below usually indicate a poorer quality of life in these areas.  A difference of  2-3 points is a clinically meaningful difference.  A difference of 2-3 points in the total score of the Quality of Life Index has been associated with significant improvement in overall quality of life, self-image, physical symptoms, and general health in studies assessing change in quality of life.  PHQ-9: Review Flowsheet  More data exists      03/04/2023 12/16/2022 11/25/2022 08/14/2022 08/13/2022  Depression screen PHQ 2/9  Decreased Interest 0 0 0 0 0  Down, Depressed, Hopeless 0 0 0 0 0  PHQ - 2 Score 0 0 0 0 0  Altered sleeping 0 - - - -  Tired, decreased energy 0 - - - -  Change in appetite 0 - - - -  Feeling bad or failure about yourself  0 - - - -  Trouble concentrating 0 - - - -  Moving slowly or fidgety/restless 0 - - - -  Suicidal thoughts 0 - - - -  PHQ-9 Score 0 - - - -   Interpretation of Total Score  Total Score Depression Severity:  1-4 = Minimal depression,  5-9 = Mild depression, 10-14 = Moderate depression, 15-19 = Moderately severe depression, 20-27 = Severe depression   Psychosocial Evaluation and Intervention:   Psychosocial Re-Evaluation:   Psychosocial Discharge (Final Psychosocial Re-Evaluation):   Vocational Rehabilitation: Provide vocational rehab assistance to qualifying candidates.   Vocational Rehab Evaluation & Intervention:  Vocational Rehab - 03/04/23 1212       Initial Vocational Rehab Evaluation & Intervention   Assessment shows need for Vocational Rehabilitation No   Mr Manuel is retired and does not need vocational rehab at this time            Education: Education Goals: Education classes will be provided on a weekly basis, covering required topics. Participant will state understanding/return demonstration of topics presented.     Core Videos: Exercise    Move It!  Clinical staff conducted group or individual video education with verbal and written material and guidebook.  Patient learns the recommended Pritikin exercise program. Exercise with the goal of living a long, healthy life. Some of the health benefits of exercise include controlled diabetes, healthier blood pressure levels, improved cholesterol levels, improved heart and lung capacity, improved sleep, and better body composition. Everyone should speak with their doctor before starting or changing an exercise routine.  Biomechanical Limitations Clinical staff conducted group or individual video education with verbal and written material and guidebook.  Patient learns how biomechanical limitations can impact exercise and how we can mitigate and possibly overcome limitations to have an impactful and balanced exercise routine.  Body Composition Clinical staff conducted group or individual video education with verbal and written material and guidebook.  Patient learns that body composition (ratio of muscle mass to fat mass) is a key component to  assessing overall fitness, rather than body weight alone. Increased fat mass, especially visceral belly fat, can put [place] at increased risk for metabolic syndrome, type  2 diabetes, heart disease, and even death. It is recommended to combine diet and exercise (cardiovascular and resistance training) to improve your body composition. Seek guidance from your physician and exercise physiologist before implementing an exercise routine.  Exercise Action Plan Clinical staff conducted group or individual video education with verbal and written material and guidebook.  Patient learns the recommended strategies to achieve and enjoy long-term exercise adherence, including variety, self-motivation, self-efficacy, and positive decision making. Benefits of exercise include fitness, good health, weight management, more energy, better sleep, less stress, and overall well-being.  Medical   Heart Disease Risk Reduction Clinical staff conducted group or individual video education with verbal and written material and guidebook.  Patient learns our heart is our most vital organ as it circulates oxygen, nutrients, white blood cells, and hormones throughout the entire body, and carries waste away. Data supports a plant-based eating plan like the Pritikin Program for its effectiveness in slowing progression of and reversing heart disease. The video provides a number of recommendations to address heart disease.   Metabolic Syndrome and Belly Fat  Clinical staff conducted group or individual video education with verbal and written material and guidebook.  Patient learns what metabolic syndrome is, how it leads to heart disease, and how one can reverse it and keep it from coming back. You have metabolic syndrome if you have 3 of the following 5 criteria: abdominal obesity, high blood pressure, high triglycerides, low HDL cholesterol, and high blood sugar.  Hypertension and Heart Disease Clinical staff conducted group or  individual video education with verbal and written material and guidebook.  Patient learns that high blood pressure, or hypertension, is very common in the [place]. Hypertension is largely due to excessive salt intake, but other important risk factors include being overweight, physical inactivity, drinking too much alcohol, smoking, and not eating enough potassium from fruits and vegetables. High blood pressure is a leading risk factor for heart attack, stroke, congestive heart failure, dementia, kidney failure, and premature death. Long-term effects of excessive salt intake include stiffening of the arteries and thickening of heart muscle and organ damage. Recommendations include ways to reduce hypertension and the risk of heart disease.  Diseases of Our Time - Focusing on Diabetes Clinical staff conducted group or individual video education with verbal and written material and guidebook.  Patient learns why the best way to stop diseases of our time is prevention, through food and other lifestyle changes. Medicine (such as prescription pills and surgeries) is often only a Band-Aid on the problem, not a long-term solution. Most common diseases of our time include obesity, type 2 diabetes, hypertension, heart disease, and cancer. The Pritikin Program is recommended and has been proven to help reduce, reverse, and/or prevent the damaging effects of metabolic syndrome.  Nutrition   Overview of the Pritikin Eating Plan  Clinical staff conducted group or individual video education with verbal and written material and guidebook.  Patient learns about the Pritikin Eating Plan for disease risk reduction. The Pritikin Eating Plan emphasizes a wide variety of unrefined, minimally-processed carbohydrates, like fruits, vegetables, whole grains, and legumes. Go, Caution, and Stop food choices are explained. Plant-based and lean animal proteins are emphasized. Rationale provided for low sodium intake for blood  pressure control, low added sugars for blood sugar stabilization, and low added fats and oils for coronary artery disease risk reduction and weight management.  Calorie Density  Clinical staff conducted group or individual video education with verbal and written material and guidebook.  Patient learns about calorie density and how it impacts the Pritikin Eating Plan. Knowing the characteristics of the food you choose will help you decide whether those foods will lead to weight gain or weight loss, and whether you want to consume more or less of them. Weight loss is usually a side effect of the Pritikin Eating Plan because of its focus on low calorie-dense foods.  Label Reading  Clinical staff conducted group or individual video education with verbal and written material and guidebook.  Patient learns about the Pritikin recommended label reading guidelines and corresponding recommendations regarding calorie density, added sugars, sodium content, and whole grains.  Dining Out - Part 1  Clinical staff conducted group or individual video education with verbal and written material and guidebook.  Patient learns that restaurant meals can be sabotaging because they can be so high in calories, fat, sodium, and/or sugar. Patient learns recommended strategies on how to positively address this and avoid unhealthy pitfalls.  Facts on Fats  Clinical staff conducted group or individual video education with verbal and written material and guidebook.  Patient learns that lifestyle modifications can be just as effective, if not more so, as many medications for lowering your risk of heart disease. A Pritikin lifestyle can help to reduce your risk of inflammation and atherosclerosis (cholesterol build-up, or plaque, in the artery walls). Lifestyle interventions such as dietary choices and physical activity address the cause of atherosclerosis. A review of the types of fats and their impact on blood cholesterol levels,  along with dietary recommendations to reduce fat intake is also included.  Nutrition Action Plan  Clinical staff conducted group or individual video education with verbal and written material and guidebook.  Patient learns how to incorporate Pritikin recommendations into their lifestyle. Recommendations include planning and keeping personal health goals in mind as an important part of their success.  Healthy Mind-Set    Healthy Minds, Bodies, Hearts  Clinical staff conducted group or individual video education with verbal and written material and guidebook.  Patient learns how to identify when they are stressed. Video will discuss the impact of that stress, as well as the many benefits of stress management. Patient will also be introduced to stress management techniques. The way we think, act, and feel has an impact on our hearts.  How Our Thoughts Can Heal Our Hearts  Clinical staff conducted group or individual video education with verbal and written material and guidebook.  Patient learns that negative thoughts can cause depression and anxiety. This can result in negative lifestyle behavior and serious health problems. Cognitive behavioral therapy is an effective method to help control our thoughts in order to change and improve our emotional outlook.  Additional Videos:  Exercise    Improving Performance  Clinical staff conducted group or individual video education with verbal and written material and guidebook.  Patient learns to use a non-linear approach by alternating intensity levels and lengths of time spent exercising to help burn more calories and lose more body fat. Cardiovascular exercise helps improve heart health, metabolism, hormonal balance, blood sugar control, and recovery from fatigue. Resistance training improves strength, endurance, balance, coordination, reaction time, metabolism, and muscle mass. Flexibility exercise improves circulation, posture, and balance. Seek  guidance from your physician and exercise physiologist before implementing an exercise routine and learn your capabilities and proper form for all exercise.  Introduction to Yoga  Clinical staff conducted group or individual video education with verbal and written material and guidebook.  Patient learns about  yoga, a discipline of the coming together of mind, breath, and body. The benefits of yoga include improved flexibility, improved range of motion, better posture and core strength, increased lung function, weight loss, and positive self-image. Yoga's heart health benefits include lowered blood pressure, healthier heart rate, decreased cholesterol and triglyceride levels, improved immune function, and reduced stress. Seek guidance from your physician and exercise physiologist before implementing an exercise routine and learn your capabilities and proper form for all exercise.  Medical   Aging: Enhancing Your Quality of Life  Clinical staff conducted group or individual video education with verbal and written material and guidebook.  Patient learns key strategies and recommendations to stay in good physical health and enhance quality of life, such as prevention strategies, having an advocate, securing a Health Care Proxy and Power of Attorney, and keeping a list of medications and system for tracking them. It also discusses how to avoid risk for bone loss.  Biology of Weight Control  Clinical staff conducted group or individual video education with verbal and written material and guidebook.  Patient learns that weight gain occurs because we consume more calories than we burn (eating more, moving less). Even if your body weight is normal, you may have higher ratios of fat compared to muscle mass. Too much body fat puts you at increased risk for cardiovascular disease, heart attack, stroke, type 2 diabetes, and obesity-related cancers. In addition to exercise, following the Pritikin Eating Plan can help  reduce your risk.  Decoding Lab Results  Clinical staff conducted group or individual video education with verbal and written material and guidebook.  Patient learns that lab test reflects one measurement whose values change over time and are influenced by many factors, including medication, stress, sleep, exercise, food, hydration, pre-existing medical conditions, and more. It is recommended to use the knowledge from this video to become more involved with your lab results and evaluate your numbers to speak with your doctor.   Diseases of Our Time - Overview  Clinical staff conducted group or individual video education with verbal and written material and guidebook.  Patient learns that according to the CDC, 50% to 70% of chronic diseases (such as obesity, type 2 diabetes, elevated lipids, hypertension, and heart disease) are avoidable through lifestyle improvements including healthier food choices, listening to satiety cues, and increased physical activity.  Sleep Disorders Clinical staff conducted group or individual video education with verbal and written material and guidebook.  Patient learns how good quality and duration of sleep are important to overall health and well-being. Patient also learns about sleep disorders and how they impact health along with recommendations to address them, including discussing with a physician.  Nutrition  Dining Out - Part 2 Clinical staff conducted group or individual video education with verbal and written material and guidebook.  Patient learns how to plan ahead and communicate in order to maximize their dining experience in a healthy and nutritious manner. Included are recommended food choices based on the type of restaurant the patient is visiting.   Fueling a Banker conducted group or individual video education with verbal and written material and guidebook.  There is a strong connection between our food choices and our health.  Diseases like obesity and type 2 diabetes are very prevalent and are in large-part due to lifestyle choices. The Pritikin Eating Plan provides plenty of food and hunger-curbing satisfaction. It is easy to follow, affordable, and helps reduce health risks.  Menu Workshop  Clinical  staff conducted group or individual video education with verbal and written material and guidebook.  Patient learns that restaurant meals can sabotage health goals because they are often packed with calories, fat, sodium, and sugar. Recommendations include strategies to plan ahead and to communicate with the manager, chef, or server to help order a healthier meal.  Planning Your Eating Strategy  Clinical staff conducted group or individual video education with verbal and written material and guidebook.  Patient learns about the Pritikin Eating Plan and its benefit of reducing the risk of disease. The Pritikin Eating Plan does not focus on calories. Instead, it emphasizes high-quality, nutrient-rich foods. By knowing the characteristics of the foods, we choose, we can determine their calorie density and make informed decisions.  Targeting Your Nutrition Priorities  Clinical staff conducted group or individual video education with verbal and written material and guidebook.  Patient learns that lifestyle habits have a tremendous impact on disease risk and progression. This video provides eating and physical activity recommendations based on your personal health goals, such as reducing LDL cholesterol, losing weight, preventing or controlling type 2 diabetes, and reducing high blood pressure.  Vitamins and Minerals  Clinical staff conducted group or individual video education with verbal and written material and guidebook.  Patient learns different ways to obtain key vitamins and minerals, including through a recommended healthy diet. It is important to discuss all supplements you take with your doctor.   Healthy Mind-Set     Smoking Cessation  Clinical staff conducted group or individual video education with verbal and written material and guidebook.  Patient learns that cigarette smoking and tobacco addiction pose a serious health risk which affects millions of people. Stopping smoking will significantly reduce the risk of heart disease, lung disease, and many forms of cancer. Recommended strategies for quitting are covered, including working with your doctor to develop a successful plan.  Culinary   Becoming a Set designer conducted group or individual video education with verbal and written material and guidebook.  Patient learns that cooking at home can be healthy, cost-effective, quick, and puts them in control. Keys to cooking healthy recipes will include looking at your recipe, assessing your equipment needs, planning ahead, making it simple, choosing cost-effective seasonal ingredients, and limiting the use of added fats, salts, and sugars.  Cooking - Breakfast and Snacks  Clinical staff conducted group or individual video education with verbal and written material and guidebook.  Patient learns how important breakfast is to satiety and nutrition through the entire day. Recommendations include key foods to eat during breakfast to help stabilize blood sugar levels and to prevent overeating at meals later in the day. Planning ahead is also a key component.  Cooking - Educational psychologist conducted group or individual video education with verbal and written material and guidebook.  Patient learns eating strategies to improve overall health, including an approach to cook more at home. Recommendations include thinking of animal protein as a side on your plate rather than center stage and focusing instead on lower calorie dense options like vegetables, fruits, whole grains, and plant-based proteins, such as beans. Making sauces in large quantities to freeze for later and leaving the skin  on your vegetables are also recommended to maximize your experience.  Cooking - Healthy Salads and Dressing Clinical staff conducted group or individual video education with verbal and written material and guidebook.  Patient learns that vegetables, fruits, whole grains, and legumes are the foundations of  the Pritikin Eating Plan. Recommendations include how to incorporate each of these in flavorful and healthy salads, and how to create homemade salad dressings. Proper handling of ingredients is also covered. Cooking - Soups and [employer] - Soups and Desserts Clinical staff conducted group or individual video education with verbal and written material and guidebook.  Patient learns that Pritikin soups and desserts make for easy, nutritious, and delicious snacks and meal components that are low in sodium, fat, sugar, and calorie density, while high in vitamins, minerals, and filling fiber. Recommendations include simple and healthy ideas for soups and desserts.   Overview     The Pritikin Solution Program Overview Clinical staff conducted group or individual video education with verbal and written material and guidebook.  Patient learns that the results of the Pritikin Program have been documented in more than 100 articles published in peer-reviewed journals, and the benefits include reducing risk factors for (and, in some cases, even reversing) high cholesterol, high blood pressure, type 2 diabetes, obesity, and more! An overview of the three key pillars of the Pritikin Program will be covered: eating well, doing regular exercise, and having a healthy mind-set.  WORKSHOPS  Exercise: Exercise Basics: Building Your Action Plan Clinical staff led group instruction and group discussion with PowerPoint presentation and patient guidebook. To enhance the learning environment the use of posters, models and videos may be added. At the conclusion of this workshop, patients will comprehend the  difference between physical activity and exercise, as well as the benefits of incorporating both, into their routine. Patients will understand the FITT (Frequency, Intensity, Time, and Type) principle and how to use it to build an exercise action plan. In addition, safety concerns and other considerations for exercise and cardiac rehab will be addressed by the presenter. The purpose of this lesson is to promote a comprehensive and effective weekly exercise routine in order to improve patients' overall level of fitness.   Managing Heart Disease: Your Path to a Healthier Heart Clinical staff led group instruction and group discussion with PowerPoint presentation and patient guidebook. To enhance the learning environment the use of posters, models and videos may be added.At the conclusion of this workshop, patients will understand the anatomy and physiology of the heart. Additionally, they will understand how Pritikin's three pillars impact the risk factors, the progression, and the management of heart disease.  The purpose of this lesson is to provide a high-level overview of the heart, heart disease, and how the Pritikin lifestyle positively impacts risk factors.  Exercise Biomechanics Clinical staff led group instruction and group discussion with PowerPoint presentation and patient guidebook. To enhance the learning environment the use of posters, models and videos may be added. Patients will learn how the structural parts of their bodies function and how these functions impact their daily activities, movement, and exercise. Patients will learn how to promote a neutral spine, learn how to manage pain, and identify ways to improve their physical movement in order to promote healthy living. The purpose of this lesson is to expose patients to common physical limitations that impact physical activity. Participants will learn practical ways to adapt and manage aches and pains, and to minimize their  effect on regular exercise. Patients will learn how to maintain good posture while sitting, walking, and lifting.  Balance Training and Fall Prevention  Clinical staff led group instruction and group discussion with PowerPoint presentation and patient guidebook. To enhance the learning environment the use of posters, models and videos may  be added. At the conclusion of this workshop, patients will understand the importance of their sensorimotor skills (vision, proprioception, and the vestibular system) in maintaining their ability to balance as they age. Patients will apply a variety of balancing exercises that are appropriate for their current level of function. Patients will understand the common causes for poor balance, possible solutions to these problems, and ways to modify their physical environment in order to minimize their fall risk. The purpose of this lesson is to teach patients about the importance of maintaining balance as they age and ways to minimize their risk of falling.  WORKSHOPS   Nutrition:  Fueling a Ship broker led group instruction and group discussion with PowerPoint presentation and patient guidebook. To enhance the learning environment the use of posters, models and videos may be added. Patients will review the foundational principles of the Pritikin Eating Plan and understand what constitutes a serving size in each of the food groups. Patients will also learn Pritikin-friendly foods that are better choices when away from home and review make-ahead meal and snack options. Calorie density will be reviewed and applied to three nutrition priorities: weight maintenance, weight loss, and weight gain. The purpose of this lesson is to reinforce (in a group setting) the key concepts around what patients are recommended to eat and how to apply these guidelines when away from home by planning and selecting Pritikin-friendly options. Patients will understand how calorie  density may be adjusted for different weight management goals.  Mindful Eating  Clinical staff led group instruction and group discussion with PowerPoint presentation and patient guidebook. To enhance the learning environment the use of posters, models and videos may be added. Patients will briefly review the concepts of the Pritikin Eating Plan and the importance of low-calorie dense foods. The concept of mindful eating will be introduced as well as the importance of paying attention to internal hunger signals. Triggers for non-hunger eating and techniques for dealing with triggers will be explored. The purpose of this lesson is to provide patients with the opportunity to review the basic principles of the Pritikin Eating Plan, discuss the value of eating mindfully and how to measure internal cues of hunger and fullness using the Hunger Scale. Patients will also discuss reasons for non-hunger eating and learn strategies to use for controlling emotional eating.  Targeting Your Nutrition Priorities Clinical staff led group instruction and group discussion with PowerPoint presentation and patient guidebook. To enhance the learning environment the use of posters, models and videos may be added. Patients will learn how to determine their genetic susceptibility to disease by reviewing their family history. Patients will gain insight into the importance of diet as part of an overall healthy lifestyle in mitigating the impact of genetics and other environmental insults. The purpose of this lesson is to provide patients with the opportunity to assess their personal nutrition priorities by looking at their family history, their own health history and current risk factors. Patients will also be able to discuss ways of prioritizing and modifying the Pritikin Eating Plan for their highest risk areas  Menu  Clinical staff led group instruction and group discussion with PowerPoint presentation and patient guidebook. To  enhance the learning environment the use of posters, models and videos may be added. Using menus brought in from [employer], or printed from [employer], patients will apply the Pritikin dining out guidelines that were presented in the [employer] video. Patients will also be able to practice  these guidelines in a variety of provided scenarios. The purpose of this lesson is to provide patients with the opportunity to practice hands-on learning of the Pritikin Dining Out guidelines with actual menus and practice scenarios.  Label Reading Clinical staff led group instruction and group discussion with PowerPoint presentation and patient guidebook. To enhance the learning environment the use of posters, models and videos may be added. Patients will review and discuss the Pritikin label reading guidelines presented in Pritikin's Label Reading Educational series video. Using fool labels brought in from local grocery stores and markets, patients will apply the label reading guidelines and determine if the packaged food meet the Pritikin guidelines. The purpose of this lesson is to provide patients with the opportunity to review, discuss, and practice hands-on learning of the Pritikin Label Reading guidelines with actual packaged food labels. Cooking School  Pritikin's [employer] are designed to teach patients ways to prepare quick, simple, and affordable recipes at home. The importance of nutrition's role in chronic disease risk reduction is reflected in its emphasis in the overall Pritikin program. By learning how to prepare essential core Pritikin Eating Plan recipes, patients will increase control over what they eat; be able to customize the flavor of foods without the use of added salt, sugar, or fat; and improve the quality of the food they consume. By learning a set of core recipes which are easily assembled, quickly prepared, and affordable, patients are more likely to  prepare more healthy foods at home. These workshops focus on convenient breakfasts, simple entres, side dishes, and desserts which can be prepared with minimal effort and are consistent with nutrition recommendations for cardiovascular risk reduction. Cooking [employer] are taught by a Armed forces logistics/support/administrative officer (RD) who has been trained by the [employer]. The chef or RD has a clear understanding of the importance of minimizing - if not completely eliminating - added fat, sugar, and sodium in recipes. Throughout the series of Cooking School Workshop sessions, patients will learn about healthy ingredients and efficient methods of cooking to build confidence in their capability to prepare    Cooking School weekly topics:  Adding Flavor- Sodium-Free  Fast and Healthy Breakfasts  Powerhouse Plant-Based Proteins  Satisfying Salads and Dressings  Simple Sides and Sauces  International Cuisine-Spotlight on the [employer] Zones  Delicious Desserts  Savory Soups  [employer] - Meals in a Astronomer Appetizers and Snacks  Comforting Weekend Breakfasts  One-Pot Wonders   Fast Evening Meals  Landscape architect Your Pritikin Plate  WORKSHOPS   Healthy Mindset (Psychosocial):  Focused Goals, Sustainable Changes Clinical staff led group instruction and group discussion with PowerPoint presentation and patient guidebook. To enhance the learning environment the use of posters, models and videos may be added. Patients will be able to apply effective goal setting strategies to establish at least one personal goal, and then take consistent, meaningful action toward that goal. They will learn to identify common barriers to achieving personal goals and develop strategies to overcome them. Patients will also gain an understanding of how our mind-set can impact our ability to achieve goals and the importance of cultivating a positive and growth-oriented mind-set. The purpose  of this lesson is to provide patients with a deeper understanding of how to set and achieve personal goals, as well as the tools and strategies needed to overcome common obstacles which may arise along the way.  From Head to Heart: The Power of a Healthy Outlook  Clinical staff led group instruction and group discussion with PowerPoint presentation and patient guidebook. To enhance the learning environment the use of posters, models and videos may be added. Patients will be able to recognize and describe the impact of emotions and mood on physical health. They will discover the importance of self-care and explore self-care practices which may work for them. Patients will also learn how to utilize the 4 C's to cultivate a healthier outlook and better manage stress and challenges. The purpose of this lesson is to demonstrate to patients how a healthy outlook is an essential part of maintaining good health, especially as they continue their cardiac rehab journey.  Healthy Sleep for a Healthy Heart Clinical staff led group instruction and group discussion with PowerPoint presentation and patient guidebook. To enhance the learning environment the use of posters, models and videos may be added. At the conclusion of this workshop, patients will be able to demonstrate knowledge of the importance of sleep to overall health, well-being, and quality of life. They will understand the symptoms of, and treatments for, common sleep disorders. Patients will also be able to identify daytime and nighttime behaviors which impact sleep, and they will be able to apply these tools to help manage sleep-related challenges. The purpose of this lesson is to provide patients with a general overview of sleep and outline the importance of quality sleep. Patients will learn about a few of the most common sleep disorders. Patients will also be introduced to the concept of "sleep hygiene," and discover ways to self-manage certain sleeping  problems through simple daily behavior changes. Finally, the workshop will motivate patients by clarifying the links between quality sleep and their goals of heart-healthy living.   Recognizing and Reducing Stress Clinical staff led group instruction and group discussion with PowerPoint presentation and patient guidebook. To enhance the learning environment the use of posters, models and videos may be added. At the conclusion of this workshop, patients will be able to understand the types of stress reactions, differentiate between acute and chronic stress, and recognize the impact that chronic stress has on their health. They will also be able to apply different coping mechanisms, such as reframing negative self-talk. Patients will have the opportunity to practice a variety of stress management techniques, such as deep abdominal breathing, progressive muscle relaxation, and/or guided imagery.  The purpose of this lesson is to educate patients on the role of stress in their lives and to provide healthy techniques for coping with it.  Learning Barriers/Preferences:  Learning Barriers/Preferences - 03/04/23 1219       Learning Barriers/Preferences   Learning Barriers Sight;Hearing   wears glasses, has hearing aides both ears   Learning Preferences Pictoral;Skilled Demonstration;Written Material             Education Topics:  Knowledge Questionnaire Score:  Knowledge Questionnaire Score - 03/04/23 1344       Knowledge Questionnaire Score   Pre Score 21/24             Core Components/Risk Factors/Patient Goals at Admission:  Personal Goals and Risk Factors at Admission - 03/04/23 0920       Core Components/Risk Factors/Patient Goals on Admission    Weight Management Obesity;Yes    Intervention Weight Management/Obesity: Establish reasonable short term and long term weight goals.;Obesity: Provide education and appropriate resources to help participant work on and attain dietary  goals.    Admit Weight 273 lb 9.5 oz (124.1 kg)    Expected Outcomes Short Term:  Continue to assess and modify interventions until short term weight is achieved;Long Term: Adherence to nutrition and physical activity/exercise program aimed toward attainment of established weight goal;Weight Loss: Understanding of general recommendations for a balanced deficit meal plan, which promotes 1-2 lb weight loss per week and includes a negative energy balance of [phone] kcal/d    Hypertension Yes    Intervention Provide education on lifestyle modifcations including regular physical activity/exercise, weight management, moderate sodium restriction and increased consumption of fresh fruit, vegetables, and low fat dairy, alcohol moderation, and smoking cessation.;Monitor prescription use compliance.    Expected Outcomes Short Term: Continued assessment and intervention until BP is < 140/36mm HG in hypertensive participants. < 130/80mm HG in hypertensive participants with diabetes, heart failure or chronic kidney disease.;Long Term: Maintenance of blood pressure at goal levels.    Lipids Yes    Intervention Provide education and support for participant on nutrition & aerobic/resistive exercise along with prescribed medications to achieve LDL 70mg , HDL >40mg .    Expected Outcomes Short Term: Participant states understanding of desired cholesterol values and is compliant with medications prescribed. Participant is following exercise prescription and nutrition guidelines.;Long Term: Cholesterol controlled with medications as prescribed, with individualized exercise RX and with personalized nutrition plan. Value goals: LDL < 70mg , HDL > 40 mg.             Core Components/Risk Factors/Patient Goals Review:    Core Components/Risk Factors/Patient Goals at Discharge (Final Review):    ITP Comments:  ITP Comments     Row Name 03/04/23 0908           ITP Comments Medical Director- Dr. Armanda Magic, MD.  Patient oriented to [facility] / Intensive Cardiac Rehab Program. Initial orientation packet reviewed with patient.                Comments: Participant attended orientation for the cardiac rehabilitation program on  03/04/2023  to perform initial intake and exercise walk test. Patient introduced to the [facility] education and orientation packet was reviewed. Completed 6-minute walk test, measurements, initial ITP, and exercise prescription. Vital signs stable. Telemetry-normal sinus rhythm with frequent, bigeminal PVCs, asymptomatic.   Service time was from 908 to 1102.

## 2023-03-04 NOTE — Progress Notes (Signed)
Cardiac Rehab Medication Review by a Nurse   Does the patient  feel that his/her medications are working for him/her?  Yes.   Has the patient been experiencing any side effects to the medications prescribed?  No.   Does the patient measure his/her own blood pressure or blood glucose at home?  Yes.   Does the patient have any problems obtaining medications due to transportation or finances?  No.   Understanding of regimen: excellent Understanding of indications: excellent Potential of compliance: excellent      Nurse comments: patient stated he monitors BP for his PRN apresoline. He has not needed it in awhile, and has not needed nitrostat for a while.

## 2023-03-04 NOTE — Progress Notes (Signed)
Frequent intermittent bigeminal PVC's noted. Medications reviewed. Taking as prescribed. Blood pressure 102/52 hear rate 78. Telemetry rhythm Sinus with t wave inversion. Today's ECG tracings shown to onsite provider, Carlos Levering NP. Deborah reviewed today's ECG tracings and said that Steven Munoz is okay to proceed with orientation and exercise as the patient has a history of PVC's.Will continue to monitor the patient throughout  the program. Steven Headings RN BSN

## 2023-03-08 ENCOUNTER — Other Ambulatory Visit: Payer: Self-pay | Admitting: Internal Medicine

## 2023-03-10 ENCOUNTER — Encounter (HOSPITAL_COMMUNITY)
Admission: RE | Admit: 2023-03-10 | Discharge: 2023-03-10 | Disposition: A | Discharge status: Home / Self Care | Payer: Medicare HMO | Source: Ambulatory Visit | Attending: Cardiovascular Disease | Admitting: Cardiovascular Disease

## 2023-03-10 DIAGNOSIS — I213 ST elevation (STEMI) myocardial infarction of unspecified site: Secondary | ICD-10-CM | POA: Diagnosis not present

## 2023-03-10 NOTE — Progress Notes (Signed)
Daily Session Note  Patient Details  Name: Steven Munoz MRN: [id number] Date of Birth: June 10, 1941 Referring Provider:   Flowsheet Row INTENSIVE CARDIAC REHAB ORIENT from 03/04/2023 in [facility]  Referring Provider Wendall Stade, MD       Encounter Date: 03/10/2023  Check In:  Session Check In - 03/10/23 1006       Check-In   Supervising physician immediately available to respond to emergencies [facility] MD immediately available    Physician(s) Jari Favre PA    Location [facility]    Staff Present Velora Mediate, RN, MSN;Olinty Peggye Pitt, MS, ACSM-CEP, Exercise Physiologist;Azriella Mattia, RN, Cathlean Cower, MS, Exercise Physiologist;David Manus Gunning, MS, ACSM-CEP, CCRP, Exercise Physiologist;Johnny Hale Bogus, MS, Exercise Physiologist;Jetta Walker BS, ACSM-CEP, Exercise Physiologist    Virtual Visit No    Medication changes reported     No    Fall or balance concerns reported    No    Tobacco Cessation No Change    Warm-up and Cool-down Performed as group-led instruction   Cardiac Rehab Orientation   Resistance Training Performed No    VAD Patient? No    PAD/SET Patient? No      Pain Assessment   Currently in Pain? No/denies    Pain Score 0-No pain    Multiple Pain Sites No             Capillary Blood Glucose: No results found for this or any previous visit (from the past 24 hour(s)).   Exercise Prescription Changes - 03/10/23 1027       Response to Exercise   Blood Pressure (Admit) 118/70    Blood Pressure (Exercise) 128/60    Blood Pressure (Exit) 110/70    Heart Rate (Admit) 85 bpm    Heart Rate (Exercise) 94 bpm    Heart Rate (Exit) 59 bpm    Rating of Perceived Exertion (Exercise) 9    Symptoms None    Comments Off to a great start with exercise.    Duration Continue with 30 min of aerobic exercise without signs/symptoms of physical distress.    Intensity THRR unchanged       Progression   Progression Continue to progress workloads to maintain intensity without signs/symptoms of physical distress.    Average METs 2.1      Resistance Training   Training Prescription Yes    Weight 3 lbs    Reps 10-15    Time 10 Minutes      Interval Training   Interval Training No      Treadmill   MPH 1.7    Grade 0    Minutes 15    METs 2.3      T5 Nustep   Level 3    SPM 96    Minutes 15    METs 2             Social History   Tobacco Use  Smoking Status Former   Packs/day: 1.00   Years: 6.00   Additional pack years: 0.00   Total pack years: 6.00   Types: Cigarettes   Quit date: 10/28/1966   Years since quitting: 56.4  Smokeless Tobacco Never  Tobacco Comments   smoked 1962- 1968, up to < 1 ppd    Goals Met:  Exercise tolerated well No report of concerns or symptoms today Strength training completed today  Goals Unmet:  Not Applicable  Comments: Pt started cardiac rehab today.  Pt tolerated light exercise without difficulty. VSS, telemetry-Sinus rhythm with frequent PVC's,bigeminy,trigeminy this has been previously documented asymptomatic.  Medication list reconciled. Pt denies barriers to medicaiton compliance.  PSYCHOSOCIAL ASSESSMENT:  PHQ-0. Pt exhibits positive coping skills, hopeful outlook with supportive family. No psychosocial needs identified at this time, no psychosocial interventions necessary.    Pt enjoyed driving sports cars, retired now.   Pt oriented to exercise equipment and routine.    Understanding verbalized. Thayer Headings RN BSN    Dr. Armanda Magic is Medical Director for Cardiac Rehab at [facility].

## 2023-03-12 ENCOUNTER — Encounter (HOSPITAL_COMMUNITY)
Admission: RE | Admit: 2023-03-12 | Discharge: 2023-03-12 | Disposition: A | Discharge status: Home / Self Care | Payer: Medicare HMO | Source: Ambulatory Visit | Attending: Cardiovascular Disease | Admitting: Cardiovascular Disease

## 2023-03-12 DIAGNOSIS — I213 ST elevation (STEMI) myocardial infarction of unspecified site: Secondary | ICD-10-CM | POA: Diagnosis not present

## 2023-03-14 ENCOUNTER — Other Ambulatory Visit: Payer: Self-pay | Admitting: Internal Medicine

## 2023-03-14 ENCOUNTER — Encounter (HOSPITAL_COMMUNITY)
Admission: RE | Admit: 2023-03-14 | Discharge: 2023-03-14 | Disposition: A | Discharge status: Home / Self Care | Payer: Medicare HMO | Source: Ambulatory Visit | Attending: Cardiovascular Disease | Admitting: Cardiovascular Disease

## 2023-03-14 DIAGNOSIS — Z8739 Personal history of other diseases of the musculoskeletal system and connective tissue: Secondary | ICD-10-CM

## 2023-03-14 DIAGNOSIS — I213 ST elevation (STEMI) myocardial infarction of unspecified site: Secondary | ICD-10-CM

## 2023-03-14 DIAGNOSIS — M109 Gout, unspecified: Secondary | ICD-10-CM

## 2023-03-14 NOTE — Telephone Encounter (Signed)
Last Fill: 11/04/2022  Labs: 02/06/2023 RBC 3.68, Hgb 12.0, Hct 35.4, Glucose 104, Uric Acid 11/25/2022 5.1  Next Visit: 11/26/2023  Last Visit: 11/25/2022  DX: History of gout   Current Dose per office note 11/25/2022: allopurinol 100 mg daily   Okay to refill Allopurinol?

## 2023-03-17 ENCOUNTER — Encounter (HOSPITAL_COMMUNITY)
Admission: RE | Admit: 2023-03-17 | Discharge: 2023-03-17 | Disposition: A | Discharge status: Home / Self Care | Payer: Medicare HMO | Source: Ambulatory Visit | Attending: Cardiovascular Disease | Admitting: Cardiovascular Disease

## 2023-03-17 DIAGNOSIS — I213 ST elevation (STEMI) myocardial infarction of unspecified site: Secondary | ICD-10-CM | POA: Diagnosis not present

## 2023-03-18 ENCOUNTER — Encounter: Payer: Self-pay | Admitting: Adult Health

## 2023-03-18 ENCOUNTER — Ambulatory Visit: Payer: Medicare HMO | Admitting: Adult Health

## 2023-03-18 ENCOUNTER — Other Ambulatory Visit: Payer: Self-pay | Admitting: Internal Medicine

## 2023-03-18 VITALS — BP 114/60 | HR 67 | Ht 72.25 in | Wt 267.8 lb

## 2023-03-18 DIAGNOSIS — E669 Obesity, unspecified: Secondary | ICD-10-CM | POA: Diagnosis not present

## 2023-03-18 DIAGNOSIS — G4733 Obstructive sleep apnea (adult) (pediatric): Secondary | ICD-10-CM | POA: Diagnosis not present

## 2023-03-18 NOTE — Patient Instructions (Signed)
Continue on CPAP At bedtime   Keep up good work  Work on healthy weight .  Do not drive if sleepy  Follow up with Dr. Alva  In 1 year and As needed    

## 2023-03-18 NOTE — Assessment & Plan Note (Addendum)
Excellent control compliance on nocturnal CPAP.  No changes  Plan  Patient Instructions  Continue on CPAP At bedtime  .  Keep up good work .  Work on healthy weight .  Do not drive if sleepy .  Follow up with Dr. Vassie Loll  In 1 year and As needed

## 2023-03-18 NOTE — Assessment & Plan Note (Signed)
Work on healthy weight 

## 2023-03-18 NOTE — Progress Notes (Signed)
@Patient  ID: Steven Munoz, male    DOB: November 18, 1940, 82 y.o.   MRN: [id number]  Chief Complaint  Patient presents with   Follow-up    Referring provider: Wanda Plump, MD  HPI: 82 year old male followed for obstructive sleep apnea Medical history significant for coronary artery disease status post CABG , Cardiomyopathy, Diastolic CHF   TEST/EVENTS :  PSG (289 Lbs) 2009 -  severe OSA with predominant hypopneas, AHI 31/h corrected by CPAP +8 with large quattro mask . PLms  improved with titration   03/18/2023 Follow up; OSA  Patient presents for a 1 year follow-up.  Patient has underlying severe obstructive sleep apnea.  He is maintained on nocturnal CPAP.  Patient says he wears a CPAP every single night.  Feels that he benefits from CPAP with decreased daytime sleepiness.  Says he cannot sleep without it.  CPAP download shows excellent compliance with 100% usage.  Daily average usage at 8 hours..  Patient is on CPAP 8 cm H2O.  AHI 1.9/hour.  Minimum leaks. Got new CPAP 2023. Uses full face mask.    Was admitted in late March early April of this year for a STEMI, underwent cardiac cath that showed thrombotic 90% occlusion and anastomosis of the SVG-PDA -treated with Aggrastat without PCI.  Discharged on aspirin and Plavix for 1 year.  2D echo showed EF at 40 to 45%, global hypokinesis and grade 1 diastolic dysfunction.  Being managed by cardiology.  Is in cardiac rehab. Says he is doing well.    Allergies  Allergen Reactions   Povidone-Iodine Rash   Ofloxacin Other (See Comments)    Affected sleep with eye drop formulation   Tape Rash and Other (See Comments)    Skin tears easily Cloth adhesive tape-per patient    Immunization History  Administered Date(s) Administered   COVID-19, mRNA, vaccine(Comirnaty)12 years and older 08/23/2022   Fluad Quad(high Dose 65+) 07/06/2019, 07/04/2022   Influenza Split 08/21/2011, 08/26/2012   Influenza Whole 07/29/2008, 08/10/2009, 08/28/2010    Influenza, High Dose Seasonal PF 08/13/2016, 08/12/2017, 08/26/2018, 07/04/2020, 07/24/2021   Influenza,inj,Quad PF,6+ Mos 08/31/2013, 08/16/2014, 07/25/2015   PFIZER Comirnaty(Gray Top)Covid-19 Tri-Sucrose Vaccine 03/22/2021   PFIZER(Purple Top)SARS-COV-2 Vaccination 11/10/2019, 11/30/2019, 08/26/2020   PNEUMOCOCCAL CONJUGATE-20 11/05/2021   Pfizer Covid-19 Vaccine Bivalent Booster 62yrs & up 07/20/2021   Pneumococcal Conjugate-13 05/09/2015   Pneumococcal Polysaccharide-23 05/27/2011   Td 05/18/2010   Tdap 07/04/2020   Zoster Recombinat (Shingrix) 07/04/2022, 09/09/2022   Zoster, Live 06/06/2016    Past Medical History:  Diagnosis Date   CAD (coronary artery disease) CABG  05/29/09    OPERATIVE PROCEDURES:  Median sternotomy, extracorporeal circulation,    Colonic polyp 2002, 2005, 2009   Dr Kinnie Scales   Dermatophytosis of nail    Diverticulosis    Dysmetabolic syndrome    GERD (gastroesophageal reflux disease)    S/P dilation  2005   Gout    Hyperlipidemia    Hypertension    Hypothyroidism    Murmur    Nephrolithiasis    X 1   Peripheral neuropathy 03/20/2021   Pneumonia age 30   treated as inpatient   Sleep apnea    on CPAP; Dr Vassie Loll    Tobacco History: Social History   Tobacco Use  Smoking Status Former   Packs/day: 1.00   Years: 6.00   Additional pack years: 0.00   Total pack years: 6.00   Types: Cigarettes   Quit date: 10/28/1966   Years since quitting: 56.4  Smokeless Tobacco Never  Tobacco Comments   smoked 1962- 1968, up to < 1 ppd   Counseling given: Not Answered Tobacco comments: smoked 1962- 1968, up to < 1 ppd   Outpatient Medications Prior to Visit  Medication Sig Dispense Refill   allopurinol (ZYLOPRIM) 100 MG tablet TAKE 1 TABLET BY MOUTH EVERY DAY 90 tablet 3   aspirin EC 81 MG tablet Take 81 mg by mouth daily.     clopidogrel (PLAVIX) 75 MG tablet Take 1 tablet (75 mg total) by mouth daily with breakfast. 90 tablet 2   Coenzyme Q10 (COQ10 PO)  Take 1 capsule by mouth daily.     colestipol (COLESTID) 1 g tablet Take 1 g by mouth daily.     Cyanocobalamin (VITAMIN B-12 PO) Take 1 tablet by mouth daily.     dapagliflozin propanediol (FARXIGA) 10 MG TABS tablet Take 1 tablet (10 mg total) by mouth daily. 90 tablet 3   famotidine (PEPCID) 40 MG tablet Take 40 mg by mouth at bedtime.     fluorouracil (EFUDEX) 5 % cream Apply 1 application  topically daily as needed (facial rash).     furosemide (LASIX) 20 MG tablet Take 1 tablet (20 mg total) by mouth daily as needed. 30 tablet 1   hydrALAZINE (APRESOLINE) 10 MG tablet Take 1 tablet (10 mg total) by mouth 2 (two) times daily as needed. Take one tablet as needed for systolic BP >150 60 tablet 0   levothyroxine (SYNTHROID) 125 MCG tablet Take 1 tablet (125 mcg total) by mouth daily before breakfast. 90 tablet 1   magnesium oxide (MAG-OX) 400 (240 Mg) MG tablet Take 400 mg by mouth at bedtime.     metoprolol succinate (TOPROL-XL) 25 MG 24 hr tablet Take 1 tablet (25 mg total) by mouth daily. 90 tablet 1   Multiple Vitamins-Minerals (PRESERVISION AREDS 2+MULTI VIT) CAPS Take 1 capsule by mouth 2 (two) times daily.      nitroGLYCERIN (NITROSTAT) 0.4 MG SL tablet Place 1 tablet (0.4 mg total) under the tongue every 5 (five) minutes as needed. 25 tablet 2   Omega-3 Fatty Acids (FISH OIL PO) Take 1 capsule by mouth daily.     rosuvastatin (CRESTOR) 40 MG tablet Take 1 tablet (40 mg total) by mouth daily. 90 tablet 1   sacubitril-valsartan (ENTRESTO) 49-51 MG Take 1 tablet by mouth 2 (two) times daily. 60 tablet 1   spironolactone (ALDACTONE) 25 MG tablet Take 0.5 tablets (12.5 mg total) by mouth daily. 45 tablet 3   No facility-administered medications prior to visit.     Review of Systems:   Constitutional:   No  weight loss, night sweats,  Fevers, chills, fatigue, or  lassitude.  HEENT:   No headaches,  Difficulty swallowing,  Tooth/dental problems, or  Sore throat,                No  sneezing, itching, ear ache, nasal congestion, post nasal drip,   CV:  No chest pain,  Orthopnea, PND, swelling in lower extremities, anasarca, dizziness, palpitations, syncope.   GI  No heartburn, indigestion, abdominal pain, nausea, vomiting, diarrhea, change in bowel habits, loss of appetite, bloody stools.   Resp: No shortness of breath with exertion or at rest.  No excess mucus, no productive cough,  No non-productive cough,  No coughing up of blood.  No change in color of mucus.  No wheezing.  No chest wall deformity  Skin: no rash or lesions.  GU: no dysuria,  change in color of urine, no urgency or frequency.  No flank pain, no hematuria   MS:  No joint pain or swelling.  No decreased range of motion.  No back pain.    Physical Exam  BP 114/60 (BP Location: Left Arm, Patient Position: Sitting, Cuff Size: Large)   Pulse 67   Ht 6' 0.25" (1.835 m)   Wt 267 lb 12.8 oz (121.5 kg)   SpO2 97%   BMI 36.07 kg/m   GEN: A/Ox3; pleasant , NAD, well nourished    HEENT:  [place]/AT,  EACs-clear, TMs-wnl, NOSE-clear, THROAT-clear, no lesions, no postnasal drip or exudate noted. Class 2-3 MP airway   NECK:  Supple w/ fair ROM; no JVD; normal carotid impulses w/o bruits; no thyromegaly or nodules palpated; no lymphadenopathy.    RESP  Clear  P & A; w/o, wheezes/ rales/ or rhonchi. no accessory muscle use, no dullness to percussion  CARD:  RRR, Gr 1 SM , tr peripheral edema, pulses intact, no cyanosis or clubbing.  GI:   Soft & nt; nml bowel sounds; no organomegaly or masses detected.   Musco: Warm bil, no deformities or joint swelling noted.   Neuro: alert, no focal deficits noted.    Skin: Warm, no lesions or rashes    Lab Results:  CBC   BMET     Imaging: No results found.        No data to display          No results found for: "NITRICOXIDE"      Assessment & Plan:   OSA on CPAP Excellent control compliance on nocturnal CPAP.  No changes  Plan   Patient Instructions  Continue on CPAP At bedtime  .  Keep up good work .  Work on healthy weight .  Do not drive if sleepy .  Follow up with Dr. Vassie Loll  In 1 year and As needed      Obesity (BMI 30-39.9) Work on healthy weight.      Rubye Oaks, NP 03/18/2023

## 2023-03-19 ENCOUNTER — Encounter (HOSPITAL_COMMUNITY)
Admission: RE | Admit: 2023-03-19 | Discharge: 2023-03-19 | Disposition: A | Discharge status: Home / Self Care | Payer: Medicare HMO | Source: Ambulatory Visit | Attending: Cardiovascular Disease | Admitting: Cardiovascular Disease

## 2023-03-19 DIAGNOSIS — I213 ST elevation (STEMI) myocardial infarction of unspecified site: Secondary | ICD-10-CM | POA: Diagnosis not present

## 2023-03-21 ENCOUNTER — Encounter (HOSPITAL_COMMUNITY)
Admission: RE | Admit: 2023-03-21 | Discharge: 2023-03-21 | Disposition: A | Discharge status: Home / Self Care | Payer: Medicare HMO | Source: Ambulatory Visit | Attending: Cardiovascular Disease | Admitting: Cardiovascular Disease

## 2023-03-21 DIAGNOSIS — I213 ST elevation (STEMI) myocardial infarction of unspecified site: Secondary | ICD-10-CM

## 2023-03-26 ENCOUNTER — Encounter (HOSPITAL_COMMUNITY)
Admission: RE | Admit: 2023-03-26 | Discharge: 2023-03-26 | Disposition: A | Discharge status: Home / Self Care | Payer: Medicare HMO | Source: Ambulatory Visit | Attending: Cardiovascular Disease | Admitting: Cardiovascular Disease

## 2023-03-26 DIAGNOSIS — I213 ST elevation (STEMI) myocardial infarction of unspecified site: Secondary | ICD-10-CM | POA: Diagnosis not present

## 2023-03-28 ENCOUNTER — Encounter (HOSPITAL_COMMUNITY)
Admission: RE | Admit: 2023-03-28 | Discharge: 2023-03-28 | Disposition: A | Discharge status: Home / Self Care | Payer: Medicare HMO | Source: Ambulatory Visit | Attending: Cardiovascular Disease | Admitting: Cardiovascular Disease

## 2023-03-28 DIAGNOSIS — I213 ST elevation (STEMI) myocardial infarction of unspecified site: Secondary | ICD-10-CM | POA: Diagnosis not present

## 2023-03-29 IMAGING — DX DG ANKLE COMPLETE 3+V*R*
3 series · 3 of 3 positions shown · non-contrast
Comparison: None.

CLINICAL DATA: Gout

EXAM:
RIGHT ANKLE - COMPLETE 3+ VIEW

[ankle ap]
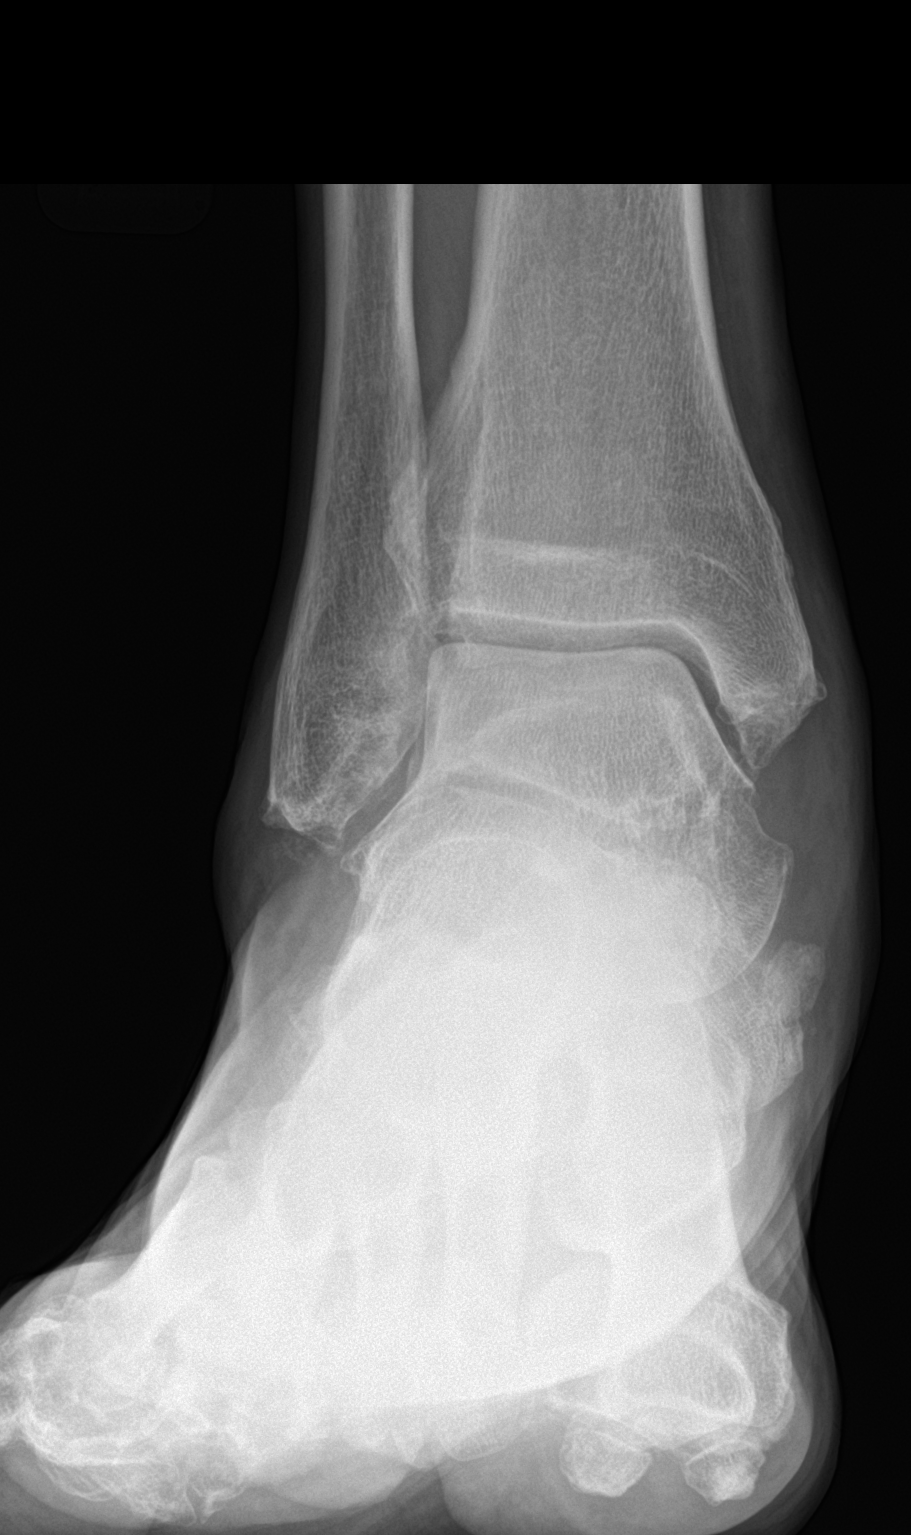

[ankle obl]
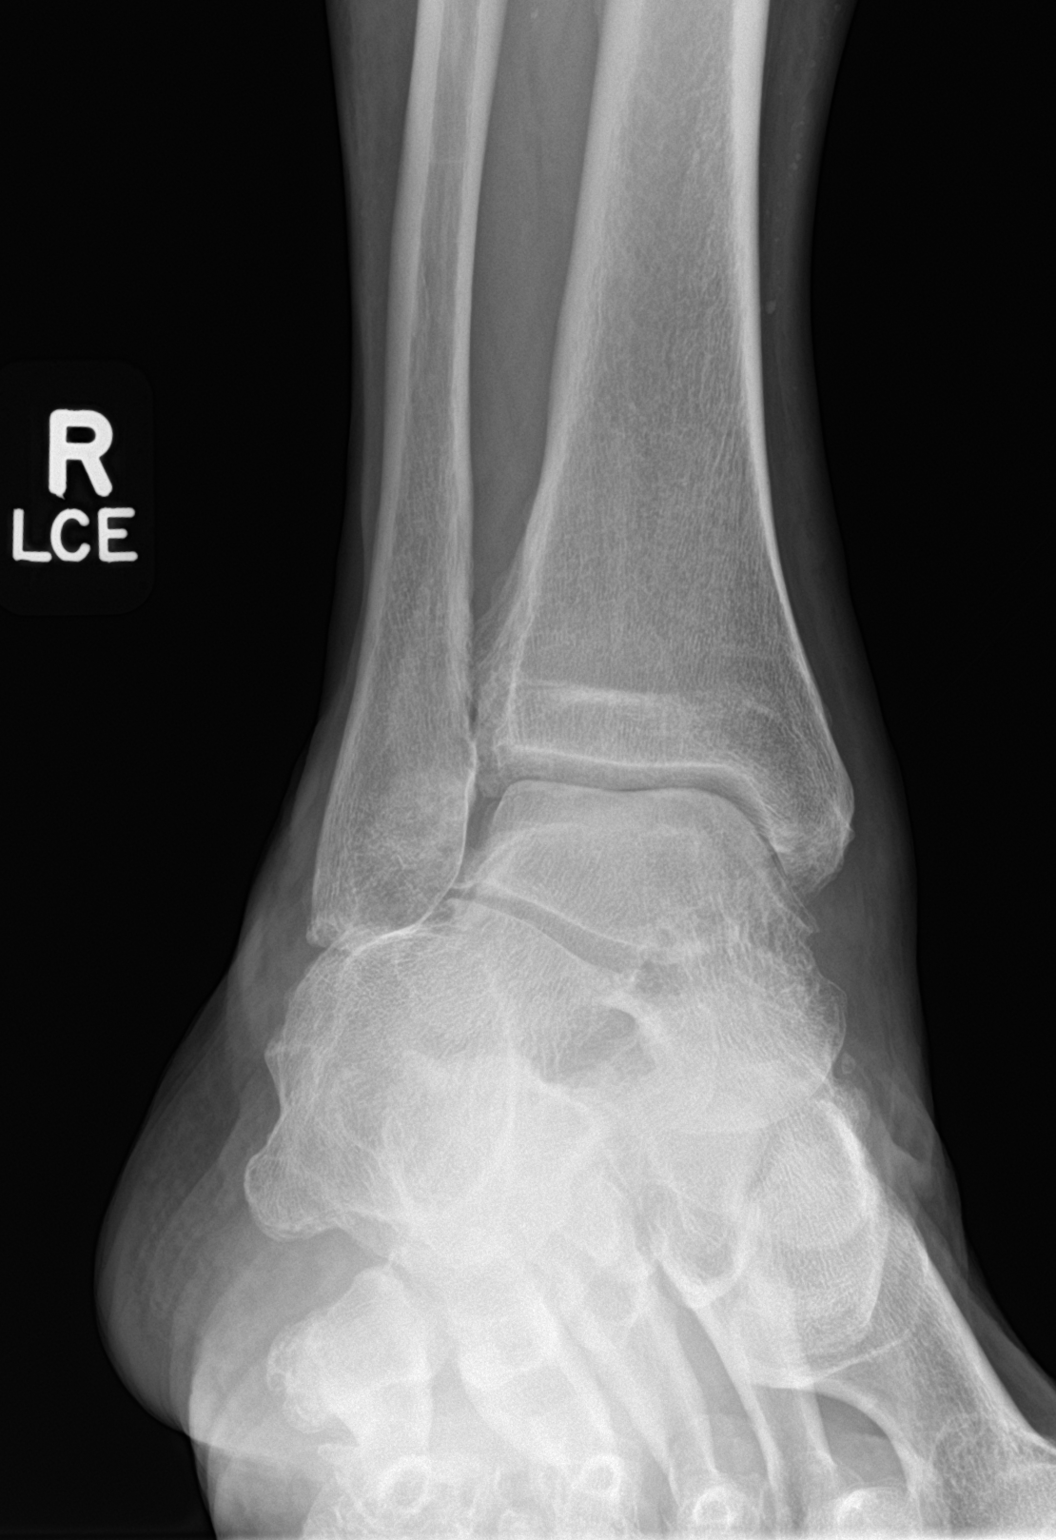

[ankle lat]
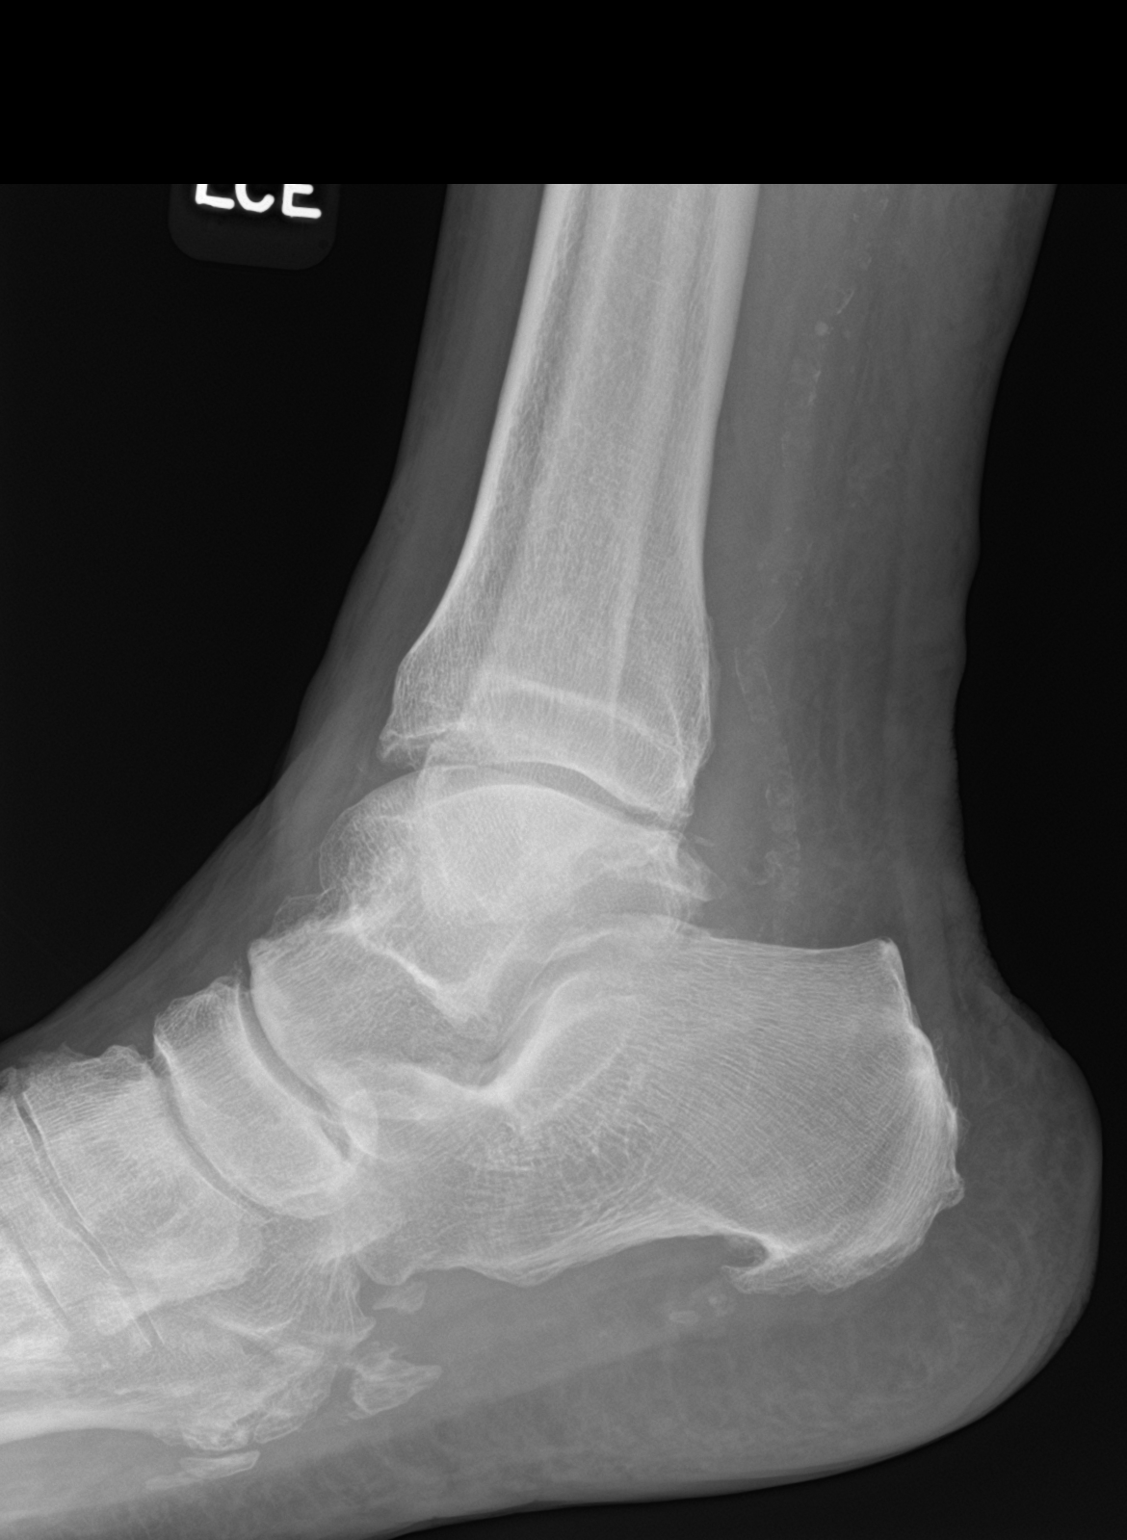

[3 of 3 positions shown; findings below may reference images not displayed]

FINDINGS: Alignment is anatomic. No acute fracture. Mild degenerative changes
at the tibiotalar joint and midfoot. Plantar calcaneal spur. No
erosive changes. Vascular calcifications.
IMPRESSION: Mild degenerative changes at the hindfoot and midfoot.

## 2023-03-31 ENCOUNTER — Encounter (HOSPITAL_COMMUNITY)
Admission: RE | Admit: 2023-03-31 | Discharge: 2023-03-31 | Disposition: A | Discharge status: Home / Self Care | Payer: Medicare HMO | Source: Ambulatory Visit | Attending: Cardiovascular Disease | Admitting: Cardiovascular Disease

## 2023-03-31 DIAGNOSIS — I213 ST elevation (STEMI) myocardial infarction of unspecified site: Secondary | ICD-10-CM | POA: Insufficient documentation

## 2023-04-01 NOTE — Progress Notes (Signed)
Cardiac Individual Treatment Plan  Patient Details  Name: Steven Munoz MRN: [id number] Date of Birth: 07-15-1941 Referring Provider:   Flowsheet Row INTENSIVE CARDIAC REHAB ORIENT from 03/04/2023 in [facility] for Heart, Vascular, & Lung [facility]  Referring Provider Wendall Stade, MD       Initial Encounter Date:  Flowsheet Row INTENSIVE CARDIAC REHAB ORIENT from 03/04/2023 in [facility] for Heart, Vascular, & Lung Health  Date 03/04/23       Visit Diagnosis: 01/25/23 STEMI  Patient's Home Medications on Admission:  Current Outpatient Medications:    allopurinol (ZYLOPRIM) 100 MG tablet, TAKE 1 TABLET BY MOUTH EVERY DAY, Disp: 90 tablet, Rfl: 3   aspirin EC 81 MG tablet, Take 81 mg by mouth daily., Disp: , Rfl:    clopidogrel (PLAVIX) 75 MG tablet, Take 1 tablet (75 mg total) by mouth daily with breakfast., Disp: 90 tablet, Rfl: 2   Coenzyme Q10 (COQ10 PO), Take 1 capsule by mouth daily., Disp: , Rfl:    colestipol (COLESTID) 1 g tablet, Take 1 g by mouth daily., Disp: , Rfl:    Cyanocobalamin (VITAMIN B-12 PO), Take 1 tablet by mouth daily., Disp: , Rfl:    dapagliflozin propanediol (FARXIGA) 10 MG TABS tablet, Take 1 tablet (10 mg total) by mouth daily., Disp: 90 tablet, Rfl: 3   famotidine (PEPCID) 40 MG tablet, Take 40 mg by mouth at bedtime., Disp: , Rfl:    fluorouracil (EFUDEX) 5 % cream, Apply 1 application  topically daily as needed (facial rash)., Disp: , Rfl:    furosemide (LASIX) 20 MG tablet, Take 1 tablet (20 mg total) by mouth daily as needed., Disp: 30 tablet, Rfl: 1   hydrALAZINE (APRESOLINE) 10 MG tablet, Take 1 tablet (10 mg total) by mouth 2 (two) times daily as needed. Take one tablet as needed for systolic BP >150, Disp: 60 tablet, Rfl: 0   levothyroxine (SYNTHROID) 125 MCG tablet, Take 1 tablet (125 mcg total) by mouth daily before breakfast., Disp: 90 tablet, Rfl: 1   magnesium oxide (MAG-OX) 400 (240 Mg) MG  tablet, Take 400 mg by mouth at bedtime., Disp: , Rfl:    metoprolol succinate (TOPROL-XL) 25 MG 24 hr tablet, Take 1 tablet (25 mg total) by mouth daily., Disp: 90 tablet, Rfl: 1   Multiple Vitamins-Minerals (PRESERVISION AREDS 2+MULTI VIT) CAPS, Take 1 capsule by mouth 2 (two) times daily. , Disp: , Rfl:    nitroGLYCERIN (NITROSTAT) 0.4 MG SL tablet, Place 1 tablet (0.4 mg total) under the tongue every 5 (five) minutes as needed., Disp: 25 tablet, Rfl: 2   Omega-3 Fatty Acids (FISH OIL PO), Take 1 capsule by mouth daily., Disp: , Rfl:    rosuvastatin (CRESTOR) 40 MG tablet, Take 1 tablet (40 mg total) by mouth daily., Disp: 90 tablet, Rfl: 1   sacubitril-valsartan (ENTRESTO) 49-51 MG, Take 1 tablet by mouth 2 (two) times daily., Disp: 60 tablet, Rfl: 1   spironolactone (ALDACTONE) 25 MG tablet, Take 0.5 tablets (12.5 mg total) by mouth daily., Disp: 45 tablet, Rfl: 3  Past Medical History: Past Medical History:  Diagnosis Date   CAD (coronary artery disease) CABG  05/29/09    OPERATIVE PROCEDURES:  Median sternotomy, extracorporeal circulation,    Colonic polyp 2002, 2005, 2009   Dr Kinnie Scales   Dermatophytosis of nail    Diverticulosis    Dysmetabolic syndrome    GERD (gastroesophageal reflux disease)    S/P dilation  2005  Gout    Hyperlipidemia    Hypertension    Hypothyroidism    Murmur    Nephrolithiasis    X 1   Peripheral neuropathy 03/20/2021   Pneumonia age 59   treated as inpatient   Sleep apnea    on CPAP; Dr Vassie Loll    Tobacco Use: Social History   Tobacco Use  Smoking Status Former   Packs/day: 1.00   Years: 6.00   Additional pack years: 0.00   Total pack years: 6.00   Types: Cigarettes   Quit date: 10/28/1966   Years since quitting: 56.4  Smokeless Tobacco Never  Tobacco Comments   smoked 1962- 1968, up to < 1 ppd    Labs: Review Flowsheet  More data exists      Latest Ref Rng & Units 02/26/2021 07/04/2021 05/09/2022 08/14/2022 01/28/2023  Labs for ITP Cardiac  and Pulmonary Rehab  Cholestrol 0 - 200 mg/dL - 161  - 096  86   LDL (calc) 0 - 99 mg/dL - 57  - 55  35   HDL-C >40 mg/dL - 04.54  - 09.81  37   Trlycerides <150 mg/dL - 191.4  - 782.9  72   Hemoglobin A1c 4.6 - 6.5 % 5.7  - 5.7  - -    Capillary Blood Glucose: Lab Results  Component Value Date   GLUCAP 87 07/12/2009   GLUCAP 94 07/12/2009   GLUCAP 101 (H) 07/10/2009   GLUCAP 101 (H) 07/10/2009   GLUCAP 117 (H) 07/07/2009     Exercise Target Goals: Exercise Program Goal: Individual exercise prescription set using results from initial 6 min walk test and THRR while considering  patient's activity barriers and safety.   Exercise Prescription Goal: Initial exercise prescription builds to 30-45 minutes a day of aerobic activity, 2-3 days per week.  Home exercise guidelines will be given to patient during program as part of exercise prescription that the participant will acknowledge.  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    Cardiac Risk Stratification High             6 Minute Walk:  6 Minute Walk     Row Name 03/04/23 1035         6 Minute Walk   Phase Initial     Distance 1213 feet     Walk Time 6 minutes     # of Rest Breaks 0     MPH 2.3     METS 1.56     RPE 9     Perceived Dyspnea  0     VO2 Peak 5.46     Symptoms Yes (comment)     Comments Bilateral feet pain from neuropathy, which he rates a 1-2 out of 10 on the pain scale and right hip pain, which he also rates a 1-2 out of 10 on the pain scale.     Resting HR 86 bpm     Resting BP 102/52      Resting Oxygen Saturation  98 %     Exercise Oxygen Saturation  during 6 min walk 96 %     Max Ex. HR  93 bpm     Max Ex. BP 152/62     2 Minute Post BP 122/52              Oxygen Initial Assessment:   Oxygen Re-Evaluation:   Oxygen Discharge (Final Oxygen Re-Evaluation):   Initial Exercise Prescription:  Initial Exercise Prescription - 03/04/23 1200       Date of Initial Exercise RX and Referring Provider   Date 03/04/23    Referring Provider Wendall Stade, MD    Expected Discharge Date 05/16/23      Treadmill   MPH 1.7    Grade 0    Minutes 15    METs 2.3      T5 Nustep   Level 3    SPM 85    Minutes 15    METs 2.3      Prescription Details   Frequency (times per week) 3    Duration Progress to 30 minutes of continuous aerobic without signs/symptoms of physical distress      Intensity   THRR 40-80% of Max Heartrate 55-110    Ratings of Perceived Exertion 11-13    Perceived Dyspnea 0-4      Progression   Progression Continue to progress workloads to maintain intensity without signs/symptoms of physical distress.      Resistance Training   Training Prescription Yes    Weight 3 lbs    Reps 10-15             Perform Capillary Blood Glucose checks as needed.  Exercise Prescription Changes:   Exercise Prescription Changes     Row Name 03/10/23 1027 03/31/23 1008           Response to Exercise   Blood Pressure (Admit) 118/70 112/60      Blood Pressure (Exercise) 128/60 118/70      Blood Pressure (Exit) 110/70 96/60      Heart Rate (Admit) 85 bpm 65 bpm      Heart Rate (Exercise) 94 bpm 92 bpm      Heart Rate (Exit) 59 bpm 74 bpm      Rating of Perceived Exertion (Exercise) 9 7      Symptoms None None      Comments Off to a great start with exercise. Reviewed METs, goals with patient. Increased WL on SE.      Duration Continue with 30 min of aerobic exercise without signs/symptoms of physical distress. Continue with 30 min of aerobic  exercise without signs/symptoms of physical distress.      Intensity THRR unchanged THRR unchanged        Progression   Progression Continue to progress workloads to maintain intensity without signs/symptoms of physical distress. Continue to progress workloads to maintain intensity without signs/symptoms of physical distress.      Average METs 2.1 2.5        Resistance Training   Training Prescription Yes Yes      Weight 3 lbs 4 lbs      Reps 10-15 10-15      Time 10 Minutes 10 Minutes        Interval Training   Interval Training No No        Treadmill   MPH 1.7 1.7      Grade 0 0      Minutes 15 15      METs 2.3 2.3        T5 Nustep   Level 3 4  SPM 96 117      Minutes 15 15      METs 2 2.7               Exercise Comments:   Exercise Comments     Row Name 03/10/23 1127 03/31/23 1102         Exercise Comments Patient tolerated low intensity exercise well without symptoms. Reviewed METs and goals with patient.               Exercise Goals and Review:   Exercise Goals     Row Name 03/04/23 0920             Exercise Goals   Increase Physical Activity Yes       Intervention Provide advice, education, support and counseling about physical activity/exercise needs.;Develop an individualized exercise prescription for aerobic and resistive training based on initial evaluation findings, risk stratification, comorbidities and participant's personal goals.       Expected Outcomes Short Term: Attend rehab on a regular basis to increase amount of physical activity.;Long Term: Add in home exercise to make exercise part of routine and to increase amount of physical activity.;Long Term: Exercising regularly at least 3-5 days a week.       Increase Strength and Stamina Yes       Intervention Provide advice, education, support and counseling about physical activity/exercise needs.;Develop an individualized exercise prescription for aerobic and resistive training  based on initial evaluation findings, risk stratification, comorbidities and participant's personal goals.       Expected Outcomes Short Term: Increase workloads from initial exercise prescription for resistance, speed, and METs.;Short Term: Perform resistance training exercises routinely during rehab and add in resistance training at home;Long Term: Improve cardiorespiratory fitness, muscular endurance and strength as measured by increased METs and functional capacity ( )       Able to understand and use rate of perceived exertion (RPE) scale Yes       Intervention Provide education and explanation on how to use RPE scale       Expected Outcomes Short Term: Able to use RPE daily in rehab to express subjective intensity level;Long Term:  Able to use RPE to guide intensity level when exercising independently       Knowledge and understanding of Target Heart Rate Range (THRR) Yes       Intervention Provide education and explanation of THRR including how the numbers were predicted and where they are located for reference       Expected Outcomes Short Term: Able to state/look up THRR;Long Term: Able to use THRR to govern intensity when exercising independently;Short Term: Able to use daily as guideline for intensity in rehab       Able to check pulse independently Yes       Intervention Provide education and demonstration on how to check pulse in carotid and radial arteries.;Review the importance of being able to check your own pulse for safety during independent exercise       Expected Outcomes Short Term: Able to explain why pulse checking is important during independent exercise;Long Term: Able to check pulse independently and accurately       Understanding of Exercise Prescription Yes       Intervention Provide education, explanation, and written materials on patient's individual exercise prescription       Expected Outcomes Short Term: Able to explain program exercise prescription;Long Term: Able to  explain home exercise prescription to exercise independently  Exercise Goals Re-Evaluation :  Exercise Goals Re-Evaluation     Row Name 03/10/23 1127 03/31/23 1102           Exercise Goal Re-Evaluation   Exercise Goals Review Increase Physical Activity;Able to understand and use rate of perceived exertion (RPE) scale Increase Physical Activity;Able to understand and use rate of perceived exertion (RPE) scale;Increase Strength and Stamina      Comments Patient able to understand and use RPE scale appropriately. Reviewed METs with patient and discussed how to increase MET level. Increased workload on NuStep and tolerated well. Will increase WL on TM next session.      Expected Outcomes Progress workloads as tolerated to help increase cardiorespiratory fitness. Increase workloads as tolerated to help increase MET level.               Discharge Exercise Prescription (Final Exercise Prescription Changes):  Exercise Prescription Changes - 03/31/23 1008       Response to Exercise   Blood Pressure (Admit) 112/60    Blood Pressure (Exercise) 118/70    Blood Pressure (Exit) 96/60    Heart Rate (Admit) 65 bpm    Heart Rate (Exercise) 92 bpm    Heart Rate (Exit) 74 bpm    Rating of Perceived Exertion (Exercise) 7    Symptoms None    Comments Reviewed METs, goals with patient. Increased WL on SE.    Duration Continue with 30 min of aerobic exercise without signs/symptoms of physical distress.    Intensity THRR unchanged      Progression   Progression Continue to progress workloads to maintain intensity without signs/symptoms of physical distress.    Average METs 2.5      Resistance Training   Training Prescription Yes    Weight 4 lbs    Reps 10-15    Time 10 Minutes      Interval Training   Interval Training No      Treadmill   MPH 1.7    Grade 0    Minutes 15    METs 2.3      T5 Nustep   Level 4    SPM 117    Minutes 15    METs 2.7              Nutrition:  Target Goals: Understanding of nutrition guidelines, daily intake of sodium 1500mg , cholesterol 200mg , calories 30% from fat and 7% or less from saturated fats, daily to have 5 or more servings of fruits and vegetables.  Biometrics:  Pre Biometrics - 03/04/23 0908       Pre Biometrics   Waist Circumference 50.5 inches    Hip Circumference 53 inches    Waist to Hip Ratio 0.95 %    Triceps Skinfold 16 mm    % Body Fat 36.6 %    Grip Strength 36 kg    Flexibility --   Not performed, right hip replacement.   Single Leg Stand 1.75 seconds              Nutrition Therapy Plan and Nutrition Goals:  Nutrition Therapy & Goals - 03/10/23 1049       Nutrition Therapy   Diet Heart healthy diet    Drug/Food Interactions Statins/Certain Fruits      Personal Nutrition Goals   Nutrition Goal Patient to identify strategies for reducing cardiovascular risk by attending the [employer] education and nutrition series weekly.    Personal Goal #2 Patient to improve diet quality by using the plate  method as a guide for meal planning to include lean protein/plant protein, fruits, vegetables, whole grains, nonfat dairy as part of a well-balanced diet.    Personal Goal #3 Patient to reduce sodium in the diet to 1500mg  per day    Comments Patient will benefit from participation in intensive cardiac rehab for nutrition, exercise, and lifestyle modification.      Intervention Plan   Intervention Prescribe, educate and counsel regarding individualized specific dietary modifications aiming towards targeted core components such as weight, hypertension, lipid management, diabetes, heart failure and other comorbidities.;Nutrition handout(s) given to patient.    Expected Outcomes Short Term Goal: Understand basic principles of dietary content, such as calories, fat, sodium, cholesterol and nutrients.;Long Term Goal: Adherence to prescribed nutrition plan.             Nutrition  Assessments:  MEDIFICTS Score Key: ?70 Need to make dietary changes  40-70 Heart Healthy Diet ? 40 Therapeutic Level Cholesterol Diet    Picture Your Plate Scores: <16 Unhealthy dietary pattern with much room for improvement. 41-50 Dietary pattern unlikely to meet recommendations for good health and room for improvement. 51-60 More healthful dietary pattern, with some room for improvement.  >60 Healthy dietary pattern, although there may be some specific behaviors that could be improved.    Nutrition Goals Re-Evaluation:  Nutrition Goals Re-Evaluation     Row Name 03/10/23 1049             Goals   Current Weight 273 lb 9.5 oz (124.1 kg)       Comment lipids WNL- HDL 37, lipoproteinA 237.7, A1c WNL       Expected Outcome Patient will benefit from participation in intensive cardiac rehab for nutrition, exercise, and lifestyle modification.                Nutrition Goals Re-Evaluation:  Nutrition Goals Re-Evaluation     Row Name 03/10/23 1049             Goals   Current Weight 273 lb 9.5 oz (124.1 kg)       Comment lipids WNL- HDL 37, lipoproteinA 237.7, A1c WNL       Expected Outcome Patient will benefit from participation in intensive cardiac rehab for nutrition, exercise, and lifestyle modification.                Nutrition Goals Discharge (Final Nutrition Goals Re-Evaluation):  Nutrition Goals Re-Evaluation - 03/10/23 1049       Goals   Current Weight 273 lb 9.5 oz (124.1 kg)    Comment lipids WNL- HDL 37, lipoproteinA 237.7, A1c WNL    Expected Outcome Patient will benefit from participation in intensive cardiac rehab for nutrition, exercise, and lifestyle modification.             Psychosocial: Target Goals: Acknowledge presence or absence of significant depression and/or stress, maximize coping skills, provide positive support system. Participant is able to verbalize types and ability to use techniques and skills needed for reducing stress  and depression.  Initial Review & Psychosocial Screening:  Initial Psych Review & Screening - 03/04/23 1212       Initial Review   Current issues with None Identified      Family Dynamics   Good Support System? Yes   Patient has his wife and  2 children for support     Screening Interventions   Interventions Encouraged to exercise    Expected Outcomes Long Term Goal: Stressors or current issues are  controlled or eliminated.             Quality of Life Scores:  Quality of Life - 03/04/23 1343       Quality of Life   Select Quality of Life      Quality of Life Scores   Health/Function Pre 29.6 %    Socioeconomic Pre 30 %    Psych/Spiritual Pre 25.71 %    Family Pre 30 %    GLOBAL Pre 28.94 %            Scores of 19 and below usually indicate a poorer quality of life in these areas.  A difference of  2-3 points is a clinically meaningful difference.  A difference of 2-3 points in the total score of the Quality of Life Index has been associated with significant improvement in overall quality of life, self-image, physical symptoms, and general health in studies assessing change in quality of life.  PHQ-9: Review Flowsheet  More data exists      03/04/2023 12/16/2022 11/25/2022 08/14/2022 08/13/2022  Depression screen PHQ 2/9  Decreased Interest 0 0 0 0 0  Down, Depressed, Hopeless 0 0 0 0 0  PHQ - 2 Score 0 0 0 0 0  Altered sleeping 0 - - - -  Tired, decreased energy 0 - - - -  Change in appetite 0 - - - -  Feeling bad or failure about yourself  0 - - - -  Trouble concentrating 0 - - - -  Moving slowly or fidgety/restless 0 - - - -  Suicidal thoughts 0 - - - -  PHQ-9 Score 0 - - - -   Interpretation of Total Score  Total Score Depression Severity:  1-4 = Minimal depression, 5-9 = Mild depression, 10-14 = Moderate depression, 15-19 = Moderately severe depression, 20-27 = Severe depression   Psychosocial Evaluation and Intervention:   Psychosocial  Re-Evaluation:  Psychosocial Re-Evaluation     Row Name 03/10/23 1104 04/01/23 1017           Psychosocial Re-Evaluation   Current issues with None Identified None Identified      Interventions Encouraged to attend Cardiac Rehabilitation for the exercise Encouraged to attend Cardiac Rehabilitation for the exercise      Continue Psychosocial Services  No Follow up required No Follow up required               Psychosocial Discharge (Final Psychosocial Re-Evaluation):  Psychosocial Re-Evaluation - 04/01/23 1017       Psychosocial Re-Evaluation   Current issues with None Identified    Interventions Encouraged to attend Cardiac Rehabilitation for the exercise    Continue Psychosocial Services  No Follow up required             Vocational Rehabilitation: Provide vocational rehab assistance to qualifying candidates.   Vocational Rehab Evaluation & Intervention:  Vocational Rehab - 03/04/23 1212       Initial Vocational Rehab Evaluation & Intervention   Assessment shows need for Vocational Rehabilitation No   Steven Munoz is retired and does not need vocational rehab at this time            Education: Education Goals: Education classes will be provided on a weekly basis, covering required topics. Participant will state understanding/return demonstration of topics presented.    Education     Row Name 03/10/23 1300     Education   Cardiac Education Topics [employer]ritikin   [employer]  Workshops   Geophysical data processor Nutrition   Nutrition Workshop Fueling a Fish farm manager Review Code 1- Teaching laboratory technician Start Time 1140   Class Stop Time 1230   Class Time Calculation (min) 50 min    Row Name 03/12/23 1300     Education   Cardiac Education Topics Pritikin   Customer service manager   Weekly Topic Simple Sides and Sauces   Instruction Review Code 1- Verbalizes Understanding    Class Start Time 1140   Class Stop Time 1215   Class Time Calculation (min) 35 min    Row Name 03/14/23 1300     Education   Cardiac Education Topics Pritikin   Nurse, children's Exercise Physiologist   Select Psychosocial   Psychosocial How Our Thoughts Can Heal Our Hearts   Instruction Review Code 1- Verbalizes Understanding   Class Start Time 1150   Class Stop Time 1226   Class Time Calculation (min) 36 min    Row Name 03/17/23 1200     Education   Cardiac Education Topics Pritikin   Geographical information systems officer Psychosocial   Psychosocial Workshop From Head to Heart: The Power of a Healthy Outlook   Instruction Review Code 1- Verbalizes Understanding   Class Start Time 1153   Class Stop Time 1237   Class Time Calculation (min) 44 min    Row Name 03/19/23 1300     Education   Cardiac Education Topics Pritikin   Customer service manager   Weekly Topic Powerhouse Plant-Based Proteins   Instruction Review Code 1- Verbalizes Understanding   Class Start Time 1135   Class Stop Time 1210   Class Time Calculation (min) 35 min    Row Name 03/21/23 1400     Education   Cardiac Education Topics Pritikin   Psychologist, forensic General Education   General Education Hypertension and Heart Disease   Instruction Review Code 1- Verbalizes Understanding   Class Start Time 1145   Class Stop Time 1220   Class Time Calculation (min) 35 min    Row Name 03/26/23 1300     Education   Cardiac Education Topics Pritikin   Customer service manager   Weekly Topic Adding Flavor - Sodium-Free   Instruction Review Code 1- Verbalizes Understanding   Class Start Time 1138   Class Stop Time 1210   Class Time Calculation (min) 32 min    Row Name 03/28/23 1200     Education    Cardiac Education Topics Pritikin   Hospital doctor Education   General Education Heart Disease Risk Reduction   Instruction Review Code 1- Verbalizes Understanding   Class Start Time 1135   Class Stop Time 1210   Class Time Calculation (min) 35 min    Row Name 03/31/23 1300     Education   Cardiac Education Topics Pritikin   Geographical information systems officer Exercise   Exercise Workshop Location manager and  Fall Prevention   Instruction Review Code 1- Verbalizes Understanding   Class Start Time 1146   Class Stop Time 1226   Class Time Calculation (min) 40 min            Core Videos: Exercise    Move It!  Clinical staff conducted group or individual video education with verbal and written material and guidebook.  Patient learns the recommended Pritikin exercise program. Exercise with the goal of living a long, healthy life. Some of the health benefits of exercise include controlled diabetes, healthier blood pressure levels, improved cholesterol levels, improved heart and lung capacity, improved sleep, and better body composition. Everyone should speak with their doctor before starting or changing an exercise routine.  Biomechanical Limitations Clinical staff conducted group or individual video education with verbal and written material and guidebook.  Patient learns how biomechanical limitations can impact exercise and how we can mitigate and possibly overcome limitations to have an impactful and balanced exercise routine.  Body Composition Clinical staff conducted group or individual video education with verbal and written material and guidebook.  Patient learns that body composition (ratio of muscle mass to fat mass) is a key component to assessing overall fitness, rather than body weight alone. Increased fat mass, especially visceral belly fat, can put [place] at  increased risk for metabolic syndrome, type 2 diabetes, heart disease, and even death. It is recommended to combine diet and exercise (cardiovascular and resistance training) to improve your body composition. Seek guidance from your physician and exercise physiologist before implementing an exercise routine.  Exercise Action Plan Clinical staff conducted group or individual video education with verbal and written material and guidebook.  Patient learns the recommended strategies to achieve and enjoy long-term exercise adherence, including variety, self-motivation, self-efficacy, and positive decision making. Benefits of exercise include fitness, good health, weight management, more energy, better sleep, less stress, and overall well-being.  Medical   Heart Disease Risk Reduction Clinical staff conducted group or individual video education with verbal and written material and guidebook.  Patient learns our heart is our most vital organ as it circulates oxygen, nutrients, white blood cells, and hormones throughout the entire body, and carries waste away. Data supports a plant-based eating plan like the Pritikin Program for its effectiveness in slowing progression of and reversing heart disease. The video provides a number of recommendations to address heart disease.   Metabolic Syndrome and Belly Fat  Clinical staff conducted group or individual video education with verbal and written material and guidebook.  Patient learns what metabolic syndrome is, how it leads to heart disease, and how one can reverse it and keep it from coming back. You have metabolic syndrome if you have 3 of the following 5 criteria: abdominal obesity, high blood pressure, high triglycerides, low HDL cholesterol, and high blood sugar.  Hypertension and Heart Disease Clinical staff conducted group or individual video education with verbal and written material and guidebook.  Patient learns that high blood pressure, or  hypertension, is very common in the [place]. Hypertension is largely due to excessive salt intake, but other important risk factors include being overweight, physical inactivity, drinking too much alcohol, smoking, and not eating enough potassium from fruits and vegetables. High blood pressure is a leading risk factor for heart attack, stroke, congestive heart failure, dementia, kidney failure, and premature death. Long-term effects of excessive salt intake include stiffening of the arteries and thickening of heart muscle and organ damage. Recommendations include ways to reduce hypertension and the  risk of heart disease.  Diseases of Our Time - Focusing on Diabetes Clinical staff conducted group or individual video education with verbal and written material and guidebook.  Patient learns why the best way to stop diseases of our time is prevention, through food and other lifestyle changes. Medicine (such as prescription pills and surgeries) is often only a Band-Aid on the problem, not a long-term solution. Most common diseases of our time include obesity, type 2 diabetes, hypertension, heart disease, and cancer. The Pritikin Program is recommended and has been proven to help reduce, reverse, and/or prevent the damaging effects of metabolic syndrome.  Nutrition   Overview of the Pritikin Eating Plan  Clinical staff conducted group or individual video education with verbal and written material and guidebook.  Patient learns about the Pritikin Eating Plan for disease risk reduction. The Pritikin Eating Plan emphasizes a wide variety of unrefined, minimally-processed carbohydrates, like fruits, vegetables, whole grains, and legumes. Go, Caution, and Stop food choices are explained. Plant-based and lean animal proteins are emphasized. Rationale provided for low sodium intake for blood pressure control, low added sugars for blood sugar stabilization, and low added fats and oils for coronary artery disease  risk reduction and weight management.  Calorie Density  Clinical staff conducted group or individual video education with verbal and written material and guidebook.  Patient learns about calorie density and how it impacts the Pritikin Eating Plan. Knowing the characteristics of the food you choose will help you decide whether those foods will lead to weight gain or weight loss, and whether you want to consume more or less of them. Weight loss is usually a side effect of the Pritikin Eating Plan because of its focus on low calorie-dense foods.  Label Reading  Clinical staff conducted group or individual video education with verbal and written material and guidebook.  Patient learns about the Pritikin recommended label reading guidelines and corresponding recommendations regarding calorie density, added sugars, sodium content, and whole grains.  Dining Out - Part 1  Clinical staff conducted group or individual video education with verbal and written material and guidebook.  Patient learns that restaurant meals can be sabotaging because they can be so high in calories, fat, sodium, and/or sugar. Patient learns recommended strategies on how to positively address this and avoid unhealthy pitfalls.  Facts on Fats  Clinical staff conducted group or individual video education with verbal and written material and guidebook.  Patient learns that lifestyle modifications can be just as effective, if not more so, as many medications for lowering your risk of heart disease. A Pritikin lifestyle can help to reduce your risk of inflammation and atherosclerosis (cholesterol build-up, or plaque, in the artery walls). Lifestyle interventions such as dietary choices and physical activity address the cause of atherosclerosis. A review of the types of fats and their impact on blood cholesterol levels, along with dietary recommendations to reduce fat intake is also included.  Nutrition Action Plan  Clinical staff  conducted group or individual video education with verbal and written material and guidebook.  Patient learns how to incorporate Pritikin recommendations into their lifestyle. Recommendations include planning and keeping personal health goals in mind as an important part of their success.  Healthy Mind-Set    Healthy Minds, Bodies, Hearts  Clinical staff conducted group or individual video education with verbal and written material and guidebook.  Patient learns how to identify when they are stressed. Video will discuss the impact of that stress, as well as the many benefits  of stress management. Patient will also be introduced to stress management techniques. The way we think, act, and feel has an impact on our hearts.  How Our Thoughts Can Heal Our Hearts  Clinical staff conducted group or individual video education with verbal and written material and guidebook.  Patient learns that negative thoughts can cause depression and anxiety. This can result in negative lifestyle behavior and serious health problems. Cognitive behavioral therapy is an effective method to help control our thoughts in order to change and improve our emotional outlook.  Additional Videos:  Exercise    Improving Performance  Clinical staff conducted group or individual video education with verbal and written material and guidebook.  Patient learns to use a non-linear approach by alternating intensity levels and lengths of time spent exercising to help burn more calories and lose more body fat. Cardiovascular exercise helps improve heart health, metabolism, hormonal balance, blood sugar control, and recovery from fatigue. Resistance training improves strength, endurance, balance, coordination, reaction time, metabolism, and muscle mass. Flexibility exercise improves circulation, posture, and balance. Seek guidance from your physician and exercise physiologist before implementing an exercise routine and learn your capabilities  and proper form for all exercise.  Introduction to Yoga  Clinical staff conducted group or individual video education with verbal and written material and guidebook.  Patient learns about yoga, a discipline of the coming together of mind, breath, and body. The benefits of yoga include improved flexibility, improved range of motion, better posture and core strength, increased lung function, weight loss, and positive self-image. Yoga's heart health benefits include lowered blood pressure, healthier heart rate, decreased cholesterol and triglyceride levels, improved immune function, and reduced stress. Seek guidance from your physician and exercise physiologist before implementing an exercise routine and learn your capabilities and proper form for all exercise.  Medical   Aging: Enhancing Your Quality of Life  Clinical staff conducted group or individual video education with verbal and written material and guidebook.  Patient learns key strategies and recommendations to stay in good physical health and enhance quality of life, such as prevention strategies, having an advocate, securing a Health Care Proxy and Power of Attorney, and keeping a list of medications and system for tracking them. It also discusses how to avoid risk for bone loss.  Biology of Weight Control  Clinical staff conducted group or individual video education with verbal and written material and guidebook.  Patient learns that weight gain occurs because we consume more calories than we burn (eating more, moving less). Even if your body weight is normal, you may have higher ratios of fat compared to muscle mass. Too much body fat puts you at increased risk for cardiovascular disease, heart attack, stroke, type 2 diabetes, and obesity-related cancers. In addition to exercise, following the Pritikin Eating Plan can help reduce your risk.  Decoding Lab Results  Clinical staff conducted group or individual video education with verbal and  written material and guidebook.  Patient learns that lab test reflects one measurement whose values change over time and are influenced by many factors, including medication, stress, sleep, exercise, food, hydration, pre-existing medical conditions, and more. It is recommended to use the knowledge from this video to become more involved with your lab results and evaluate your numbers to speak with your doctor.   Diseases of Our Time - Overview  Clinical staff conducted group or individual video education with verbal and written material and guidebook.  Patient learns that according to the CDC, 50% to 70%  of chronic diseases (such as obesity, type 2 diabetes, elevated lipids, hypertension, and heart disease) are avoidable through lifestyle improvements including healthier food choices, listening to satiety cues, and increased physical activity.  Sleep Disorders Clinical staff conducted group or individual video education with verbal and written material and guidebook.  Patient learns how good quality and duration of sleep are important to overall health and well-being. Patient also learns about sleep disorders and how they impact health along with recommendations to address them, including discussing with a physician.  Nutrition  Dining Out - Part 2 Clinical staff conducted group or individual video education with verbal and written material and guidebook.  Patient learns how to plan ahead and communicate in order to maximize their dining experience in a healthy and nutritious manner. Included are recommended food choices based on the type of restaurant the patient is visiting.   Fueling a Banker conducted group or individual video education with verbal and written material and guidebook.  There is a strong connection between our food choices and our health. Diseases like obesity and type 2 diabetes are very prevalent and are in large-part due to lifestyle choices. The  Pritikin Eating Plan provides plenty of food and hunger-curbing satisfaction. It is easy to follow, affordable, and helps reduce health risks.  Menu Workshop  Clinical staff conducted group or individual video education with verbal and written material and guidebook.  Patient learns that restaurant meals can sabotage health goals because they are often packed with calories, fat, sodium, and sugar. Recommendations include strategies to plan ahead and to communicate with the manager, chef, or server to help order a healthier meal.  Planning Your Eating Strategy  Clinical staff conducted group or individual video education with verbal and written material and guidebook.  Patient learns about the Pritikin Eating Plan and its benefit of reducing the risk of disease. The Pritikin Eating Plan does not focus on calories. Instead, it emphasizes high-quality, nutrient-rich foods. By knowing the characteristics of the foods, we choose, we can determine their calorie density and make informed decisions.  Targeting Your Nutrition Priorities  Clinical staff conducted group or individual video education with verbal and written material and guidebook.  Patient learns that lifestyle habits have a tremendous impact on disease risk and progression. This video provides eating and physical activity recommendations based on your personal health goals, such as reducing LDL cholesterol, losing weight, preventing or controlling type 2 diabetes, and reducing high blood pressure.  Vitamins and Minerals  Clinical staff conducted group or individual video education with verbal and written material and guidebook.  Patient learns different ways to obtain key vitamins and minerals, including through a recommended healthy diet. It is important to discuss all supplements you take with your doctor.   Healthy Mind-Set    Smoking Cessation  Clinical staff conducted group or individual video education with verbal and written  material and guidebook.  Patient learns that cigarette smoking and tobacco addiction pose a serious health risk which affects millions of people. Stopping smoking will significantly reduce the risk of heart disease, lung disease, and many forms of cancer. Recommended strategies for quitting are covered, including working with your doctor to develop a successful plan.  Culinary   Becoming a Set designer conducted group or individual video education with verbal and written material and guidebook.  Patient learns that cooking at home can be healthy, cost-effective, quick, and puts them in control. Keys to cooking healthy recipes will  include looking at your recipe, assessing your equipment needs, planning ahead, making it simple, choosing cost-effective seasonal ingredients, and limiting the use of added fats, salts, and sugars.  Cooking - Breakfast and Snacks  Clinical staff conducted group or individual video education with verbal and written material and guidebook.  Patient learns how important breakfast is to satiety and nutrition through the entire day. Recommendations include key foods to eat during breakfast to help stabilize blood sugar levels and to prevent overeating at meals later in the day. Planning ahead is also a key component.  Cooking - Educational psychologist conducted group or individual video education with verbal and written material and guidebook.  Patient learns eating strategies to improve overall health, including an approach to cook more at home. Recommendations include thinking of animal protein as a side on your plate rather than center stage and focusing instead on lower calorie dense options like vegetables, fruits, whole grains, and plant-based proteins, such as beans. Making sauces in large quantities to freeze for later and leaving the skin on your vegetables are also recommended to maximize your experience.  Cooking - Healthy Salads and  Dressing Clinical staff conducted group or individual video education with verbal and written material and guidebook.  Patient learns that vegetables, fruits, whole grains, and legumes are the foundations of the Pritikin Eating Plan. Recommendations include how to incorporate each of these in flavorful and healthy salads, and how to create homemade salad dressings. Proper handling of ingredients is also covered. Cooking - Soups and [employer] - Soups and Desserts Clinical staff conducted group or individual video education with verbal and written material and guidebook.  Patient learns that Pritikin soups and desserts make for easy, nutritious, and delicious snacks and meal components that are low in sodium, fat, sugar, and calorie density, while high in vitamins, minerals, and filling fiber. Recommendations include simple and healthy ideas for soups and desserts.   Overview     The Pritikin Solution Program Overview Clinical staff conducted group or individual video education with verbal and written material and guidebook.  Patient learns that the results of the Pritikin Program have been documented in more than 100 articles published in peer-reviewed journals, and the benefits include reducing risk factors for (and, in some cases, even reversing) high cholesterol, high blood pressure, type 2 diabetes, obesity, and more! An overview of the three key pillars of the Pritikin Program will be covered: eating well, doing regular exercise, and having a healthy mind-set.  WORKSHOPS  Exercise: Exercise Basics: Building Your Action Plan Clinical staff led group instruction and group discussion with PowerPoint presentation and patient guidebook. To enhance the learning environment the use of posters, models and videos may be added. At the conclusion of this workshop, patients will comprehend the difference between physical activity and exercise, as well as the benefits of incorporating both, into  their routine. Patients will understand the FITT (Frequency, Intensity, Time, and Type) principle and how to use it to build an exercise action plan. In addition, safety concerns and other considerations for exercise and cardiac rehab will be addressed by the presenter. The purpose of this lesson is to promote a comprehensive and effective weekly exercise routine in order to improve patients' overall level of fitness.   Managing Heart Disease: Your Path to a Healthier Heart Clinical staff led group instruction and group discussion with PowerPoint presentation and patient guidebook. To enhance the learning environment the use of posters, models and videos may  be added.At the conclusion of this workshop, patients will understand the anatomy and physiology of the heart. Additionally, they will understand how Pritikin's three pillars impact the risk factors, the progression, and the management of heart disease.  The purpose of this lesson is to provide a high-level overview of the heart, heart disease, and how the Pritikin lifestyle positively impacts risk factors.  Exercise Biomechanics Clinical staff led group instruction and group discussion with PowerPoint presentation and patient guidebook. To enhance the learning environment the use of posters, models and videos may be added. Patients will learn how the structural parts of their bodies function and how these functions impact their daily activities, movement, and exercise. Patients will learn how to promote a neutral spine, learn how to manage pain, and identify ways to improve their physical movement in order to promote healthy living. The purpose of this lesson is to expose patients to common physical limitations that impact physical activity. Participants will learn practical ways to adapt and manage aches and pains, and to minimize their effect on regular exercise. Patients will learn how to maintain good posture while sitting, walking, and  lifting.  Balance Training and Fall Prevention  Clinical staff led group instruction and group discussion with PowerPoint presentation and patient guidebook. To enhance the learning environment the use of posters, models and videos may be added. At the conclusion of this workshop, patients will understand the importance of their sensorimotor skills (vision, proprioception, and the vestibular system) in maintaining their ability to balance as they age. Patients will apply a variety of balancing exercises that are appropriate for their current level of function. Patients will understand the common causes for poor balance, possible solutions to these problems, and ways to modify their physical environment in order to minimize their fall risk. The purpose of this lesson is to teach patients about the importance of maintaining balance as they age and ways to minimize their risk of falling.  WORKSHOPS   Nutrition:  Fueling a Ship broker led group instruction and group discussion with PowerPoint presentation and patient guidebook. To enhance the learning environment the use of posters, models and videos may be added. Patients will review the foundational principles of the Pritikin Eating Plan and understand what constitutes a serving size in each of the food groups. Patients will also learn Pritikin-friendly foods that are better choices when away from home and review make-ahead meal and snack options. Calorie density will be reviewed and applied to three nutrition priorities: weight maintenance, weight loss, and weight gain. The purpose of this lesson is to reinforce (in a group setting) the key concepts around what patients are recommended to eat and how to apply these guidelines when away from home by planning and selecting Pritikin-friendly options. Patients will understand how calorie density may be adjusted for different weight management goals.  Mindful Eating  Clinical staff led  group instruction and group discussion with PowerPoint presentation and patient guidebook. To enhance the learning environment the use of posters, models and videos may be added. Patients will briefly review the concepts of the Pritikin Eating Plan and the importance of low-calorie dense foods. The concept of mindful eating will be introduced as well as the importance of paying attention to internal hunger signals. Triggers for non-hunger eating and techniques for dealing with triggers will be explored. The purpose of this lesson is to provide patients with the opportunity to review the basic principles of the Pritikin Eating Plan, discuss the value of eating mindfully  and how to measure internal cues of hunger and fullness using the Hunger Scale. Patients will also discuss reasons for non-hunger eating and learn strategies to use for controlling emotional eating.  Targeting Your Nutrition Priorities Clinical staff led group instruction and group discussion with PowerPoint presentation and patient guidebook. To enhance the learning environment the use of posters, models and videos may be added. Patients will learn how to determine their genetic susceptibility to disease by reviewing their family history. Patients will gain insight into the importance of diet as part of an overall healthy lifestyle in mitigating the impact of genetics and other environmental insults. The purpose of this lesson is to provide patients with the opportunity to assess their personal nutrition priorities by looking at their family history, their own health history and current risk factors. Patients will also be able to discuss ways of prioritizing and modifying the Pritikin Eating Plan for their highest risk areas  Menu  Clinical staff led group instruction and group discussion with PowerPoint presentation and patient guidebook. To enhance the learning environment the use of posters, models and videos may be added. Using menus  brought in from [employer], or printed from [employer], patients will apply the Pritikin dining out guidelines that were presented in the [employer] video. Patients will also be able to practice these guidelines in a variety of provided scenarios. The purpose of this lesson is to provide patients with the opportunity to practice hands-on learning of the Pritikin Dining Out guidelines with actual menus and practice scenarios.  Label Reading Clinical staff led group instruction and group discussion with PowerPoint presentation and patient guidebook. To enhance the learning environment the use of posters, models and videos may be added. Patients will review and discuss the [facility]in label reading guidelines presented in [employer]'s Label Reading Educational series video. Using fool labels brought in from local grocery stores and markets, patients will apply the label reading guidelines and determine if the packaged food meet the Pritikin guidelines. The purpose of this lesson is to provide patients with the opportunity to review, discuss, and practice hands-on learning of the P[employer]ikin Label Reading guidelines with actual packaged food labels. Cooking School  [employer]'s [employer] are designed to teach patients ways to prepare quick, simple, and affordable recipes at home. The importance of nutrition's role in chronic disease risk reduction is reflected in its emphasis in the overall Pritikin program. By learning how to prepare essential core Pritikin Eating Plan recipes, patients will increase control over what they eat; be able to customize the flavor of foods without the use of added salt, sugar, or fat; and improve the quality of the food they consume. By learning a set of core recipes which are easily assembled, quickly prepared, and affordable, patients are more likely to prepare more healthy foods at home. These workshops focus on convenient breakfasts, simple  entres, side dishes, and desserts which can be prepared with minimal effort and are consistent with nutrition recommendations for cardiovascular risk reduction. Cooking [employer] are taught by a Armed forces logistics/support/administrative officer (RD) who has been trained by the [employer]. The chef or RD has a clear understanding of the importance of minimizing - if not completely eliminating - added fat, sugar, and sodium in recipes. Throughout the series of Cooking School Workshop sessions, patients will learn about healthy ingredients and efficient methods of cooking to build confidence in their capability to prepare    [employer] weekly topics:  Adding Flavor- Sodium-Free  Fast and Healthy Breakfasts  Powerhouse Plant-Based Proteins  Satisfying Salads and Dressings  Simple Sides and Sauces  International Cuisine-Spotlight on the Blue Zones  Delicious Desserts  Savory Soups  Efficiency Cooking - Meals in a Snap  Tasty Appetizers and Snacks  Comforting Weekend Breakfasts  One-Pot Wonders   Fast Evening Meals  Landscape architect Your Pritikin Plate  WORKSHOPS   Healthy Mindset (Psychosocial):  Focused Goals, Sustainable Changes Clinical staff led group instruction and group discussion with PowerPoint presentation and patient guidebook. To enhance the learning environment the use of posters, models and videos may be added. Patients will be able to apply effective goal setting strategies to establish at least one personal goal, and then take consistent, meaningful action toward that goal. They will learn to identify common barriers to achieving personal goals and develop strategies to overcome them. Patients will also gain an understanding of how our mind-set can impact our ability to achieve goals and the importance of cultivating a positive and growth-oriented mind-set. The purpose of this lesson is to provide patients with a deeper understanding of how to set and  achieve personal goals, as well as the tools and strategies needed to overcome common obstacles which may arise along the way.  From Head to Heart: The Power of a Healthy Outlook  Clinical staff led group instruction and group discussion with PowerPoint presentation and patient guidebook. To enhance the learning environment the use of posters, models and videos may be added. Patients will be able to recognize and describe the impact of emotions and mood on physical health. They will discover the importance of self-care and explore self-care practices which may work for them. Patients will also learn how to utilize the 4 C's to cultivate a healthier outlook and better manage stress and challenges. The purpose of this lesson is to demonstrate to patients how a healthy outlook is an essential part of maintaining good health, especially as they continue their cardiac rehab journey.  Healthy Sleep for a Healthy Heart Clinical staff led group instruction and group discussion with PowerPoint presentation and patient guidebook. To enhance the learning environment the use of posters, models and videos may be added. At the conclusion of this workshop, patients will be able to demonstrate knowledge of the importance of sleep to overall health, well-being, and quality of life. They will understand the symptoms of, and treatments for, common sleep disorders. Patients will also be able to identify daytime and nighttime behaviors which impact sleep, and they will be able to apply these tools to help manage sleep-related challenges. The purpose of this lesson is to provide patients with a general overview of sleep and outline the importance of quality sleep. Patients will learn about a few of the most common sleep disorders. Patients will also be introduced to the concept of "sleep hygiene," and discover ways to self-manage certain sleeping problems through simple daily behavior changes. Finally, the workshop will motivate  patients by clarifying the links between quality sleep and their goals of heart-healthy living.   Recognizing and Reducing Stress Clinical staff led group instruction and group discussion with PowerPoint presentation and patient guidebook. To enhance the learning environment the use of posters, models and videos may be added. At the conclusion of this workshop, patients will be able to understand the types of stress reactions, differentiate between acute and chronic stress, and recognize the impact that chronic stress has on their health. They will also be able to apply  different coping mechanisms, such as reframing negative self-talk. Patients will have the opportunity to practice a variety of stress management techniques, such as deep abdominal breathing, progressive muscle relaxation, and/or guided imagery.  The purpose of this lesson is to educate patients on the role of stress in their lives and to provide healthy techniques for coping with it.  Learning Barriers/Preferences:  Learning Barriers/Preferences - 03/04/23 1219       Learning Barriers/Preferences   Learning Barriers Sight;Hearing   wears glasses, has hearing aides both ears   Learning Preferences Pictoral;Skilled Demonstration;Written Material             Education Topics:  Knowledge Questionnaire Score:  Knowledge Questionnaire Score - 03/04/23 1344       Knowledge Questionnaire Score   Pre Score 21/24             Core Components/Risk Factors/Patient Goals at Admission:  Personal Goals and Risk Factors at Admission - 03/04/23 0920       Core Components/Risk Factors/Patient Goals on Admission    Weight Management Obesity;Yes    Intervention Weight Management/Obesity: Establish reasonable short term and long term weight goals.;Obesity: Provide education and appropriate resources to help participant work on and attain dietary goals.    Admit Weight 273 lb 9.5 oz (124.1 kg)    Expected Outcomes Short Term:  Continue to assess and modify interventions until short term weight is achieved;Long Term: Adherence to nutrition and physical activity/exercise program aimed toward attainment of established weight goal;Weight Loss: Understanding of general recommendations for a balanced deficit meal plan, which promotes 1-2 lb weight loss per week and includes a negative energy balance of [phone] kcal/d    Hypertension Yes    Intervention Provide education on lifestyle modifcations including regular physical activity/exercise, weight management, moderate sodium restriction and increased consumption of fresh fruit, vegetables, and low fat dairy, alcohol moderation, and smoking cessation.;Monitor prescription use compliance.    Expected Outcomes Short Term: Continued assessment and intervention until BP is < 140/65mm HG in hypertensive participants. < 130/41mm HG in hypertensive participants with diabetes, heart failure or chronic kidney disease.;Long Term: Maintenance of blood pressure at goal levels.    Lipids Yes    Intervention Provide education and support for participant on nutrition & aerobic/resistive exercise along with prescribed medications to achieve LDL 70mg , HDL >40mg .    Expected Outcomes Short Term: Participant states understanding of desired cholesterol values and is compliant with medications prescribed. Participant is following exercise prescription and nutrition guidelines.;Long Term: Cholesterol controlled with medications as prescribed, with individualized exercise RX and with personalized nutrition plan. Value goals: LDL < 70mg , HDL > 40 mg.             Core Components/Risk Factors/Patient Goals Review:   Goals and Risk Factor Review     Row Name 03/10/23 1105 04/01/23 1017           Core Components/Risk Factors/Patient Goals Review   Personal Goals Review Weight Management/Obesity;Hypertension;Lipids Weight Management/Obesity;Hypertension;Lipids      Review Steven Munoz started [facility] on  03/10/2023 and did well with exercise. VSS. Pt has frequent PVCs: asymptomtic. MD aware. Steven Munoz continues to do well with exercise at intensive cardiac rehab. Vital signs have been Munoz. Steven Munoz has lost 5.1 kg since starting the program      Expected Outcomes Steven Munoz will continue to particiapte in [facility]CR for exercise, nurttrion, and life-styel modifications. Steven Munoz will continue to particiapte in [facility] for exercise, nurttrion, and life-style modifications.  Core Components/Risk Factors/Patient Goals at Discharge (Final Review):   Goals and Risk Factor Review - 04/01/23 1017       Core Components/Risk Factors/Patient Goals Review   Personal Goals Review Weight Management/Obesity;Hypertension;Lipids    Review Steven Munoz continues to do well with exercise at intensive cardiac rehab. Vital signs have been Munoz. Steven Munoz has lost 5.1 kg since starting the program    Expected Outcomes Steven Munoz will continue to particiapte in [facility] for exercise, nurttrion, and life-style modifications.             ITP Comments:  ITP Comments     Row Name 03/04/23 0908 03/10/23 1103 04/01/23 1007       ITP Comments Medical Director- Dr. Armanda Magic, MD. Patient oriented to [facility] Education / Intensive Cardiac Rehab Program. Initial orientation packet reviewed with patient. 30-day ITP review. Steven Munoz started [employer] on 03/10/2023 and did well with exercise. 30-day ITP review. Steven Munoz has good attendance and participation in intensive cardiac rehab              Comments: See ITP Comments

## 2023-04-02 ENCOUNTER — Encounter (HOSPITAL_COMMUNITY)
Admission: RE | Admit: 2023-04-02 | Discharge: 2023-04-02 | Disposition: A | Discharge status: Home / Self Care | Payer: Medicare HMO | Source: Ambulatory Visit | Attending: Cardiovascular Disease | Admitting: Cardiovascular Disease

## 2023-04-02 DIAGNOSIS — I213 ST elevation (STEMI) myocardial infarction of unspecified site: Secondary | ICD-10-CM

## 2023-04-04 ENCOUNTER — Encounter (HOSPITAL_COMMUNITY)
Admission: RE | Admit: 2023-04-04 | Discharge: 2023-04-04 | Disposition: A | Discharge status: Home / Self Care | Payer: Medicare HMO | Source: Ambulatory Visit | Attending: Cardiovascular Disease | Admitting: Cardiovascular Disease

## 2023-04-04 DIAGNOSIS — I213 ST elevation (STEMI) myocardial infarction of unspecified site: Secondary | ICD-10-CM

## 2023-04-07 ENCOUNTER — Encounter (HOSPITAL_COMMUNITY)
Admission: RE | Admit: 2023-04-07 | Discharge: 2023-04-07 | Disposition: A | Discharge status: Home / Self Care | Payer: Medicare HMO | Source: Ambulatory Visit | Attending: Cardiovascular Disease | Admitting: Cardiovascular Disease

## 2023-04-07 DIAGNOSIS — I213 ST elevation (STEMI) myocardial infarction of unspecified site: Secondary | ICD-10-CM

## 2023-04-07 NOTE — Progress Notes (Signed)
Reviewed home exercise guidelines with patient including endpoints, temperature precautions, target heart rate and rate of perceived exertion. Patient is currently riding his recumbent bike at home 15 minutes 2-3 days/week as his mode of home exercise. Discussed increasing duration from 15 to 30 minutes, and he's amenable to this. He also has hand weights that he can use for his resistance training. Patient voices understanding of instructions given.  Artist Pais, MS, ACSM CEP

## 2023-04-09 ENCOUNTER — Encounter (HOSPITAL_COMMUNITY)
Admission: RE | Admit: 2023-04-09 | Discharge: 2023-04-09 | Disposition: A | Discharge status: Home / Self Care | Payer: Medicare HMO | Source: Ambulatory Visit | Attending: Cardiovascular Disease | Admitting: Cardiovascular Disease

## 2023-04-09 DIAGNOSIS — I213 ST elevation (STEMI) myocardial infarction of unspecified site: Secondary | ICD-10-CM

## 2023-04-11 ENCOUNTER — Other Ambulatory Visit: Payer: Self-pay | Admitting: *Deleted

## 2023-04-11 ENCOUNTER — Encounter (HOSPITAL_COMMUNITY)
Admission: RE | Admit: 2023-04-11 | Discharge: 2023-04-11 | Disposition: A | Discharge status: Home / Self Care | Payer: Medicare HMO | Source: Ambulatory Visit | Attending: Cardiovascular Disease | Admitting: Cardiovascular Disease

## 2023-04-11 DIAGNOSIS — I213 ST elevation (STEMI) myocardial infarction of unspecified site: Secondary | ICD-10-CM

## 2023-04-11 MED ORDER — ENTRESTO 49-51 MG PO TABS: 1.0000 | ORAL_TABLET | Freq: Two times a day (BID) | ORAL | 4 refills | Status: DC

## 2023-04-11 MED ORDER — CLOPIDOGREL BISULFATE 75 MG PO TABS: 75.0000 mg | ORAL_TABLET | Freq: Every day | ORAL | 2 refills | Status: DC

## 2023-04-11 MED ORDER — NITROGLYCERIN 0.4 MG SL SUBL: 0.4000 mg | SUBLINGUAL_TABLET | SUBLINGUAL | 1 refills | Status: DC | PRN

## 2023-04-11 MED ORDER — ROSUVASTATIN CALCIUM 40 MG PO TABS: 40.0000 mg | ORAL_TABLET | Freq: Every day | ORAL | 1 refills | Status: DC

## 2023-04-11 MED ORDER — METOPROLOL SUCCINATE ER 25 MG PO TB24: 25.0000 mg | ORAL_TABLET | Freq: Every day | ORAL | 1 refills | Status: DC

## 2023-04-14 ENCOUNTER — Encounter (HOSPITAL_COMMUNITY)
Admission: RE | Admit: 2023-04-14 | Discharge: 2023-04-14 | Disposition: A | Discharge status: Home / Self Care | Payer: Medicare HMO | Source: Ambulatory Visit | Attending: Cardiovascular Disease | Admitting: Cardiovascular Disease

## 2023-04-14 DIAGNOSIS — I213 ST elevation (STEMI) myocardial infarction of unspecified site: Secondary | ICD-10-CM

## 2023-04-16 ENCOUNTER — Encounter (HOSPITAL_COMMUNITY)
Admission: RE | Admit: 2023-04-16 | Discharge: 2023-04-16 | Disposition: A | Discharge status: Home / Self Care | Payer: Medicare HMO | Source: Ambulatory Visit | Attending: Cardiovascular Disease | Admitting: Cardiovascular Disease

## 2023-04-16 DIAGNOSIS — I213 ST elevation (STEMI) myocardial infarction of unspecified site: Secondary | ICD-10-CM | POA: Diagnosis not present

## 2023-04-18 ENCOUNTER — Encounter (HOSPITAL_COMMUNITY)
Admission: RE | Admit: 2023-04-18 | Discharge: 2023-04-18 | Disposition: A | Discharge status: Home / Self Care | Payer: Medicare HMO | Source: Ambulatory Visit | Attending: Cardiovascular Disease | Admitting: Cardiovascular Disease

## 2023-04-18 DIAGNOSIS — I213 ST elevation (STEMI) myocardial infarction of unspecified site: Secondary | ICD-10-CM | POA: Diagnosis not present

## 2023-04-21 ENCOUNTER — Encounter (HOSPITAL_COMMUNITY)
Admission: RE | Admit: 2023-04-21 | Discharge: 2023-04-21 | Disposition: A | Discharge status: Home / Self Care | Payer: Medicare HMO | Source: Ambulatory Visit | Attending: Cardiovascular Disease | Admitting: Cardiovascular Disease

## 2023-04-21 DIAGNOSIS — I213 ST elevation (STEMI) myocardial infarction of unspecified site: Secondary | ICD-10-CM | POA: Diagnosis not present

## 2023-04-23 ENCOUNTER — Encounter (HOSPITAL_COMMUNITY)
Admission: RE | Admit: 2023-04-23 | Discharge: 2023-04-23 | Disposition: A | Discharge status: Home / Self Care | Payer: Medicare HMO | Source: Ambulatory Visit | Attending: Cardiovascular Disease | Admitting: Cardiovascular Disease

## 2023-04-23 DIAGNOSIS — I213 ST elevation (STEMI) myocardial infarction of unspecified site: Secondary | ICD-10-CM | POA: Diagnosis not present

## 2023-04-25 ENCOUNTER — Encounter (HOSPITAL_COMMUNITY)
Admission: RE | Admit: 2023-04-25 | Discharge: 2023-04-25 | Disposition: A | Discharge status: Home / Self Care | Payer: Medicare HMO | Source: Ambulatory Visit | Attending: Cardiovascular Disease | Admitting: Cardiovascular Disease

## 2023-04-25 DIAGNOSIS — I213 ST elevation (STEMI) myocardial infarction of unspecified site: Secondary | ICD-10-CM

## 2023-04-28 ENCOUNTER — Encounter (HOSPITAL_COMMUNITY)
Admission: RE | Admit: 2023-04-28 | Discharge: 2023-04-28 | Disposition: A | Discharge status: Home / Self Care | Payer: Medicare HMO | Source: Ambulatory Visit | Attending: Cardiovascular Disease | Admitting: Cardiovascular Disease

## 2023-04-28 DIAGNOSIS — I213 ST elevation (STEMI) myocardial infarction of unspecified site: Secondary | ICD-10-CM | POA: Diagnosis not present

## 2023-04-29 NOTE — Progress Notes (Signed)
Cardiac Individual Treatment Plan  Patient Details  Name: Steven Munoz MRN: [id number] Date of Birth: 1941/03/14 Referring Provider:   Flowsheet Row INTENSIVE CARDIAC REHAB ORIENT from 03/04/2023 in [facility]  Referring Provider Wendall Stade, MD       Initial Encounter Date:  Flowsheet Row INTENSIVE CARDIAC REHAB ORIENT from 03/04/2023 in [facility]  Date 03/04/23       Visit Diagnosis: 01/25/23 STEMI  Patient's Home Medications on Admission:  Current Outpatient Medications:    allopurinol (ZYLOPRIM) 100 MG tablet, TAKE 1 TABLET BY MOUTH EVERY DAY, Disp: 90 tablet, Rfl: 3   aspirin EC 81 MG tablet, Take 81 mg by mouth daily., Disp: , Rfl:    clopidogrel (PLAVIX) 75 MG tablet, Take 1 tablet (75 mg total) by mouth daily with breakfast., Disp: 90 tablet, Rfl: 2   Coenzyme Q10 (COQ10 PO), Take 1 capsule by mouth daily., Disp: , Rfl:    colestipol (COLESTID) 1 g tablet, Take 1 g by mouth daily., Disp: , Rfl:    Cyanocobalamin (VITAMIN B-12 PO), Take 1 tablet by mouth daily., Disp: , Rfl:    dapagliflozin propanediol (FARXIGA) 10 MG TABS tablet, Take 1 tablet (10 mg total) by mouth daily., Disp: 90 tablet, Rfl: 3   famotidine (PEPCID) 40 MG tablet, Take 40 mg by mouth at bedtime., Disp: , Rfl:    fluorouracil (EFUDEX) 5 % cream, Apply 1 application  topically daily as needed (facial rash)., Disp: , Rfl:    furosemide (LASIX) 20 MG tablet, Take 1 tablet (20 mg total) by mouth daily as needed., Disp: 30 tablet, Rfl: 1   hydrALAZINE (APRESOLINE) 10 MG tablet, Take 1 tablet (10 mg total) by mouth 2 (two) times daily as needed. Take one tablet as needed for systolic BP >150, Disp: 60 tablet, Rfl: 0   levothyroxine (SYNTHROID) 125 MCG tablet, Take 1 tablet (125 mcg total) by mouth daily before breakfast., Disp: 90 tablet, Rfl: 1   magnesium oxide (MAG-OX) 400 (240 Mg) MG  tablet, Take 400 mg by mouth at bedtime., Disp: , Rfl:    metoprolol succinate (TOPROL-XL) 25 MG 24 hr tablet, Take 1 tablet (25 mg total) by mouth daily., Disp: 90 tablet, Rfl: 1   Multiple Vitamins-Minerals (PRESERVISION AREDS 2+MULTI VIT) CAPS, Take 1 capsule by mouth 2 (two) times daily. , Disp: , Rfl:    nitroGLYCERIN (NITROSTAT) 0.4 MG SL tablet, Place 1 tablet (0.4 mg total) under the tongue every 5 (five) minutes as needed., Disp: 25 tablet, Rfl: 1   Omega-3 Fatty Acids (FISH OIL PO), Take 1 capsule by mouth daily., Disp: , Rfl:    rosuvastatin (CRESTOR) 40 MG tablet, Take 1 tablet (40 mg total) by mouth daily., Disp: 90 tablet, Rfl: 1   sacubitril-valsartan (ENTRESTO) 49-51 MG, Take 1 tablet by mouth 2 (two) times daily., Disp: 60 tablet, Rfl: 4   spironolactone (ALDACTONE) 25 MG tablet, Take 0.5 tablets (12.5 mg total) by mouth daily., Disp: 45 tablet, Rfl: 3  Past Medical History: Past Medical History:  Diagnosis Date   CAD (coronary artery disease) CABG  05/29/09    OPERATIVE PROCEDURES:  Median sternotomy, extracorporeal circulation,    Colonic polyp 2002, 2005, 2009   Dr Kinnie Scales   Dermatophytosis of nail    Diverticulosis    Dysmetabolic syndrome    GERD (gastroesophageal reflux disease)    S/P dilation  2005  Gout    Hyperlipidemia    Hypertension    Hypothyroidism    Murmur    Nephrolithiasis    X 1   Peripheral neuropathy 03/20/2021   Pneumonia age 51   treated as inpatient   Sleep apnea    on CPAP; Dr Vassie Loll    Tobacco Use: Social History   Tobacco Use  Smoking Status Former   Packs/day: 1.00   Years: 6.00   Additional pack years: 0.00   Total pack years: 6.00   Types: Cigarettes   Quit date: 10/28/1966   Years since quitting: 56.5  Smokeless Tobacco Never  Tobacco Comments   smoked 1962- 1968, up to < 1 ppd    Labs: Review Flowsheet  More data exists      Latest Ref Rng & Units 02/26/2021 07/04/2021 05/09/2022 08/14/2022 01/28/2023  Labs for ITP Cardiac  and Pulmonary Rehab  Cholestrol 0 - 200 mg/dL - 409  - 811  86   LDL (calc) 0 - 99 mg/dL - 57  - 55  35   HDL-C >40 mg/dL - 91.47  - 82.95  37   Trlycerides <150 mg/dL - 621.3  - 086.5  72   Hemoglobin A1c 4.6 - 6.5 % 5.7  - 5.7  - -    Capillary Blood Glucose: Lab Results  Component Value Date   GLUCAP 87 07/12/2009   GLUCAP 94 07/12/2009   GLUCAP 101 (H) 07/10/2009   GLUCAP 101 (H) 07/10/2009   GLUCAP 117 (H) 07/07/2009     Exercise Target Goals: Exercise Program Goal: Individual exercise prescription set using results from initial 6 min walk test and THRR while considering  patient's activity barriers and safety.   Exercise Prescription Goal: Initial exercise prescription builds to 30-45 minutes a day of aerobic activity, 2-3 days per week.  Home exercise guidelines will be given to patient during program as part of exercise prescription that the participant will acknowledge.  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    Cardiac Risk Stratification High             6 Minute Walk:  6 Minute Walk     Row Name 03/04/23 1035         6 Minute Walk   Phase Initial     Distance 1213 feet     Walk Time 6 minutes     # of Rest Breaks 0     MPH 2.3     METS 1.56     RPE 9     Perceived Dyspnea  0     VO2 Peak 5.46     Symptoms Yes (comment)     Comments Bilateral feet pain from neuropathy, which he rates a 1-2 out of 10 on the pain scale and right hip pain, which he also rates a 1-2 out of 10 on the pain scale.     Resting HR 86 bpm     Resting BP 102/52      Resting Oxygen Saturation  98 %     Exercise Oxygen Saturation  during 6 min walk 96 %     Max Ex. HR  93 bpm     Max Ex. BP 152/62     2 Minute Post BP 122/52              Oxygen Initial Assessment:   Oxygen Re-Evaluation:   Oxygen Discharge (Final Oxygen Re-Evaluation):   Initial Exercise Prescription:  Initial Exercise Prescription - 03/04/23 1200       Date of Initial Exercise RX and Referring Provider   Date 03/04/23    Referring Provider Wendall Stade, MD    Expected Discharge Date 05/16/23      Treadmill   MPH 1.7    Grade 0    Minutes 15    METs 2.3      T5 Nustep   Level 3    SPM 85    Minutes 15    METs 2.3      Prescription Details   Frequency (times per week) 3    Duration Progress to 30 minutes of continuous aerobic without signs/symptoms of physical distress      Intensity   THRR 40-80% of Max Heartrate 55-110    Ratings of Perceived Exertion 11-13    Perceived Dyspnea 0-4      Progression   Progression Continue to progress workloads to maintain intensity without signs/symptoms of physical distress.      Resistance Training   Training Prescription Yes    Weight 3 lbs    Reps 10-15             Perform Capillary Blood Glucose checks as needed.  Exercise Prescription Changes:   Exercise Prescription Changes     Row Name 03/10/23 1027 03/31/23 1008 04/14/23 1025 04/28/23 1017       Response to Exercise   Blood Pressure (Admit) 118/70 112/60 100/62 102/62    Blood Pressure (Exercise) 128/60 118/70 142/78 132/68    Blood Pressure (Exit) 110/70 96/60 94/60  104/72    Heart Rate (Admit) 85 bpm 65 bpm 64 bpm 75 bpm    Heart Rate (Exercise) 94 bpm 92 bpm 91 bpm 98 bpm    Heart Rate (Exit) 59 bpm 74 bpm 66 bpm 72 bpm    Rating of Perceived Exertion (Exercise) 9 7 12 12     Symptoms None None None None    Comments Off to a great start with exercise. Reviewed METs, goals with patient. Increased WL on SE. -- Reviewed METs    Duration  Continue with 30 min of aerobic exercise without signs/symptoms of physical distress. Continue with 30 min of aerobic exercise without signs/symptoms of physical distress. Continue with 30 min of aerobic exercise without signs/symptoms of physical distress. Continue with 30 min of aerobic exercise without signs/symptoms of physical distress.    Intensity THRR unchanged THRR unchanged THRR unchanged THRR unchanged      Progression   Progression Continue to progress workloads to maintain intensity without signs/symptoms of physical distress. Continue to progress workloads to maintain intensity without signs/symptoms of physical distress. Continue to progress workloads to maintain intensity without signs/symptoms of physical distress. Continue to progress workloads to maintain intensity without signs/symptoms of physical distress.    Average METs 2.1 2.5 2.9 3.2      Resistance Training   Training Prescription Yes Yes Yes Yes    Weight 3 lbs 4 lbs 4 lbs 5 lbs    Reps 10-15 10-15 10-15 10-15    Time 10 Minutes 10 Minutes 10 Minutes 10 Minutes      Interval Training   Interval Training No  No No No      Treadmill   MPH 1.7 1.7 2.2 2.2    Grade 0 0 0 0    Minutes 15 15 15 15     METs 2.3 2.3 2.69 2.69      T5 Nustep   Level 3 4 4 4     SPM 96 117 117 129    Minutes 15 15 15 15     METs 2 2.7 3.1 3.7      Home Exercise Plan   Plans to continue exercise at -- -- Home (comment)  Recumbent bike at home Home (comment)  Recumbent bike at home    Frequency -- -- Add 3 additional days to program exercise sessions. Add 3 additional days to program exercise sessions.    Initial Home Exercises Provided -- -- 04/07/23 04/07/23             Exercise Comments:   Exercise Comments     Row Name 03/10/23 1127 03/31/23 1102 04/07/23 1049 04/16/23 1114 04/28/23 1117   Exercise Comments Patient tolerated low intensity exercise well without symptoms. Reviewed METs and goals with patient. Reviewed home  exercise guidelines and goals with patient. Reviewed METs with patient. Reviewed METs with patient.            Exercise Goals and Review:   Exercise Goals     Row Name 03/04/23 0920             Exercise Goals   Increase Physical Activity Yes       Intervention Provide advice, education, support and counseling about physical activity/exercise needs.;Develop an individualized exercise prescription for aerobic and resistive training based on initial evaluation findings, risk stratification, comorbidities and participant's personal goals.       Expected Outcomes Short Term: Attend rehab on a regular basis to increase amount of physical activity.;Long Term: Add in home exercise to make exercise part of routine and to increase amount of physical activity.;Long Term: Exercising regularly at least 3-5 days a week.       Increase Strength and Stamina Yes       Intervention Provide advice, education, support and counseling about physical activity/exercise needs.;Develop an individualized exercise prescription for aerobic and resistive training based on initial evaluation findings, risk stratification, comorbidities and participant's personal goals.       Expected Outcomes Short Term: Increase workloads from initial exercise prescription for resistance, speed, and METs.;Short Term: Perform resistance training exercises routinely during rehab and add in resistance training at home;Long Term: Improve cardiorespiratory fitness, muscular endurance and strength as measured by increased METs and functional capacity ( )       Able to understand and use rate of perceived exertion (RPE) scale Yes       Intervention Provide education and explanation on how to use RPE scale       Expected Outcomes Short Term: Able to use RPE daily in rehab to express subjective intensity level;Long Term:  Able to use RPE to guide intensity level when exercising independently       Knowledge and understanding of Target Heart  Rate Range (THRR) Yes       Intervention Provide education and explanation of THRR including how the numbers were predicted and where they are located for reference       Expected Outcomes Short Term: Able to state/look up THRR;Long Term: Able to use THRR to govern intensity when exercising independently;Short Term: Able to use daily as guideline for intensity in rehab  Able to check pulse independently Yes       Intervention Provide education and demonstration on how to check pulse in carotid and radial arteries.;Review the importance of being able to check your own pulse for safety during independent exercise       Expected Outcomes Short Term: Able to explain why pulse checking is important during independent exercise;Long Term: Able to check pulse independently and accurately       Understanding of Exercise Prescription Yes       Intervention Provide education, explanation, and written materials on patient's individual exercise prescription       Expected Outcomes Short Term: Able to explain program exercise prescription;Long Term: Able to explain home exercise prescription to exercise independently                Exercise Goals Re-Evaluation :  Exercise Goals Re-Evaluation     Row Name 03/10/23 1127 03/31/23 1102 04/07/23 1049         Exercise Goal Re-Evaluation   Exercise Goals Review Increase Physical Activity;Able to understand and use rate of perceived exertion (RPE) scale Increase Physical Activity;Able to understand and use rate of perceived exertion (RPE) scale;Increase Strength and Stamina Increase Physical Activity;Able to understand and use rate of perceived exertion (RPE) scale;Increase Strength and Stamina;Able to check pulse independently;Knowledge and understanding of Target Heart Rate Range (THRR);Understanding of Exercise Prescription     Comments Patient able to understand and use RPE scale appropriately. Reviewed METs with patient and discussed how to increase MET  level. Increased workload on NuStep and tolerated well. Will increase WL on TM next session. Reviewed exercise prescription with patient. He is riding his recumbent bike at home 15 minutes, 2-3 days/week and hand weight that he can use for his resistance training. Steven Munoz was previously exercising at the gym. He has a pulse oximeter that he can use to monitor his pulse.     Expected Outcomes Progress workloads as tolerated to help increase cardiorespiratory fitness. Increase workloads as tolerated to help increase MET level. Steven Munoz will increase his exercise duration at home from 15 minutes to 30 minutes 2-3 days/week.              Discharge Exercise Prescription (Final Exercise Prescription Changes):  Exercise Prescription Changes - 04/28/23 1017       Response to Exercise   Blood Pressure (Admit) 102/62    Blood Pressure (Exercise) 132/68    Blood Pressure (Exit) 104/72    Heart Rate (Admit) 75 bpm    Heart Rate (Exercise) 98 bpm    Heart Rate (Exit) 72 bpm    Rating of Perceived Exertion (Exercise) 12    Symptoms None    Comments Reviewed METs    Duration Continue with 30 min of aerobic exercise without signs/symptoms of physical distress.    Intensity THRR unchanged      Progression   Progression Continue to progress workloads to maintain intensity without signs/symptoms of physical distress.    Average METs 3.2      Resistance Training   Training Prescription Yes    Weight 5 lbs    Reps 10-15    Time 10 Minutes      Interval Training   Interval Training No      Treadmill   MPH 2.2    Grade 0    Minutes 15    METs 2.69      T5 Nustep   Level 4    SPM 129    Minutes  15    METs 3.7      Home Exercise Plan   Plans to continue exercise at Home (comment)   Recumbent bike at home   Frequency Add 3 additional days to program exercise sessions.    Initial Home Exercises Provided 04/07/23             Nutrition:  Target Goals: Understanding of nutrition guidelines,  daily intake of sodium 1500mg , cholesterol 200mg , calories 30% from fat and 7% or less from saturated fats, daily to have 5 or more servings of fruits and vegetables.  Biometrics:  Pre Biometrics - 03/04/23 0908       Pre Biometrics   Waist Circumference 50.5 inches    Hip Circumference 53 inches    Waist to Hip Ratio 0.95 %    Triceps Skinfold 16 mm    % Body Fat 36.6 %    Grip Strength 36 kg    Flexibility --   Not performed, right hip replacement.   Single Leg Stand 1.75 seconds              Nutrition Therapy Plan and Nutrition Goals:  Nutrition Therapy & Goals - 04/10/23 0941       Nutrition Therapy   Diet Heart healthy diet    Drug/Food Interactions Statins/Certain Fruits      Personal Nutrition Goals   Nutrition Goal Patient to identify strategies for reducing cardiovascular risk by attending the [employer] education and nutrition series weekly.    Personal Goal #2 Patient to improve diet quality by using the plate method as a guide for meal planning to include lean protein/plant protein, fruits, vegetables, whole grains, nonfat dairy as part of a well-balanced diet.    Personal Goal #3 Patient to reduce sodium in the diet to 1500mg  per day    Comments Goals in action. Steven Munoz continues to attend the [employer] and nutrition series. He continues to work on heart healthy dietary changes including increased dietary fiber and decreased saturated fat intake. He is down 13.9# since starting with our program. Patient will benefit from participation in intensive cardiac rehab for nutrition, exercise, and lifestyle modification.      Intervention Plan   Intervention Prescribe, educate and counsel regarding individualized specific dietary modifications aiming towards targeted core components such as weight, hypertension, lipid management, diabetes, heart failure and other comorbidities.;Nutrition handout(s) given to patient.    Expected Outcomes Short Term Goal: Understand  basic principles of dietary content, such as calories, fat, sodium, cholesterol and nutrients.;Long Term Goal: Adherence to prescribed nutrition plan.             Nutrition Assessments:  MEDIFICTS Score Key: ?70 Need to make dietary changes  40-70 Heart Healthy Diet ? 40 Therapeutic Level Cholesterol Diet    Picture Your Plate Scores: <16 Unhealthy dietary pattern with much room for improvement. 41-50 Dietary pattern unlikely to meet recommendations for good health and room for improvement. 51-60 More healthful dietary pattern, with some room for improvement.  >60 Healthy dietary pattern, although there may be some specific behaviors that could be improved.    Nutrition Goals Re-Evaluation:  Nutrition Goals Re-Evaluation     Row Name 03/10/23 1049 04/10/23 0941           Goals   Current Weight 273 lb 9.5 oz (124.1 kg) 259 lb 11.2 oz (117.8 kg)      Comment lipids WNL- HDL 37, lipoproteinA 237.7, A1c WNL no new labs; most recent labs lipids WNL- HDL  37, lipoproteinA 237.7, A1c WNL      Expected Outcome Patient will benefit from participation in intensive cardiac rehab for nutrition, exercise, and lifestyle modification. Goals in action. Steven Munoz continues to attend the [employer] and nutrition series. He continues to work on heart healthy dietary changes including increased dietary fiber and decreased saturated fat intake. He is down 13.9# since starting with our program. Patient will benefit from participation in intensive cardiac rehab for nutrition, exercise, and lifestyle modification.               Nutrition Goals Re-Evaluation:  Nutrition Goals Re-Evaluation     Row Name 03/10/23 1049 04/10/23 0941           Goals   Current Weight 273 lb 9.5 oz (124.1 kg) 259 lb 11.2 oz (117.8 kg)      Comment lipids WNL- HDL 37, lipoproteinA 237.7, A1c WNL no new labs; most recent labs lipids WNL- HDL 37, lipoproteinA 237.7, A1c WNL      Expected Outcome Patient will  benefit from participation in intensive cardiac rehab for nutrition, exercise, and lifestyle modification. Goals in action. Steven Munoz continues to attend the [employer] and nutrition series. He continues to work on heart healthy dietary changes including increased dietary fiber and decreased saturated fat intake. He is down 13.9# since starting with our program. Patient will benefit from participation in intensive cardiac rehab for nutrition, exercise, and lifestyle modification.               Nutrition Goals Discharge (Final Nutrition Goals Re-Evaluation):  Nutrition Goals Re-Evaluation - 04/10/23 0941       Goals   Current Weight 259 lb 11.2 oz (117.8 kg)    Comment no new labs; most recent labs lipids WNL- HDL 37, lipoproteinA 237.7, A1c WNL    Expected Outcome Goals in action. Steven Munoz continues to attend the [employer] and nutrition series. He continues to work on heart healthy dietary changes including increased dietary fiber and decreased saturated fat intake. He is down 13.9# since starting with our program. Patient will benefit from participation in intensive cardiac rehab for nutrition, exercise, and lifestyle modification.             Psychosocial: Target Goals: Acknowledge presence or absence of significant depression and/or stress, maximize coping skills, provide positive support system. Participant is able to verbalize types and ability to use techniques and skills needed for reducing stress and depression.  Initial Review & Psychosocial Screening:  Initial Psych Review & Screening - 03/04/23 1212       Initial Review   Current issues with None Identified      Family Dynamics   Good Support System? Yes   Patient has his wife and  2 children for support     Screening Interventions   Interventions Encouraged to exercise    Expected Outcomes Long Term Goal: Stressors or current issues are controlled or eliminated.             Quality of Life Scores:   Quality of Life - 03/04/23 1343       Quality of Life   Select Quality of Life      Quality of Life Scores   Health/Function Pre 29.6 %    Socioeconomic Pre 30 %    Psych/Spiritual Pre 25.71 %    Family Pre 30 %    GLOBAL Pre 28.94 %            Scores of 19 and below  usually indicate a poorer quality of life in these areas.  A difference of  2-3 points is a clinically meaningful difference.  A difference of 2-3 points in the total score of the Quality of Life Index has been associated with significant improvement in overall quality of life, self-image, physical symptoms, and general health in studies assessing change in quality of life.  PHQ-9: Review Flowsheet  More data exists      03/04/2023 12/16/2022 11/25/2022 08/14/2022 08/13/2022  Depression screen PHQ 2/9  Decreased Interest 0 0 0 0 0  Down, Depressed, Hopeless 0 0 0 0 0  PHQ - 2 Score 0 0 0 0 0  Altered sleeping 0 - - - -  Tired, decreased energy 0 - - - -  Change in appetite 0 - - - -  Feeling bad or failure about yourself  0 - - - -  Trouble concentrating 0 - - - -  Moving slowly or fidgety/restless 0 - - - -  Suicidal thoughts 0 - - - -  PHQ-9 Score 0 - - - -   Interpretation of Total Score  Total Score Depression Severity:  1-4 = Minimal depression, 5-9 = Mild depression, 10-14 = Moderate depression, 15-19 = Moderately severe depression, 20-27 = Severe depression   Psychosocial Evaluation and Intervention:   Psychosocial Re-Evaluation:  Psychosocial Re-Evaluation     Row Name 03/10/23 1104 04/01/23 1017 04/29/23 1357         Psychosocial Re-Evaluation   Current issues with None Identified None Identified None Identified     Interventions Encouraged to attend Cardiac Rehabilitation for the exercise Encouraged to attend Cardiac Rehabilitation for the exercise Encouraged to attend Cardiac Rehabilitation for the exercise     Continue Psychosocial Services  No Follow up required No Follow up required No  Follow up required              Psychosocial Discharge (Final Psychosocial Re-Evaluation):  Psychosocial Re-Evaluation - 04/29/23 1357       Psychosocial Re-Evaluation   Current issues with None Identified    Interventions Encouraged to attend Cardiac Rehabilitation for the exercise    Continue Psychosocial Services  No Follow up required             Vocational Rehabilitation: Provide vocational rehab assistance to qualifying candidates.   Vocational Rehab Evaluation & Intervention:  Vocational Rehab - 03/04/23 1212       Initial Vocational Rehab Evaluation & Intervention   Assessment shows need for Vocational Rehabilitation No   Steven Munoz is retired and does not need vocational rehab at this time            Education: Education Goals: Education classes will be provided on a weekly basis, covering required topics. Participant will state understanding/return demonstration of topics presented.    Education     Row Name 03/10/23 1300     Education   Cardiac Education Topics [employer]   Glass blower/designer Nutrition   Nutrition Workshop Fueling a Forensic psychologist   Instruction Review Code 1- Teaching laboratory technician Start Time 1140   Class Stop Time 1230   Class Time Calculation (min) 50 min    Row Name 03/12/23 1300     Education   Cardiac Education Topics Pritikin   Customer service manager   Weekly Topic Simple Sides and Sauces  Instruction Review Code 1- Verbalizes Understanding   Class Start Time 1140   Class Stop Time 1215   Class Time Calculation (min) 35 min    Row Name 03/14/23 1300     Education   Cardiac Education Topics Pritikin   Nurse, children's Exercise Physiologist   Select Psychosocial   Psychosocial How Our Thoughts Can Heal Our Hearts   Instruction Review Code 1- Verbalizes Understanding   Class Start Time  1150   Class Stop Time 1226   Class Time Calculation (min) 36 min    Row Name 03/17/23 1200     Education   Cardiac Education Topics Pritikin   Geographical information systems officer Psychosocial   Psychosocial Workshop From Head to Heart: The Power of a Healthy Outlook   Instruction Review Code 1- Verbalizes Understanding   Class Start Time 1153   Class Stop Time 1237   Class Time Calculation (min) 44 min    Row Name 03/19/23 1300     Education   Cardiac Education Topics Pritikin   Customer service manager   Weekly Topic Powerhouse Plant-Based Proteins   Instruction Review Code 1- Verbalizes Understanding   Class Start Time 1135   Class Stop Time 1210   Class Time Calculation (min) 35 min    Row Name 03/21/23 1400     Education   Cardiac Education Topics Pritikin   Psychologist, forensic General Education   General Education Hypertension and Heart Disease   Instruction Review Code 1- Verbalizes Understanding   Class Start Time 1145   Class Stop Time 1220   Class Time Calculation (min) 35 min    Row Name 03/26/23 1300     Education   Cardiac Education Topics Pritikin   Customer service manager   Weekly Topic Adding Flavor - Sodium-Free   Instruction Review Code 1- Verbalizes Understanding   Class Start Time 1138   Class Stop Time 1210   Class Time Calculation (min) 32 min    Row Name 03/28/23 1200     Education   Cardiac Education Topics Pritikin   Hospital doctor Education   General Education Heart Disease Risk Reduction   Instruction Review Code 1- Verbalizes Understanding   Class Start Time 1135   Class Stop Time 1210   Class Time Calculation (min) 35 min    Row Name 03/31/23 1300     Education   Cardiac  Education Topics Pritikin   Select Workshops     Workshops   Educator Exercise Physiologist   Select Exercise   Exercise Workshop Location manager and Fall Prevention   Instruction Review Code 1- Verbalizes Understanding   Class Start Time 1146   Class Stop Time 1226   Class Time Calculation (min) 40 min    Row Name 04/02/23 1300     Education   Cardiac Education Topics Pritikin   Customer service manager   Weekly Topic Fast and Healthy Breakfasts   Instruction Review Code 1- Verbalizes Understanding   Class Start Time 1140   Class  Stop Time 1212   Class Time Calculation (min) 32 min    Row Name 04/04/23 1300     Education   Cardiac Education Topics Pritikin   Select Core Videos     Core Videos   Educator Dietitian   Select Nutrition   Nutrition Overview of the Pritikin Eating Plan   Instruction Review Code 1- Verbalizes Understanding   Class Start Time 1145   Class Stop Time 1220   Class Time Calculation (min) 35 min    Row Name 04/07/23 1600     Education   Cardiac Education Topics Pritikin   Select Workshops     Workshops   Educator Exercise Physiologist   Select Psychosocial   Psychosocial Workshop Recognizing and Reducing Stress   Instruction Review Code 1- Verbalizes Understanding   Class Start Time 1152   Class Stop Time 1234   Class Time Calculation (min) 42 min    Row Name 04/09/23 1300     Education   Cardiac Education Topics Pritikin   Customer service manager   Weekly Topic Personalizing Your Pritikin Plate   Instruction Review Code 1- Verbalizes Understanding   Class Start Time 1145   Class Stop Time 1220   Class Time Calculation (min) 35 min    Row Name 04/11/23 1300     Education   Cardiac Education Topics Pritikin   Nurse, children's Exercise Physiologist   Select Psychosocial   Psychosocial Healthy Minds, Bodies, Hearts    Instruction Review Code 1- Verbalizes Understanding   Class Start Time 1150   Class Stop Time 1236   Class Time Calculation (min) 46 min    Row Name 04/14/23 1500     Education   Cardiac Education Topics Pritikin   Glass blower/designer Nutrition   Nutrition Workshop Label Reading   Instruction Review Code 1- Verbalizes Understanding   Class Start Time 1145   Class Stop Time 1221   Class Time Calculation (min) 36 min    Row Name 04/16/23 1500     Education   Cardiac Education Topics Pritikin   Customer service manager   Weekly Topic [employer] Desserts   Instruction Review Code 1- Verbalizes Understanding   Class Start Time 1145   Class Stop Time 1235   Class Time Calculation (min) 50 min    Row Name 04/18/23 1300     Education   Cardiac Education Topics Pritikin   Licensed conveyancer Nutrition   Nutrition Other  label reading   Instruction Review Code 1- Verbalizes Understanding   Class Start Time 1147   Class Stop Time 1226   Class Time Calculation (min) 39 min    Row Name 04/21/23 1300     Education   Cardiac Education Topics Pritikin   [employer]     Workshops   Educator Exercise Physiologist   Select Exercise   Exercise Workshop Exercise Basics: Building Your Action Plan   Instruction Review Code 1- Verbalizes Understanding   Class Start Time 1145   Class Stop Time 1230   Class Time Calculation (min) 45 min    Row Name 04/23/23 1300     Education   Cardiac Education Topics Pritikin  Special educational needs teacher and Snacks   Instruction Review Code 1- Verbalizes Understanding   Class Start Time 1143   Class Stop Time 1215   Class Time Calculation (min) 32 min    Row Name 04/25/23 1400     Education   Cardiac Education Topics Pritikin    Select Core Videos     Core Videos   Educator Exercise Physiologist   Select Nutrition   Nutrition Nutrition Action Plan   Instruction Review Code 1- Verbalizes Understanding   Class Start Time 1145   Class Stop Time 1218   Class Time Calculation (min) 33 min    Row Name 04/28/23 1200     Education   Cardiac Education Topics Pritikin   Nurse, children's   Educator Dietitian   Select Nutrition   Nutrition Calorie Density   Instruction Review Code 1- Verbalizes Understanding   Class Start Time 1145   Class Stop Time 1223   Class Time Calculation (min) 38 min            Core Videos: Exercise    Move It!  Clinical staff conducted group or individual video education with verbal and written material and guidebook.  Patient learns the recommended Pritikin exercise program. Exercise with the goal of living a long, healthy life. Some of the health benefits of exercise include controlled diabetes, healthier blood pressure levels, improved cholesterol levels, improved heart and lung capacity, improved sleep, and better body composition. Everyone should speak with their doctor before starting or changing an exercise routine.  Biomechanical Limitations Clinical staff conducted group or individual video education with verbal and written material and guidebook.  Patient learns how biomechanical limitations can impact exercise and how we can mitigate and possibly overcome limitations to have an impactful and balanced exercise routine.  Body Composition Clinical staff conducted group or individual video education with verbal and written material and guidebook.  Patient learns that body composition (ratio of muscle mass to fat mass) is a key component to assessing overall fitness, rather than body weight alone. Increased fat mass, especially visceral belly fat, can put [place] at increased risk for metabolic syndrome, type 2 diabetes, heart disease, and even death. It is  recommended to combine diet and exercise (cardiovascular and resistance training) to improve your body composition. Seek guidance from your physician and exercise physiologist before implementing an exercise routine.  Exercise Action Plan Clinical staff conducted group or individual video education with verbal and written material and guidebook.  Patient learns the recommended strategies to achieve and enjoy long-term exercise adherence, including variety, self-motivation, self-efficacy, and positive decision making. Benefits of exercise include fitness, good health, weight management, more energy, better sleep, less stress, and overall well-being.  Medical   Heart Disease Risk Reduction Clinical staff conducted group or individual video education with verbal and written material and guidebook.  Patient learns our heart is our most vital organ as it circulates oxygen, nutrients, white blood cells, and hormones throughout the entire body, and carries waste away. Data supports a plant-based eating plan like the Pritikin Program for its effectiveness in slowing progression of and reversing heart disease. The video provides a number of recommendations to address heart disease.   Metabolic Syndrome and Belly Fat  Clinical staff conducted group or individual video education with verbal and written material and guidebook.  Patient learns what metabolic syndrome is,  how it leads to heart disease, and how one can reverse it and keep it from coming back. You have metabolic syndrome if you have 3 of the following 5 criteria: abdominal obesity, high blood pressure, high triglycerides, low HDL cholesterol, and high blood sugar.  Hypertension and Heart Disease Clinical staff conducted group or individual video education with verbal and written material and guidebook.  Patient learns that high blood pressure, or hypertension, is very common in the [place]. Hypertension is largely due to excessive salt  intake, but other important risk factors include being overweight, physical inactivity, drinking too much alcohol, smoking, and not eating enough potassium from fruits and vegetables. High blood pressure is a leading risk factor for heart attack, stroke, congestive heart failure, dementia, kidney failure, and premature death. Long-term effects of excessive salt intake include stiffening of the arteries and thickening of heart muscle and organ damage. Recommendations include ways to reduce hypertension and the risk of heart disease.  Diseases of Our Time - Focusing on Diabetes Clinical staff conducted group or individual video education with verbal and written material and guidebook.  Patient learns why the best way to stop diseases of our time is prevention, through food and other lifestyle changes. Medicine (such as prescription pills and surgeries) is often only a Band-Aid on the problem, not a long-term solution. Most common diseases of our time include obesity, type 2 diabetes, hypertension, heart disease, and cancer. The Pritikin Program is recommended and has been proven to help reduce, reverse, and/or prevent the damaging effects of metabolic syndrome.  Nutrition   Overview of the Pritikin Eating Plan  Clinical staff conducted group or individual video education with verbal and written material and guidebook.  Patient learns about the Pritikin Eating Plan for disease risk reduction. The Pritikin Eating Plan emphasizes a wide variety of unrefined, minimally-processed carbohydrates, like fruits, vegetables, whole grains, and legumes. Go, Caution, and Stop food choices are explained. Plant-based and lean animal proteins are emphasized. Rationale provided for low sodium intake for blood pressure control, low added sugars for blood sugar stabilization, and low added fats and oils for coronary artery disease risk reduction and weight management.  Calorie Density  Clinical staff conducted group or  individual video education with verbal and written material and guidebook.  Patient learns about calorie density and how it impacts the Pritikin Eating Plan. Knowing the characteristics of the food you choose will help you decide whether those foods will lead to weight gain or weight loss, and whether you want to consume more or less of them. Weight loss is usually a side effect of the Pritikin Eating Plan because of its focus on low calorie-dense foods.  Label Reading  Clinical staff conducted group or individual video education with verbal and written material and guidebook.  Patient learns about the Pritikin recommended label reading guidelines and corresponding recommendations regarding calorie density, added sugars, sodium content, and whole grains.  Dining Out - Part 1  Clinical staff conducted group or individual video education with verbal and written material and guidebook.  Patient learns that restaurant meals can be sabotaging because they can be so high in calories, fat, sodium, and/or sugar. Patient learns recommended strategies on how to positively address this and avoid unhealthy pitfalls.  Facts on Fats  Clinical staff conducted group or individual video education with verbal and written material and guidebook.  Patient learns that lifestyle modifications can be just as effective, if not more so, as many medications for lowering your risk  of heart disease. A Pritikin lifestyle can help to reduce your risk of inflammation and atherosclerosis (cholesterol build-up, or plaque, in the artery walls). Lifestyle interventions such as dietary choices and physical activity address the cause of atherosclerosis. A review of the types of fats and their impact on blood cholesterol levels, along with dietary recommendations to reduce fat intake is also included.  Nutrition Action Plan  Clinical staff conducted group or individual video education with verbal and written material and guidebook.   Patient learns how to incorporate Pritikin recommendations into their lifestyle. Recommendations include planning and keeping personal health goals in mind as an important part of their success.  Healthy Mind-Set    Healthy Minds, Bodies, Hearts  Clinical staff conducted group or individual video education with verbal and written material and guidebook.  Patient learns how to identify when they are stressed. Video will discuss the impact of that stress, as well as the many benefits of stress management. Patient will also be introduced to stress management techniques. The way we think, act, and feel has an impact on our hearts.  How Our Thoughts Can Heal Our Hearts  Clinical staff conducted group or individual video education with verbal and written material and guidebook.  Patient learns that negative thoughts can cause depression and anxiety. This can result in negative lifestyle behavior and serious health problems. Cognitive behavioral therapy is an effective method to help control our thoughts in order to change and improve our emotional outlook.  Additional Videos:  Exercise    Improving Performance  Clinical staff conducted group or individual video education with verbal and written material and guidebook.  Patient learns to use a non-linear approach by alternating intensity levels and lengths of time spent exercising to help burn more calories and lose more body fat. Cardiovascular exercise helps improve heart health, metabolism, hormonal balance, blood sugar control, and recovery from fatigue. Resistance training improves strength, endurance, balance, coordination, reaction time, metabolism, and muscle mass. Flexibility exercise improves circulation, posture, and balance. Seek guidance from your physician and exercise physiologist before implementing an exercise routine and learn your capabilities and proper form for all exercise.  Introduction to Yoga  Clinical staff conducted group or  individual video education with verbal and written material and guidebook.  Patient learns about yoga, a discipline of the coming together of mind, breath, and body. The benefits of yoga include improved flexibility, improved range of motion, better posture and core strength, increased lung function, weight loss, and positive self-image. Yoga's heart health benefits include lowered blood pressure, healthier heart rate, decreased cholesterol and triglyceride levels, improved immune function, and reduced stress. Seek guidance from your physician and exercise physiologist before implementing an exercise routine and learn your capabilities and proper form for all exercise.  Medical   Aging: Enhancing Your Quality of Life  Clinical staff conducted group or individual video education with verbal and written material and guidebook.  Patient learns key strategies and recommendations to stay in good physical health and enhance quality of life, such as prevention strategies, having an advocate, securing a Health Care Proxy and Power of Attorney, and keeping a list of medications and system for tracking them. It also discusses how to avoid risk for bone loss.  Biology of Weight Control  Clinical staff conducted group or individual video education with verbal and written material and guidebook.  Patient learns that weight gain occurs because we consume more calories than we burn (eating more, moving less). Even if your body weight is  normal, you may have higher ratios of fat compared to muscle mass. Too much body fat puts you at increased risk for cardiovascular disease, heart attack, stroke, type 2 diabetes, and obesity-related cancers. In addition to exercise, following the Pritikin Eating Plan can help reduce your risk.  Decoding Lab Results  Clinical staff conducted group or individual video education with verbal and written material and guidebook.  Patient learns that lab test reflects one measurement whose  values change over time and are influenced by many factors, including medication, stress, sleep, exercise, food, hydration, pre-existing medical conditions, and more. It is recommended to use the knowledge from this video to become more involved with your lab results and evaluate your numbers to speak with your doctor.   Diseases of Our Time - Overview  Clinical staff conducted group or individual video education with verbal and written material and guidebook.  Patient learns that according to the CDC, 50% to 70% of chronic diseases (such as obesity, type 2 diabetes, elevated lipids, hypertension, and heart disease) are avoidable through lifestyle improvements including healthier food choices, listening to satiety cues, and increased physical activity.  Sleep Disorders Clinical staff conducted group or individual video education with verbal and written material and guidebook.  Patient learns how good quality and duration of sleep are important to overall health and well-being. Patient also learns about sleep disorders and how they impact health along with recommendations to address them, including discussing with a physician.  Nutrition  Dining Out - Part 2 Clinical staff conducted group or individual video education with verbal and written material and guidebook.  Patient learns how to plan ahead and communicate in order to maximize their dining experience in a healthy and nutritious manner. Included are recommended food choices based on the type of restaurant the patient is visiting.   Fueling a Banker conducted group or individual video education with verbal and written material and guidebook.  There is a strong connection between our food choices and our health. Diseases like obesity and type 2 diabetes are very prevalent and are in large-part due to lifestyle choices. The Pritikin Eating Plan provides plenty of food and hunger-curbing satisfaction. It is easy to follow,  affordable, and helps reduce health risks.  Menu Workshop  Clinical staff conducted group or individual video education with verbal and written material and guidebook.  Patient learns that restaurant meals can sabotage health goals because they are often packed with calories, fat, sodium, and sugar. Recommendations include strategies to plan ahead and to communicate with the manager, chef, or server to help order a healthier meal.  Planning Your Eating Strategy  Clinical staff conducted group or individual video education with verbal and written material and guidebook.  Patient learns about the Pritikin Eating Plan and its benefit of reducing the risk of disease. The Pritikin Eating Plan does not focus on calories. Instead, it emphasizes high-quality, nutrient-rich foods. By knowing the characteristics of the foods, we choose, we can determine their calorie density and make informed decisions.  Targeting Your Nutrition Priorities  Clinical staff conducted group or individual video education with verbal and written material and guidebook.  Patient learns that lifestyle habits have a tremendous impact on disease risk and progression. This video provides eating and physical activity recommendations based on your personal health goals, such as reducing LDL cholesterol, losing weight, preventing or controlling type 2 diabetes, and reducing high blood pressure.  Vitamins and Minerals  Clinical staff conducted group or individual video  education with verbal and written material and guidebook.  Patient learns different ways to obtain key vitamins and minerals, including through a recommended healthy diet. It is important to discuss all supplements you take with your doctor.   Healthy Mind-Set    Smoking Cessation  Clinical staff conducted group or individual video education with verbal and written material and guidebook.  Patient learns that cigarette smoking and tobacco addiction pose a serious health  risk which affects millions of people. Stopping smoking will significantly reduce the risk of heart disease, lung disease, and many forms of cancer. Recommended strategies for quitting are covered, including working with your doctor to develop a successful plan.  Culinary   Becoming a Set designer conducted group or individual video education with verbal and written material and guidebook.  Patient learns that cooking at home can be healthy, cost-effective, quick, and puts them in control. Keys to cooking healthy recipes will include looking at your recipe, assessing your equipment needs, planning ahead, making it simple, choosing cost-effective seasonal ingredients, and limiting the use of added fats, salts, and sugars.  Cooking - Breakfast and Snacks  Clinical staff conducted group or individual video education with verbal and written material and guidebook.  Patient learns how important breakfast is to satiety and nutrition through the entire day. Recommendations include key foods to eat during breakfast to help stabilize blood sugar levels and to prevent overeating at meals later in the day. Planning ahead is also a key component.  Cooking - Educational psychologist conducted group or individual video education with verbal and written material and guidebook.  Patient learns eating strategies to improve overall health, including an approach to cook more at home. Recommendations include thinking of animal protein as a side on your plate rather than center stage and focusing instead on lower calorie dense options like vegetables, fruits, whole grains, and plant-based proteins, such as beans. Making sauces in large quantities to freeze for later and leaving the skin on your vegetables are also recommended to maximize your experience.  Cooking - Healthy Salads and Dressing Clinical staff conducted group or individual video education with verbal and written material and  guidebook.  Patient learns that vegetables, fruits, whole grains, and legumes are the foundations of the Pritikin Eating Plan. Recommendations include how to incorporate each of these in flavorful and healthy salads, and how to create homemade salad dressings. Proper handling of ingredients is also covered. Cooking - Soups and [employer] - Soups and Desserts Clinical staff conducted group or individual video education with verbal and written material and guidebook.  Patient learns that Pritikin soups and desserts make for easy, nutritious, and delicious snacks and meal components that are low in sodium, fat, sugar, and calorie density, while high in vitamins, minerals, and filling fiber. Recommendations include simple and healthy ideas for soups and desserts.   Overview     The Pritikin Solution Program Overview Clinical staff conducted group or individual video education with verbal and written material and guidebook.  Patient learns that the results of the Pritikin Program have been documented in more than 100 articles published in peer-reviewed journals, and the benefits include reducing risk factors for (and, in some cases, even reversing) high cholesterol, high blood pressure, type 2 diabetes, obesity, and more! An overview of the three key pillars of the Pritikin Program will be covered: eating well, doing regular exercise, and having a healthy mind-set.  WORKSHOPS  Exercise: Exercise Basics: Building  Your Action Plan Clinical staff led group instruction and group discussion with PowerPoint presentation and patient guidebook. To enhance the learning environment the use of posters, models and videos may be added. At the conclusion of this workshop, patients will comprehend the difference between physical activity and exercise, as well as the benefits of incorporating both, into their routine. Patients will understand the FITT (Frequency, Intensity, Time, and Type) principle and how to  use it to build an exercise action plan. In addition, safety concerns and other considerations for exercise and cardiac rehab will be addressed by the presenter. The purpose of this lesson is to promote a comprehensive and effective weekly exercise routine in order to improve patients' overall level of fitness.   Managing Heart Disease: Your Path to a Healthier Heart Clinical staff led group instruction and group discussion with PowerPoint presentation and patient guidebook. To enhance the learning environment the use of posters, models and videos may be added.At the conclusion of this workshop, patients will understand the anatomy and physiology of the heart. Additionally, they will understand how Pritikin's three pillars impact the risk factors, the progression, and the management of heart disease.  The purpose of this lesson is to provide a high-level overview of the heart, heart disease, and how the Pritikin lifestyle positively impacts risk factors.  Exercise Biomechanics Clinical staff led group instruction and group discussion with PowerPoint presentation and patient guidebook. To enhance the learning environment the use of posters, models and videos may be added. Patients will learn how the structural parts of their bodies function and how these functions impact their daily activities, movement, and exercise. Patients will learn how to promote a neutral spine, learn how to manage pain, and identify ways to improve their physical movement in order to promote healthy living. The purpose of this lesson is to expose patients to common physical limitations that impact physical activity. Participants will learn practical ways to adapt and manage aches and pains, and to minimize their effect on regular exercise. Patients will learn how to maintain good posture while sitting, walking, and lifting.  Balance Training and Fall Prevention  Clinical staff led group instruction and group discussion  with PowerPoint presentation and patient guidebook. To enhance the learning environment the use of posters, models and videos may be added. At the conclusion of this workshop, patients will understand the importance of their sensorimotor skills (vision, proprioception, and the vestibular system) in maintaining their ability to balance as they age. Patients will apply a variety of balancing exercises that are appropriate for their current level of function. Patients will understand the common causes for poor balance, possible solutions to these problems, and ways to modify their physical environment in order to minimize their fall risk. The purpose of this lesson is to teach patients about the importance of maintaining balance as they age and ways to minimize their risk of falling.  WORKSHOPS   Nutrition:  Fueling a Ship broker led group instruction and group discussion with PowerPoint presentation and patient guidebook. To enhance the learning environment the use of posters, models and videos may be added. Patients will review the foundational principles of the Pritikin Eating Plan and understand what constitutes a serving size in each of the food groups. Patients will also learn Pritikin-friendly foods that are better choices when away from home and review make-ahead meal and snack options. Calorie density will be reviewed and applied to three nutrition priorities: weight maintenance, weight loss, and weight gain. The purpose of  this lesson is to reinforce (in a group setting) the key concepts around what patients are recommended to eat and how to apply these guidelines when away from home by planning and selecting Pritikin-friendly options. Patients will understand how calorie density may be adjusted for different weight management goals.  Mindful Eating  Clinical staff led group instruction and group discussion with PowerPoint presentation and patient guidebook. To enhance the  learning environment the use of posters, models and videos may be added. Patients will briefly review the concepts of the Pritikin Eating Plan and the importance of low-calorie dense foods. The concept of mindful eating will be introduced as well as the importance of paying attention to internal hunger signals. Triggers for non-hunger eating and techniques for dealing with triggers will be explored. The purpose of this lesson is to provide patients with the opportunity to review the basic principles of the Pritikin Eating Plan, discuss the value of eating mindfully and how to measure internal cues of hunger and fullness using the Hunger Scale. Patients will also discuss reasons for non-hunger eating and learn strategies to use for controlling emotional eating.  Targeting Your Nutrition Priorities Clinical staff led group instruction and group discussion with PowerPoint presentation and patient guidebook. To enhance the learning environment the use of posters, models and videos may be added. Patients will learn how to determine their genetic susceptibility to disease by reviewing their family history. Patients will gain insight into the importance of diet as part of an overall healthy lifestyle in mitigating the impact of genetics and other environmental insults. The purpose of this lesson is to provide patients with the opportunity to assess their personal nutrition priorities by looking at their family history, their own health history and current risk factors. Patients will also be able to discuss ways of prioritizing and modifying the Pritikin Eating Plan for their highest risk areas  Menu  Clinical staff led group instruction and group discussion with PowerPoint presentation and patient guidebook. To enhance the learning environment the use of posters, models and videos may be added. Using menus brought in from [employer], or printed from [employer], patients will apply the Pritikin dining out  guidelines that were presented in the [employer] video. Patients will also be able to practice these guidelines in a variety of provided scenarios. The purpose of this lesson is to provide patients with the opportunity to practice hands-on learning of the Pritikin Dining Out guidelines with actual menus and practice scenarios.  Label Reading Clinical staff led group instruction and group discussion with PowerPoint presentation and patient guidebook. To enhance the learning environment the use of posters, models and videos may be added. Patients will review and discuss the Pritikin label reading guidelines presented in Pritikin's Label Reading Educational series video. Using fool labels brought in from local grocery stores and markets, patients will apply the label reading guidelines and determine if the packaged food meet the Pritikin guidelines. The purpose of this lesson is to provide patients with the opportunity to review, discuss, and practice hands-on learning of the Pritikin Label Reading guidelines with actual packaged food labels. Cooking School  Pritikin's [employer] are designed to teach patients ways to prepare quick, simple, and affordable recipes at home. The importance of nutrition's role in chronic disease risk reduction is reflected in its emphasis in the overall Pritikin program. By learning how to prepare essential core Pritikin Eating Plan recipes, patients will increase control over what they eat; be able to  customize the flavor of foods without the use of added salt, sugar, or fat; and improve the quality of the food they consume. By learning a set of core recipes which are easily assembled, quickly prepared, and affordable, patients are more likely to prepare more healthy foods at home. These workshops focus on convenient breakfasts, simple entres, side dishes, and desserts which can be prepared with minimal effort and are consistent with nutrition  recommendations for cardiovascular risk reduction. Cooking [employer] are taught by a Armed forces logistics/support/administrative officer (RD) who has been trained by the [employer]. The chef or RD has a clear understanding of the importance of minimizing - if not completely eliminating - added fat, sugar, and sodium in recipes. Throughout the series of Cooking School Workshop sessions, patients will learn about healthy ingredients and efficient methods of cooking to build confidence in their capability to prepare    Cooking School weekly topics:  Adding Flavor- Sodium-Free  Fast and Healthy Breakfasts  Powerhouse Plant-Based Proteins  Satisfying Salads and Dressings  Simple Sides and Sauces  International Cuisine-Spotlight on the [employer] Zones  Delicious Desserts  Savory Soups  [employer] - Meals in a Astronomer Appetizers and Snacks  Comforting Weekend Breakfasts  One-Pot Wonders   Fast Evening Meals  Landscape architect Your Pritikin Plate  WORKSHOPS   Healthy Mindset (Psychosocial):  Focused Goals, Sustainable Changes Clinical staff led group instruction and group discussion with PowerPoint presentation and patient guidebook. To enhance the learning environment the use of posters, models and videos may be added. Patients will be able to apply effective goal setting strategies to establish at least one personal goal, and then take consistent, meaningful action toward that goal. They will learn to identify common barriers to achieving personal goals and develop strategies to overcome them. Patients will also gain an understanding of how our mind-set can impact our ability to achieve goals and the importance of cultivating a positive and growth-oriented mind-set. The purpose of this lesson is to provide patients with a deeper understanding of how to set and achieve personal goals, as well as the tools and strategies needed to overcome common obstacles which may arise along  the way.  From Head to Heart: The Power of a Healthy Outlook  Clinical staff led group instruction and group discussion with PowerPoint presentation and patient guidebook. To enhance the learning environment the use of posters, models and videos may be added. Patients will be able to recognize and describe the impact of emotions and mood on physical health. They will discover the importance of self-care and explore self-care practices which may work for them. Patients will also learn how to utilize the 4 C's to cultivate a healthier outlook and better manage stress and challenges. The purpose of this lesson is to demonstrate to patients how a healthy outlook is an essential part of maintaining good health, especially as they continue their cardiac rehab journey.  Healthy Sleep for a Healthy Heart Clinical staff led group instruction and group discussion with PowerPoint presentation and patient guidebook. To enhance the learning environment the use of posters, models and videos may be added. At the conclusion of this workshop, patients will be able to demonstrate knowledge of the importance of sleep to overall health, well-being, and quality of life. They will understand the symptoms of, and treatments for, common sleep disorders. Patients will also be able to identify daytime and nighttime behaviors which impact sleep, and they will be able to  apply these tools to help manage sleep-related challenges. The purpose of this lesson is to provide patients with a general overview of sleep and outline the importance of quality sleep. Patients will learn about a few of the most common sleep disorders. Patients will also be introduced to the concept of "sleep hygiene," and discover ways to self-manage certain sleeping problems through simple daily behavior changes. Finally, the workshop will motivate patients by clarifying the links between quality sleep and their goals of heart-healthy living.   Recognizing and  Reducing Stress Clinical staff led group instruction and group discussion with PowerPoint presentation and patient guidebook. To enhance the learning environment the use of posters, models and videos may be added. At the conclusion of this workshop, patients will be able to understand the types of stress reactions, differentiate between acute and chronic stress, and recognize the impact that chronic stress has on their health. They will also be able to apply different coping mechanisms, such as reframing negative self-talk. Patients will have the opportunity to practice a variety of stress management techniques, such as deep abdominal breathing, progressive muscle relaxation, and/or guided imagery.  The purpose of this lesson is to educate patients on the role of stress in their lives and to provide healthy techniques for coping with it.  Learning Barriers/Preferences:  Learning Barriers/Preferences - 03/04/23 1219       Learning Barriers/Preferences   Learning Barriers Sight;Hearing   wears glasses, has hearing aides both ears   Learning Preferences Pictoral;Skilled Demonstration;Written Material             Education Topics:  Knowledge Questionnaire Score:  Knowledge Questionnaire Score - 03/04/23 1344       Knowledge Questionnaire Score   Pre Score 21/24             Core Components/Risk Factors/Patient Goals at Admission:  Personal Goals and Risk Factors at Admission - 03/04/23 0920       Core Components/Risk Factors/Patient Goals on Admission    Weight Management Obesity;Yes    Intervention Weight Management/Obesity: Establish reasonable short term and long term weight goals.;Obesity: Provide education and appropriate resources to help participant work on and attain dietary goals.    Admit Weight 273 lb 9.5 oz (124.1 kg)    Expected Outcomes Short Term: Continue to assess and modify interventions until short term weight is achieved;Long Term: Adherence to nutrition and  physical activity/exercise program aimed toward attainment of established weight goal;Weight Loss: Understanding of general recommendations for a balanced deficit meal plan, which promotes 1-2 lb weight loss per week and includes a negative energy balance of [phone] kcal/d    Hypertension Yes    Intervention Provide education on lifestyle modifcations including regular physical activity/exercise, weight management, moderate sodium restriction and increased consumption of fresh fruit, vegetables, and low fat dairy, alcohol moderation, and smoking cessation.;Monitor prescription use compliance.    Expected Outcomes Short Term: Continued assessment and intervention until BP is < 140/13mm HG in hypertensive participants. < 130/46mm HG in hypertensive participants with diabetes, heart failure or chronic kidney disease.;Long Term: Maintenance of blood pressure at goal levels.    Lipids Yes    Intervention Provide education and support for participant on nutrition & aerobic/resistive exercise along with prescribed medications to achieve LDL 70mg , HDL >40mg .    Expected Outcomes Short Term: Participant states understanding of desired cholesterol values and is compliant with medications prescribed. Participant is following exercise prescription and nutrition guidelines.;Long Term: Cholesterol controlled with medications as prescribed, with individualized exercise  RX and with personalized nutrition plan. Value goals: LDL < 70mg , HDL > 40 mg.             Core Components/Risk Factors/Patient Goals Review:   Goals and Risk Factor Review     Row Name 03/10/23 1105 04/01/23 1017 04/29/23 1358         Core Components/Risk Factors/Patient Goals Review   Personal Goals Review Weight Management/Obesity;Hypertension;Lipids Weight Management/Obesity;Hypertension;Lipids Weight Management/Obesity;Hypertension;Lipids     Review Steven Munoz started ICR on 03/10/2023 and did well with exercise. VSS. Pt has frequent PVCs:  asymptomtic. MD aware. Steven Munoz continues to do well with exercise at intensive cardiac rehab. Vital signs have been Munoz. Steven Munoz has lost 5.1 kg since starting the program Steven Munoz continues to do well with exercise at intensive cardiac rehab. Vital signs have been Munoz. Steven Munoz has lost 8.5  kg since starting the program. Steven Munoz will complete intensive cardiac rehab on 05/16/23     Expected Outcomes Steven Munoz will continue to particiapte in ICR for exercise, nurttrion, and life-styel modifications. Steven Munoz will continue to particiapte in ICR for exercise, nurttrion, and life-style modifications. Steven Munoz will continue to particiapte in ICR for exercise, nurttrion, and life-style modifications.              Core Components/Risk Factors/Patient Goals at Discharge (Final Review):   Goals and Risk Factor Review - 04/29/23 1358       Core Components/Risk Factors/Patient Goals Review   Personal Goals Review Weight Management/Obesity;Hypertension;Lipids    Review Steven Munoz continues to do well with exercise at intensive cardiac rehab. Vital signs have been Munoz. Steven Munoz has lost 8.5  kg since starting the program. Steven Munoz will complete intensive cardiac rehab on 05/16/23    Expected Outcomes Steven Munoz will continue to particiapte in ICR for exercise, nurttrion, and life-style modifications.             ITP Comments:  ITP Comments     Row Name 03/04/23 0908 03/10/23 1103 04/01/23 1007 04/29/23 1356     ITP Comments Medical Director- Dr. Armanda Magic, MD. Patient oriented to Pritikin Education / Intensive Cardiac Rehab Program. Initial orientation packet reviewed with patient. 30-day ITP review. Steven Munoz started [employer] on 03/10/2023 and did well with exercise. 30-day ITP review. Steven Munoz has good attendance and participation in intensive cardiac rehab 30-day ITP review. Steven Munoz continues to have good attendance and participation in intensive cardiac rehab. Steven Munoz will complete intensive cardiac rehab on 05/16/23             Comments: See  ITP Comments

## 2023-04-30 ENCOUNTER — Encounter (HOSPITAL_COMMUNITY)
Admission: RE | Admit: 2023-04-30 | Discharge: 2023-04-30 | Disposition: A | Discharge status: Home / Self Care | Payer: Medicare HMO | Source: Ambulatory Visit | Attending: Cardiovascular Disease | Admitting: Cardiovascular Disease

## 2023-04-30 DIAGNOSIS — I213 ST elevation (STEMI) myocardial infarction of unspecified site: Secondary | ICD-10-CM | POA: Diagnosis not present

## 2023-05-02 ENCOUNTER — Encounter (HOSPITAL_COMMUNITY)
Admission: RE | Admit: 2023-05-02 | Discharge: 2023-05-02 | Disposition: A | Discharge status: Home / Self Care | Payer: Medicare HMO | Source: Ambulatory Visit | Attending: Cardiovascular Disease | Admitting: Cardiovascular Disease

## 2023-05-02 DIAGNOSIS — I213 ST elevation (STEMI) myocardial infarction of unspecified site: Secondary | ICD-10-CM

## 2023-05-05 ENCOUNTER — Encounter (HOSPITAL_COMMUNITY)
Admission: RE | Admit: 2023-05-05 | Discharge: 2023-05-05 | Disposition: A | Discharge status: Home / Self Care | Payer: Medicare HMO | Source: Ambulatory Visit | Attending: Cardiovascular Disease | Admitting: Cardiovascular Disease

## 2023-05-05 DIAGNOSIS — I213 ST elevation (STEMI) myocardial infarction of unspecified site: Secondary | ICD-10-CM

## 2023-05-07 ENCOUNTER — Encounter (HOSPITAL_COMMUNITY)
Admission: RE | Admit: 2023-05-07 | Discharge: 2023-05-07 | Disposition: A | Discharge status: Home / Self Care | Payer: Medicare HMO | Source: Ambulatory Visit | Attending: Cardiovascular Disease | Admitting: Cardiovascular Disease

## 2023-05-07 DIAGNOSIS — I213 ST elevation (STEMI) myocardial infarction of unspecified site: Secondary | ICD-10-CM

## 2023-05-09 ENCOUNTER — Encounter (HOSPITAL_COMMUNITY): Admission: RE | Admit: 2023-05-09 | Payer: Medicare HMO | Source: Ambulatory Visit

## 2023-05-09 VITALS — Ht 71.25 in | Wt 254.9 lb

## 2023-05-09 DIAGNOSIS — I213 ST elevation (STEMI) myocardial infarction of unspecified site: Secondary | ICD-10-CM | POA: Diagnosis not present

## 2023-05-12 ENCOUNTER — Encounter (HOSPITAL_COMMUNITY)
Admission: RE | Admit: 2023-05-12 | Discharge: 2023-05-12 | Disposition: A | Discharge status: Home / Self Care | Payer: Medicare HMO | Source: Ambulatory Visit | Attending: Cardiovascular Disease | Admitting: Cardiovascular Disease

## 2023-05-12 DIAGNOSIS — I213 ST elevation (STEMI) myocardial infarction of unspecified site: Secondary | ICD-10-CM

## 2023-05-14 ENCOUNTER — Encounter (HOSPITAL_COMMUNITY)
Admission: RE | Admit: 2023-05-14 | Discharge: 2023-05-14 | Disposition: A | Discharge status: Home / Self Care | Payer: Medicare HMO | Source: Ambulatory Visit | Attending: Cardiovascular Disease | Admitting: Cardiovascular Disease

## 2023-05-14 DIAGNOSIS — I213 ST elevation (STEMI) myocardial infarction of unspecified site: Secondary | ICD-10-CM

## 2023-05-14 NOTE — Progress Notes (Addendum)
Discharge Progress Report  Patient Details  Name: Steven Munoz MRN: [id number] Date of Birth: [birth year] Referring Provider:   Flowsheet Row INTENSIVE CARDIAC REHAB ORIENT from 03/04/2023 in [facility] Referring Provider Wendall Stade, MD     Number of Visits: 82  Reason for Discharge:  Patient reached a stable level of exercise. Patient independent in their exercise. Patient has met program and personal goals.  Smoking History:  Social History  Tobacco Use Smoking Status Former  Current packs/day: 0.00  Average packs/day: 1 pack/day for 6.0 years (6.0 ttl pk-yrs)  Types: Cigarettes  Start date: 10/28/1960  Quit date: 10/28/1966  Years since quitting: 56.6 Smokeless Tobacco Never Tobacco Comments smoked 1962- 1968, up to < 1 ppd   Diagnosis:  01/25/23 STEMI  ADL [facility]:   Initial Exercise Prescription: Initial Exercise Prescription - 03/04/23 1200  Date of Initial Exercise RX and Referring Provider Date 03/04/23  Referring Provider Wendall Stade, MD  Expected Discharge Date 05/16/23  Treadmill MPH 1.7  Grade 0  Minutes 15  METs 2.3  T5 Nustep Level 3  SPM 85  Minutes 15  METs 2.3  Prescription Details Frequency (times per week) 3  Duration Progress to 30 minutes of continuous aerobic without signs/symptoms of physical distress  Intensity THRR 40-80% of Max Heartrate 55-110  Ratings of Perceived Exertion 11-13  Perceived Dyspnea 0-4  Progression Progression Continue to progress workloads to maintain intensity without signs/symptoms of physical distress.  Resistance Training Training Prescription Yes  Weight 3 lbs  Reps 10-15     Discharge Exercise Prescription (Final Exercise Prescription Changes): Exercise Prescription Changes - 05/21/23 1012  Response to Exercise Blood Pressure (Admit) 108/72  Blood Pressure (Exercise) 138/82  Blood Pressure  (Exit) 118/62  Heart Rate (Admit) 66 bpm  Heart Rate (Exercise) 113 bpm  Heart Rate (Exit) 64 bpm  Rating of Perceived Exertion (Exercise) 12  Symptoms None  Comments Bill completed the cardiac rehab program today.  Duration Continue with 30 min of aerobic exercise without signs/symptoms of physical distress.  Intensity THRR unchanged  Progression Progression Continue to progress workloads to maintain intensity without signs/symptoms of physical distress.  Average METs 3.5  Resistance Training Training Prescription No   Relaxation day, no weights. Interval Training Interval Training No  Treadmill MPH 2.5  Grade 0  Minutes 15  METs 2.91  T5 Nustep Level 4  SPM 138  Minutes 15  METs 4.2  Home Exercise Plan Plans to continue exercise at Home (comment)   Recumbent bike at home Frequency Add 3 additional days to program exercise sessions.  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 -- Resting HR 86 bpm 65 bpm Resting BP 102/52 116/54 Resting Oxygen Saturation  98 % -- Exercise Oxygen Saturation  during 6 min walk 96 % -- Max Ex. HR 93  bpm 119 bpm Max Ex. 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 -- Resting HR 86 bpm 65 bpm Resting BP 102/52 116/54 Resting Oxygen Saturation  98 % -- Exercise Oxygen Saturation  during 6 min walk 96 % -- Max Ex. HR 93 bpm 119 bpm Max Ex. BP 152/62 154/62 2 Minute Post BP 122/52 94/62  asymptomatic    Psychological, QOL, Others - Outcomes: PHQ 2/9:   05/07/2023    4:46 PM 03/04/2023    1:56 PM 12/16/2022   10:23 AM 11/25/2022    3:09 PM 08/14/2022    9:50 AM Depression screen PHQ 2/9 Decreased Interest 0 0 0 0 0 Down, Depressed, Hopeless 0 0 0 0 0 PHQ - 2 Score 0 0 0 0 0 Altered sleeping 0 0 Tired, decreased energy 0 0 Change in appetite 0 0 Feeling bad or failure about yourself  0 0 Trouble concentrating 0 0 Moving slowly or fidgety/restless 0 0 Suicidal thoughts 0 0 PHQ-9 Score 0 0   Quality of Life: Quality of Life - 05/07/23 1645  Quality of Life Select Quality of Life  Quality of Life Scores Health/Function Pre 29.6 %  Health/Function Post 28.71 %  Health/Function % Change -3.01 %  Socioeconomic Pre 30 %  Socioeconomic Post 30 %  Socioeconomic % Change  0 %  Psych/Spiritual Pre 25.71 %  Psych/Spiritual Post 29.14 %  Psych/Spiritual % Change 13.34 %  Family Pre 30 %  Family Post 26.4 %  Family % Change -12 %  GLOBAL Pre 28.94 %  GLOBAL Post 28.73 %  GLOBAL % Change -0.73 %     Personal Goals: Goals established at orientation with interventions provided to work toward goal. Personal Goals and Risk Factors at Admission - 03/04/23 0920  Core Components/Risk Factors/Patient  Goals on Admission  Weight Management Obesity;Yes  Intervention Weight Management/Obesity: Establish reasonable short term and long term weight goals.;Obesity: Provide education and appropriate resources to help participant work on and attain dietary goals.  Admit Weight 273 lb 9.5 oz (124.1 kg)  Expected Outcomes Short Term: Continue to assess and modify interventions until short term weight is achieved;Long Term: Adherence to nutrition and physical activity/exercise program aimed toward attainment of established weight goal;Weight Loss: Understanding of general recommendations for a balanced deficit meal plan, which promotes 1-2 lb weight loss per week and includes a negative energy balance of [phone] kcal/d  Hypertension Yes  Intervention Provide education on lifestyle modifcations including regular physical activity/exercise, weight management, moderate sodium restriction and increased consumption of fresh fruit, vegetables, and low fat dairy, alcohol moderation, and smoking cessation.;Monitor prescription use compliance.  Expected Outcomes Short Term: Continued assessment and intervention until BP is < 140/53mm HG in hypertensive participants. < 130/36mm HG in hypertensive participants with diabetes, heart failure or chronic kidney disease.;Long Term: Maintenance of blood pressure at goal levels.  Lipids Yes  Intervention Provide education and support for participant on nutrition & aerobic/resistive exercise along with prescribed medications to achieve LDL 70mg , HDL >40mg .  Expected Outcomes Short Term: Participant states understanding of desired cholesterol values and is compliant with medications prescribed. Participant is following exercise prescription and nutrition guidelines.;Long Term: Cholesterol controlled with medications as prescribed, with individualized exercise RX and with personalized nutrition plan. Value goals: LDL < 70mg , HDL > 40 mg.      Personal Goals  Discharge: Goals and Risk Factor Review  Row Name 03/10/23 1105 04/01/23 1017 04/29/23 1358 Core Components/Risk Factors/Patient Goals Review Personal Goals Review Weight Management/Obesity;Hypertension;Lipids Weight Management/Obesity;Hypertension;Lipids Weight Management/Obesity;Hypertension;Lipids Review Bill started ICR on 03/10/2023 and did well with exercise. VSS. Pt has frequent PVCs: asymptomtic. MD aware. Annette Stable continues to do well with exercise at intensive cardiac rehab. Vital signs have been stable. Annette Stable has lost 5.1 kg since starting the program Annette Stable continues to do well with exercise at intensive cardiac rehab. Vital signs have been stable. Annette Stable has lost 8.5  kg since starting the program. Annette Stable will complete intensive cardiac rehab on 05/16/23 Expected Outcomes Annette Stable will continue to particiapte in ICR for exercise, nurttrion, and life-styel modifications. Annette Stable will continue to particiapte in ICR for exercise, nurttrion, and life-style modifications. Annette Stable will continue to particiapte in ICR for exercise, nurttrion, and life-style modifications.   Goals and Risk Factor Review  Row Name 03/10/23 1105 04/01/23 1017 04/29/23 1358 Core Components/Risk Factors/Patient Goals Review Personal Goals Review Weight Management/Obesity;Hypertension;Lipids Weight Management/Obesity;Hypertension;Lipids Weight Management/Obesity;Hypertension;Lipids Review Bill started ICR on 03/10/2023 and did well with exercise. VSS. Pt has frequent PVCs: asymptomtic. MD aware. Annette Stable continues to do well with exercise at intensive cardiac rehab. Vital signs have been stable. Annette Stable has lost 5.1 kg since starting the program Annette Stable continues to do well with exercise at intensive cardiac rehab. Vital signs have been stable. Annette Stable has lost 8.5  kg since starting the program. Annette Stable will complete intensive cardiac rehab on 05/16/23 Expected Outcomes Annette Stable will continue to particiapte in ICR for exercise, nurttrion,  and life-styel modifications. Annette Stable will continue to particiapte in ICR for exercise, nurttrion, and life-style modifications. Annette Stable will continue to particiapte in ICR for exercise, nurttrion, and life-style modifications.    Exercise Goals and Review: Exercise Goals  Row Name 03/04/23 0920 Exercise Goals Increase Physical Activity Yes Intervention Provide advice, education, support and counseling about physical activity/exercise needs.;Develop an individualized exercise prescription for aerobic and resistive training based on initial evaluation findings, risk stratification, comorbidities and participant's personal goals. Expected Outcomes Short Term: Attend rehab on a regular basis to increase amount of physical activity.;Long Term: Add in home exercise to make exercise part of routine and to increase amount of physical activity.;Long Term: Exercising regularly at least 3-5 days a week. Increase Strength and Stamina Yes Intervention Provide advice, education, support and counseling about physical activity/exercise needs.;Develop an individualized exercise prescription for aerobic and resistive training based on initial evaluation findings, risk stratification, comorbidities and participant's personal goals. Expected Outcomes Short Term: Increase workloads from initial exercise prescription for resistance, speed, and METs.;Short Term: Perform resistance training exercises routinely during rehab and add in resistance training at home;Long Term: Improve cardiorespiratory fitness, muscular endurance and strength as measured by increased METs and functional capacity ( ) Able to understand and use rate of perceived exertion (RPE) scale Yes Intervention Provide education and explanation on how to use RPE scale Expected Outcomes Short Term: Able to use RPE daily in rehab to express subjective intensity level;Long Term:  Able  to use RPE to guide intensity level when exercising  independently Knowledge and understanding of Target Heart Rate Range (THRR) Yes Intervention Provide education and explanation of THRR including how the numbers were predicted and where they are located for reference Expected Outcomes Short Term: Able to state/look up THRR;Long Term: Able to use THRR to govern intensity when exercising independently;Short Term: Able to use daily as guideline for intensity in rehab Able to check pulse independently Yes Intervention Provide education and demonstration on how to check pulse in carotid and radial arteries.;Review the importance of being able to check your own pulse for safety during independent exercise Expected Outcomes Short Term: Able to explain why pulse checking is important during independent exercise;Long Term: Able to check pulse independently and accurately Understanding of Exercise Prescription Yes Intervention Provide education, explanation, and written materials on patient's individual exercise prescription Expected Outcomes Short Term: Able to explain program exercise prescription;Long Term: Able to explain home exercise prescription to exercise independently   Exercise Goals  Row Name 03/04/23 0920 Exercise Goals Increase Physical Activity Yes Intervention Provide advice, education, support and counseling about physical activity/exercise needs.;Develop an individualized exercise prescription for aerobic and resistive training based on initial evaluation findings, risk stratification, comorbidities and participant's personal goals. Expected Outcomes Short Term: Attend rehab on a regular basis to increase amount of physical activity.;Long Term: Add in home exercise to make exercise part of routine and to increase amount of physical activity.;Long Term: Exercising regularly at least 3-5 days a week. Increase Strength and Stamina Yes Intervention Provide advice, education, support and counseling about physical  activity/exercise needs.;Develop an individualized exercise prescription for aerobic and resistive training based on initial evaluation findings, risk stratification, comorbidities and participant's personal goals. Expected Outcomes Short Term: Increase workloads from initial exercise prescription for resistance, speed, and METs.;Short Term: Perform resistance training exercises routinely during rehab and add in resistance training at home;Long Term: Improve cardiorespiratory fitness, muscular endurance and strength as measured by increased METs and functional capacity ( ) Able to understand and use rate of perceived exertion (RPE) scale Yes Intervention Provide education and explanation on how to use RPE scale Expected Outcomes Short Term: Able to use RPE daily in rehab to express subjective intensity level;Long Term:  Able to use RPE to guide intensity level when exercising independently Knowledge and understanding of Target Heart Rate Range (THRR) Yes Intervention Provide education and explanation of THRR including how the numbers were predicted and where they are located for reference Expected Outcomes Short Term: Able to state/look up THRR;Long Term: Able to use THRR to govern intensity when exercising independently;Short Term: Able to use daily as guideline for intensity in rehab Able to check pulse independently Yes Intervention Provide education and demonstration on how to check pulse in carotid and radial arteries.;Review the importance of being able to check your own pulse for safety during independent exercise Expected Outcomes Short Term: Able to explain why pulse checking is important during independent exercise;Long Term: Able to check pulse independently and accurately Understanding of Exercise Prescription Yes Intervention Provide education, explanation, and written materials on patient's individual exercise prescription Expected Outcomes Short Term: Able to explain  program exercise prescription;Long Term: Able to explain home exercise prescription to exercise independently    Exercise Goals Re-Evaluation: Exercise Goals Re-Evaluation  Row Name 03/10/23 1127 03/31/23 1102 04/07/23 1049 05/14/23 1038 05/21/23 1144 Exercise Goal Re-Evaluation Exercise Goals Review Increase Physical Activity;Able to understand and use rate of perceived exertion (RPE) scale Increase Physical Activity;Able to understand and  use rate of perceived exertion (RPE) scale;Increase Strength and Stamina Increase Physical Activity;Able to understand and use rate of perceived exertion (RPE) scale;Increase Strength and Stamina;Able to check pulse independently;Knowledge and understanding of Target Heart Rate Range (THRR);Understanding of Exercise Prescription Increase Physical Activity;Able to understand and use rate of perceived exertion (RPE) scale;Increase Strength and Stamina;Able to check pulse independently;Knowledge and understanding of Target Heart Rate Range (THRR);Understanding of Exercise Prescription Increase Physical Activity;Able to understand and use rate of perceived exertion (RPE) scale;Increase Strength and Stamina;Able to check pulse independently;Knowledge and understanding of Target Heart Rate Range (THRR);Understanding of Exercise Prescription Comments Patient able to understand and use RPE scale appropriately. Reviewed METs with patient and discussed how to increase MET level. Increased workload on NuStep and tolerated well. Will increase WL on TM next session. Reviewed exercise prescription with patient. He is riding his recumbent bike at home 15 minutes, 2-3 days/week and hand weight that he can use for his resistance training. Annette Stable was previously exercising at the gym. He has a pulse oximeter that he can use to monitor his pulse. Annette Stable will complete the cardiac rehab program on Friday and has progressed well achieving 3.5 METs with exercise. 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 He feels he's made excellent progress in the program and has lost 20 lbs. He achieved his goal of resuming weight training and exercise at the gym. He plans to ride his recumbent bike at home at least 30 minutes, 2 days/week and exercise at the gym on MWF 30 minutes on the treadmil and using the weights 3 days/week. Bill completed the cardiac rehab program and progressed well, achieving 3.5 METs with exercise. He is very appreciative of his time in the program and plans to continue exercise at local gym and riding his recumbent bike at home. Expected Outcomes Progress workloads as tolerated to help increase cardiorespiratory fitness. Increase workloads as tolerated to help increase MET level. Bill will increase his exercise duration at home from 15 minutes to 30 minutes 2-3 days/week. Annette Stable will continue exercise at least 30 minutes 5 days/week to maintain health and fitness gains. Annette Stable will continue exercise at least 30 minutes 5 days/week to maintain health and fitness gains.   Exercise Goals Re-Evaluation  Row Name 03/10/23 1127 03/31/23 1102 04/07/23 1049 05/14/23 1038 05/21/23 1144 Exercise Goal Re-Evaluation Exercise Goals Review Increase Physical Activity;Able to understand and use rate of perceived exertion (RPE) scale Increase Physical Activity;Able to understand and use rate of perceived exertion (RPE) scale;Increase Strength and Stamina Increase Physical Activity;Able to understand and use rate of perceived exertion (RPE) scale;Increase Strength and Stamina;Able to check pulse independently;Knowledge and understanding of Target Heart Rate Range (THRR);Understanding of Exercise Prescription Increase Physical Activity;Able to understand and use rate of perceived exertion (RPE) scale;Increase Strength and Stamina;Able to check pulse independently;Knowledge and understanding of Target Heart Rate Range (THRR);Understanding of Exercise  Prescription Increase Physical Activity;Able to understand and use rate of perceived exertion (RPE) scale;Increase Strength and Stamina;Able to check pulse independently;Knowledge and understanding of Target Heart Rate Range (THRR);Understanding of Exercise Prescription Comments Patient able to understand and use RPE scale appropriately. Reviewed METs with patient and discussed how to increase MET level. Increased workload on NuStep and tolerated well. Will increase WL on TM next session. Reviewed exercise prescription with patient. He is riding his recumbent bike at home 15 minutes, 2-3 days/week and hand weight that he can use for his resistance training. Annette Stable was previously exercising at the gym. He  has a pulse oximeter that he can use to monitor his pulse. Annette Stable will complete the cardiac rehab program on Friday and has progressed well achieving 3.5 METs with exercise. Walking is limited due to peripheral neuropahty in both feet. He feels he's made excellent progress in the program and has lost 20 lbs. He achieved his goal of resuming weight training and exercise at the gym. He plans to ride his recumbent bike at home at least 30 minutes, 2 days/week and exercise at the gym on MWF 30 minutes on the treadmil and using the weights 3 days/week. Bill completed the cardiac rehab program and progressed well, achieving 3.5 METs with exercise. He is very appreciative of his time in the program and plans to continue exercise at local gym and riding his recumbent bike at home. Expected Outcomes Progress workloads as tolerated to help increase cardiorespiratory fitness. Increase workloads as tolerated to help increase MET level. Bill will increase his exercise duration at home from 15 minutes to 30 minutes 2-3 days/week. Annette Stable will continue exercise at least 30 minutes 5 days/week to maintain health and fitness gains. Annette Stable will continue exercise at least 30 minutes 5 days/week to maintain health and fitness  gains.    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 Single Leg Stand 1.75 seconds    Post Biometrics - 05/21/23 0950   Post  Biometrics Height 5' 11.25" (1.81 m)  Waist Circumference 48 inches  Hip Circumference 51.5 inches  Waist to Hip Ratio 0.93 %  Triceps Skinfold 18 mm  % Body Fat 34.9 %  Grip Strength 44 kg  Flexibility 0 in   not done due to hip replacement Single Leg Stand 2 seconds     Nutrition: Nutrition Therapy & Goals - 05/09/23 1021  Nutrition Therapy Diet Heart healthy diet  Drug/Food Interactions Statins/Certain Fruits  Personal Nutrition Goals Nutrition Goal Patient to identify strategies for reducing cardiovascular risk by attending the [employer] education and nutrition series weekly.  Personal Goal #2 Patient to improve diet quality by using the plate method as a guide for meal planning to include lean protein/plant protein, fruits, vegetables, whole grains, nonfat dairy as part of a well-balanced diet.  Personal Goal #3 Patient to reduce sodium in the diet to 1500mg  per day  Comments Goals in action. Annette Stable continues to attend the [employer] and nutrition series. He continues to work on heart healthy dietary changes including increased dietary fiber and decreased saturated fat intake. He is down 18.9# (2#/week) since starting with our program. Patient will benefit from participation in intensive cardiac rehab for nutrition, exercise, and lifestyle modification.  Intervention Plan Intervention Prescribe, educate and counsel regarding individualized specific dietary modifications aiming towards targeted core components such as weight, hypertension, lipid management, diabetes, heart  failure and other comorbidities.;Nutrition handout(s) given to patient.  Expected Outcomes Short Term Goal: Understand basic principles of dietary content, such as calories, fat, sodium, cholesterol and nutrients.;Long Term Goal: Adherence to prescribed nutrition plan.     Nutrition Discharge: Nutrition Assessments - 05/07/23 1035  Rate Your Plate Scores Post Score 83     Education Questionnaire Score: Knowledge Questionnaire Score - 05/07/23 1647  Knowledge Questionnaire Score Pre Score 21/24  Post Score  23/24     Goals reviewed with patient; copy given to patient.Pt graduates from  Intensive/Traditional cardiac rehab program on 07/24 /24 with completion of  30 exercise and  29 education sessions. Pt maintained good attendance and progressed nicely during their participation in rehab as evidenced by increased MET level.   Medication list reconciled. Repeat  PHQ score- 0 .  Pt has made significant lifestyle changes and should be commended for their success. Bill achieved their goals during cardiac rehab. Bill lost 8.8 kg while in the program. We are proud of Bill's progress and weight loss!  Pt plans to continue exercise at 3 days a week at the gym on the treadmill and doing weights. Bill plans to exercise at home on his recumbent bike 2 days a week. Thayer Headings RN BSN

## 2023-05-16 ENCOUNTER — Encounter (HOSPITAL_COMMUNITY): Payer: Medicare HMO

## 2023-05-16 ENCOUNTER — Telehealth (HOSPITAL_COMMUNITY): Payer: Self-pay

## 2023-05-16 NOTE — Telephone Encounter (Signed)
Called pt to inform of no CRP2 due to telemetry downtime. Pt voiced understanding.   Jonna Coup, MS, ACSM-CEP 05/16/2023 9:17 AM

## 2023-05-19 ENCOUNTER — Encounter: Payer: Self-pay | Admitting: *Deleted

## 2023-05-19 DIAGNOSIS — Z006 Encounter for examination for normal comparison and control in clinical research program: Secondary | ICD-10-CM

## 2023-05-19 NOTE — Research (Signed)
12 Week Follow-Up Visit Completed*-Control patient    []  Not Necessary, No Potential Adverse Events Or Medication Issues Reported On Completed Subject Questionnaire   [x]  Yes, Contact With Subject/Alternate Contact Completed   []  Yes, No Contact With Subject/Alternate Contact Completed, But Electronic Health Record Was Reviewed   []  No, Unable To Contact Subject/Alternate Contact   Have you reviewed Ongoing medications on the Targeted Concomitant Medication form and updated the form as needed?   [x]  Yes   []  No   Subject Status*   [x]  Continuing In Follow-up   []  At Risk For Lost To Follow-up   []  Withdrawal From All Future Study Activities Including Passive Follow-up By Electronic Health Record Review Or Contact With Healthcare Provider Or Family Member/Friend   []  Death   Vital Status*   [x]  Alive   []  Deceased   []  Unknown   Last Known To Be Alive Source*   [x]  Subject Completed Follow-up Questionnaire/Seen In Person/Via Telephone Contact   []  Family Member or Caretaker   []  Primary Physician Or Medical Records   []  Publicly Available Source   []  Other  Date of last dose taken   DAY/MONTH/YEAR n/a  Over the last 12 weeks did the subject miss any doses?n/a  Over the last 12 weeks did the subject restart Evolocumab after an interruption?n/a

## 2023-05-21 ENCOUNTER — Telehealth: Payer: Medicare HMO

## 2023-05-21 ENCOUNTER — Encounter (HOSPITAL_COMMUNITY)
Admission: RE | Admit: 2023-05-21 | Payer: Medicare HMO | Source: Ambulatory Visit | Attending: Plastic Surgery | Admitting: Plastic Surgery

## 2023-05-21 VITALS — BP 108/72 | HR 66 | Ht 71.25 in | Wt 254.2 lb

## 2023-05-21 DIAGNOSIS — I213 ST elevation (STEMI) myocardial infarction of unspecified site: Secondary | ICD-10-CM

## 2023-05-23 ENCOUNTER — Ambulatory Visit (HOSPITAL_COMMUNITY): Payer: Medicare HMO

## 2023-05-29 ENCOUNTER — Ambulatory Visit (HOSPITAL_BASED_OUTPATIENT_CLINIC_OR_DEPARTMENT_OTHER): Payer: Medicare HMO | Admitting: Pulmonary Disease

## 2023-06-06 ENCOUNTER — Other Ambulatory Visit: Payer: Self-pay | Admitting: Internal Medicine

## 2023-06-09 ENCOUNTER — Ambulatory Visit (INDEPENDENT_AMBULATORY_CARE_PROVIDER_SITE_OTHER): Payer: Medicare HMO | Admitting: Pharmacist

## 2023-06-09 DIAGNOSIS — E785 Hyperlipidemia, unspecified: Secondary | ICD-10-CM

## 2023-06-09 DIAGNOSIS — I10 Essential (primary) hypertension: Secondary | ICD-10-CM

## 2023-06-09 DIAGNOSIS — I499 Cardiac arrhythmia, unspecified: Secondary | ICD-10-CM

## 2023-06-09 DIAGNOSIS — I25810 Atherosclerosis of coronary artery bypass graft(s) without angina pectoris: Secondary | ICD-10-CM

## 2023-06-09 NOTE — Patient Instructions (Addendum)
"  GO" Foods on the Pritikin Diet include: Fruits Vegetables Whole Grains like whole-wheat bread, brown rice, whole-wheat pasta, and oatmeal Starchy Vegetables like potatoes, corn, and yams Legumes such as beans (like black beans, pinto beans, and garbanzo beans); peas; and lentils Lean Calcium-Rich Foods such as nonfat dairy milk, nonfat yogurt, and fortified soymilk Fish (a rich source of omega-3-fatty acids) Lean Sources of Protein (very low in saturated fat) such as skinless white poultry; lean red meat like bison and venison; and plant sources of protein, such as legumes and soy-based foods like tofu and edamame (soybeans)  "CAUTION" Foods on the Pritikin Diet include: Saturated-Fat-Rich Foods such as butter; tropical oils like coconut oil; fatty meats; and dairy foods like cheese, cream, and whole/low-fat milk Organ Meats Processed Meats such as hot dogs, bacon, and bologna Partially Hydrogenated Vegetable Oils Cholesterol-Rich Foods like egg yolks  "STOP" Foods on the Pritikin Diet include: Oils Refined Sweeteners such as sugar, corn syrup, and honey Salt Refined Grains such as white bread, white pasta, and white rice

## 2023-06-09 NOTE — Progress Notes (Signed)
06/09/2023 Name: Steven Munoz MRN: [id number] DOB: 1941-10-13  Chief Complaint  Patient presents with   Medication Management    Follow up    Steven Munoz is a 82 y.o. year old male who presented for a telephone visit.   They were referred to the pharmacist by their PCP for assistance in managing complex medication management.    Subjective:  Care Team: Primary Care Provider: Wanda Plump, MD ; Next Scheduled Visit: 07/2023 Cardiologist: Dr Eden Emms; Next Scheduled Visit: December 2024  Medication Access/Adherence  Current Pharmacy:  [facility] [address], [place] - [address] [address] Phone: [phone] Fax: [phone]  [facility] [address] [place] [address] Phone: [phone] Fax: [phone]   Patient reports affordability concerns with their medications:  has reached coverage gap but does not qualify for medication assistance program  Patient reports access/transportation concerns to their pharmacy: No  Patient reports adherence concerns with their medications:  No      Hypertension / CHF:  Current medications:  ACEi/ARB/ARNI: Entresto 49/51mg  SGLT2i: Farxiga 10mg  daily  Beta blocker: metoprolol succinate XL 25mg  daily  Mineralocorticoid Receptor Antagonist: spironolactone Diuretic regimen: furosemide if needed  He also has hydralazine to take if needed when SBP > 150  At previous visit screened for medication assistance program but patient did not qualify. Also screened for [facility] but also did not qualify.  Verified transitions of care pharmacy used 30 day free coupon for Farxiga when dispensed 01/30/2023. Patient cost should be $47/30 day for each Sherryll Burger and Marcelline Deist until he reaches coverage gap and then cost would be around $150 /30 days for each.   Patient has a validated, automated, upper arm home BP cuff Current blood pressure readings  readings: 120/66, 128/65, 109/66, 111/56, 117/63, 122/74, 111/64, 121/74, 115/72 HR: 55, 47, 39, 38 (take from watch) 55, 40, 57, 67, 71, 49, 55, 50, 52 (taken from blood pressure cuff) Denies dizziness or lightheadedness.  Patient has a history of PVCs which might affect home blood pressure cuff's and watch's accuracy to provide blood pressure and HR readings.   Patient has sleep apnea - he is wearing CPAP every night.   Patient denies hypertensive symptoms including no headache, chest pain, shortness of breath.   BP Readings from Last 3 Encounters:  05/21/23 108/72  03/18/23 114/60  03/04/23 (!) 102/52    Checking weight daily. Only varies 1 or 2 lbs per day. H has not needed to take furosemide for fluid retention this year.    Current meal patterns: He is eating a lot of vegetables. Following Pridikin diet. Has increased beans and limiting serving sizes.  Limiting meats.   Has lost about 38lbs since STEMI 01/25/2023  Current physical activity: just completed cardiac rehab.  He also has going to gym 3 days per week. 30 minutes on treadmill and 1 hour with weights.  2 to 3 days per week he does recumbent bike.  Denies shortness of breath with exercise.   Patient denies volume overload signs or symptoms including no shortness of breath, lower extremity edema, increased use of pillows at night   Hyperlipidemia/ASCVD Risk Reduction  Current lipid lowering medications: rosuvastatin 40mg  daily (also taking colestipol which can lower lipids but he is taking for GI issues / diarrhea)  Antiplatelet regimen: clopidogrel 75mg  daily and aspirin 81mg  daily    Objective:  Lab Results  Component Value Date   HGBA1C 5.7 05/09/2022  Lab Results  Component Value Date   CREATININE 0.90 02/13/2023   BUN 17 02/13/2023   NA 139 02/13/2023   K 4.0 02/13/2023   CL 105 02/13/2023   CO2 25 02/13/2023    Lab Results  Component Value Date   CHOL 86 01/28/2023   HDL 37 (L)  01/28/2023   LDLCALC 35 01/28/2023   TRIG 72 01/28/2023   CHOLHDL 2.3 01/28/2023    Medications Reviewed Today     Reviewed by Henrene Pastor, RPH-CPP (Pharmacist) on 06/09/23 at 1719  Med List Status: <None>   Medication Order Taking? Sig Documenting Provider Last Dose Status Informant  allopurinol (ZYLOPRIM) 100 MG tablet [id number] Yes TAKE 1 TABLET BY MOUTH EVERY DAY Steven Munoz [place], MD Taking Active   aspirin EC 81 MG tablet [id number] Yes Take 81 mg by mouth daily. [provider] Taking Active Self  clopidogrel (PLAVIX) 75 MG tablet [id number] Yes Take 1 tablet (75 mg total) by mouth daily with breakfast. Wendall Stade, MD Taking Active   Coenzyme Q10 (COQ10 PO) [id number] Yes Take 1 capsule by mouth daily. [provider] Taking Active Self  colestipol (COLESTID) 1 g tablet [id number] Yes Take 1 g by mouth daily. [provider] Taking Active Self, Pharmacy Records           Med Note [facility], KAREN A   Mon Feb 24, 2023  9:41 AM)    Cyanocobalamin (VITAMIN B-12 PO) [id number] Yes Take 1 tablet by mouth daily. [provider] Taking Active Self  dapagliflozin propanediol (FARXIGA) 10 MG TABS tablet [id number] Yes Take 1 tablet (10 mg total) by mouth daily. Wendall Stade, MD Taking Active   famotidine (PEPCID) 40 MG tablet [id number] Yes Take 40 mg by mouth at bedtime. [provider] Taking Active Self, Pharmacy Records  furosemide (LASIX) 20 MG tablet [id number] Yes Take 1 tablet (20 mg total) by mouth daily as needed. Arty Baumgartner, NP Taking Active   hydrALAZINE (APRESOLINE) 10 MG tablet [id number] Yes Take 1 tablet (10 mg total) by mouth 2 (two) times daily as needed. Take one tablet as needed for systolic BP >150 Laverda Page B, NP Taking Active   levothyroxine (SYNTHROID) 125 MCG tablet [id number] Yes TAKE 1 TABLET BY MOUTH DAILY BEFORE BREAKFAST. Wanda Plump, MD Taking Active   magnesium oxide (MAG-OX) 400 (240 Mg) MG tablet  [id number] Yes Take 400 mg by mouth at bedtime. [provider] Taking Active Self  metoprolol succinate (TOPROL-XL) 25 MG 24 hr tablet [id number] Yes Take 1 tablet (25 mg total) by mouth daily. Wendall Stade, MD Taking Active   Multiple Vitamins-Minerals (PRESERVISION AREDS 2+MULTI VIT) CAPS [id number] Yes Take 1 capsule by mouth 2 (two) times daily.  [provider] Taking Active Self, Pharmacy Records  nitroGLYCERIN (NITROSTAT) 0.4 MG SL tablet [id number] Yes Place 1 tablet (0.4 mg total) under the tongue every 5 (five) minutes as needed. Wendall Stade, MD Taking Active   Omega-3 Fatty Acids (FISH OIL PO) [id number] Yes Take 1 capsule by mouth daily. [provider] Taking Active Self  rosuvastatin (CRESTOR) 40 MG tablet [id number] Yes Take 1 tablet (40 mg total) by mouth daily. Wendall Stade, MD Taking Active   sacubitril-valsartan [facility]) 49-51 MG [id number] Yes Take 1 tablet by mouth 2 (two) times daily. Wendall Stade, MD Taking Active   spironolactone (ALDACTONE) 25 MG tablet [id number] Yes Take 0.5 tablets (12.5 mg total) by mouth daily. Andrey Farmer, PA-C Taking  Active               Assessment/Plan:   CHF / Hypertension: patient is taking GDM for CHF. Blood pressure has been good. HR has been on low side but no symptoms of low HR from patient. Great job with weight loss and maintaining exercise.  - Continue to check blood pressure and HR daily and record. Will let PCP know about low HR.  - Reviewed to weigh daily and when to contact cardiology with weight gain - Continue current dietary modifications - increase vegetable, limit sodium intake, limit intake of fat. - Recommend to continue current therapy    Hyperlipidemia/ASCVD Risk Reduction: - Reviewed long term complications of uncontrolled cholesterol - Recommend to rosuvastatin 40mg  daily    Follow Up Plan: 3 to 4 months  Henrene Pastor, PharmD Clinical Pharmacist  Primary  Care [facility]

## 2023-06-12 ENCOUNTER — Telehealth: Payer: Self-pay | Admitting: Pharmacist

## 2023-06-12 NOTE — Telephone Encounter (Signed)
-----   Message from [facility] sent at 06/10/2023  3:55 PM EDT ----- The best way to figure this out would be a EKG. Because it is asymptomatic he can do it in October (next visit) otherwise we could bring him for a office visit with me or a nurse visit. ----- Message ----- From: Henrene Pastor, RPH-CPP Sent: 06/09/2023   5:26 PM EDT To: Wanda Plump, MD  Patient has noted some low HRs 38, 39. Mostly in 40's and 50's. Denies symptoms of dizziness or lightheadedness. Patient has a history of PVCs and not sure that automatic blood pressure / watch is accurately measuring HR. Any recommendations?

## 2023-06-12 NOTE — Telephone Encounter (Signed)
Patient notified. He feels comfortable waiting until he sees cardiologist unless he has any symptoms - lightheadedness, dizziness, fatigue.   He had a question about his colestipol. Reports that some of his tablets of colestipol have "exploded" in his medication container. He was wondering if this is normal. I checked and there was no reports of this happening but it is possible that moisture in the air or other medication is causing this since the tablets in his bottle has not have this issue.  Colestipol is a bile acid sequestrant (binds with bile acid).   Recommended he try not placing colestipol in medication container. If continues to have problems with "exploding" tablets call back. We could change to colestipol powder.

## 2023-07-07 ENCOUNTER — Other Ambulatory Visit: Payer: Self-pay | Admitting: Cardiovascular Disease

## 2023-07-14 DIAGNOSIS — H00015 Hordeolum externum left lower eyelid: Secondary | ICD-10-CM | POA: Diagnosis not present

## 2023-07-28 ENCOUNTER — Other Ambulatory Visit: Payer: Self-pay | Admitting: *Deleted

## 2023-08-11 ENCOUNTER — Encounter: Payer: Self-pay | Admitting: *Deleted

## 2023-08-11 DIAGNOSIS — Z006 Encounter for examination for normal comparison and control in clinical research program: Secondary | ICD-10-CM

## 2023-08-11 DIAGNOSIS — R197 Diarrhea, unspecified: Secondary | ICD-10-CM | POA: Diagnosis not present

## 2023-08-11 DIAGNOSIS — K8681 Exocrine pancreatic insufficiency: Secondary | ICD-10-CM | POA: Diagnosis not present

## 2023-08-11 NOTE — Research (Signed)
24 Week Follow-Up Visit Completed*   []  Not Necessary, No Potential Adverse Events Or Medication Issues Reported On Completed Subject Questionnaire   [x]  Yes, Contact With Subject/Alternate Contact Completed   []  Yes, No Contact With Subject/Alternate Contact Completed, But Electronic Health Record Was Reviewed   []  No, Unable To Contact Subject/Alternate Contact   Have you reviewed Ongoing medications on the Targeted Concomitant Medication form and updated the form as needed?   [x]  Yes   []  No   Subject Status*   [x]  Continuing In Follow-up   []  At Risk For Lost To Follow-up   []  Withdrawal From All Future Study Activities Including Passive Follow-up By Electronic Health Record Review Or Contact With Healthcare Provider Or Family Member/Friend   []  Death   Vital Status*   [x]  Alive   []  Deceased   []  Unknown   Last Known To Be Alive Source*   [x]  Subject Completed Follow-up Questionnaire/Seen In Person/Via Telephone Contact   []  Family Member or Caretaker   []  Primary Physician Or Medical Records   []  Publicly Available Source   []  Other

## 2023-08-18 ENCOUNTER — Encounter: Payer: Self-pay | Admitting: Internal Medicine

## 2023-08-18 ENCOUNTER — Ambulatory Visit: Payer: Medicare HMO | Admitting: Internal Medicine

## 2023-08-18 VITALS — BP 136/74 | HR 48 | Temp 98.1°F | Resp 18 | Ht 71.0 in | Wt 256.0 lb

## 2023-08-18 DIAGNOSIS — E039 Hypothyroidism, unspecified: Secondary | ICD-10-CM

## 2023-08-18 DIAGNOSIS — E785 Hyperlipidemia, unspecified: Secondary | ICD-10-CM | POA: Diagnosis not present

## 2023-08-18 DIAGNOSIS — Z0001 Encounter for general adult medical examination with abnormal findings: Secondary | ICD-10-CM

## 2023-08-18 DIAGNOSIS — I5020 Unspecified systolic (congestive) heart failure: Secondary | ICD-10-CM

## 2023-08-18 DIAGNOSIS — R739 Hyperglycemia, unspecified: Secondary | ICD-10-CM | POA: Diagnosis not present

## 2023-08-18 DIAGNOSIS — Z0000 Encounter for general adult medical examination without abnormal findings: Secondary | ICD-10-CM | POA: Diagnosis not present

## 2023-08-18 LAB — COMPREHENSIVE METABOLIC PANEL
ALT: 24 U/L (ref 0–53)
AST: 24 U/L (ref 0–37)
Albumin: 4.4 g/dL (ref 3.5–5.2)
Alkaline Phosphatase: 60 U/L (ref 39–117)
BUN: 19 mg/dL (ref 6–23)
CO2: 30 meq/L (ref 19–32)
Calcium: 9.7 mg/dL (ref 8.4–10.5)
Chloride: 103 meq/L (ref 96–112)
Creatinine, Ser: 0.79 mg/dL (ref 0.40–1.50)
GFR: 82.61 mL/min (ref >60.00)
Glucose, Bld: 99 mg/dL (ref 70–99)
Potassium: 4.8 meq/L (ref 3.5–5.1)
Sodium: 139 meq/L (ref 135–145)
Total Bilirubin: 1.3 mg/dL — ABNORMAL HIGH (ref 0.2–1.2)
Total Protein: 6.8 g/dL (ref 6.0–8.3)

## 2023-08-18 LAB — LIPID PANEL
Cholesterol: 118 mg/dL (ref 0–200)
HDL: 44.8 mg/dL (ref >39.00)
LDL Cholesterol: 52 mg/dL (ref 0–99)
NonHDL: 73.08
Total CHOL/HDL Ratio: 3
Triglycerides: 107 mg/dL (ref 0.0–149.0)
VLDL: 21.4 mg/dL (ref 0.0–40.0)

## 2023-08-18 LAB — CBC WITH DIFFERENTIAL/PLATELET
Basophils Absolute: 0 10*3/uL (ref 0.0–0.1)
Basophils Relative: 0.8 % (ref 0.0–3.0)
Eosinophils Absolute: 0.2 10*3/uL (ref 0.0–0.7)
Eosinophils Relative: 3.2 % (ref 0.0–5.0)
HCT: 48.7 % (ref 39.0–52.0)
Hemoglobin: 15.9 g/dL (ref 13.0–17.0)
Lymphocytes Relative: 25.9 % (ref 12.0–46.0)
Lymphs Abs: 1.4 10*3/uL (ref 0.7–4.0)
MCHC: 32.6 g/dL (ref 30.0–36.0)
MCV: 98 fL (ref 78.0–100.0)
Monocytes Absolute: 0.4 10*3/uL (ref 0.1–1.0)
Monocytes Relative: 8 % (ref 3.0–12.0)
Neutro Abs: 3.2 10*3/uL (ref 1.4–7.7)
Neutrophils Relative %: 62.1 % (ref 43.0–77.0)
Platelets: 155 10*3/uL (ref 150.0–400.0)
RBC: 4.97 Mil/uL (ref 4.22–5.81)
RDW: 13.7 % (ref 11.5–15.5)
WBC: 5.2 10*3/uL (ref 4.0–10.5)

## 2023-08-18 LAB — TSH: TSH: 0.43 u[IU]/mL (ref 0.35–5.50)

## 2023-08-18 LAB — MAGNESIUM: Magnesium: 2.2 mg/dL (ref 1.5–2.5)

## 2023-08-18 LAB — HEMOGLOBIN A1C: Hgb A1c MFr Bld: 5.6 % (ref 4.6–6.5)

## 2023-08-18 NOTE — Assessment & Plan Note (Signed)
Here for CPX - Td 2021 -pnm 23- 2012;  prevnar: 2016; PNM 20: 2023 -  zostavax 05-2016 , s/p shingrix   -RSV discussed with patient - s/p recent Covid shot  and flu shot   - rec RSV -[facility]: multiple cscopes,  10-2012 Dr Candelaria Stagers; cscope again 08/2019, + polyps, Cscope 01/22/2023   - prostate ca screening: aged out  -Labs reviewed: CMP FLP CBC A1c TSH magnesium - diet/exercise: Doing great, exercises 4 times a week, watches  his diet carefully -POA: Info provided

## 2023-08-18 NOTE — Patient Instructions (Signed)
  Check the  blood pressure regularly Blood pressure goal:  between 110/65 and  135/85. If it is consistently higher or lower, let me know     GO TO THE LAB : Get the blood work     Next visit with me in 6 months Please schedule it at the front desk        "Health Care Power of attorney" ,  "Living will" (Advance care planning documents)  If you already have a living will or healthcare power of attorney, is recommended you bring the copy to be scanned in your chart.   The document will be available to all the doctors you see in the system.  Advance care planning is a process that supports adults in  understanding and sharing their preferences regarding future medical care.  The patient's preferences are recorded in documents called Advance Directives and the can be modified at any time while the patient is in full mental capacity.   If you don't have one, please consider create one.      More information at: [email or web address]

## 2023-08-18 NOTE — Assessment & Plan Note (Signed)
Here for CPX  CAD, 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 Finished cardiac rehab, now exercising on his own, 4 times a week for multiple hours. Remains asymptomatic, on multiple medicines, good compliance.   Checking general labs. HTN: BP is very good here, ambulatory BPs in the 120s/70s.  Heart rate in the 50s, continue present care, on hydralazine as needed which he hardly ever takes. Hyperglycemia, check A1c Hyperlipidemia: On Crestor, checking FLP.  Further advised for results. Hypothyroidism: On Synthroid, check a TSH.  Further advised for results. OSA: Saw pulmonary 02/2023 good CPAP compliance Gout: No recent symptoms, sees rheumatology.  On allopurinol. RTC 6 months.

## 2023-08-18 NOTE — Progress Notes (Signed)
Subjective:    Patient ID: Steven Munoz, male    DOB: 1941-02-26, 82 y.o.   MRN: [id number]  DOS:  08/18/2023 Type of visit - description: CPX  Here for CPX Had a non-STEMI, chart reviewed. Doing well. Goes to the gym regularly, denies chest pain, palpitation or lower extremity edema.   Review of Systems  Other than above, a 14 point review of systems is negative     Past Medical History:  Diagnosis Date   CAD (coronary artery disease) CABG  05/29/09    OPERATIVE PROCEDURES:  Median sternotomy, extracorporeal circulation,    Colonic polyp 2002, 2005, 2009   Dr Kinnie Scales   Dermatophytosis of nail    Diverticulosis    Dysmetabolic syndrome    GERD (gastroesophageal reflux disease)    S/P dilation  2005   Gout    Hyperlipidemia    Hypertension    Hypothyroidism    Murmur    Nephrolithiasis    X 1   Peripheral neuropathy 03/20/2021   Pneumonia age 88   treated as inpatient   Sleep apnea    on CPAP; Dr Vassie Loll    Past Surgical History:  Procedure Laterality Date   athroscopy     right knee X2, left knee X1   CARDIAC CATHETERIZATION     COLONOSCOPY  2002 & 2009 &2014   polypectomy; Dr Kinnie Scales   CORONARY ARTERY BYPASS GRAFT  2010   Median sternotomy, extracorporeal circulation, coronary bypass graft surgery x3 using a left internal mammary artery graft to left anterior descending coronary artery, with a sequential  saphenous vein graft to posterior descending and posterolateral branches  of the right coronary artery.  Endoscopic vein harvesting from the right   EYE SURGERY Right    tear duct   LEFT HEART CATH AND CORONARY ANGIOGRAPHY N/A 01/26/2023   Procedure: Coronary/Graft Acute MI Revascularization;  Surgeon: Marykay Lex, MD;  Location: [facility] INVASIVE CV LAB;  Service: Cardiovascular;  Laterality: N/A;   Nail Alvusion Bilateral    Hallux   NOSE SURGERY  11/20/2012   L max antrectomy;septoplasty;turbinate reduction. Dr Osvaldo Angst   REPLACEMENT TOTAL KNEE      right 2003   REPLACEMENT TOTAL KNEE     partial Left March 2009   TENDON RELEASE Right 03/2016   from R hip. @ [name]   TOTAL HIP ARTHROPLASTY  02/22/2014   right hip; Dr [facility]   Social History   Socioeconomic History   Marital status: Married    Spouse name: Orlie Pollen   Number of children: 2   Years of education: Not on file   Highest education level: Some college, no degree  Occupational History   Occupation: retired- Theatre stage manager: [employer]  Tobacco Use   Smoking status: Former    Current packs/day: 0.00    Average packs/day: 1 pack/day for 6.0 years (6.0 ttl pk-yrs)    Types: Cigarettes    Start date: 10/28/1960    Quit date: 10/28/1966    Years since quitting: 56.8   Smokeless tobacco: Never   Tobacco comments:    smoked 1962- 1968, up to < 1 ppd  Vaping Use   Vaping status: Never Used  Substance and Sexual Activity   Alcohol use: Yes    Alcohol/week: 3.0 standard drinks of alcohol    Types: 3 Glasses of wine per week    Comment: socially   Drug use: No   Sexual activity: Not  on file  Other Topics Concern   Not on file  Social History Narrative   Socially; lives with spouse   Right Handed   Drinks no caffeine   Social Determinants of Health   Financial Resource Strain: Low Risk  (02/03/2023)   Overall Financial Resource Strain (CARDIA)    Difficulty of Paying Living Expenses: Not hard at all  Food Insecurity: Low Risk  (08/11/2023)   Received from [facility]   Hunger Vital Sign    Worried About Running Out of Food in the Last Year: Never true    Ran Out of Food in the Last Year: Never true  Transportation Needs: No Transportation Needs (08/11/2023)   Received from [facility]    In the past 12 months, has lack of reliable transportation kept you from medical appointments, meetings, work or from getting things needed for daily living? : No  Physical Activity: Sufficiently Active (02/03/2023)   Exercise  Vital Sign    Days of Exercise per Week: 4 days    Minutes of Exercise per Session: 130 min  Stress: No Stress Concern Present (02/03/2023)   [employer] of Occupational Health - Occupational Stress Questionnaire    Feeling of Stress : Only a little  Social Connections: Socially Integrated (02/03/2023)   Social Connection and Isolation Panel [NHANES]    Frequency of Communication with Friends and Family: More than three times a week    Frequency of Social Gatherings with Friends and Family: Once a week    Attends Religious Services: More than 4 times per year    Active Member of [employer] or Organizations: Yes    Attends Engineer, structural: More than 4 times per year    Marital Status: Married  Catering manager Violence: Not At Risk (01/26/2023)   Humiliation, Afraid, Rape, and Kick questionnaire    Fear of Current or Ex-Partner: No    Emotionally Abused: No    Physically Abused: No    Sexually Abused: No     Current Outpatient Medications  Medication Instructions   allopurinol (ZYLOPRIM) 100 mg, Oral, Daily   aspirin EC 81 mg, Oral, Daily   clopidogrel (PLAVIX) 75 mg, Oral, Daily with breakfast   Coenzyme Q10 (COQ10 PO) 1 capsule, Oral, Daily   colestipol (COLESTID) 1 g, Oral, Daily   Cyanocobalamin (VITAMIN B-12 PO) 1 tablet, Oral, Daily   dapagliflozin propanediol (FARXIGA) 10 mg, Oral, Daily   famotidine (PEPCID) 40 mg, Oral, Daily at bedtime   furosemide (LASIX) 20 mg, Oral, Daily PRN   hydrALAZINE (APRESOLINE) 10 mg, Oral, 2 times daily PRN, Take one tablet as needed for systolic BP >150   levothyroxine (SYNTHROID) 125 mcg, Oral, Daily before breakfast   magnesium oxide (MAG-OX) 400 mg, Oral, Daily at bedtime   metoprolol succinate (TOPROL-XL) 25 mg, Oral, Daily   Multiple Vitamins-Minerals (PRESERVISION AREDS 2+MULTI VIT) CAPS 1 capsule, Oral, 2 times daily   nitroGLYCERIN (NITROSTAT) 0.4 mg, Sublingual, Every 5 min PRN   Omega-3 Fatty Acids (FISH OIL PO) 1  capsule, Oral, Daily   rosuvastatin (CRESTOR) 40 mg, Oral, Daily   sacubitril-valsartan (ENTRESTO) 49-51 MG 1 tablet, Oral, 2 times daily   spironolactone (ALDACTONE) 12.5 mg, Oral, Daily       Objective:   Physical Exam BP 136/74   Pulse (!) 48   Temp 98.1 F (36.7 C)   Resp 18   Ht 5\' 11"  (1.803 m)   Wt 256 lb (116.1 kg)   SpO2  98%   BMI 35.70 kg/m  General: Well developed, NAD, BMI noted Neck: No  thyromegaly  HEENT:  Normocephalic . Face symmetric, atraumatic Lungs:  CTA B Normal respiratory effort, no intercostal retractions, no accessory muscle use. Heart: RRR, trace systolic murmur?     Abdomen:  Not distended, soft, non-tender. No rebound or rigidity.   Lower extremities: no pretibial edema bilaterally  Skin: Exposed areas without rash. Not pale. Not jaundice Neurologic:  alert & oriented X3.  Speech normal, gait appropriate for age and unassisted Strength symmetric and appropriate for age.  Psych: Cognition and judgment appear intact.  Cooperative with normal attention span and concentration.  Behavior appropriate. No anxious or depressed appearing.     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  No clear evidence of radiculopathy. 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 Finished cardiac rehab, now exercising on his own, 4 times a week for multiple hours. Remains asymptomatic, on multiple medicines, good compliance.   Checking general labs. HTN: BP is very good here, ambulatory BPs in the 120s/70s.  Heart rate in the 50s, continue present care, on hydralazine as needed which he hardly ever takes. Hyperglycemia, check A1c Hyperlipidemia: On Crestor, checking FLP.  Further advised for results. Hypothyroidism: On Synthroid, check a TSH.  Further advised for results. OSA: Saw pulmonary 02/2023 good CPAP compliance Gout: No recent symptoms, sees rheumatology.  On allopurinol. RTC 6 months.

## 2023-08-22 DIAGNOSIS — L821 Other seborrheic keratosis: Secondary | ICD-10-CM | POA: Diagnosis not present

## 2023-08-22 DIAGNOSIS — D1801 Hemangioma of skin and subcutaneous tissue: Secondary | ICD-10-CM | POA: Diagnosis not present

## 2023-08-22 DIAGNOSIS — D2372 Other benign neoplasm of skin of left lower limb, including hip: Secondary | ICD-10-CM | POA: Diagnosis not present

## 2023-08-22 DIAGNOSIS — D225 Melanocytic nevi of trunk: Secondary | ICD-10-CM | POA: Diagnosis not present

## 2023-08-22 DIAGNOSIS — L814 Other melanin hyperpigmentation: Secondary | ICD-10-CM | POA: Diagnosis not present

## 2023-08-22 DIAGNOSIS — L570 Actinic keratosis: Secondary | ICD-10-CM | POA: Diagnosis not present

## 2023-08-22 DIAGNOSIS — R040 Epistaxis: Secondary | ICD-10-CM | POA: Diagnosis not present

## 2023-08-22 DIAGNOSIS — C439 Malignant melanoma of skin, unspecified: Secondary | ICD-10-CM

## 2023-08-22 DIAGNOSIS — D0339 Melanoma in situ of other parts of face: Secondary | ICD-10-CM | POA: Diagnosis not present

## 2023-08-22 DIAGNOSIS — D2272 Melanocytic nevi of left lower limb, including hip: Secondary | ICD-10-CM | POA: Diagnosis not present

## 2023-08-22 DIAGNOSIS — D485 Neoplasm of uncertain behavior of skin: Secondary | ICD-10-CM | POA: Diagnosis not present

## 2023-08-22 DIAGNOSIS — C4359 Malignant melanoma of other part of trunk: Secondary | ICD-10-CM | POA: Diagnosis not present

## 2023-08-22 DIAGNOSIS — D692 Other nonthrombocytopenic purpura: Secondary | ICD-10-CM | POA: Diagnosis not present

## 2023-08-22 HISTORY — DX: Malignant melanoma of skin, unspecified: C43.9

## 2023-09-02 DIAGNOSIS — Z85820 Personal history of malignant melanoma of skin: Secondary | ICD-10-CM | POA: Insufficient documentation

## 2023-09-02 DIAGNOSIS — C439 Malignant melanoma of skin, unspecified: Secondary | ICD-10-CM | POA: Insufficient documentation

## 2023-09-04 DIAGNOSIS — C4359 Malignant melanoma of other part of trunk: Secondary | ICD-10-CM | POA: Diagnosis not present

## 2023-09-04 DIAGNOSIS — D0339 Melanoma in situ of other parts of face: Secondary | ICD-10-CM | POA: Diagnosis not present

## 2023-09-04 DIAGNOSIS — L988 Other specified disorders of the skin and subcutaneous tissue: Secondary | ICD-10-CM | POA: Diagnosis not present

## 2023-09-06 ENCOUNTER — Other Ambulatory Visit: Payer: Self-pay | Admitting: Cardiovascular Disease

## 2023-09-09 ENCOUNTER — Telehealth: Payer: Self-pay | Admitting: Pharmacist

## 2023-09-09 NOTE — Telephone Encounter (Signed)
Clinical Pharmacist Practitioner, Henrene Pastor will be out of office tomorrow afternoon for a meeting. Called patient to reschedule appointment 09/10/2023 at 3:30 to another time next week.  Unable to reach patient. LM on VM with my contact number [phone] or main office number.

## 2023-09-10 ENCOUNTER — Telehealth: Payer: Self-pay | Admitting: Internal Medicine

## 2023-09-10 ENCOUNTER — Telehealth: Payer: Medicare HMO

## 2023-09-10 NOTE — Telephone Encounter (Signed)
Pt said he tried to call the number left by pharmacist but it was an extra digit so he could not return her call. He said that he is okay to cancel appt for today (appt cancelled) and reschedule for next week. Please call to reschedule appt.

## 2023-09-10 NOTE — Progress Notes (Signed)
Please see phone note from 09/10/2023 - patient called back

## 2023-09-11 ENCOUNTER — Ambulatory Visit: Payer: Medicare HMO | Admitting: *Deleted

## 2023-09-11 DIAGNOSIS — Z0000 Encounter for general adult medical examination without abnormal findings: Secondary | ICD-10-CM | POA: Diagnosis not present

## 2023-09-11 NOTE — Patient Instructions (Signed)
Steven Munoz , Thank you for taking time to come for your Medicare Wellness Visit. I appreciate your ongoing commitment to your health goals. Please review the following plan we discussed and let me know if I can assist you in the future.      This is a list of the screening recommended for you and due dates:  Health Maintenance  Topic Date Due   COVID-19 Vaccine (8 - 2023-24 season) 10/03/2023   Medicare Annual Wellness Visit  09/10/2024   Colon Cancer Screening  01/22/2028   DTaP/Tdap/Td vaccine (3 - Td or Tdap) 07/04/2030   Pneumonia Vaccine  Completed   Flu Shot  Completed   HPV Vaccine  Aged Out   Hepatitis C Screening  Discontinued   Zoster (Shingles) Vaccine  Discontinued    Next appointment: Follow up in one year for your annual wellness visit.   Preventive Care 72 Years and Older, Male Preventive care refers to lifestyle choices and visits with your health care provider that can promote health and wellness. What does preventive care include? A yearly physical exam. This is also called an annual well check. Dental exams once or twice a year. Routine eye exams. Ask your health care provider how often you should have your eyes checked. Personal lifestyle choices, including: Daily care of your teeth and gums. Regular physical activity. Eating a healthy diet. Avoiding tobacco and drug use. Limiting alcohol use. Practicing safe sex. Taking low doses of aspirin every day. Taking vitamin and mineral supplements as recommended by your health care provider. What happens during an annual well check? The services and screenings done by your health care provider during your annual well check will depend on your age, overall health, lifestyle risk factors, and family history of disease. Counseling  Your health care provider may ask you questions about your: Alcohol use. Tobacco use. Drug use. Emotional well-being. Home and relationship well-being. Sexual activity. Eating  habits. History of falls. Memory and ability to understand (cognition). Work and work Astronomer. Screening  You may have the following tests or measurements: Height, weight, and BMI. Blood pressure. Lipid and cholesterol levels. These may be checked every 5 years, or more frequently if you are over 24 years old. Skin check. Lung cancer screening. You may have this screening every year starting at age 67 if you have a 30-pack-year history of smoking and currently smoke or have quit within the past 15 years. Fecal occult blood test (FOBT) of the stool. You may have this test every year starting at age 37. Flexible sigmoidoscopy or colonoscopy. You may have a sigmoidoscopy every 5 years or a colonoscopy every 10 years starting at age 76. Prostate cancer screening. Recommendations will vary depending on your family history and other risks. Hepatitis C blood test. Hepatitis B blood test. Sexually transmitted disease (STD) testing. Diabetes screening. This is done by checking your blood sugar (glucose) after you have not eaten for a while (fasting). You may have this done every 1-3 years. Abdominal aortic aneurysm (AAA) screening. You may need this if you are a current or former smoker. Osteoporosis. You may be screened starting at age 49 if you are at high risk. Talk with your health care provider about your test results, treatment options, and if necessary, the need for more tests. Vaccines  Your health care provider may recommend certain vaccines, such as: Influenza vaccine. This is recommended every year. Tetanus, diphtheria, and acellular pertussis (Tdap, Td) vaccine. You may need a Td booster every 10  years. Zoster vaccine. You may need this after age 38. Pneumococcal 13-valent conjugate (PCV13) vaccine. One dose is recommended after age 47. Pneumococcal polysaccharide (PPSV23) vaccine. One dose is recommended after age 61. Talk to your health care provider about which screenings and  vaccines you need and how often you need them. This information is not intended to replace advice given to you by your health care provider. Make sure you discuss any questions you have with your health care provider. Document Released: 11/10/2015 Document Revised: 07/03/2016 Document Reviewed: 08/15/2015 Elsevier Interactive Patient Education  2017 [employer].  Fall Prevention in the Home Falls can cause injuries. They can happen to people of all ages. There are many things you can do to make your home safe and to help prevent falls. What can I do on the outside of my home? Regularly fix the edges of walkways and driveways and fix any cracks. Remove anything that might make you trip as you walk through a door, such as a raised step or threshold. Trim any bushes or trees on the path to your home. Use bright outdoor lighting. Clear any walking paths of anything that might make someone trip, such as rocks or tools. Regularly check to see if handrails are loose or broken. Make sure that both sides of any steps have handrails. Any raised decks and porches should have guardrails on the edges. Have any leaves, snow, or ice cleared regularly. Use sand or salt on walking paths during winter. Clean up any spills in your garage right away. This includes oil or grease spills. What can I do in the bathroom? Use night lights. Install grab bars by the toilet and in the tub and shower. Do not use towel bars as grab bars. Use non-skid mats or decals in the tub or shower. If you need to sit down in the shower, use a plastic, non-slip stool. Keep the floor dry. Clean up any water that spills on the floor as soon as it happens. Remove soap buildup in the tub or shower regularly. Attach bath mats securely with double-sided non-slip rug tape. Do not have throw rugs and other things on the floor that can make you trip. What can I do in the bedroom? Use night lights. Make sure that you have a light by your  bed that is easy to reach. Do not use any sheets or blankets that are too big for your bed. They should not hang down onto the floor. Have a firm chair that has side arms. You can use this for support while you get dressed. Do not have throw rugs and other things on the floor that can make you trip. What can I do in the kitchen? Clean up any spills right away. Avoid walking on wet floors. Keep items that you use a lot in easy-to-reach places. If you need to reach something above you, use a strong step stool that has a grab bar. Keep electrical cords out of the way. Do not use floor polish or wax that makes floors slippery. If you must use wax, use non-skid floor wax. Do not have throw rugs and other things on the floor that can make you trip. What can I do with my stairs? Do not leave any items on the stairs. Make sure that there are handrails on both sides of the stairs and use them. Fix handrails that are broken or loose. Make sure that handrails are as long as the stairways. Check any carpeting to make sure  that it is firmly attached to the stairs. Fix any carpet that is loose or worn. Avoid having throw rugs at the top or bottom of the stairs. If you do have throw rugs, attach them to the floor with carpet tape. Make sure that you have a light switch at the top of the stairs and the bottom of the stairs. If you do not have them, ask someone to add them for you. What else can I do to help prevent falls? Wear shoes that: Do not have high heels. Have rubber bottoms. Are comfortable and fit you well. Are closed at the toe. Do not wear sandals. If you use a stepladder: Make sure that it is fully opened. Do not climb a closed stepladder. Make sure that both sides of the stepladder are locked into place. Ask someone to hold it for you, if possible. Clearly mark and make sure that you can see: Any grab bars or handrails. First and last steps. Where the edge of each step is. Use tools that  help you move around (mobility aids) if they are needed. These include: Canes. Walkers. Scooters. Crutches. Turn on the lights when you go into a dark area. Replace any light bulbs as soon as they burn out. Set up your furniture so you have a clear path. Avoid moving your furniture around. If any of your floors are uneven, fix them. If there are any pets around you, be aware of where they are. Review your medicines with your doctor. Some medicines can make you feel dizzy. This can increase your chance of falling. Ask your doctor what other things that you can do to help prevent falls. This information is not intended to replace advice given to you by your health care provider. Make sure you discuss any questions you have with your health care provider. Document Released: 08/10/2009 Document Revised: 03/21/2016 Document Reviewed: 11/18/2014 Elsevier Interactive Patient Education  2017 [employer].

## 2023-09-11 NOTE — Progress Notes (Signed)
Subjective:   Steven Munoz is a 82 y.o. male who presents for Medicare Annual/Subsequent preventive examination.  Visit Complete: Virtual I connected with  Steven Munoz on 09/11/23 by a audio enabled telemedicine application and verified that I am speaking with the correct person using two identifiers.  Patient Location: Home  Provider Location: Office/Clinic  I discussed the limitations of evaluation and management by telemedicine. The patient expressed understanding and agreed to proceed.  Vital Signs: Because this visit was a virtual/telehealth visit, some criteria may be missing or patient reported. Any vitals not documented were not able to be obtained and vitals that have been documented are patient reported.  Patient Medicare AWV questionnaire was completed by the patient on 11/03/22; I have confirmed that all information answered by patient is correct and no changes since this date.  Cardiac Risk Factors include: advanced age (>27men, >75 women);male gender;dyslipidemia;hypertension     Objective:    There were no vitals filed for this visit. There is no height or weight on file to calculate BMI.     09/11/2023   11:03 AM 02/06/2023   11:39 AM 01/27/2023    9:40 PM 01/25/2023   11:31 PM 08/13/2022    1:08 PM 08/07/2021   10:28 AM 09/20/2016    4:20 PM  Advanced Directives  Does Patient Have a Medical Advance Directive? Yes No  No Yes Yes Yes  Type of Estate agent of [place];Living will    Healthcare Power of [place];Living will Healthcare Power of [place];Living will Living will;Healthcare Power of Attorney  Does patient want to make changes to medical advance directive? No - Patient declined        Copy of Healthcare Power of Attorney in Chart? No - copy requested    No - copy requested No - copy requested No - copy requested  Would patient like information on creating a medical advance directive?   No - Patient declined        Current  Medications (verified) Outpatient Encounter Medications as of 09/11/2023  Medication Sig   allopurinol (ZYLOPRIM) 100 MG tablet TAKE 1 TABLET BY MOUTH EVERY DAY   aspirin EC 81 MG tablet Take 81 mg by mouth daily.   clopidogrel (PLAVIX) 75 MG tablet Take 1 tablet (75 mg total) by mouth daily with breakfast.   Coenzyme Q10 (COQ10 PO) Take 1 capsule by mouth daily.   colestipol (COLESTID) 1 g tablet Take 1 g by mouth daily.   Cyanocobalamin (VITAMIN B-12 PO) Take 1 tablet by mouth daily.   dapagliflozin propanediol (FARXIGA) 10 MG TABS tablet Take 1 tablet (10 mg total) by mouth daily.   famotidine (PEPCID) 40 MG tablet Take 40 mg by mouth at bedtime.   furosemide (LASIX) 20 MG tablet Take 1 tablet (20 mg total) by mouth daily as needed. (Patient not taking: Reported on 08/18/2023)   hydrALAZINE (APRESOLINE) 10 MG tablet Take 1 tablet (10 mg total) by mouth 2 (two) times daily as needed. Take one tablet as needed for systolic BP >150 (Patient not taking: Reported on 08/18/2023)   levothyroxine (SYNTHROID) 125 MCG tablet TAKE 1 TABLET BY MOUTH DAILY BEFORE BREAKFAST.   magnesium oxide (MAG-OX) 400 (240 Mg) MG tablet Take 400 mg by mouth at bedtime.   metoprolol succinate (TOPROL-XL) 25 MG 24 hr tablet Take 1 tablet (25 mg total) by mouth daily.   Multiple Vitamins-Minerals (PRESERVISION AREDS 2+MULTI VIT) CAPS Take 1 capsule by mouth 2 (two) times daily.  nitroGLYCERIN (NITROSTAT) 0.4 MG SL tablet Place 1 tablet (0.4 mg total) under the tongue every 5 (five) minutes as needed. (Patient not taking: Reported on 08/18/2023)   Omega-3 Fatty Acids (FISH OIL PO) Take 1 capsule by mouth daily.   rosuvastatin (CRESTOR) 40 MG tablet TAKE 1 TABLET BY MOUTH EVERY DAY   sacubitril-valsartan (ENTRESTO) 49-51 MG TAKE 1 TABLET BY MOUTH TWICE A DAY   spironolactone (ALDACTONE) 25 MG tablet Take 0.5 tablets (12.5 mg total) by mouth daily.   No facility-administered encounter medications on file as of  09/11/2023.    Allergies (verified) Povidone-iodine, Ofloxacin, and Tape   History: Past Medical History:  Diagnosis Date   Allergy 2004   Tape,   Arthritis    CAD (coronary artery disease) CABG  05/29/09    OPERATIVE PROCEDURES:  Median sternotomy, extracorporeal circulation,    Cataract    Colonic polyp 2002, 2005, 2009   Dr Kinnie Scales   Dermatophytosis of nail    Diverticulosis    Dysmetabolic syndrome    GERD (gastroesophageal reflux disease)    S/P dilation  2005   Gout    Hyperlipidemia    Hypertension    Hypothyroidism    Melanoma (HCC) 08/22/2023   Murmur    Nephrolithiasis    X 1   Peripheral neuropathy 03/20/2021   Pneumonia age 76   treated as inpatient   Sleep apnea    on CPAP; Dr Vassie Loll   Past Surgical History:  Procedure Laterality Date   athroscopy     right knee X2, left knee X1   CARDIAC CATHETERIZATION     COLONOSCOPY  2002 & 2009 &2014   polypectomy; Dr Kinnie Scales   CORONARY ARTERY BYPASS GRAFT  2010   Median sternotomy, extracorporeal circulation, coronary bypass graft surgery x3 using a left internal mammary artery graft to left anterior descending coronary artery, with a sequential  saphenous vein graft to posterior descending and posterolateral branches  of the right coronary artery.  Endoscopic vein harvesting from the right   EYE SURGERY Right    tear duct   JOINT REPLACEMENT  Right knee 2004, right hip 2015   LEFT HEART CATH AND CORONARY ANGIOGRAPHY N/A 01/26/2023   Procedure: Coronary/Graft Acute MI Revascularization;  Surgeon: Marykay Lex, MD;  Location: [facility] INVASIVE CV LAB;  Service: Cardiovascular;  Laterality: N/A;   Nail Alvusion Bilateral    Hallux   NOSE SURGERY  11/20/2012   L max antrectomy;septoplasty;turbinate reduction. Dr Osvaldo Angst   REPLACEMENT TOTAL KNEE     right 2003   REPLACEMENT TOTAL KNEE     partial Left March 2009   TENDON RELEASE Right 03/2016   from R hip. @ [name]   TOTAL HIP ARTHROPLASTY  02/22/2014    right hip; Dr [facility]   Family History  Problem Relation Age of Onset   Hypertension Mother    Transient ischemic attack Mother    Lung cancer Mother        liver cancer, smoker, TIA   Liver cancer Mother    Heart attack Father 73   Heart attack Brother 75       smoker   Diabetes Neg Hx    Colon cancer Neg Hx    Prostate cancer Neg Hx    Social History   Socioeconomic History   Marital status: Married    Spouse name: Orlie Pollen   Number of children: 2   Years of education: Not on file   Highest education level: Some  college, no degree  Occupational History   Occupation: retiredArts development officer: [employer]  Tobacco Use   Smoking status: Former    Current packs/day: 0.00    Average packs/day: 1 pack/day for 6.0 years (6.0 ttl pk-yrs)    Types: Cigarettes    Start date: 10/28/1960    Quit date: 10/28/1966    Years since quitting: 56.9   Smokeless tobacco: Never   Tobacco comments:    smoked 1962- 1968, up to < 1 ppd  Vaping Use   Vaping status: Never Used  Substance and Sexual Activity   Alcohol use: Yes    Alcohol/week: 1.0 standard drink of alcohol    Types: 1 Glasses of wine per week    Comment: socially   Drug use: No   Sexual activity: Yes  Other Topics Concern   Not on file  Social History Narrative   Socially; lives with spouse   Right Handed   Drinks no caffeine   Social Determinants of Health   Financial Resource Strain: Low Risk  (09/04/2023)   Overall Financial Resource Strain (CARDIA)    Difficulty of Paying Living Expenses: Not hard at all  Food Insecurity: No Food Insecurity (09/04/2023)   Hunger Vital Sign    Worried About Running Out of Food in the Last Year: Never true    Ran Out of Food in the Last Year: Never true  Transportation Needs: No Transportation Needs (09/04/2023)   PRAPARE - Administrator, Civil Service (Medical): No    Lack of Transportation (Non-Medical): No  Physical Activity: Sufficiently  Active (09/04/2023)   Exercise Vital Sign    Days of Exercise per Week: 4 days    Minutes of Exercise per Session: 90 min  Stress: No Stress Concern Present (09/04/2023)   [employer] of Occupational Health - Occupational Stress Questionnaire    Feeling of Stress : Not at all  Social Connections: Moderately Integrated (09/04/2023)   Social Connection and Isolation Panel [NHANES]    Frequency of Communication with Friends and Family: Once a week    Frequency of Social Gatherings with Friends and Family: Once a week    Attends Religious Services: More than 4 times per year    Active Member of [employer] or Organizations: Yes    Attends Engineer, structural: More than 4 times per year    Marital Status: Married    Tobacco Counseling Counseling given: Not Answered Tobacco comments: smoked 1962- 1968, up to < 1 ppd   Clinical Intake:  Pre-visit preparation completed: Yes  Pain : No/denies pain  Nutritional Risks: None Diabetes: No  How often do you need to have someone help you when you read instructions, pamphlets, or other written materials from your doctor or pharmacy?: 1 - Never  Interpreter Needed?: No  Information entered by :: Donne Anon, CMA   Activities of Daily Living    09/04/2023    9:43 AM 01/27/2023   10:00 AM  In your present state of health, do you have any difficulty performing the following activities:  Hearing? 1   Comment wears hearing aids   Vision? 0   Difficulty concentrating or making decisions? 0   Walking or climbing stairs? 0   Dressing or bathing? 0   Doing errands, shopping? 0 0  Preparing Food and eating ? N   Using the Toilet? N   In the past six months, have you accidently leaked urine? N  Do you have problems with loss of bowel control? N   Managing your Medications? N   Managing your Finances? N   Housekeeping or managing your Housekeeping? N     Patient Care Team: Wanda Plump, MD as PCP - General (Internal  Medicine) Wendall Stade, MD as PCP - Cardiology (Cardiology) Hallows, Walker Kehr, MD as Consulting Physician (Orthopedic Surgery) Oretha Milch, MD as Consulting Physician (Pulmonary Disease) Sharrell Ku, MD as Consulting Physician (Gastroenterology) Mckinley Jewel, MD as Consulting Physician (Ophthalmology) Ronney Asters, DC as Referring Physician (Chiropractic Medicine) Henrene Pastor, RPH-CPP (Pharmacist) Ranee Gosselin, MD as Consulting Physician (Orthopedic Surgery)  Indicate any recent Medical Services you may have received from other than [facility] providers in the past year (date may be approximate).     Assessment:   This is a routine wellness examination for [place].  Hearing/Vision screen No results found.   Goals Addressed   None    Depression Screen    09/11/2023   11:06 AM 08/18/2023    8:53 AM 05/07/2023    4:46 PM 03/04/2023    1:56 PM 12/16/2022   10:23 AM 11/25/2022    3:09 PM 08/14/2022    9:50 AM  PHQ 2/9 Scores  PHQ - 2 Score 0 0 0 0 0 0 0  PHQ- 9 Score   0 0       Fall Risk    09/04/2023    9:43 AM 08/18/2023    8:53 AM 05/21/2023    9:49 AM 05/14/2023    9:53 AM 05/12/2023   10:35 AM  Fall Risk   Falls in the past year? 0 0 0 0 0  Number falls in past yr: 0 0 0 0 0  Injury with Fall? 0 0 0 0 0  Risk for fall due to : No Fall Risks No Fall Risks Other (Comment) Other (Comment) Other (Comment)  Risk for fall due to: Comment   peripheral neuropathy Peripheral neuropthy   Follow up Falls evaluation completed Falls evaluation completed Falls evaluation completed Falls evaluation completed Falls evaluation completed    MEDICARE RISK AT HOME: Medicare Risk at Home Any stairs in or around the home?: Yes If so, are there any without handrails?: No Home free of loose throw rugs in walkways, pet beds, electrical cords, etc?: No Adequate lighting in your home to reduce risk of falls?: Yes Life alert?: No Use of a cane, walker or w/c?: No Grab bars in  the bathroom?: No Shower chair or bench in shower?: No Elevated toilet seat or a handicapped toilet?: Yes  TIMED UP AND GO:  Was the test performed?  No    Cognitive Function:        09/11/2023   11:13 AM 08/13/2022    1:20 PM  6CIT Screen  What Year? 0 points 0 points  What month? 0 points 0 points  What time? 0 points 0 points  Count back from 20 0 points 0 points  Months in reverse 0 points 0 points  Repeat phrase 0 points 0 points  Total Score 0 points 0 points    Immunizations Immunization History  Administered Date(s) Administered   Fluad Quad(high Dose 65+) 07/06/2019, 07/04/2022   Influenza Split 08/21/2011, 08/26/2012   Influenza Whole 07/29/2008, 08/10/2009, 08/28/2010   Influenza, High Dose Seasonal PF 08/13/2016, 08/12/2017, 08/26/2018, 07/04/2020, 07/24/2021, 08/08/2023   Influenza,inj,Quad PF,6+ Mos 08/31/2013, 08/16/2014, 07/25/2015   PFIZER Comirnaty(Gray Top)Covid-19 Tri-Sucrose Vaccine 03/22/2021   PFIZER(Purple Top)SARS-COV-2 Vaccination 11/10/2019, 11/30/2019,  08/26/2020   PNEUMOCOCCAL CONJUGATE-20 11/05/2021   Pfizer Covid-19 Vaccine Bivalent Booster 67yrs & up 07/20/2021   Pfizer(Comirnaty)Fall Seasonal Vaccine 12 years and older 08/23/2022, 08/08/2023   Pneumococcal Conjugate-13 05/09/2015   Pneumococcal Polysaccharide-23 05/27/2011   Td 05/18/2010   Tdap 07/04/2020   Zoster Recombinant(Shingrix) 07/04/2022, 09/09/2022   Zoster, Live 06/06/2016    TDAP status: Up to date  Flu Vaccine status: Up to date  Pneumococcal vaccine status: Up to date  Covid-19 vaccine status: Completed vaccines  Qualifies for Shingles Vaccine? Yes   Zostavax completed Yes   Shingrix Completed?: Yes  Screening Tests Health Maintenance  Topic Date Due   Medicare Annual Wellness (AWV)  08/14/2023   COVID-19 Vaccine (8 - 2023-24 season) 10/03/2023   Colonoscopy  01/22/2028   DTaP/Tdap/Td (3 - Td or Tdap) 07/04/2030   Pneumonia Vaccine 78+ Years old   Completed   INFLUENZA VACCINE  Completed   HPV VACCINES  Aged Out   Hepatitis C Screening  Discontinued   Zoster Vaccines- Shingrix  Discontinued    Health Maintenance  Health Maintenance Due  Topic Date Due   Medicare Annual Wellness (AWV)  08/14/2023    Colorectal cancer screening: Type of screening: Colonoscopy. Completed 01/22/23. Repeat every 5 years  Lung Cancer Screening: (Low Dose CT Chest recommended if Age 55-80 years, 20 pack-year currently smoking OR have quit w/in 15years.) does not qualify.   Additional Screening:  Hepatitis C Screening: does qualify; Completed 03/20/21  Vision Screening: Recommended annual ophthalmology exams for early detection of glaucoma and other disorders of the eye. Is the patient up to date with their annual eye exam?  Yes  Who is the provider or what is the name of the office in which the patient attends annual eye exams? Dr. Cathey Endow If pt is not established with a provider, would they like to be referred to a provider to establish care? No .   Dental Screening: Recommended annual dental exams for proper oral hygiene  Diabetic Foot Exam: N/a  Community Resource Referral / Chronic Care Management: CRR required this visit?  No   CCM required this visit?  No     Plan:     I have personally reviewed and noted the following in the patient's chart:   Medical and social history Use of alcohol, tobacco or illicit drugs  Current medications and supplements including opioid prescriptions. Patient is not currently taking opioid prescriptions. Functional ability and status Nutritional status Physical activity Advanced directives List of other physicians Hospitalizations, surgeries, and ER visits in previous 12 months Vitals Screenings to include cognitive, depression, and falls Referrals and appointments  In addition, I have reviewed and discussed with patient certain preventive protocols, quality metrics, and best practice  recommendations. A written personalized care plan for preventive services as well as general preventive health recommendations were provided to patient.     Donne Anon, CMA   09/11/2023   After Visit Summary: (MyChart) Due to this being a telephonic visit, the after visit summary with patients personalized plan was offered to patient via MyChart   Nurse Notes: None

## 2023-09-24 NOTE — Progress Notes (Signed)
Patient ID: Steven Munoz, male   DOB: Aug 02, 1941, 82 y.o.   MRN: [id number]     82 y.o. CABG August 2010 He had ostial LAD, distal RCA/PDA disease and had LIMA to LAD SVG Sequential to PDA and PLB EF normal circumflex not grafted. Dr Vassie Loll follow him for OSA and CPAP Activity limited by right hip pain post surgery at [facility] and f/u tendon release. Dr Iona Hansen.  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    Seen in ED 02/06/23 for elevated BP and atypical chest pain. 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 Has had vertigo and peripheral neuropathy R xwith neurontin   Has gotten to VIR to drive his Alicia Amel 3 days this year Given neuropathy will Likely have to stop driving   He has a son Olegario Shearer who is dentist in [place] with two grand sons   Had some dyspnea when I saw him in April and BNP mildly elevated improved with addition of aldactone  Weight down 40 lbs had some [place] surgery on right side of face recently Compliant with meds     ROS: Denies fever, malais, weight loss, blurry vision, decreased visual acuity, cough, sputum, SOB, hemoptysis, pleuritic pain, palpitaitons, heartburn, abdominal pain, melena, lower extremity edema, claudication, or rash.  All other systems reviewed and negative  General: BP (!) 142/60   Pulse (!) 55   Ht 5\' 11"  (1.803 m)   Wt 253 lb (114.8 kg)   SpO2 97%   BMI 35.29 kg/m  Affect appropriate Overweight white male  RLE varicosities HEENT: Tinnitus   Neck supple with no adenopathy JVP normal no bruits no thyromegaly Lungs clear with no wheezing and good diaphragmatic motion Heart:  S1/S2 SEM  murmur, no rub, gallop or click PMI normal Abdomen: benighn, BS positve, no tenderness, no AAA no bruit.  No HSM or HJR Distal pulses intact with no bruits Peripheral neuropathy in feet  Skin warm and dry Right TKR with RLE varicosities and mild edema    Current Outpatient Medications  Medication Sig Dispense Refill   allopurinol (ZYLOPRIM) 100 MG tablet TAKE 1 TABLET BY MOUTH EVERY DAY 90 tablet 3   aspirin EC 81 MG tablet Take 81 mg by mouth daily.     clopidogrel (PLAVIX) 75 MG tablet Take 1 tablet (75 mg total) by mouth daily with breakfast. 90 tablet 2   Coenzyme Q10 (COQ10 PO) Take 1 capsule by mouth daily.     colestipol (COLESTID) 1 g tablet Take 1 g by mouth daily.     Cyanocobalamin (VITAMIN B-12 PO) Take 1 tablet by mouth daily.     dapagliflozin propanediol (FARXIGA) 10 MG TABS tablet Take 1 tablet (10 mg total) by mouth daily. 90 tablet 3   famotidine (PEPCID) 40 MG tablet Take 40 mg by mouth at bedtime.     levothyroxine (SYNTHROID) 125 MCG tablet TAKE 1 TABLET BY MOUTH DAILY BEFORE BREAKFAST. 90 tablet 1  magnesium oxide (MAG-OX) 400 (240 Mg) MG tablet Take 400 mg by mouth at bedtime.     metoprolol succinate (TOPROL-XL) 25 MG 24 hr tablet TAKE 1 TABLET (25 MG TOTAL) BY MOUTH DAILY. 90 tablet 1   Multiple Vitamins-Minerals (PRESERVISION AREDS 2+MULTI VIT) CAPS Take 1 capsule by mouth 2 (two) times daily.      Omega-3 Fatty Acids (FISH OIL PO) Take 1 capsule by mouth daily.     rosuvastatin (CRESTOR) 40 MG tablet TAKE 1 TABLET BY MOUTH EVERY DAY 90 tablet 2   sacubitril-valsartan (ENTRESTO) 49-51 MG TAKE 1 TABLET BY MOUTH TWICE A DAY 60 tablet 0   spironolactone (ALDACTONE) 25 MG tablet Take 0.5 tablets (12.5 mg total) by mouth daily. 45 tablet 3   furosemide (LASIX) 20 MG tablet Take 1 tablet (20 mg total) by mouth daily as  needed. (Patient not taking: Reported on 08/18/2023) 30 tablet 1   hydrALAZINE (APRESOLINE) 10 MG tablet Take 1 tablet (10 mg total) by mouth 2 (two) times daily as needed. Take one tablet as needed for systolic BP >150 (Patient not taking: Reported on 08/18/2023) 60 tablet 0   nitroGLYCERIN (NITROSTAT) 0.4 MG SL tablet Place 1 tablet (0.4 mg total) under the tongue every 5 (five) minutes as needed. (Patient not taking: Reported on 08/18/2023) 25 tablet 1   No current facility-administered medications for this visit.    Allergies  Povidone-iodine, Ofloxacin, and Tape  Electrocardiogram:  08/14/20 SR rate 53 normal 10/08/2023 NSR rate 62 PR 222 msec stable 10/08/2023 SR rate 52 PR 216 msec   Assessment and Plan  Murmur: Mild AS by TTE 06/18/22 consider f/u in 2 years   CAD/CABG: 2010 [place] to LAD and SVG to PDA/PLB  SEMI 01/2023 with thrombotic lesion to distal anastomosis of PDA graft Rx medically Complicated by large retroperitoneal bleed No vessel extravasation on CT 01/29/23  Hct stable 02/06/23 35.4 Continue ASA/Plavix given thrombotic lesion   PAF: post op no recurrence ASA and beta blocker LA severely dilated on 58 mm on TTE  Puts him at increased risk of recurrence   HTN: ARB changed to [facility] 02/06/23 with PRN hydralazine  improved .    Thyroid:  Lab Results  Component Value Date   TSH 0.43 08/18/2023    Cholesterol  Lab Results  Component Value Date   LDLCALC 52 08/18/2023    Ortho: post right hip surgery [name]  Post tendon release in June 2018  with relief of pain  First Degree: no change on ECG  no high grade AV block PR 216 msec  ENT:  F/u [name] bells palsy resolved still with tinnitus wearing hearing aids post tear duct surgery   Dyspnea:  BNP 281 02/13/23 CXR NAD aldactone added 02/13/23 with improvement    F/U in 6 months     Charlton Haws

## 2023-10-01 ENCOUNTER — Other Ambulatory Visit: Payer: Self-pay | Admitting: Cardiovascular Disease

## 2023-10-07 ENCOUNTER — Ambulatory Visit: Payer: Medicare HMO | Admitting: Cardiovascular Disease

## 2023-10-08 ENCOUNTER — Ambulatory Visit: Payer: Medicare HMO | Attending: Cardiovascular Disease | Admitting: Cardiovascular Disease

## 2023-10-08 ENCOUNTER — Encounter: Payer: Self-pay | Admitting: Cardiovascular Disease

## 2023-10-08 VITALS — BP 142/60 | HR 55 | Ht 71.0 in | Wt 253.0 lb

## 2023-10-08 DIAGNOSIS — I10 Essential (primary) hypertension: Secondary | ICD-10-CM

## 2023-10-08 DIAGNOSIS — R011 Cardiac murmur, unspecified: Secondary | ICD-10-CM | POA: Diagnosis not present

## 2023-10-08 DIAGNOSIS — I5022 Chronic systolic (congestive) heart failure: Secondary | ICD-10-CM | POA: Diagnosis not present

## 2023-10-08 DIAGNOSIS — I25810 Atherosclerosis of coronary artery bypass graft(s) without angina pectoris: Secondary | ICD-10-CM | POA: Diagnosis not present

## 2023-10-08 NOTE — Patient Instructions (Addendum)
Medication Instructions:  Your physician recommends that you continue on your current medications as directed. Please refer to the Current Medication list given to you today.  *If you need a refill on your cardiac medications before your next appointment, please call your pharmacy*  Lab Work: If you have labs (blood work) drawn today and your tests are completely normal, you will receive your results only by: [place] (if you have MyChart) OR A paper copy in the mail If you have any lab test that is abnormal or we need to change your treatment, we will call you to review the results.  Testing/Procedures: None ordered today.  Follow-Up: At [facility], you and your health needs are our priority.  As part of our continuing mission to provide you with exceptional heart care, we have created designated Provider Care Teams.  These Care Teams include your primary Cardiologist (physician) and Advanced Practice Providers (APPs -  Physician Assistants and Nurse Practitioners) who all work together to provide you with the care you need, when you need it.  We recommend signing up for the patient portal called "MyChart".  Sign up information is provided on this After Visit Summary.  MyChart is used to connect with patients for Virtual Visits (Telemedicine).  Patients are able to view lab/test results, encounter notes, upcoming appointments, etc.  Non-urgent messages can be sent to your provider as well.   To learn more about what you can do with MyChart, go to [email or web address].    Your next appointment:   12 month(s)  Provider:   Jenkins Rouge, MD

## 2023-10-13 ENCOUNTER — Encounter: Payer: Self-pay | Admitting: *Deleted

## 2023-10-13 ENCOUNTER — Other Ambulatory Visit: Payer: Self-pay

## 2023-10-13 ENCOUNTER — Telehealth: Payer: Self-pay | Admitting: Cardiovascular Disease

## 2023-10-13 DIAGNOSIS — Z006 Encounter for examination for normal comparison and control in clinical research program: Secondary | ICD-10-CM

## 2023-10-13 MED ORDER — ENTRESTO 49-51 MG PO TABS: 1.0000 | ORAL_TABLET | Freq: Two times a day (BID) | ORAL | 3 refills | Status: DC

## 2023-10-13 NOTE — Telephone Encounter (Signed)
*  STAT* If patient is at the pharmacy, call can be transferred to refill team.   1. Which medications need to be refilled? (please list name of each medication and dose if known)   sacubitril-valsartan (ENTRESTO) 49-51 MG   2. Would you like to learn more about the convenience, safety, & potential cost savings by using the [facility]?   3. Are you open to using the [facility] (Type [facility]. ).  4. Which pharmacy/location (including street and city if local pharmacy) is medication to be sent to?  [id number] - [place], [place] - [address]   5. Do they need a 30 day or 90 day supply?  30 day  Patient stated he some medication left but will be going out of town.

## 2023-10-14 DIAGNOSIS — H353131 Nonexudative age-related macular degeneration, bilateral, early dry stage: Secondary | ICD-10-CM | POA: Diagnosis not present

## 2023-10-14 DIAGNOSIS — H524 Presbyopia: Secondary | ICD-10-CM | POA: Diagnosis not present

## 2023-11-20 ENCOUNTER — Other Ambulatory Visit: Payer: Self-pay | Admitting: Cardiovascular Disease

## 2023-11-26 ENCOUNTER — Ambulatory Visit: Payer: Medicare HMO | Admitting: Internal Medicine

## 2023-12-01 ENCOUNTER — Other Ambulatory Visit (HOSPITAL_COMMUNITY): Payer: Self-pay | Admitting: Cardiology

## 2023-12-01 MED ORDER — SPIRONOLACTONE 25 MG PO TABS: 12.5000 mg | ORAL_TABLET | Freq: Every day | ORAL | 3 refills | Status: AC

## 2023-12-07 ENCOUNTER — Other Ambulatory Visit: Payer: Self-pay | Admitting: Internal Medicine

## 2023-12-25 NOTE — Progress Notes (Signed)
 Office Visit Note  Patient: Steven Munoz             Date of Birth: 05-28-1941           MRN: [id number]             PCP: Wanda Plump, MD Referring: Wanda Plump, MD Visit Date: 01/07/2024   Subjective:  Follow-up (Patient states sometimes his right thumb will bother him randomly while he is doing nothing. Patient states he has some bumps acting up on his right index finger. )   Discussed the use of AI scribe software for clinical note transcription with the patient, who gave verbal consent to proceed.  History of Present Illness   Steven Munoz is a 83 y.o. male here for follow up for gout on allopurinol 100 mg daily.    He has experienced Heberden nodes on his fingers for several years, which are generally painless unless bumped. Occasionally, he experiences sharp, ice-pick-like pain in his thumb without movement. These symptoms have been attributed to osteoarthritis, with bone spurring on the joint borders.  He describes a long-standing issue with his finger getting stuck, which he attributes to a problem at the A1 pulley. Approximately 15 years ago, he received a steroid injection that provided relief. The finger gets stuck during activities but not at night.  He has a history of gout but reports no flare-ups in the past year while on allopurinol. He confirms no swelling and continues to take allopurinol regularly.  He reports neuropathy symptoms in his feet, with numbness affecting nearly the entire foot. 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 He has tried red light therapy and stimulants recently but is uncertain of their effectiveness. He was previously prescribed gabapentin but discontinued it due to lack of pain.  He mentions a past cardiac event about a year ago, which led to heart rehabilitation. He has a history of three bypass surgeries but no heart attacks. He exercises regularly, attending the gym for  25-30 years, which he believes has contributed to his heart health.    Previous HPI 11/25/2022 Steven Munoz is a 83 y.o. male here for follow up for gout on allopurinol 100 mg daily.  He has not had any significant flares since our last visit.  He has had persistent symptoms affecting his toes with neuropathy but mostly numbness not as much pain.  He stopped taking the gabapentin without an appreciable difference.   Previous HPI 10/31/21 Steven Munoz is a 83 y.o. male here for follow up for gout on allopurinol 100 mg daily and colchicine PRN.  He denies any recurrence of severe joint pain or inflammation in the ankles since our visit last year.  He continues having some neuropathy and decreased sensation most severe in his right great toe.  He has some persistent swelling below the knees. Previously and increase in diuretics with his primary care provider partially improved this.   Previous HPI 05/02/21 Steven Munoz is a 83 y.o. male here for gout currently on allopurinol 100 mg daily and colchicine 0.6 mg PRN.  He takes HCTZ and losartan for hypertension and has history of CABG for CAD but without any chronic loop diuretic use currently.  He states his previous history of gout was at least about 10 years ago he experienced redness swelling and pain around the left great toe that lasted for a few days and  improved with treatment of oral prednisone.  He did not have recurrent or chronic symptoms similar to this inflammation.  He has had some chronic osteoarthritis joint pains with bilateral pain with hand use and bony nodules he had right knee arthroplasty and has some left knee osteoarthritis and crepitus.  Earlier this year he developed pain and swelling with hot to the touch around the right ankle joint.  He took colchicine for this that improved his symptoms but when discontinuing the colchicine after 2 weeks he experienced recurrence of the pain and inflammation.  He went back on the  medicine and after about another 3 weeks of treatment he discontinued this with no return of his symptoms.  He was also started on allopurinol 100 mg daily.  Around the beginning of this episode he experienced increased right-sided sciatica type pain extending from the low back to the right knee.  Due to the sciatica he saw neurology for evaluation work-up demonstrated that he also has peripheral neuropathy probably contributing to pain in his feet as well with recent NCS and serologic testing at [facility] for this.  The symptoms overall are doing well today so he has not started taking the prescribed gabapentin at all for neuropathy.   Labs reviewed 02/2021 Uric acid 6.1   Review of Systems  Constitutional:  Negative for fatigue.  HENT:  Negative for mouth sores and mouth dryness.   Eyes:  Positive for dryness.  Respiratory:  Negative for shortness of breath.   Cardiovascular:  Negative for chest pain and palpitations.  Gastrointestinal:  Negative for blood in stool, constipation and diarrhea.  Endocrine: Negative for increased urination.  Genitourinary:  Negative for involuntary urination.  Musculoskeletal:  Positive for joint pain, gait problem, joint pain, myalgias and myalgias. Negative for joint swelling, muscle weakness, morning stiffness and muscle tenderness.  Skin:  Negative for color change, rash, hair loss and sensitivity to sunlight.  Allergic/Immunologic: Negative for susceptible to infections.  Neurological:  Negative for dizziness and headaches.  Hematological:  Negative for swollen glands.  Psychiatric/Behavioral:  Negative for depressed mood and sleep disturbance. The patient is not nervous/anxious.     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   Idiopathic chronic gout, unspecified site, without tophus (tophi) 10/31/2021   Peripheral neuropathy 03/20/2021   History of otitis externa 12/25/2020   Chronic swimmer's ear of right side 12/11/2020   Bell's palsy 02/09/2020   Gout 10/03/2019   Diarrhea in adult patient 08/04/2019   Diverticulosis 08/04/2019   Trochanteric bursitis of left hip 12/22/2017   Sensorineural hearing loss (SNHL), 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   Annual physical exam 06/06/2016   Psoas tendinitis of right side 04/04/2016   PCP NOTES >>>>>>>>>>>> 02/13/2016   Coronary artery disease involving native coronary artery of native heart with unstable angina pectoris (HCC) 02/13/2016   Pedal edema 01/25/2016   Presence of right artificial hip joint 08/17/2015   Hip flexor tendinitis, right 11/28/2014   Obesity (BMI 30-39.9) 06/02/2014   CTS (carpal tunnel syndrome) 06/01/2014   Varicose veins 04/07/2014   Hip dislocation, right (HCC) 03/11/2014   S/P total hip arthroplasty 03/11/2014   Prolonged P-R interval 03/04/2013  NEPHROLITHIASIS, HX OF 05/18/2010   ATRIAL FIBRILLATION 06/20/2009   CORONARY ARTERY BYPASS GRAFT, HX OF 06/20/2009   PREMATURE VENTRICULAR CONTRACTIONS 05/09/2009   MURMUR 05/08/2009   Essential hypertension 04/10/2009   Diverticulosis of colon 04/10/2009   G E R D 08/05/2008   OSA on CPAP 08/05/2008   DERMATOPHYTOSIS OF NAIL 07/29/2008   Nocturia 05/30/2008   Hypothyroidism 02/15/2008   Hyperlipidemia with target LDL less than 70 02/15/2008   History of gout 02/15/2008   History of colonic polyps 01/14/2008    Past Medical History:  Diagnosis Date   Allergy 2004   Tape,   Arthritis    CAD (coronary artery disease) CABG  05/29/09    OPERATIVE PROCEDURES:  Median sternotomy, extracorporeal circulation,    Cataract    Colonic  polyp 2002, 2005, 2009   Dr Kinnie Scales   Dermatophytosis of nail    Diverticulosis    Dysmetabolic syndrome    GERD (gastroesophageal reflux disease)    S/P dilation  2005   Gout    Hyperlipidemia    Hypertension    Hypothyroidism    Melanoma (HCC) 08/22/2023   Murmur    Nephrolithiasis    X 1   Peripheral neuropathy 03/20/2021   Pneumonia age 55   treated as inpatient   Sleep apnea    on CPAP; Dr Vassie Loll    Family History  Problem Relation Age of Onset   Hypertension Mother    Transient ischemic attack Mother    Lung cancer Mother        liver cancer, smoker, TIA   Liver cancer Mother    Heart attack Father 47   Heart attack Brother 71       smoker   Diabetes Neg Hx    Colon cancer Neg Hx    Prostate cancer Neg Hx    Past Surgical History:  Procedure Laterality Date   athroscopy     right knee X2, left knee X1   CARDIAC CATHETERIZATION     COLONOSCOPY  2002 & 2009 &2014   polypectomy; Dr Kinnie Scales   CORONARY ARTERY BYPASS GRAFT  2010   Median sternotomy, extracorporeal circulation, coronary bypass graft surgery x3 using a left internal mammary artery graft to left anterior descending coronary artery, with a sequential  saphenous vein graft to posterior descending and posterolateral branches  of the right coronary artery.  Endoscopic vein harvesting from the right   EYE SURGERY Right    tear duct   JOINT REPLACEMENT  Right knee 2004, right hip 2015   LEFT HEART CATH AND CORONARY ANGIOGRAPHY N/A 01/26/2023   Procedure: Coronary/Graft Acute MI Revascularization;  Surgeon: Marykay Lex, MD;  Location: [facility] INVASIVE CV LAB;  Service: Cardiovascular;  Laterality: N/A;   Nail Alvusion Bilateral    Hallux   NOSE SURGERY  11/20/2012   L max antrectomy;septoplasty;turbinate reduction. Dr Osvaldo Angst   REPLACEMENT TOTAL KNEE     right 2003   REPLACEMENT TOTAL KNEE     partial Left March 2009   TENDON RELEASE Right 03/2016   from R hip. @ [name]   TOTAL HIP ARTHROPLASTY   02/22/2014   right hip; Dr [facility]   Social History   Social History Narrative   Socially; lives with spouse   Right Handed   Drinks no caffeine   Immunization History  Administered Date(s) Administered   Fluad Quad(high Dose 65+) 07/06/2019, 07/04/2022   Influenza Split 08/21/2011, 08/26/2012   Influenza Whole 07/29/2008, 08/10/2009, 08/28/2010  Influenza, High Dose Seasonal PF 08/13/2016, 08/12/2017, 08/26/2018, 07/04/2020, 07/24/2021, 08/08/2023   Influenza,inj,Quad PF,6+ Mos 08/31/2013, 08/16/2014, 07/25/2015   PFIZER Comirnaty(Gray Top)Covid-19 Tri-Sucrose Vaccine 03/22/2021   PFIZER(Purple Top)SARS-COV-2 Vaccination 11/10/2019, 11/30/2019, 08/26/2020   PNEUMOCOCCAL CONJUGATE-20 11/05/2021   Pfizer Covid-19 Vaccine Bivalent Booster 26yrs & up 07/20/2021   Pfizer(Comirnaty)Fall Seasonal Vaccine 12 years and older 08/23/2022   Pneumococcal Conjugate-13 05/09/2015   Pneumococcal Polysaccharide-23 05/27/2011   Td 05/18/2010   Tdap 07/04/2020   Zoster Recombinant(Shingrix) 07/04/2022, 09/09/2022   Zoster, Live 06/06/2016     Objective: Vital Signs: BP 126/71 (BP Location: Left Arm, Patient Position: Sitting, Cuff Size: Large)   Pulse (!) 57   Resp 14   Ht 6' 0.5" (1.842 m)   Wt 262 lb (118.8 kg)   BMI 35.05 kg/m    Physical Exam Cardiovascular:     Rate and Rhythm: Normal rate and regular rhythm.  Pulmonary:     Effort: Pulmonary effort is normal.     Breath sounds: Normal breath sounds.  Musculoskeletal:     Right lower leg: No edema.     Left lower leg: No edema.  Skin:    General: Skin is warm and dry.  Neurological:     Mental Status: He is alert.  Psychiatric:        Mood and Affect: Mood normal.      Musculoskeletal Exam:  Elbows full ROM no tenderness or swelling Wrists full ROM no tenderness or swelling Fingers full ROM, Heberden's nodes of distal interphalangeal joints bilaterally, sharp pains at right thumb on thenar eminence, no swelling,  left 3rd finger triggering A1 pulley Knees full ROM no tenderness or swelling, bilateral crepitus Ankles full ROM no tenderness or swelling MTPs no tenderness or swelling    Investigation: No additional findings.  Imaging: No results found.  Recent Labs: Lab Results  Component Value Date   WBC 5.2 08/18/2023   HGB 15.9 08/18/2023   PLT 155.0 08/18/2023   NA 139 08/18/2023   K 4.8 08/18/2023   CL 103 08/18/2023   CO2 30 08/18/2023   GLUCOSE 99 08/18/2023   BUN 19 08/18/2023   CREATININE 0.79 08/18/2023   BILITOT 1.3 (H) 08/18/2023   ALKPHOS 60 08/18/2023   AST 24 08/18/2023   ALT 24 08/18/2023   PROT 6.8 08/18/2023   ALBUMIN 4.4 08/18/2023   CALCIUM 9.7 08/18/2023   GFRAA  06/01/2009    >60        The eGFR has been calculated using the MDRD equation. This calculation has not been validated in all clinical situations. eGFR's persistently <60 mL/min signify possible Chronic Kidney Disease.    Speciality Comments: No specialty comments available.  Procedures:  No procedures performed Allergies: Povidone-iodine, Ofloxacin, and Tape   Assessment / Plan:     Visit Diagnoses: History of gout - 11/25/2022 Uric Acid 5.1 - Plan: allopurinol (ZYLOPRIM) 100 MG tablet Gout well-managed on allopurinol with no flare-ups in the past year. Not requiring colchicine. Recent UA reviewed from January in goal 5.1. 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 No significant swelling or functional impairment. - Provided information and exercises for hand osteoarthritis.  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 Surgery discussed if symptoms worsen; current management includes stretching and splinting. - Advised on stretching exercises and use of  a  finger splint at night if symptoms worsen.  Idiopathic peripheral neuropathy Chronic peripheral neuropathy with numbness and lack of sensation in the feet. Extensive testing for underlying causes negative. Gabapentin discontinued as it addresses neuropathic pain, which he does not experience. - Encourage continued exercise and circulation improvement strategies.        Orders: No orders of the defined types were placed in this encounter.  Meds ordered this encounter  Medications   allopurinol (ZYLOPRIM) 100 MG tablet    Sig: Take 1 tablet (100 mg total) by mouth daily.    Dispense:  90 tablet    Refill:  3     Follow-Up Instructions: Return in about 1 year (around 01/06/2025) for OA/gout on allopurinol f/u 71yr.   Fuller Plan, MD  Note - This record has been created using [employer].  Chart creation errors have been sought, but may not always  have been located. Such creation errors do not reflect on  the standard of medical care.

## 2024-01-07 ENCOUNTER — Encounter: Payer: Self-pay | Admitting: Internal Medicine

## 2024-01-07 ENCOUNTER — Ambulatory Visit: Payer: Medicare HMO | Attending: Internal Medicine | Admitting: Internal Medicine

## 2024-01-07 VITALS — BP 126/71 | HR 57 | Resp 14 | Ht 72.5 in | Wt 262.0 lb

## 2024-01-07 DIAGNOSIS — Z8739 Personal history of other diseases of the musculoskeletal system and connective tissue: Secondary | ICD-10-CM | POA: Diagnosis not present

## 2024-01-07 DIAGNOSIS — Z5181 Encounter for therapeutic drug level monitoring: Secondary | ICD-10-CM

## 2024-01-07 DIAGNOSIS — M109 Gout, unspecified: Secondary | ICD-10-CM

## 2024-01-07 DIAGNOSIS — M159 Polyosteoarthritis, unspecified: Secondary | ICD-10-CM

## 2024-01-07 DIAGNOSIS — G609 Hereditary and idiopathic neuropathy, unspecified: Secondary | ICD-10-CM | POA: Diagnosis not present

## 2024-01-07 MED ORDER — ALLOPURINOL 100 MG PO TABS: 100.0000 mg | ORAL_TABLET | Freq: Every day | ORAL | 3 refills | Status: AC

## 2024-01-07 NOTE — Patient Instructions (Signed)

## 2024-01-29 ENCOUNTER — Telehealth: Payer: Self-pay | Admitting: Cardiovascular Disease

## 2024-01-29 NOTE — Telephone Encounter (Signed)
 Pt called in stating Dr. Eden Emms wanted him to have a CT done in August but he will be out of town so he would like to scheduled before that. I don't see an order for anything, please advise.

## 2024-01-30 NOTE — Telephone Encounter (Signed)
 Called patient back. Patient stated he is not having any dyspnea or any other symptoms. Patient thought Dr. Eden Emms said something at his last office visit about getting a Calcium score CT in August. Patient thought he was to follow-up in 6 months from December office visit. Informed patient that it was a year, but if he felt like he needed to be seen sooner and discuss "CT" we can get him in before his trip. Patient agreed to plan. Patient scheduled in July.

## 2024-02-05 DIAGNOSIS — M25512 Pain in left shoulder: Secondary | ICD-10-CM | POA: Diagnosis not present

## 2024-02-09 ENCOUNTER — Encounter (HOSPITAL_BASED_OUTPATIENT_CLINIC_OR_DEPARTMENT_OTHER): Payer: Self-pay

## 2024-02-14 ENCOUNTER — Other Ambulatory Visit: Payer: Self-pay | Admitting: Cardiovascular Disease

## 2024-02-15 ENCOUNTER — Other Ambulatory Visit: Payer: Self-pay | Admitting: Internal Medicine

## 2024-02-17 ENCOUNTER — Ambulatory Visit: Payer: Medicare HMO | Admitting: Internal Medicine

## 2024-02-17 ENCOUNTER — Encounter: Payer: Self-pay | Admitting: Internal Medicine

## 2024-02-17 VITALS — BP 128/68 | HR 57 | Temp 98.1°F | Resp 16 | Ht 72.5 in | Wt 252.0 lb

## 2024-02-17 DIAGNOSIS — I25810 Atherosclerosis of coronary artery bypass graft(s) without angina pectoris: Secondary | ICD-10-CM

## 2024-02-17 DIAGNOSIS — C439 Malignant melanoma of skin, unspecified: Secondary | ICD-10-CM

## 2024-02-17 DIAGNOSIS — R399 Unspecified symptoms and signs involving the genitourinary system: Secondary | ICD-10-CM

## 2024-02-17 DIAGNOSIS — E039 Hypothyroidism, unspecified: Secondary | ICD-10-CM

## 2024-02-17 DIAGNOSIS — R634 Abnormal weight loss: Secondary | ICD-10-CM

## 2024-02-17 DIAGNOSIS — I10 Essential (primary) hypertension: Secondary | ICD-10-CM

## 2024-02-17 DIAGNOSIS — E785 Hyperlipidemia, unspecified: Secondary | ICD-10-CM

## 2024-02-17 LAB — CBC WITH DIFFERENTIAL/PLATELET
Basophils Absolute: 0 10*3/uL (ref 0.0–0.1)
Basophils Relative: 0.5 % (ref 0.0–3.0)
Eosinophils Absolute: 0 10*3/uL (ref 0.0–0.7)
Eosinophils Relative: 0.6 % (ref 0.0–5.0)
HCT: 52.1 % — ABNORMAL HIGH (ref 39.0–52.0)
Hemoglobin: 17.4 g/dL — ABNORMAL HIGH (ref 13.0–17.0)
Lymphocytes Relative: 19 % (ref 12.0–46.0)
Lymphs Abs: 1.5 10*3/uL (ref 0.7–4.0)
MCHC: 33.3 g/dL (ref 30.0–36.0)
MCV: 97.4 fl (ref 78.0–100.0)
Monocytes Absolute: 0.7 10*3/uL (ref 0.1–1.0)
Monocytes Relative: 8.4 % (ref 3.0–12.0)
Neutro Abs: 5.7 10*3/uL (ref 1.4–7.7)
Neutrophils Relative %: 71.5 % (ref 43.0–77.0)
Platelets: 159 10*3/uL (ref 150.0–400.0)
RBC: 5.35 Mil/uL (ref 4.22–5.81)
RDW: 14.1 % (ref 11.5–15.5)
WBC: 8 10*3/uL (ref 4.0–10.5)

## 2024-02-17 LAB — URINALYSIS, ROUTINE W REFLEX MICROSCOPIC
Bilirubin Urine: NEGATIVE
Hgb urine dipstick: NEGATIVE
Ketones, ur: NEGATIVE
Leukocytes,Ua: NEGATIVE
Nitrite: NEGATIVE
Specific Gravity, Urine: 1.02 (ref 1.000–1.030)
Total Protein, Urine: NEGATIVE
Urine Glucose: >=1000 — AB
Urobilinogen, UA: 0.2 (ref 0.0–1.0)
pH: 6 (ref 5.0–8.0)

## 2024-02-17 LAB — BASIC METABOLIC PANEL WITH GFR
BUN: 27 mg/dL — ABNORMAL HIGH (ref 6–23)
CO2: 29 meq/L (ref 19–32)
Calcium: 9.8 mg/dL (ref 8.4–10.5)
Chloride: 103 meq/L (ref 96–112)
Creatinine, Ser: 0.9 mg/dL (ref 0.40–1.50)
GFR: 79.14 mL/min (ref >60.00)
Glucose, Bld: 93 mg/dL (ref 70–99)
Potassium: 4.5 meq/L (ref 3.5–5.1)
Sodium: 138 meq/L (ref 135–145)

## 2024-02-17 LAB — TSH: TSH: 1.47 u[IU]/mL (ref 0.35–5.50)

## 2024-02-17 LAB — PSA: PSA: 1.56 ng/mL (ref 0.10–4.00)

## 2024-02-17 NOTE — Progress Notes (Unsigned)
 Subjective:    Patient ID: Steven Munoz, male    DOB: 23-Sep-1941, 83 y.o.   MRN: [id number]  DOS:  02/17/2024 Type of visit - description: Follow-up  Routine checkup, chronic medical problems addressed. He reports weight loss.  His weight was  252 pounds at home a week ago,  today at home weight is 247 pounds. Appetite is normal, denies nausea or vomiting.  No diarrhea or blood in the stools. No cough.  He did report LUTS: Gradually coming up, for the last few months, reports sometimes he leaks urine before he reach the restroom and sometimes he drips after he finishes urinating. No dysuria, no gross hematuria.  No fever or chills.  No abdominal pain.   Wt Readings from Last 3 Encounters:  02/17/24 252 lb (114.3 kg)  01/07/24 262 lb (118.8 kg)  10/08/23 253 lb (114.8 kg)  12/16/2022: Weight 284   Review of Systems See above   Past Medical History:  Diagnosis Date   Allergy 2004   Tape,   Arthritis    CAD (coronary artery disease) CABG  05/29/09    OPERATIVE PROCEDURES:  Median sternotomy, extracorporeal circulation,    Cataract    Colonic polyp 2002, 2005, 2009   Dr Andriette Keeling   Dermatophytosis of nail    Diverticulosis    Dysmetabolic syndrome    GERD (gastroesophageal reflux disease)    S/P dilation  2005   Gout    Hyperlipidemia    Hypertension    Hypothyroidism    Melanoma (HCC) 08/22/2023   Murmur    Nephrolithiasis    X 1   Peripheral neuropathy 03/20/2021   Pneumonia age 61   treated as inpatient   Sleep apnea    on CPAP; Dr Villa Greaser    Past Surgical History:  Procedure Laterality Date   athroscopy     right knee X2, left knee X1   CARDIAC CATHETERIZATION     COLONOSCOPY  2002 & 2009 &2014   polypectomy; Dr Andriette Keeling   CORONARY ARTERY BYPASS GRAFT  2010   Median sternotomy, extracorporeal circulation, coronary bypass graft surgery x3 using a left internal mammary artery graft to left anterior descending coronary artery, with a sequential  saphenous vein  graft to posterior descending and posterolateral branches  of the right coronary artery.  Endoscopic vein harvesting from the right   EYE SURGERY Right    tear duct   JOINT REPLACEMENT  Right knee 2004, right hip 2015   LEFT HEART CATH AND CORONARY ANGIOGRAPHY N/A 01/26/2023   Procedure: Coronary/Graft Acute MI Revascularization;  Surgeon: Arleen Lacer, MD;  Location: [facility] INVASIVE CV LAB;  Service: Cardiovascular;  Laterality: N/A;   Nail Alvusion Bilateral    Hallux   NOSE SURGERY  11/20/2012   L max antrectomy;septoplasty;turbinate reduction. Dr Thompson Flight   REPLACEMENT TOTAL KNEE     right 2003   REPLACEMENT TOTAL KNEE     partial Left March 2009   TENDON RELEASE Right 03/2016   from R hip. @ [name]   TOTAL HIP ARTHROPLASTY  02/22/2014   right hip; Dr [facility]    Current Outpatient Medications  Medication Instructions   allopurinol  (ZYLOPRIM ) 100 mg, Oral, Daily   aspirin  EC 81 mg, Daily   clopidogrel  (PLAVIX ) 75 mg, Oral, Daily with breakfast   Coenzyme Q10 (COQ10 PO) 1 capsule, Daily   colestipol  (COLESTID ) 1 g, Daily   Cyanocobalamin  (VITAMIN B-12 PO) 1 tablet, Daily   dapagliflozin  propanediol (FARXIGA ) 10 mg,  Oral, Daily   famotidine  (PEPCID ) 40 mg, Daily at bedtime   furosemide  (LASIX ) 20 mg, Oral, Daily PRN   hydrALAZINE  (APRESOLINE ) 10 mg, Oral, 2 times daily PRN, Take one tablet as needed for systolic BP >150   levothyroxine  (SYNTHROID ) 125 mcg, Oral, Daily before breakfast   magnesium  oxide (MAG-OX) 400 mg, Daily at bedtime   metoprolol  succinate (TOPROL -XL) 25 mg, Oral, Daily   Multiple Vitamins-Minerals (PRESERVISION AREDS 2+MULTI VIT) CAPS 1 capsule, 2 times daily   nitroGLYCERIN  (NITROSTAT ) 0.4 mg, Sublingual, Every 5 min PRN   Omega-3 Fatty Acids (FISH OIL PO) 1 capsule, Daily   rosuvastatin  (CRESTOR ) 40 mg, Oral, Daily   sacubitril -valsartan  (ENTRESTO ) 49-51 MG 1 tablet, Oral, 2 times daily   spironolactone  (ALDACTONE ) 12.5 mg, Oral, Daily        Objective:   Physical Exam BP 128/68   Pulse (!) 57   Temp 98.1 F (36.7 C) (Oral)   Resp 16   Ht 6' 0.5" (1.842 m)   Wt 252 lb (114.3 kg)   SpO2 96%   BMI 33.71 kg/m  General:   Well developed, NAD, BMI noted.  HEENT:  Normocephalic . Face symmetric, atraumatic Lungs:  CTA B Normal respiratory effort, no intercostal retractions, no accessory muscle use. Heart: RRR,  no murmur.  Abdomen:  Not distended, soft, non-tender. No rebound or rigidity.   Skin: Not pale. Not jaundice DRE: Left side of prostate is slightly larger, not tender, not nodular.  Question of nodule on the R side around 10 mm. Lower extremities: no pretibial edema bilaterally  Neurologic:  alert & oriented X3.  Speech normal, gait appropriate for age and unassisted Psych--  Cognition and judgment appear intact.  Cooperative with normal attention span and concentration.  Behavior appropriate. No anxious or depressed appearing.     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  No clear evidence of radiculopathy. 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  Dr. Theron Flavin H/o Nephrolithiasis H/o L Bell Palsy  08-2016 ; lost > 30% L hearing (had zostavax few weeks earlier)    PLAN: Weight loss: Acute, only in the last week, ROS negative except for LUTS.  Checking a CBC and general labs today. LUTS: Gradually developing LUTS over the last few months as described above.  DRE showed some asymmetry and a question of a  nodule on the right.  Last PSA was 2020:  1.36.  Of note is, he started Farxiga  last year for his heart issues. Plan: PSA, UA, urine culture, further advised for results. Hyperglycemia: Check A1c.  On Farxiga  due to heart issues. HTN: BP looks good, on metoprolol , Entresto , Aldactone .  (Lasix  and hydralazine  only as needed).  Check BMP Hypothyroidism: On Synthroid , check TSH. History of melanoma: Next visit with dermatology May 2025. RTC 3 months

## 2024-02-17 NOTE — Patient Instructions (Addendum)
 INSTRUCTIONS  FOR TODAY   Continue checking your blood pressure regularly Blood pressure goal:  between 110/65 and  135/85. If it is consistently higher or lower, let me know     GO TO THE LAB : Get the blood work and provide a urine sample   Next office visit for a checkup in 3 months.  Sooner if needed Please make an appointment before you leave today

## 2024-02-18 ENCOUNTER — Encounter: Payer: Self-pay | Admitting: *Deleted

## 2024-02-18 DIAGNOSIS — Z006 Encounter for examination for normal comparison and control in clinical research program: Secondary | ICD-10-CM

## 2024-02-18 LAB — URINE CULTURE
MICRO NUMBER:: 16359253
Result:: NO GROWTH
SPECIMEN QUALITY:: ADEQUATE

## 2024-02-18 NOTE — Assessment & Plan Note (Signed)
 Weight loss: Acute, only in the last week, ROS negative except for LUTS.  Checking a CBC and general labs today. LUTS: Gradually developing LUTS over the last few months as described above.  DRE showed some asymmetry and a question of a  nodule on the right.  Last PSA was 2020: 1.36.  Of note is, he started Farxiga  last year for his heart issues. Plan: PSA, UA, urine culture, further advised for results. Hyperglycemia: Check A1c.  On Farxiga  due to heart issues. HTN: BP looks good, on metoprolol , Entresto , Aldactone .  (Lasix  and hydralazine  only as needed).  Check BMP Hypothyroidism: On Synthroid , check TSH. History of melanoma: Next visit with dermatology May 2025. RTC 3 months

## 2024-02-18 NOTE — Research (Signed)
 48 Week-Follow-Up Visit Completed*   []  Not Necessary, No Potential Adverse Events Or Medication Issues Reported On Completed Subject Questionnaire   [x]  Yes, Contact With Subject/Alternate Contact Completed   []  Yes, No Contact With Subject/Alternate Contact Completed, But Electronic Health Record Was Reviewed   []  No, Unable To Contact Subject/Alternate Contact   Have you reviewed Ongoing medications on the Targeted Concomitant Medication form and updated the form as needed?   [x]  Yes   []  No   Subject Status*   [x]  Continuing In Follow-up   []  At Risk For Lost To Follow-up   []  Withdrawal From All Future Study Activities Including Passive Follow-up By Electronic Health Record Review Or Contact With Healthcare Provider Or Family Member/Friend   []  Death   Vital Status*   [x]  Alive   []  Deceased   []  Unknown   Last Known To Be Alive Source*   [x]  Subject Completed Follow-up Questionnaire/Seen In Person/Via Telephone Contact   []  Family Member or Caretaker   []  Primary Physician Or Medical Records   []  Publicly Available Source   []  Other  Current Outpatient Medications on File Prior to Visit  Medication Sig Dispense Refill   allopurinol  (ZYLOPRIM ) 100 MG tablet Take 1 tablet (100 mg total) by mouth daily. 90 tablet 3   aspirin  EC 81 MG tablet Take 81 mg by mouth daily.     clopidogrel  (PLAVIX ) 75 MG tablet TAKE 1 TABLET BY MOUTH DAILY WITH BREAKFAST. 90 tablet 3   Coenzyme Q10 (COQ10 PO) Take 1 capsule by mouth daily.     colestipol  (COLESTID ) 1 g tablet Take 1 g by mouth daily.     Cyanocobalamin  (VITAMIN B-12 PO) Take 1 tablet by mouth daily.     dapagliflozin  propanediol (FARXIGA ) 10 MG TABS tablet Take 1 tablet (10 mg total) by mouth daily. 90 tablet 3   famotidine  (PEPCID ) 40 MG tablet Take 40 mg by mouth at bedtime.     furosemide  (LASIX ) 20 MG tablet Take 1 tablet (20 mg total) by mouth daily as needed. (Patient not taking: Reported on 02/17/2024) 30  tablet 1   hydrALAZINE  (APRESOLINE ) 10 MG tablet Take 1 tablet (10 mg total) by mouth 2 (two) times daily as needed. Take one tablet as needed for systolic BP >150 (Patient not taking: Reported on 02/17/2024) 60 tablet 0   levothyroxine  (SYNTHROID ) 125 MCG tablet Take 1 tablet (125 mcg total) by mouth daily before breakfast. 90 tablet 1   magnesium  oxide (MAG-OX) 400 (240 Mg) MG tablet Take 400 mg by mouth at bedtime.     metoprolol  succinate (TOPROL -XL) 25 MG 24 hr tablet TAKE 1 TABLET (25 MG TOTAL) BY MOUTH DAILY. 90 tablet 1   Multiple Vitamins-Minerals (PRESERVISION AREDS 2+MULTI VIT) CAPS Take 1 capsule by mouth 2 (two) times daily.      nitroGLYCERIN  (NITROSTAT ) 0.4 MG SL tablet Place 1 tablet (0.4 mg total) under the tongue every 5 (five) minutes as needed. (Patient not taking: Reported on 08/18/2023) 25 tablet 1   Omega-3 Fatty Acids (FISH OIL PO) Take 1 capsule by mouth daily.     rosuvastatin  (CRESTOR ) 40 MG tablet TAKE 1 TABLET BY MOUTH EVERY DAY 90 tablet 2   sacubitril -valsartan  (ENTRESTO ) 49-51 MG Take 1 tablet by mouth 2 (two) times daily. 180 tablet 3   spironolactone  (ALDACTONE ) 25 MG tablet Take 0.5 tablets (12.5 mg total) by mouth daily. 45 tablet 3   No current facility-administered medications on file prior to visit.

## 2024-02-20 ENCOUNTER — Ambulatory Visit: Payer: Self-pay | Admitting: Internal Medicine

## 2024-02-20 ENCOUNTER — Other Ambulatory Visit: Payer: Self-pay | Admitting: Cardiovascular Disease

## 2024-02-20 DIAGNOSIS — R319 Hematuria, unspecified: Secondary | ICD-10-CM

## 2024-02-20 NOTE — Telephone Encounter (Signed)
 Chief Complaint: Blood in urine one time this morning  Symptoms: Urinating more frequently Frequency: One time  Pertinent Negatives: Patient denies pain with urination, fever, back/flank pain, abdomen pain, vomiting, nausea  Disposition: [x] Urgent Care (no appt availability in office)   Additional Notes: Patient advised to go to urgent care within 24 hours as he is out of town. This RN is unsure if pt will go.    Copied from CRM [id number]. Topic: Clinical - Red Word Triage >> Feb 20, 2024  3:24 PM [place] J wrote: [facility] that prompted transfer to Nurse Triage: Passed blood through urine today. Reason for Disposition  Blood in urine  (Exception: Could be normal menstrual bleeding.)  Answer Assessment - Initial Assessment Questions COLOR of URINE: "Describe the color of the urine."  (e.g., tea-colored, pink, red, bloody) "Do you have blood clots in your urine?" (e.g., none, pea, grape, small coin)     Bright red, "very little and dissipated quick" ONSET: "When did the bleeding start?"      This morning EPISODES: "How many times has there been blood in the urine?" or "How many times today?"     Once PAIN with URINATION: "Is there any pain with passing your urine?" If Yes, ask: "How bad is the pain?"  (Scale 1-10; or mild, moderate, severe)    - MILD: Complains slightly about urination hurting.    - MODERATE: Interferes with normal activities.      - SEVERE: Excruciating, unwilling or unable to urinate because of the pain.      Denies ASSOCIATED SYMPTOMS: "Are you passing urine more frequently than usual?"     Yes OTHER SYMPTOMS: "Do you have any other symptoms?" (e.g., back/flank pain, abdomen pain, vomiting)     Denies  Protocols used: Urine - Blood In-A-AH

## 2024-02-23 ENCOUNTER — Encounter: Payer: Self-pay | Admitting: Internal Medicine

## 2024-02-23 DIAGNOSIS — M25612 Stiffness of left shoulder, not elsewhere classified: Secondary | ICD-10-CM | POA: Diagnosis not present

## 2024-02-23 DIAGNOSIS — M19012 Primary osteoarthritis, left shoulder: Secondary | ICD-10-CM | POA: Diagnosis not present

## 2024-02-23 DIAGNOSIS — S46012D Strain of muscle(s) and tendon(s) of the rotator cuff of left shoulder, subsequent encounter: Secondary | ICD-10-CM | POA: Diagnosis not present

## 2024-02-23 DIAGNOSIS — M6281 Muscle weakness (generalized): Secondary | ICD-10-CM | POA: Diagnosis not present

## 2024-02-23 MED ORDER — DAPAGLIFLOZIN PROPANEDIOL 10 MG PO TABS: 10.0000 mg | ORAL_TABLET | Freq: Every day | ORAL | 3 refills | Status: AC

## 2024-02-23 NOTE — Telephone Encounter (Signed)
 I see that the patient reports some blood in the urine this morning, recent PSA and UA urine culture negative. Plan:  Please arrange a urology referral Dx hematuria, LUTS.  (He is aware, see results)

## 2024-02-23 NOTE — Telephone Encounter (Signed)
 Referral placed.

## 2024-02-23 NOTE — Addendum Note (Signed)
 Addended by: Pihu Basil on: 02/23/2024 11:48 AM   Modules accepted: Orders

## 2024-03-01 DIAGNOSIS — S46012D Strain of muscle(s) and tendon(s) of the rotator cuff of left shoulder, subsequent encounter: Secondary | ICD-10-CM | POA: Diagnosis not present

## 2024-03-01 DIAGNOSIS — M25612 Stiffness of left shoulder, not elsewhere classified: Secondary | ICD-10-CM | POA: Diagnosis not present

## 2024-03-01 DIAGNOSIS — M19012 Primary osteoarthritis, left shoulder: Secondary | ICD-10-CM | POA: Diagnosis not present

## 2024-03-01 DIAGNOSIS — M6281 Muscle weakness (generalized): Secondary | ICD-10-CM | POA: Diagnosis not present

## 2024-03-02 ENCOUNTER — Ambulatory Visit (HOSPITAL_BASED_OUTPATIENT_CLINIC_OR_DEPARTMENT_OTHER): Payer: Medicare HMO | Admitting: Pulmonary Disease

## 2024-03-02 ENCOUNTER — Telehealth: Payer: Self-pay

## 2024-03-02 DIAGNOSIS — R310 Gross hematuria: Secondary | ICD-10-CM | POA: Diagnosis not present

## 2024-03-02 NOTE — Telephone Encounter (Signed)
 Copied from CRM [id number]. Topic: General - Other >> Mar 02, 2024 10:54 AM Howard Macho wrote: Reason for CRM: sonia from [facility] urology called stating they do not have any notes on the patient for the referral sent for hematuria [phone]

## 2024-03-02 NOTE — Telephone Encounter (Signed)
 Last OV note and labs faxed to [facility] .

## 2024-03-04 ENCOUNTER — Other Ambulatory Visit: Payer: Self-pay | Admitting: Cardiovascular Disease

## 2024-03-08 DIAGNOSIS — N3000 Acute cystitis without hematuria: Secondary | ICD-10-CM | POA: Diagnosis not present

## 2024-03-08 DIAGNOSIS — N401 Enlarged prostate with lower urinary tract symptoms: Secondary | ICD-10-CM | POA: Diagnosis not present

## 2024-03-08 DIAGNOSIS — R8271 Bacteriuria: Secondary | ICD-10-CM | POA: Diagnosis not present

## 2024-03-08 DIAGNOSIS — R351 Nocturia: Secondary | ICD-10-CM | POA: Diagnosis not present

## 2024-03-09 ENCOUNTER — Ambulatory Visit (INDEPENDENT_AMBULATORY_CARE_PROVIDER_SITE_OTHER): Admitting: Internal Medicine

## 2024-03-09 ENCOUNTER — Encounter: Payer: Self-pay | Admitting: Internal Medicine

## 2024-03-09 VITALS — BP 126/68 | HR 85 | Temp 98.0°F | Resp 18 | Ht 72.5 in | Wt 253.0 lb

## 2024-03-09 DIAGNOSIS — Z9889 Other specified postprocedural states: Secondary | ICD-10-CM

## 2024-03-09 DIAGNOSIS — R319 Hematuria, unspecified: Secondary | ICD-10-CM | POA: Diagnosis not present

## 2024-03-09 DIAGNOSIS — R5082 Postprocedural fever: Secondary | ICD-10-CM | POA: Diagnosis not present

## 2024-03-09 DIAGNOSIS — N390 Urinary tract infection, site not specified: Secondary | ICD-10-CM | POA: Diagnosis not present

## 2024-03-09 NOTE — Progress Notes (Unsigned)
 Subjective:    Patient ID: Steven Munoz, male    DOB: 1941-10-14, 83 y.o.   MRN: [id number]  DOS:  03/09/2024 Type of visit - description: acute, here with his wife  The patient was referred to [facility] urology due to gross hematuria. 5/6: Had a cystoscopy, reportedly okay 5/8: Started to run fever and chills. 5/12: Went to urology, urine was checked, received a parenteral dose of Levaquin and Rx Levaquin. The pharmacy advised him not to take it because the number of potential side effects.  Today he feels okay, no fever or chills but reports that usually that comes in the afternoons. Has urinary urgency and frequency. No further gross hematuria No nausea vomiting. He does have some lower abdominal discomfort  Review of Systems See above   Past Medical History:  Diagnosis Date   Allergy 2004   Tape,   Arthritis    CAD (coronary artery disease) CABG  05/29/09    OPERATIVE PROCEDURES:  Median sternotomy, extracorporeal circulation,    Cataract    Colonic polyp 2002, 2005, 2009   Dr Andriette Keeling   Dermatophytosis of nail    Diverticulosis    Dysmetabolic syndrome    GERD (gastroesophageal reflux disease)    S/P dilation  2005   Gout    Hyperlipidemia    Hypertension    Hypothyroidism    Melanoma (HCC) 08/22/2023   Murmur    Nephrolithiasis    X 1   Peripheral neuropathy 03/20/2021   Pneumonia age 26   treated as inpatient   Sleep apnea    on CPAP; Dr Villa Greaser    Past Surgical History:  Procedure Laterality Date   athroscopy     right knee X2, left knee X1   CARDIAC CATHETERIZATION     COLONOSCOPY  2002 & 2009 &2014   polypectomy; Dr Andriette Keeling   CORONARY ARTERY BYPASS GRAFT  2010   Median sternotomy, extracorporeal circulation, coronary bypass graft surgery x3 using a left internal mammary artery graft to left anterior descending coronary artery, with a sequential  saphenous vein graft to posterior descending and posterolateral branches  of the right coronary artery.   Endoscopic vein harvesting from the right   EYE SURGERY Right    tear duct   JOINT REPLACEMENT  Right knee 2004, right hip 2015   LEFT HEART CATH AND CORONARY ANGIOGRAPHY N/A 01/26/2023   Procedure: Coronary/Graft Acute MI Revascularization;  Surgeon: Arleen Lacer, MD;  Location: [facility] INVASIVE CV LAB;  Service: Cardiovascular;  Laterality: N/A;   Nail Alvusion Bilateral    Hallux   NOSE SURGERY  11/20/2012   L max antrectomy;septoplasty;turbinate reduction. Dr Thompson Flight   REPLACEMENT TOTAL KNEE     right 2003   REPLACEMENT TOTAL KNEE     partial Left March 2009   TENDON RELEASE Right 03/2016   from R hip. @ [name]   TOTAL HIP ARTHROPLASTY  02/22/2014   right hip; Dr [facility]    Current Outpatient Medications  Medication Instructions   allopurinol  (ZYLOPRIM ) 100 mg, Oral, Daily   aspirin  EC 81 mg, Daily   clopidogrel  (PLAVIX ) 75 mg, Oral, Daily with breakfast   Coenzyme Q10 (COQ10 PO) 1 capsule, Daily   colestipol  (COLESTID ) 1 g, Daily   Cyanocobalamin  (VITAMIN B-12 PO) 1 tablet, Daily   dapagliflozin  propanediol (FARXIGA ) 10 mg, Oral, Daily   famotidine  (PEPCID ) 40 mg, Daily at bedtime   furosemide  (LASIX ) 20 mg, Oral, Daily PRN   hydrALAZINE  (APRESOLINE ) 10 mg, Oral,  2 times daily PRN, Take one tablet as needed for systolic BP >150   levothyroxine  (SYNTHROID ) 125 mcg, Oral, Daily before breakfast   magnesium  oxide (MAG-OX) 400 mg, Daily at bedtime   metoprolol  succinate (TOPROL -XL) 25 mg, Oral, Daily   Multiple Vitamins-Minerals (PRESERVISION AREDS 2+MULTI VIT) CAPS 1 capsule, 2 times daily   nitroGLYCERIN  (NITROSTAT ) 0.4 mg, Sublingual, Every 5 min PRN   Omega-3 Fatty Acids (FISH OIL PO) 1 capsule, Daily   rosuvastatin  (CRESTOR ) 40 mg, Oral, Daily   sacubitril -valsartan  (ENTRESTO ) 49-51 MG 1 tablet, Oral, 2 times daily   spironolactone  (ALDACTONE ) 12.5 mg, Oral, Daily       Objective:   Physical Exam BP 126/68   Pulse 85   Temp 98 F (36.7 C) (Oral)    Resp 18   Ht 6' 0.5" (1.842 m)   Wt 253 lb (114.8 kg)   SpO2 98%   BMI 33.84 kg/m  General:   Well developed, NAD, BMI noted. HEENT:  Normocephalic . Face symmetric, atraumatic Abdomen: Slightly tender at the lower abdomen without mass or rebound. Lower extremities: no pretibial edema bilaterally  Skin: Not pale. Not jaundice Neurologic:  alert & oriented X3.  Speech normal, gait appropriate for age and unassisted Psych--  Cognition and judgment appear intact.  Cooperative with normal attention span and concentration.  Behavior appropriate. No anxious or depressed appearing.      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  No clear evidence of radiculopathy. 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  Dr. 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  Subsequently the patient reported gross hematuria and was referred to urology. Had cystoscopy on 03/02/2024 and subsequently developed fever. Yesterday urology saw the patient, got a UA, got a shot of Levaquin and prescribed Levaquin but is reluctant to take because the pharmacy said potential for side effects. We went over the benefits and downsides of an antibiotic such as Levaquin.  The patient does not have a aneurysm that I know of but he has neuropathy and a remote history of a single episode of  A-fib. Plan: He already called urology asking for a change on antibiotic--- although I think it would be okay for him to take Levaquin for few days.  He will reach out tomorrow if needs help. Will try to get urology records  Time spent 32 minutes, multiple questions about UTI, urine culture, antibiotic answered to the best of my ability

## 2024-03-10 NOTE — Assessment & Plan Note (Signed)
 Gross hematuria, postprocedural UTI: Was seen at this office 02/17/2024, complaining of LUTS, labs were okay.  Subsequently the patient reported gross hematuria and was referred to urology. Had cystoscopy on 03/02/2024 and subsequently developed fever. Yesterday urology saw the patient, got a UA, got a shot of Levaquin and prescribed Levaquin but is reluctant to take because the pharmacy said potential for side effects. We went over the benefits and downsides of an antibiotic such as Levaquin.  The patient does not have a aneurysm that I know of but he has neuropathy and a remote history of a single episode of A-fib. Plan: He already called urology asking for a change on antibiotic--- although I think it would be okay for him to take Levaquin for few days.  He will reach out tomorrow if needs help. Will try to get urology records

## 2024-03-11 DIAGNOSIS — L812 Freckles: Secondary | ICD-10-CM | POA: Diagnosis not present

## 2024-03-11 DIAGNOSIS — Z85820 Personal history of malignant melanoma of skin: Secondary | ICD-10-CM | POA: Diagnosis not present

## 2024-03-11 DIAGNOSIS — L814 Other melanin hyperpigmentation: Secondary | ICD-10-CM | POA: Diagnosis not present

## 2024-03-11 DIAGNOSIS — D692 Other nonthrombocytopenic purpura: Secondary | ICD-10-CM | POA: Diagnosis not present

## 2024-03-11 DIAGNOSIS — D225 Melanocytic nevi of trunk: Secondary | ICD-10-CM | POA: Diagnosis not present

## 2024-03-11 DIAGNOSIS — D3617 Benign neoplasm of peripheral nerves and autonomic nervous system of trunk, unspecified: Secondary | ICD-10-CM | POA: Diagnosis not present

## 2024-03-11 DIAGNOSIS — L821 Other seborrheic keratosis: Secondary | ICD-10-CM | POA: Diagnosis not present

## 2024-03-11 DIAGNOSIS — D1801 Hemangioma of skin and subcutaneous tissue: Secondary | ICD-10-CM | POA: Diagnosis not present

## 2024-03-13 ENCOUNTER — Other Ambulatory Visit: Payer: Self-pay | Admitting: Cardiovascular Disease

## 2024-03-15 DIAGNOSIS — S46012D Strain of muscle(s) and tendon(s) of the rotator cuff of left shoulder, subsequent encounter: Secondary | ICD-10-CM | POA: Diagnosis not present

## 2024-03-15 DIAGNOSIS — M25612 Stiffness of left shoulder, not elsewhere classified: Secondary | ICD-10-CM | POA: Diagnosis not present

## 2024-03-15 DIAGNOSIS — M6281 Muscle weakness (generalized): Secondary | ICD-10-CM | POA: Diagnosis not present

## 2024-03-15 DIAGNOSIS — M19012 Primary osteoarthritis, left shoulder: Secondary | ICD-10-CM | POA: Diagnosis not present

## 2024-03-22 ENCOUNTER — Other Ambulatory Visit: Payer: Self-pay

## 2024-03-22 ENCOUNTER — Emergency Department (HOSPITAL_COMMUNITY)

## 2024-03-22 ENCOUNTER — Observation Stay (HOSPITAL_COMMUNITY)
Admission: EM | Admit: 2024-03-22 | Discharge: 2024-03-25 | Disposition: A | Discharge status: Home / Self Care | Attending: Emergency Medicine | Admitting: Emergency Medicine

## 2024-03-22 ENCOUNTER — Encounter (HOSPITAL_COMMUNITY): Payer: Self-pay

## 2024-03-22 DIAGNOSIS — Z7982 Long term (current) use of aspirin: Secondary | ICD-10-CM | POA: Insufficient documentation

## 2024-03-22 DIAGNOSIS — Z87891 Personal history of nicotine dependence: Secondary | ICD-10-CM | POA: Diagnosis not present

## 2024-03-22 DIAGNOSIS — I10 Essential (primary) hypertension: Secondary | ICD-10-CM | POA: Diagnosis not present

## 2024-03-22 DIAGNOSIS — K8020 Calculus of gallbladder without cholecystitis without obstruction: Secondary | ICD-10-CM | POA: Diagnosis not present

## 2024-03-22 DIAGNOSIS — Z7902 Long term (current) use of antithrombotics/antiplatelets: Secondary | ICD-10-CM | POA: Diagnosis not present

## 2024-03-22 DIAGNOSIS — K5730 Diverticulosis of large intestine without perforation or abscess without bleeding: Secondary | ICD-10-CM | POA: Diagnosis not present

## 2024-03-22 DIAGNOSIS — R079 Chest pain, unspecified: Principal | ICD-10-CM | POA: Diagnosis present

## 2024-03-22 DIAGNOSIS — Z85828 Personal history of other malignant neoplasm of skin: Secondary | ICD-10-CM | POA: Insufficient documentation

## 2024-03-22 DIAGNOSIS — R935 Abnormal findings on diagnostic imaging of other abdominal regions, including retroperitoneum: Secondary | ICD-10-CM | POA: Diagnosis not present

## 2024-03-22 DIAGNOSIS — R072 Precordial pain: Principal | ICD-10-CM

## 2024-03-22 DIAGNOSIS — I2510 Atherosclerotic heart disease of native coronary artery without angina pectoris: Secondary | ICD-10-CM | POA: Insufficient documentation

## 2024-03-22 DIAGNOSIS — K8012 Calculus of gallbladder with acute and chronic cholecystitis without obstruction: Secondary | ICD-10-CM | POA: Diagnosis not present

## 2024-03-22 DIAGNOSIS — K828 Other specified diseases of gallbladder: Secondary | ICD-10-CM | POA: Diagnosis not present

## 2024-03-22 DIAGNOSIS — K810 Acute cholecystitis: Secondary | ICD-10-CM | POA: Diagnosis present

## 2024-03-22 DIAGNOSIS — E785 Hyperlipidemia, unspecified: Secondary | ICD-10-CM | POA: Diagnosis not present

## 2024-03-22 DIAGNOSIS — Z955 Presence of coronary angioplasty implant and graft: Secondary | ICD-10-CM | POA: Insufficient documentation

## 2024-03-22 DIAGNOSIS — Z96653 Presence of artificial knee joint, bilateral: Secondary | ICD-10-CM | POA: Insufficient documentation

## 2024-03-22 DIAGNOSIS — Z96641 Presence of right artificial hip joint: Secondary | ICD-10-CM | POA: Diagnosis not present

## 2024-03-22 DIAGNOSIS — E039 Hypothyroidism, unspecified: Secondary | ICD-10-CM | POA: Diagnosis not present

## 2024-03-22 DIAGNOSIS — R1011 Right upper quadrant pain: Secondary | ICD-10-CM | POA: Diagnosis not present

## 2024-03-22 DIAGNOSIS — R918 Other nonspecific abnormal finding of lung field: Secondary | ICD-10-CM | POA: Diagnosis not present

## 2024-03-22 DIAGNOSIS — Z951 Presence of aortocoronary bypass graft: Secondary | ICD-10-CM | POA: Diagnosis not present

## 2024-03-22 DIAGNOSIS — Z79899 Other long term (current) drug therapy: Secondary | ICD-10-CM | POA: Insufficient documentation

## 2024-03-22 DIAGNOSIS — R9389 Abnormal findings on diagnostic imaging of other specified body structures: Secondary | ICD-10-CM | POA: Diagnosis not present

## 2024-03-22 DIAGNOSIS — Z6839 Body mass index (BMI) 39.0-39.9, adult: Secondary | ICD-10-CM | POA: Insufficient documentation

## 2024-03-22 DIAGNOSIS — G4733 Obstructive sleep apnea (adult) (pediatric): Secondary | ICD-10-CM

## 2024-03-22 DIAGNOSIS — E669 Obesity, unspecified: Secondary | ICD-10-CM | POA: Diagnosis not present

## 2024-03-22 DIAGNOSIS — R0789 Other chest pain: Secondary | ICD-10-CM | POA: Diagnosis not present

## 2024-03-22 DIAGNOSIS — R1013 Epigastric pain: Secondary | ICD-10-CM | POA: Diagnosis not present

## 2024-03-22 LAB — BASIC METABOLIC PANEL WITH GFR
Anion gap: 9 (ref 5–15)
BUN: 15 mg/dL (ref 8–23)
CO2: 26 mmol/L (ref 22–32)
Calcium: 9.4 mg/dL (ref 8.9–10.3)
Chloride: 103 mmol/L (ref 98–111)
Creatinine, Ser: 0.78 mg/dL (ref 0.61–1.24)
GFR, Estimated: >60 mL/min (ref >60)
Glucose, Bld: 136 mg/dL — ABNORMAL HIGH (ref 70–99)
Potassium: 4 mmol/L (ref 3.5–5.1)
Sodium: 138 mmol/L (ref 135–145)

## 2024-03-22 LAB — CBC
HCT: 47.3 % (ref 39.0–52.0)
Hemoglobin: 15.8 g/dL (ref 13.0–17.0)
MCH: 32.3 pg (ref 26.0–34.0)
MCHC: 33.4 g/dL (ref 30.0–36.0)
MCV: 96.7 fL (ref 80.0–100.0)
Platelets: 237 10*3/uL (ref 150–400)
RBC: 4.89 MIL/uL (ref 4.22–5.81)
RDW: 13.5 % (ref 11.5–15.5)
WBC: 8.2 10*3/uL (ref 4.0–10.5)
nRBC: 0 % (ref 0.0–0.2)

## 2024-03-22 LAB — HEPATIC FUNCTION PANEL
ALT: 44 U/L (ref 0–44)
AST: 33 U/L (ref 15–41)
Albumin: 3.5 g/dL (ref 3.5–5.0)
Alkaline Phosphatase: 55 U/L (ref 38–126)
Bilirubin, Direct: 0.2 mg/dL (ref 0.0–0.2)
Indirect Bilirubin: 0.8 mg/dL (ref 0.3–0.9)
Total Bilirubin: 1 mg/dL (ref 0.0–1.2)
Total Protein: 6.1 g/dL — ABNORMAL LOW (ref 6.5–8.1)

## 2024-03-22 LAB — LIPASE, BLOOD: Lipase: 37 U/L (ref 11–51)

## 2024-03-22 LAB — TROPONIN I (HIGH SENSITIVITY)
Troponin I (High Sensitivity): 6 ng/L (ref <18)
Troponin I (High Sensitivity): 6 ng/L (ref <18)

## 2024-03-22 MED ORDER — ONDANSETRON HCL 4 MG/2ML IJ SOLN
4.0000 mg | Freq: Four times a day (QID) | INTRAMUSCULAR | Status: DC | PRN
  Administered 2024-03-22 (×2): 4 mg via INTRAVENOUS
  Filled 2024-03-22 (×3): qty 2

## 2024-03-22 MED ORDER — MORPHINE SULFATE (PF) 2 MG/ML IV SOLN
2.0000 mg | Freq: Once | INTRAVENOUS | Status: AC
  Administered 2024-03-22: 2 mg via INTRAVENOUS
  Filled 2024-03-22: qty 1

## 2024-03-22 MED ORDER — ENOXAPARIN SODIUM 40 MG/0.4ML IJ SOSY
40.0000 mg | PREFILLED_SYRINGE | INTRAMUSCULAR | Status: DC
  Administered 2024-03-22 – 2024-03-25 (×3): 40 mg via SUBCUTANEOUS
  Filled 2024-03-22 (×3): qty 0.4

## 2024-03-22 MED ORDER — CEFTRIAXONE SODIUM 2 G IJ SOLR
2.0000 g | INTRAMUSCULAR | Status: DC
  Administered 2024-03-22 – 2024-03-25 (×4): 2 g via INTRAVENOUS
  Filled 2024-03-22 (×4): qty 20

## 2024-03-22 MED ORDER — SACUBITRIL-VALSARTAN 49-51 MG PO TABS
1.0000 | ORAL_TABLET | Freq: Two times a day (BID) | ORAL | Status: DC
Start: 2024-03-22 — End: 2024-03-25
  Administered 2024-03-23 – 2024-03-25 (×6): 1 via ORAL
  Filled 2024-03-22 (×9): qty 1

## 2024-03-22 MED ORDER — SODIUM CHLORIDE 0.9 % IV SOLN: INTRAVENOUS | Status: AC

## 2024-03-22 MED ORDER — ASPIRIN 81 MG PO TBEC
81.0000 mg | DELAYED_RELEASE_TABLET | Freq: Every day | ORAL | Status: DC
  Administered 2024-03-23 – 2024-03-24 (×3): 81 mg via ORAL
  Filled 2024-03-22 (×5): qty 1

## 2024-03-22 MED ORDER — METOPROLOL SUCCINATE ER 25 MG PO TB24
25.0000 mg | ORAL_TABLET | Freq: Every day | ORAL | Status: DC
Start: 2024-03-22 — End: 2024-03-25
  Administered 2024-03-22 – 2024-03-24 (×3): 25 mg via ORAL
  Filled 2024-03-22 (×3): qty 1

## 2024-03-22 MED ORDER — CALCIUM CARBONATE ANTACID 500 MG PO CHEW
1.0000 | CHEWABLE_TABLET | Freq: Three times a day (TID) | ORAL | Status: DC
  Administered 2024-03-22 – 2024-03-25 (×8): 200 mg via ORAL
  Filled 2024-03-22 (×8): qty 1

## 2024-03-22 MED ORDER — ACETAMINOPHEN 500 MG PO TABS
1000.0000 mg | ORAL_TABLET | Freq: Four times a day (QID) | ORAL | Status: DC
  Administered 2024-03-22 – 2024-03-25 (×8): 1000 mg via ORAL
  Filled 2024-03-22 (×9): qty 2

## 2024-03-22 MED ORDER — ONDANSETRON HCL 4 MG/2ML IJ SOLN
4.0000 mg | Freq: Once | INTRAMUSCULAR | Status: AC
  Administered 2024-03-22: 4 mg via INTRAVENOUS
  Filled 2024-03-22: qty 2

## 2024-03-22 MED ORDER — LEVOTHYROXINE SODIUM 25 MCG PO TABS
125.0000 ug | ORAL_TABLET | Freq: Every day | ORAL | Status: DC
  Administered 2024-03-23 – 2024-03-25 (×3): 125 ug via ORAL
  Filled 2024-03-22 (×3): qty 1

## 2024-03-22 MED ORDER — ALUM & MAG HYDROXIDE-SIMETH 200-200-20 MG/5ML PO SUSP
30.0000 mL | Freq: Once | ORAL | Status: AC
  Administered 2024-03-22: 30 mL via ORAL
  Filled 2024-03-22: qty 30

## 2024-03-22 MED ORDER — MORPHINE SULFATE (PF) 4 MG/ML IV SOLN
4.0000 mg | Freq: Once | INTRAVENOUS | Status: AC
  Administered 2024-03-22: 4 mg via INTRAVENOUS
  Filled 2024-03-22: qty 1

## 2024-03-22 MED ORDER — ROSUVASTATIN CALCIUM 20 MG PO TABS
40.0000 mg | ORAL_TABLET | Freq: Every day | ORAL | Status: DC
  Administered 2024-03-23 (×2): 40 mg via ORAL
  Filled 2024-03-22 (×4): qty 2

## 2024-03-22 MED ORDER — IOHEXOL 350 MG/ML SOLN
75.0000 mL | Freq: Once | INTRAVENOUS | Status: AC | PRN
  Administered 2024-03-22: 75 mL via INTRAVENOUS

## 2024-03-22 MED ORDER — CLOPIDOGREL BISULFATE 75 MG PO TABS
75.0000 mg | ORAL_TABLET | Freq: Every day | ORAL | Status: DC
Start: 2024-03-23 — End: 2024-03-22

## 2024-03-22 MED ORDER — PANTOPRAZOLE SODIUM 40 MG PO TBEC
40.0000 mg | DELAYED_RELEASE_TABLET | Freq: Every day | ORAL | Status: DC
  Administered 2024-03-23 – 2024-03-24 (×3): 40 mg via ORAL
  Filled 2024-03-22 (×5): qty 1

## 2024-03-22 NOTE — ED Provider Notes (Signed)
 [facility] EMERGENCY DEPARTMENT AT [facility] Provider Note   CSN: [id number] Arrival date & time: 03/22/24  0036     History  Chief Complaint  Patient presents with   Chest Pain    Steven Munoz is a 83 y.o. male.  HPI     This is an 83 year old male with history of coronary artery disease who presents with chest pain.  Patient reports that he had feelings of indigestion tonight around 7 PM.  He states that he had a similar sensation last night when he got up to go to the bathroom.  He has had a previous heart attack and states that at that time he felt tightness so this does feel somewhat different.  He did not take anything for symptoms prior to coming.  He noted that his blood pressure was elevated at home.  Has not had any nausea, vomiting.  Denies shortness of breath or sweating.  Home Medications Prior to Admission medications   Medication Sig Start Date End Date Taking? Authorizing Provider  allopurinol  (ZYLOPRIM ) 100 MG tablet Take 1 tablet (100 mg total) by mouth daily. 01/07/24   Matt Song, MD  aspirin  EC 81 MG tablet Take 81 mg by mouth daily.    [provider]  clopidogrel  (PLAVIX ) 75 MG tablet TAKE 1 TABLET BY MOUTH DAILY WITH BREAKFAST. 11/20/23   Nishan, Peter C, MD  Coenzyme Q10 (COQ10 PO) Take 1 capsule by mouth daily.    [provider]  colestipol  (COLESTID ) 1 g tablet Take 1 g by mouth daily. 06/12/22   [provider]  Cyanocobalamin  (VITAMIN B-12 PO) Take 1 tablet by mouth daily.    [provider]  dapagliflozin  propanediol (FARXIGA ) 10 MG TABS tablet Take 1 tablet (10 mg total) by mouth daily. 02/23/24   Nishan, Peter C, MD  famotidine  (PEPCID ) 40 MG tablet Take 40 mg by mouth at bedtime.    [provider]  furosemide  (LASIX ) 20 MG tablet Take 1 tablet (20 mg total) by mouth daily as needed. Patient not taking: Reported on 01/07/2024 01/30/23 01/30/24  Johnie Nailer B, NP  hydrALAZINE   (APRESOLINE ) 10 MG tablet Take 1 tablet (10 mg total) by mouth 2 (two) times daily as needed. Take one tablet as needed for systolic BP >150 Patient not taking: Reported on 01/07/2024 02/06/23 02/06/24  Sanjuanita Cruz, NP  levothyroxine  (SYNTHROID ) 125 MCG tablet Take 1 tablet (125 mcg total) by mouth daily before breakfast. 02/16/24   Paz, Jose E, MD  magnesium  oxide (MAG-OX) 400 (240 Mg) MG tablet Take 400 mg by mouth at bedtime.    [provider]  metoprolol  succinate (TOPROL -XL) 25 MG 24 hr tablet TAKE 1 TABLET (25 MG TOTAL) BY MOUTH DAILY. 02/16/24   Nishan, Peter C, MD  Multiple Vitamins-Minerals (PRESERVISION AREDS 2+MULTI VIT) CAPS Take 1 capsule by mouth 2 (two) times daily.     [provider]  nitroGLYCERIN  (NITROSTAT ) 0.4 MG SL tablet PLACE 1 TABLET UNDER THE TONGUE EVERY 5 MINUTES AS NEEDED. 03/15/24   Loyde Rule, MD  Omega-3 Fatty Acids (FISH OIL PO) Take 1 capsule by mouth daily.    [provider]  rosuvastatin  (CRESTOR ) 40 MG tablet TAKE 1 TABLET BY MOUTH EVERY DAY 07/08/23   Nishan, Peter C, MD  sacubitril -valsartan  (ENTRESTO ) 49-51 MG Take 1 tablet by mouth 2 (two) times daily. 10/13/23   Nishan, Peter C, MD  spironolactone  (ALDACTONE ) 25 MG tablet Take 0.5 tablets (12.5 mg  total) by mouth daily. 12/01/23   Arleene Belt, PA-C      Allergies    Povidone-iodine, Ofloxacin , and Tape    Review of Systems   Review of Systems  Constitutional:  Negative for fever.  Respiratory:  Negative for shortness of breath.   Cardiovascular:  Positive for chest pain.  Gastrointestinal:  Negative for abdominal pain, nausea and vomiting.  All other systems reviewed and are negative.   Physical Exam Updated Vital Signs BP (!) 151/75   Pulse 63   Temp 97.8 F (36.6 C) (Oral)   Resp 18   Ht 1.829 m (6')   Wt 108.9 kg   SpO2 100%   BMI 32.55 kg/m  Physical Exam Vitals and nursing note reviewed.  Constitutional:      Appearance: He is  well-developed. He is not ill-appearing.  HENT:     Head: Normocephalic and atraumatic.  Eyes:     Pupils: Pupils are equal, round, and reactive to light.  Cardiovascular:     Rate and Rhythm: Normal rate and regular rhythm.     Heart sounds: Normal heart sounds. No murmur heard. Pulmonary:     Effort: Pulmonary effort is normal. No respiratory distress.     Breath sounds: Normal breath sounds. No wheezing.  Abdominal:     General: Bowel sounds are normal.     Palpations: Abdomen is soft.     Tenderness: There is abdominal tenderness. There is no rebound.     Comments: Mild epigastric tenderness palpation, no rebound or guarding  Musculoskeletal:     Cervical back: Neck supple.  Lymphadenopathy:     Cervical: No cervical adenopathy.  Skin:    General: Skin is warm and dry.  Neurological:     Mental Status: He is alert and oriented to person, place, and time.  Psychiatric:        Mood and Affect: Mood normal.     ED Results / Procedures / Treatments   Labs (all labs ordered are listed, but only abnormal results are displayed) Labs Reviewed  BASIC METABOLIC PANEL WITH GFR - Abnormal; Notable for the following components:      Result Value   Glucose, Bld 136 (*)    All other components within normal limits  HEPATIC FUNCTION PANEL - Abnormal; Notable for the following components:   Total Protein 6.1 (*)    All other components within normal limits  CBC  LIPASE, BLOOD  TROPONIN I (HIGH SENSITIVITY)  TROPONIN I (HIGH SENSITIVITY)    EKG EKG Interpretation Date/Time:  Monday Mar 22 2024 00:45:39 EDT Ventricular Rate:  61 PR Interval:  222 QRS Duration:  92 QT Interval:  396 QTC Calculation: 398 R Axis:   39  Text Interpretation: Sinus rhythm with 1st degree A-V block Nonspecific ST and T wave abnormality Abnormal ECG When compared with ECG of 13-Feb-2023 14:39, PREVIOUS ECG IS PRESENT Confirmed by Donita Furrow ([id number]) on 03/22/2024 1:44:32 AM  Radiology CT  ABDOMEN PELVIS W CONTRAST Result Date: 03/22/2024 CLINICAL DATA:  Acute, nonlocalized abdominal pain EXAM: CT ABDOMEN AND PELVIS WITH CONTRAST TECHNIQUE: Multidetector CT imaging of the abdomen and pelvis was performed using the standard protocol following bolus administration of intravenous contrast. RADIATION DOSE REDUCTION: This exam was performed according to the departmental dose-optimization program which includes automated exposure control, adjustment of the mA and/or kV according to patient size and/or use of iterative reconstruction technique. CONTRAST:  75mL OMNIPAQUE  IOHEXOL  350 MG/ML SOLN COMPARISON:  04/10/2009 FINDINGS: Lower chest:  Extensive coronary atherosclerosis. Hepatobiliary: No focal liver abnormality.Numerous gallstones. No evidence of acute cholecystitis Pancreas: Unremarkable. Spleen: Numerous granulomatous type calcification. Adrenals/Urinary Tract: Negative adrenals. No hydronephrosis or stone. Unremarkable bladder. Stomach/Bowel: No obstruction. Lax ileocolic mesentery, cecum in the epigastrium. Multiple distal colonic diverticula. Vascular/Lymphatic: No acute vascular abnormality. Scattered atheromatous calcification, moderately extensive. No mass or adenopathy. Reproductive:No pathologic findings. Other: No ascites or pneumoperitoneum. Musculoskeletal: No acute abnormalities. Advanced lumbar spine degeneration with scoliosis. Right hip replacement. IMPRESSION: No acute finding. Atherosclerosis, cholelithiasis, and colonic diverticulosis. Electronically Signed   By: Ronnette Coke M.D.   On: 03/22/2024 06:13   DG Chest 2 View Result Date: 03/22/2024 CLINICAL DATA:  Chest pain. EXAM: CHEST - 2 VIEW COMPARISON:  February 06, 2023 FINDINGS: Multiple sternal wires and vascular clips are seen. The heart size and mediastinal contours are within normal limits. Low lung volumes are noted with mild, stable elevation of the right hemidiaphragm. Mild left basilar atelectasis and/or infiltrate is  noted. No pleural effusion or pneumothorax is identified. Multilevel degenerative changes seen throughout the thoracic spine. IMPRESSION: 1. Evidence of prior median sternotomy/CABG. 2. Mild left basilar atelectasis and/or infiltrate. Electronically Signed   By: Virgle Grime M.D.   On: 03/22/2024 01:20    Procedures Procedures    Medications Ordered in ED Medications  morphine (PF) 4 MG/ML injection 4 mg (has no administration in time range)  alum & mag hydroxide-simeth (MAALOX/MYLANTA) 200-200-20 MG/5ML suspension 30 mL (30 mLs Oral Given 03/22/24 0350)  ondansetron  (ZOFRAN ) injection 4 mg (4 mg Intravenous Given 03/22/24 0443)  iohexol  (OMNIPAQUE ) 350 MG/ML injection 75 mL (75 mLs Intravenous Contrast Given 03/22/24 0531)    ED Course/ Medical Decision Making/ A&P Clinical Course as of 03/22/24 2951  Mon Mar 22, 2024  0440 Patient now states he feels quite uncomfortable.  Has persistent nausea.  Does have persistent tenderness to palpation in the epigastrium.  Will give Zofran  and order CT abdomen pelvis. [CH]  [phone] Patient reports some persistent nausea.  CT scan does not show any acute pathology.  He does have gallstones but no white count, LFTs, or lipase. [CH]    Clinical Course User Index [CH] Ruben Mahler, Vonzella Guernsey, MD                                 Medical Decision Making Amount and/or Complexity of Data Reviewed Labs: ordered. Radiology: ordered.  Risk OTC drugs. Prescription drug management.   This patient presents to the ED for concern of chest pain, this involves an extensive number of treatment options, and is a complaint that carries with it a high risk of complications and morbidity.  I considered the following differential and admission for this acute, potentially life threatening condition.  The differential diagnosis includes ACS, PE, pneumothorax, pneumonia, GI etiology such as gastritis, gastroenteritis, pancreatitis, cholecystitis  MDM:    This is an  83 year old male who presents with chest pain.  He also also having some epigastric discomfort.  He is nontoxic.  Vital signs are reassuring.  Mild epigastric tenderness to palpation on exam.  EKG shows no evidence of acute ischemia or arrhythmia.  Chest x-ray without pneumonia or pneumothorax.  Troponin x 2 negative.  LFTs and lipase normal.  No leukocytosis.  Patient having progressive nausea and increasing epigastric discomfort.  Was given a GI cocktail.  CT scan obtained.  CT is largely unremarkable.  He does have gallstones but no evidence of  gallbladder inflammation.  On recheck, he has continued nausea.  Offered right upper quadrant ultrasound imaging to better characterize bladder given ongoing nausea and epigastric discomfort.  (Labs, imaging, consults)  Labs: I Ordered, and personally interpreted labs.  The pertinent results include: CBC, CMP, lipase, urinalysis, troponin x 2  Imaging Studies ordered: I ordered imaging studies including chest x-ray, CT abdomen pelvis, right upper quadrant ultrasound pending I independently visualized and interpreted imaging. I agree with the radiologist interpretation  Additional history obtained from chart review.  External records from outside source obtained and reviewed including prior evaluations  Cardiac Monitoring: The patient was maintained on a cardiac monitor.  If on the cardiac monitor, I personally viewed and interpreted the cardiac monitored which showed an underlying rhythm of: Sinus  Reevaluation: After the interventions noted above, I reevaluated the patient and found that they have :stayed the same  Social Determinants of Health:  lives independently  Disposition: Pending right upper quadrant ultrasound, signed out to oncoming provider  Co morbidities that complicate the patient evaluation  Past Medical History:  Diagnosis Date   Allergy 2004   Tape,   Arthritis    CAD (coronary artery disease) CABG  05/29/09    OPERATIVE  PROCEDURES:  Median sternotomy, extracorporeal circulation,    Cataract    Colonic polyp 2002, 2005, 2009   Dr Andriette Keeling   Dermatophytosis of nail    Diverticulosis    Dysmetabolic syndrome    GERD (gastroesophageal reflux disease)    S/P dilation  2005   Gout    Hyperlipidemia    Hypertension    Hypothyroidism    Melanoma (HCC) 08/22/2023   Murmur    Nephrolithiasis    X 1   Peripheral neuropathy 03/20/2021   Pneumonia age 2   treated as inpatient   Sleep apnea    on CPAP; Dr Villa Greaser     Medicines Meds ordered this encounter  Medications   alum & mag hydroxide-simeth (MAALOX/MYLANTA) 200-200-20 MG/5ML suspension 30 mL   ondansetron  (ZOFRAN ) injection 4 mg   iohexol  (OMNIPAQUE ) 350 MG/ML injection 75 mL   morphine (PF) 4 MG/ML injection 4 mg    I have reviewed the patients home medicines and have made adjustments as needed  Problem List / ED Course: Problem List Items Addressed This Visit   None               Final Clinical Impression(s) / ED Diagnoses Final diagnoses:  None    Rx / DC Orders ED Discharge Orders     None         Rory Collard, MD 03/22/24 [phone]

## 2024-03-22 NOTE — Care Management Obs Status (Signed)
 MEDICARE OBSERVATION STATUS NOTIFICATION   Patient Details  Name: Steven Munoz MRN: [id number] Date of Birth: [birth year]   Medicare Observation Status Notification Given:       Janith Melnick 03/22/2024, 1:43 PM

## 2024-03-22 NOTE — Progress Notes (Signed)
 Patient unable to tolerate PO meds despite IV zofran . Continues to dry heave and unable to drink water.  Refuses to take PO tylenol  for pain, pt is requesting IV pain medication and IV fluids. Dr. Arne Langdon notified, waiting for new orders. Report given to night shift nurse.

## 2024-03-22 NOTE — ED Triage Notes (Signed)
 Pt complaining of pain in the center of the chest that started around 7 pm. He thought it was indigestion. His blood pressure started going up and he said he needed to be checked. 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

## 2024-03-22 NOTE — ED Notes (Signed)
Transport called to take pt upstairs 

## 2024-03-22 NOTE — ED Notes (Signed)
 assisted to the restroom via wheelchair

## 2024-03-22 NOTE — ED Provider Notes (Signed)
 Blood pressure 132/72, pulse (!) 59, temperature 97.9 F (36.6 C), resp. rate 18, height 6' (1.829 m), weight 108.9 kg, SpO2 100%.  Assuming care from Dr. Carylon Claude.  In short, Steven Munoz is a 83 y.o. male with a chief complaint of Chest Pain .  Refer to the original H&P for additional details.  The current plan of care is to follow up on RUS US  and reassess.   EKG Interpretation Date/Time:  Monday Mar 22 2024 00:45:39 EDT Ventricular Rate:  61 PR Interval:  222 QRS Duration:  92 QT Interval:  396 QTC Calculation: 398 R Axis:   39  Text Interpretation: Sinus rhythm with 1st degree A-V block Nonspecific ST and T wave abnormality Abnormal ECG When compared with ECG of 13-Feb-2023 14:39, PREVIOUS ECG IS PRESENT Confirmed by Donita Furrow ([id number]) on 03/22/2024 1:44:32 AM        08:19 AM Patient's ultrasound shows some gallbladder wall thickening with cholelithiasis.  No pericholecystic fluid.  On my evaluation the patient appears more comfortable although still having some discomfort and nausea sensation.  He has tried drinking a little bit of fluid and eating some crackers but symptoms persist despite morphine.  Question symptomatic cholelithiasis versus atypical angina type symptoms.  He is a high risk cardiac patient.  We discussed observation for continued troponin trending.  Will consult general surgery to get their opinion on the gallbladder as a source of symptoms. No Murphy's sign on my exam. Spoke with Loetta Ringer with surgery team. They will consult.   Discussed patient's case with [facility] to request admission. Patient and family (if present) updated with plan.   I reviewed all nursing notes, vitals, pertinent old records, EKGs, labs, imaging (as available).    Roberts Ching, MD 03/22/24 [phone]

## 2024-03-22 NOTE — Consult Note (Signed)
 Steven Munoz Aug 20, 1941  [id number].    Requesting MD: Sulema Endo Chief Complaint/Reason for Consult: Cholecystitis  HPI:  83 y/o M w/ a hx of CAD s/p CABG c/b thrombus/STEMI 12/2022 requiring stent who presents with 2 days of epigastric abdominal pain and nausea. He awoke Saturday morning with pain that then radiated to his back. This was similar to his prior MI, however, he had nausea this time that he had not had in the past. When the symptoms persisted, he decided to present for evaluation. Troponin WNL. CT was unremarkable. US  shows cholelithiasis with mild wall thickening. LFTs unremarkable.   He is on Plavix  and last took his dose 5/25 in the morning.   ROS: Review of Systems  Constitutional: Negative.   HENT: Negative.    Eyes: Negative.   Respiratory: Negative.    Cardiovascular: Negative.   Gastrointestinal:  Positive for abdominal pain and nausea.  Genitourinary: Negative.   Musculoskeletal: Negative.   Skin: Negative.   Neurological: Negative.   Endo/Heme/Allergies: Negative.   Psychiatric/Behavioral: Negative.      Family History  Problem Relation Age of Onset   Hypertension Mother    Transient ischemic attack Mother    Lung cancer Mother        liver cancer, smoker, TIA   Liver cancer Mother    Heart attack Father 25   Heart attack Brother 107       smoker   Diabetes Neg Hx    Colon cancer Neg Hx    Prostate cancer Neg Hx     Past Medical History:  Diagnosis Date   Allergy 2004   Tape,   Arthritis    CAD (coronary artery disease) CABG  05/29/09    OPERATIVE PROCEDURES:  Median sternotomy, extracorporeal circulation,    Cataract    Colonic polyp 2002, 2005, 2009   Dr Andriette Keeling   Dermatophytosis of nail    Diverticulosis    Dysmetabolic syndrome    GERD (gastroesophageal reflux disease)    S/P dilation  2005   Gout    Hyperlipidemia    Hypertension    Hypothyroidism    Melanoma (HCC) 08/22/2023   Murmur    Nephrolithiasis    X 1   Peripheral  neuropathy 03/20/2021   Pneumonia age 63   treated as inpatient   Sleep apnea    on CPAP; Dr Villa Greaser    Past Surgical History:  Procedure Laterality Date   athroscopy     right knee X2, left knee X1   CARDIAC CATHETERIZATION     COLONOSCOPY  2002 & 2009 &2014   polypectomy; Dr Andriette Keeling   CORONARY ARTERY BYPASS GRAFT  2010   Median sternotomy, extracorporeal circulation, coronary bypass graft surgery x3 using a left internal mammary artery graft to left anterior descending coronary artery, with a sequential  saphenous vein graft to posterior descending and posterolateral branches  of the right coronary artery.  Endoscopic vein harvesting from the right   EYE SURGERY Right    tear duct   JOINT REPLACEMENT  Right knee 2004, right hip 2015   LEFT HEART CATH AND CORONARY ANGIOGRAPHY N/A 01/26/2023   Procedure: Coronary/Graft Acute MI Revascularization;  Surgeon: Arleen Lacer, MD;  Location: [facility] INVASIVE CV LAB;  Service: Cardiovascular;  Laterality: N/A;   Nail Alvusion Bilateral    Hallux   NOSE SURGERY  11/20/2012   L max antrectomy;septoplasty;turbinate reduction. Dr Thompson Flight   REPLACEMENT TOTAL KNEE     right  2003   REPLACEMENT TOTAL KNEE     partial Left March 2009   TENDON RELEASE Right 03/2016   from R hip. @ [name]   TOTAL HIP ARTHROPLASTY  02/22/2014   right hip; Dr [facility]    Social History:  reports that he quit smoking about 57 years ago. His smoking use included cigarettes. He started smoking about 63 years ago. He has a 6 pack-year smoking history. He has never used smokeless tobacco. He reports current alcohol use of about 1.0 standard drink of alcohol per week. He reports that he does not use drugs.  Allergies:  Allergies  Allergen Reactions   Povidone-Iodine Rash   Ofloxacin  Other (See Comments)    Affected sleep with eye drop formulation   Tape Rash and Other (See Comments)    Skin tears easily Cloth adhesive tape-per patient    (Not in a  hospital admission)   Physical Exam: Blood pressure 132/63, pulse (!) 57, temperature 97.9 F (36.6 C), temperature source Oral, resp. rate 13, height 6' (1.829 m), weight 108.9 kg, SpO2 100%. Gen: male, NAD Abd: soft, non-distended, TTP in the RUQ, no rebound/guarding, no peritoneal signs  Results for orders placed or performed during the hospital encounter of 03/22/24 (from the past 48 hours)  Basic metabolic panel     Status: Abnormal   Collection Time: 03/22/24  1:01 AM  Result Value Ref Range   Sodium 138 135 - 145 mmol/L   Potassium 4.0 3.5 - 5.1 mmol/L   Chloride 103 98 - 111 mmol/L   CO2 26 22 - 32 mmol/L   Glucose, Bld 136 (H) 70 - 99 mg/dL    Comment: Glucose reference range applies only to samples taken after fasting for at least 8 hours.   BUN 15 8 - 23 mg/dL   Creatinine, Ser 1.61 0.61 - 1.24 mg/dL   Calcium  9.4 8.9 - 10.3 mg/dL   GFR, Estimated >09 >60 mL/min    Comment: (NOTE) Calculated using the CKD-EPI Creatinine Equation (2021)    Anion gap 9 5 - 15    Comment: Performed at [facility], [address], [place], [place] [address]  CBC     Status: None   Collection Time: 03/22/24  1:01 AM  Result Value Ref Range   WBC 8.2 4.0 - 10.5 K/uL   RBC 4.89 4.22 - 5.81 MIL/uL   Hemoglobin 15.8 13.0 - 17.0 g/dL   HCT 81.1 91.4 - 78.2 %   MCV 96.7 80.0 - 100.0 fL   MCH 32.3 26.0 - 34.0 pg   MCHC 33.4 30.0 - 36.0 g/dL   RDW 95.6 21.3 - 08.6 %   Platelets 237 150 - 400 K/uL   nRBC 0.0 0.0 - 0.2 %    Comment: Performed at [facility], [address] [place], [place] [address]  Troponin I (High Sensitivity)     Status: None   Collection Time: 03/22/24  1:01 AM  Result Value Ref Range   Troponin I (High Sensitivity) 6 <18 ng/L    Comment: (NOTE) Elevated high sensitivity troponin I (hsTnI) values and significant  changes across serial measurements may suggest ACS but many other  chronic and acute conditions are known to elevate hsTnI results.  Refer  to the "Links" section for chest pain algorithms and additional  guidance. Performed at [facility], [address] [place], [place] [address]   Hepatic function panel     Status: Abnormal  Collection Time: 03/22/24  2:54 AM  Result Value Ref Range   Total Protein 6.1 (L) 6.5 - 8.1 g/dL   Albumin 3.5 3.5 - 5.0 g/dL   AST 33 15 - 41 U/L   ALT 44 0 - 44 U/L   Alkaline Phosphatase 55 38 - 126 U/L   Total Bilirubin 1.0 0.0 - 1.2 mg/dL   Bilirubin, Direct 0.2 0.0 - 0.2 mg/dL   Indirect Bilirubin 0.8 0.3 - 0.9 mg/dL    Comment: Performed at [facility], [address], [place], [place] [address]  Lipase, blood     Status: None   Collection Time: 03/22/24  2:54 AM  Result Value Ref Range   Lipase 37 11 - 51 U/L    Comment: Performed at [facility], [address], [place], [place] [address]  Troponin I (High Sensitivity)     Status: None   Collection Time: 03/22/24  2:54 AM  Result Value Ref Range   Troponin I (High Sensitivity) 6 <18 ng/L    Comment: (NOTE) Elevated high sensitivity troponin I (hsTnI) values and significant  changes across serial measurements may suggest ACS but many other  chronic and acute conditions are known to elevate hsTnI results.  Refer to the "Links" section for chest pain algorithms and additional  guidance. Performed at [facility], [address], [place], [place] [address]    US  Abdomen Limited RUQ (LIVER/GB) Result Date: 03/22/2024 CLINICAL DATA:  Right upper quadrant pain EXAM: ULTRASOUND ABDOMEN LIMITED RIGHT UPPER QUADRANT COMPARISON:  CT of the abdomen and pelvis performed Mar 22, 2024 FINDINGS: Gallbladder: Calcified gallstones with posterior shadowing. The gallbladder wall measures 3.77 mm. No pericholecystic fluid is identified. Sonographic Abigail Abler sign was not reported. Common bile duct: Diameter: 4 mm Liver: Heterogeneous echotexture of the liver. No focal liver lesion. No intrahepatic biliary ductal dilatation.  Portal vein is patent on color Doppler imaging with normal direction of blood flow towards the liver. Other: None. IMPRESSION: 1. Gallstones with mildly thickened gallbladder wall. No pericholecystic fluid. Electronically Signed   By: Reagan Camera M.D.   On: 03/22/2024 08:01   CT ABDOMEN PELVIS W CONTRAST Result Date: 03/22/2024 CLINICAL DATA:  Acute, nonlocalized abdominal pain EXAM: CT ABDOMEN AND PELVIS WITH CONTRAST TECHNIQUE: Multidetector CT imaging of the abdomen and pelvis was performed using the standard protocol following bolus administration of intravenous contrast. RADIATION DOSE REDUCTION: This exam was performed according to the departmental dose-optimization program which includes automated exposure control, adjustment of the mA and/or kV according to patient size and/or use of iterative reconstruction technique. CONTRAST:  75mL OMNIPAQUE  IOHEXOL  350 MG/ML SOLN COMPARISON:  04/10/2009 FINDINGS: Lower chest:  Extensive coronary atherosclerosis. Hepatobiliary: No focal liver abnormality.Numerous gallstones. No evidence of acute cholecystitis Pancreas: Unremarkable. Spleen: Numerous granulomatous type calcification. Adrenals/Urinary Tract: Negative adrenals. No hydronephrosis or stone. Unremarkable bladder. Stomach/Bowel: No obstruction. Lax ileocolic mesentery, cecum in the epigastrium. Multiple distal colonic diverticula. Vascular/Lymphatic: No acute vascular abnormality. Scattered atheromatous calcification, moderately extensive. No mass or adenopathy. Reproductive:No pathologic findings. Other: No ascites or pneumoperitoneum. Musculoskeletal: No acute abnormalities. Advanced lumbar spine degeneration with scoliosis. Right hip replacement. IMPRESSION: No acute finding. Atherosclerosis, cholelithiasis, and colonic diverticulosis. Electronically Signed   By: Ronnette Coke M.D.   On: 03/22/2024 06:13   DG Chest 2 View Result Date: 03/22/2024 CLINICAL DATA:  Chest pain. EXAM: CHEST - 2 VIEW  COMPARISON:  February 06, 2023 FINDINGS: Multiple sternal wires and vascular clips are seen. The  heart size and mediastinal contours are within normal limits. Low lung volumes are noted with mild, stable elevation of the right hemidiaphragm. Mild left basilar atelectasis and/or infiltrate is noted. No pleural effusion or pneumothorax is identified. Multilevel degenerative changes seen throughout the thoracic spine. IMPRESSION: 1. Evidence of prior median sternotomy/CABG. 2. Mild left basilar atelectasis and/or infiltrate. Electronically Signed   By: Virgle Grime M.D.   On: 03/22/2024 01:20    Assessment/Plan 83 y/o M w/ a hx of CAD on Plavix  who presents with 2 days of abdominal pain and nausea  - Exam and imaging c/w cholecystitis.  He took his Plavix  yesterday so no plans for intervention today. - Rocephin  - Hold Plavix  - Repeat CMP in the AM - Will discuss case with oncoming surgeon to determine timing of intervention - Okay for a CLD today. NPO at [place]   [facility] Surgery 03/22/2024, 10:54 AM Please see Amion for pager number during day hours 7:00am-4:30pm or 7:00am -11:30am on weekends

## 2024-03-22 NOTE — H&P (Signed)
 History and Physical    Patient: Steven Munoz:[id number] DOB: September 10, 1941 DOA: 03/22/2024 DOS: the patient was seen and examined on 03/22/2024 PCP: Ezell Hollow, MD  Patient coming from: Home  Chief Complaint:  Chief Complaint  Patient presents with   Chest Pain   HPI: Steven Munoz is a 83 y.o. male with medical history significant of CAD s/p CABG in 2010 w/ recent STEMI in 12/2022 (cath showing thrombotic lesion in distal anastomosis to SVG to PDA EF 45-50%), HTN, HLD, melanoma of R temple, and GERD p/w epigastric pain.  Pt was in his USOH until this past Friday when he noted some chest discomfort that he attributed to indigestion. He took his routine Pepcid , and went on about his day. His pain came and went until yesterday evening, when he noted the pain persisted. He checked his BP 3 times, and each time it increased, which concerned him since this was how his heart attack a year ago presented; as such, he came to the ED for further evaluation.  In the ED, pt was hypertensive and bradycardic on RA. Labs notable for Cr 0.78 and troponin 6-->6. RUQ US  showed gallstones with mildly thickened gallbladder wall. Pt admitted to medicine for ongoing care.  Review of Systems: As mentioned in the history of present illness. All other systems reviewed and are negative. Past Medical History:  Diagnosis Date   Allergy 2004   Tape,   Arthritis    CAD (coronary artery disease) CABG  05/29/09    OPERATIVE PROCEDURES:  Median sternotomy, extracorporeal circulation,    Cataract    Colonic polyp 2002, 2005, 2009   Dr Andriette Keeling   Dermatophytosis of nail    Diverticulosis    Dysmetabolic syndrome    GERD (gastroesophageal reflux disease)    S/P dilation  2005   Gout    Hyperlipidemia    Hypertension    Hypothyroidism    Melanoma (HCC) 08/22/2023   Murmur    Nephrolithiasis    X 1   Peripheral neuropathy 03/20/2021   Pneumonia age 83   treated as inpatient   Sleep apnea    on CPAP; Dr  Villa Greaser   Past Surgical History:  Procedure Laterality Date   athroscopy     right knee X2, left knee X1   CARDIAC CATHETERIZATION     COLONOSCOPY  2002 & 2009 &2014   polypectomy; Dr Andriette Keeling   CORONARY ARTERY BYPASS GRAFT  2010   Median sternotomy, extracorporeal circulation, coronary bypass graft surgery x3 using a left internal mammary artery graft to left anterior descending coronary artery, with a sequential  saphenous vein graft to posterior descending and posterolateral branches  of the right coronary artery.  Endoscopic vein harvesting from the right   EYE SURGERY Right    tear duct   JOINT REPLACEMENT  Right knee 2004, right hip 2015   LEFT HEART CATH AND CORONARY ANGIOGRAPHY N/A 01/26/2023   Procedure: Coronary/Graft Acute MI Revascularization;  Surgeon: Arleen Lacer, MD;  Location: [facility] INVASIVE CV LAB;  Service: Cardiovascular;  Laterality: N/A;   Nail Alvusion Bilateral    Hallux   NOSE SURGERY  11/20/2012   L max antrectomy;septoplasty;turbinate reduction. Dr Thompson Flight   REPLACEMENT TOTAL KNEE     right 2003   REPLACEMENT TOTAL KNEE     partial Left March 2009   TENDON RELEASE Right 03/2016   from R hip. @ [name]   TOTAL HIP ARTHROPLASTY  02/22/2014   right hip;  Dr [facility]   Social History:  reports that he quit smoking about 57 years ago. His smoking use included cigarettes. He started smoking about 63 years ago. He has a 6 pack-year smoking history. He has never used smokeless tobacco. He reports current alcohol use of about 1.0 standard drink of alcohol per week. He reports that he does not use drugs.  Allergies  Allergen Reactions   Povidone-Iodine Rash   Ofloxacin  Other (See Comments)    Affected sleep with eye drop formulation   Tape Rash and Other (See Comments)    Skin tears easily Cloth adhesive tape-per patient    Family History  Problem Relation Age of Onset   Hypertension Mother    Transient ischemic attack Mother    Lung cancer Mother         liver cancer, smoker, TIA   Liver cancer Mother    Heart attack Father 66   Heart attack Brother 65       smoker   Diabetes Neg Hx    Colon cancer Neg Hx    Prostate cancer Neg Hx     Prior to Admission medications   Medication Sig Start Date End Date Taking? Authorizing Provider  allopurinol  (ZYLOPRIM ) 100 MG tablet Take 1 tablet (100 mg total) by mouth daily. 01/07/24  Yes Rice, Haig Levan, MD  aspirin  EC 81 MG tablet Take 81 mg by mouth daily.   Yes [provider]  clopidogrel  (PLAVIX ) 75 MG tablet TAKE 1 TABLET BY MOUTH DAILY WITH BREAKFAST. 11/20/23  Yes Nishan, Peter C, MD  Coenzyme Q10 (COQ10 PO) Take 1 capsule by mouth daily.   Yes [provider]  colestipol  (COLESTID ) 1 g tablet Take 1 g by mouth daily. 06/12/22  Yes [provider]  Cyanocobalamin  (VITAMIN B-12 PO) Take 1 tablet by mouth daily.   Yes [provider]  dapagliflozin  propanediol (FARXIGA ) 10 MG TABS tablet Take 1 tablet (10 mg total) by mouth daily. 02/23/24  Yes Loyde Rule, MD  famotidine  (PEPCID ) 40 MG tablet Take 40 mg by mouth at bedtime.   Yes [provider]  furosemide  (LASIX ) 20 MG tablet Take 1 tablet (20 mg total) by mouth daily as needed. 01/30/23 03/22/24 Yes Sanjuanita Cruz, NP  hydrALAZINE  (APRESOLINE ) 10 MG tablet Take 1 tablet (10 mg total) by mouth 2 (two) times daily as needed. Take one tablet as needed for systolic BP >150 1/61/09 03/22/24 Yes Sanjuanita Cruz, NP  levothyroxine  (SYNTHROID ) 125 MCG tablet Take 1 tablet (125 mcg total) by mouth daily before breakfast. 02/16/24  Yes Paz, Jose E, MD  magnesium  oxide (MAG-OX) 400 (240 Mg) MG tablet Take 400 mg by mouth at bedtime.   Yes [provider]  metoprolol  succinate (TOPROL -XL) 25 MG 24 hr tablet TAKE 1 TABLET (25 MG TOTAL) BY MOUTH DAILY. 02/16/24  Yes Nishan, Peter C, MD  Multiple Vitamins-Minerals (PRESERVISION AREDS 2+MULTI VIT) CAPS Take 1 capsule by mouth 2 (two) times  daily.    Yes [provider]  nitroGLYCERIN  (NITROSTAT ) 0.4 MG SL tablet PLACE 1 TABLET UNDER THE TONGUE EVERY 5 MINUTES AS NEEDED. 03/15/24  Yes Nishan, Peter C, MD  Omega-3 Fatty Acids (FISH OIL PO) Take 1 capsule by mouth daily.   Yes [provider]  rosuvastatin  (CRESTOR ) 40 MG tablet TAKE 1 TABLET BY MOUTH EVERY DAY 07/08/23  Yes Loyde Rule, MD  sacubitril -valsartan  (ENTRESTO ) 49-51 MG Take 1 tablet by mouth 2 (two) times daily. 10/13/23  Yes Loyde Rule, MD  spironolactone  (ALDACTONE ) 25 MG tablet Take 0.5 tablets (12.5 mg total) by mouth daily. 12/01/23  Yes Arleene Belt, [place]    Physical Exam: Vitals:   03/22/24 0845 03/22/24 0900 03/22/24 0915 03/22/24 0930  BP: (!) 124/54 120/61 116/65 133/64  Pulse: (!) 55 (!) 55 (!) 52 (!) 55  Resp: 12 19 10 12   Temp:      TempSrc:      SpO2: 100% 98% 100% 100%  Weight:      Height:       General: Alert, oriented x3, resting comfortably in no acute distress Respiratory: Lungs clear to auscultation bilaterally with normal respiratory effort; no w/r/r Cardiovascular: Regular rate and rhythm w/o m/r/g  Data Reviewed:  Lab Results  Component Value Date   WBC 8.2 03/22/2024   HGB 15.8 03/22/2024   HCT 47.3 03/22/2024   MCV 96.7 03/22/2024   PLT 237 03/22/2024   Lab Results  Component Value Date   GLUCOSE 136 (H) 03/22/2024   CALCIUM  9.4 03/22/2024   NA 138 03/22/2024   K 4.0 03/22/2024   CO2 26 03/22/2024   CL 103 03/22/2024   BUN 15 03/22/2024   CREATININE 0.78 03/22/2024   Lab Results  Component Value Date   ALT 44 03/22/2024   AST 33 03/22/2024   ALKPHOS 55 03/22/2024   BILITOT 1.0 03/22/2024   Lab Results  Component Value Date   INR 1.4 05/29/2009   INR 1.1 05/28/2009   INR 1.0 05/25/2009    Radiology: US  Abdomen Limited RUQ (LIVER/GB) Result Date: 03/22/2024 CLINICAL DATA:  Right upper quadrant pain EXAM: ULTRASOUND ABDOMEN LIMITED RIGHT UPPER QUADRANT COMPARISON:  CT of the  abdomen and pelvis performed Mar 22, 2024 FINDINGS: Gallbladder: Calcified gallstones with posterior shadowing. The gallbladder wall measures 3.77 mm. No pericholecystic fluid is identified. Sonographic Abigail Abler sign was not reported. Common bile duct: Diameter: 4 mm Liver: Heterogeneous echotexture of the liver. No focal liver lesion. No intrahepatic biliary ductal dilatation. Portal vein is patent on color Doppler imaging with normal direction of blood flow towards the liver. Other: None. IMPRESSION: 1. Gallstones with mildly thickened gallbladder wall. No pericholecystic fluid. Electronically Signed   By: Reagan Camera M.D.   On: 03/22/2024 08:01   CT ABDOMEN PELVIS W CONTRAST Result Date: 03/22/2024 CLINICAL DATA:  Acute, nonlocalized abdominal pain EXAM: CT ABDOMEN AND PELVIS WITH CONTRAST TECHNIQUE: Multidetector CT imaging of the abdomen and pelvis was performed using the standard protocol following bolus administration of intravenous contrast. RADIATION DOSE REDUCTION: This exam was performed according to the departmental dose-optimization program which includes automated exposure control, adjustment of the mA and/or kV according to patient size and/or use of iterative reconstruction technique. CONTRAST:  75mL OMNIPAQUE  IOHEXOL  350 MG/ML SOLN COMPARISON:  04/10/2009 FINDINGS: Lower chest:  Extensive coronary atherosclerosis. Hepatobiliary: No focal liver abnormality.Numerous gallstones. No evidence of acute cholecystitis Pancreas: Unremarkable. Spleen: Numerous granulomatous type calcification. Adrenals/Urinary Tract: Negative adrenals. No hydronephrosis or stone. Unremarkable bladder. Stomach/Bowel: No obstruction. Lax ileocolic mesentery, cecum in the epigastrium. Multiple distal colonic diverticula. Vascular/Lymphatic: No acute vascular abnormality. Scattered atheromatous calcification, moderately extensive. No mass or adenopathy. Reproductive:No pathologic findings. Other: No ascites or pneumoperitoneum.  Musculoskeletal: No acute abnormalities. Advanced lumbar spine degeneration with scoliosis. Right hip replacement. IMPRESSION: No acute finding. Atherosclerosis, cholelithiasis, and colonic diverticulosis. Electronically Signed   By: Ronnette Coke M.D.   On: 03/22/2024 06:13   DG Chest 2 View Result Date: 03/22/2024 CLINICAL DATA:  Chest pain. EXAM: CHEST - 2 VIEW COMPARISON:  February 06, 2023 FINDINGS: Multiple sternal wires and vascular clips are seen. The heart size and mediastinal contours are within normal limits. Low lung volumes are noted with mild, stable elevation of the right hemidiaphragm. Mild left basilar atelectasis and/or infiltrate is noted. No pleural effusion or pneumothorax is identified. Multilevel degenerative changes seen throughout the thoracic spine. IMPRESSION: 1. Evidence of prior median sternotomy/CABG. 2. Mild left basilar atelectasis and/or infiltrate. Electronically Signed   By: Virgle Grime M.D.   On: 03/22/2024 01:20    Assessment and Plan: 68M h/o CAD s/p CABG in 2010 w/ recent STEMI in 12/2022 (cath showing thrombotic lesion in distal anastomosis to SVG to PDA EF 45-50%), HTN, HLD, melanoma of R temple, and GERD p/w epigastric pain.  Epigastric pain Symptomatic cholelithiasis -High suspicion for indigestion vs symptomatic cholelithiasis. Will need to exclude UTI given recent urologic procedure for hematuria (no abnl findings per pt).  -EGS consulted per ED; apprec eval/recs -Start PPI (pt previously on this medication; hold pta H2 antag) -F/u urine culture  H/o CAD s/p CABG w/ recent STEMI -PTA ASA, plavix , statin, BB, and entresto    H/o hypothyroid -PTA synthroid  125mcg daily   Advance Care Planning:   Code Status: Full Code   Consults: EGS  Family Communication: Wife, Tanis Fan, at bedside  Severity of Illness: The appropriate patient status for this patient is OBSERVATION. Observation status is judged to be reasonable and necessary in order to provide  the required intensity of service to ensure the patient's safety. The patient's presenting symptoms, physical exam findings, and initial radiographic and laboratory data in the context of their medical condition is felt to place them at decreased risk for further clinical deterioration. Furthermore, it is anticipated that the patient will be medically stable for discharge from the hospital within 2 midnights of admission.   Author: Arne Langdon, MD 03/22/2024 10:10 AM  For on call review [email or web address].

## 2024-03-23 ENCOUNTER — Observation Stay (HOSPITAL_COMMUNITY): Admitting: Anesthesiology

## 2024-03-23 ENCOUNTER — Encounter (HOSPITAL_COMMUNITY)
Admission: EM | Disposition: A | Discharge status: Home / Self Care | Payer: Self-pay | Source: Home / Self Care | Attending: Emergency Medicine

## 2024-03-23 ENCOUNTER — Encounter (HOSPITAL_COMMUNITY): Payer: Self-pay | Admitting: Hospitalist

## 2024-03-23 ENCOUNTER — Other Ambulatory Visit: Payer: Self-pay

## 2024-03-23 DIAGNOSIS — I2510 Atherosclerotic heart disease of native coronary artery without angina pectoris: Secondary | ICD-10-CM | POA: Diagnosis not present

## 2024-03-23 DIAGNOSIS — I10 Essential (primary) hypertension: Secondary | ICD-10-CM

## 2024-03-23 DIAGNOSIS — Z87891 Personal history of nicotine dependence: Secondary | ICD-10-CM

## 2024-03-23 DIAGNOSIS — K810 Acute cholecystitis: Secondary | ICD-10-CM

## 2024-03-23 DIAGNOSIS — R079 Chest pain, unspecified: Secondary | ICD-10-CM | POA: Diagnosis not present

## 2024-03-23 DIAGNOSIS — K8012 Calculus of gallbladder with acute and chronic cholecystitis without obstruction: Secondary | ICD-10-CM | POA: Diagnosis not present

## 2024-03-23 HISTORY — PX: CHOLECYSTECTOMY: SHX55

## 2024-03-23 LAB — SURGICAL PCR SCREEN
MRSA, PCR: NEGATIVE
Staphylococcus aureus: NEGATIVE

## 2024-03-23 SURGERY — LAPAROSCOPIC CHOLECYSTECTOMY
Anesthesia: General | Site: Abdomen

## 2024-03-23 MED ORDER — SUGAMMADEX SODIUM 200 MG/2ML IV SOLN
INTRAVENOUS | Status: DC | PRN
  Administered 2024-03-23: 200 mg via INTRAVENOUS

## 2024-03-23 MED ORDER — CHLORHEXIDINE GLUCONATE 0.12 % MT SOLN
OROMUCOSAL | Status: AC
  Administered 2024-03-23: 15 mL via OROMUCOSAL
  Filled 2024-03-23: qty 15

## 2024-03-23 MED ORDER — LIDOCAINE 2% (20 MG/ML) 5 ML SYRINGE
INTRAMUSCULAR | Status: AC
  Filled 2024-03-23: qty 5

## 2024-03-23 MED ORDER — FENTANYL CITRATE (PF) 250 MCG/5ML IJ SOLN
INTRAMUSCULAR | Status: AC
  Filled 2024-03-23: qty 5

## 2024-03-23 MED ORDER — HYDROMORPHONE HCL 1 MG/ML IJ SOLN
INTRAMUSCULAR | Status: DC | PRN
  Administered 2024-03-23: .5 mg via INTRAVENOUS

## 2024-03-23 MED ORDER — ROCURONIUM BROMIDE 10 MG/ML (PF) SYRINGE
PREFILLED_SYRINGE | INTRAVENOUS | Status: AC
  Filled 2024-03-23: qty 10

## 2024-03-23 MED ORDER — ORAL CARE MOUTH RINSE: 15.0000 mL | Freq: Once | OROMUCOSAL | Status: AC

## 2024-03-23 MED ORDER — ONDANSETRON HCL 4 MG/2ML IJ SOLN
INTRAMUSCULAR | Status: DC | PRN
  Administered 2024-03-23: 4 mg via INTRAVENOUS

## 2024-03-23 MED ORDER — FENTANYL CITRATE (PF) 250 MCG/5ML IJ SOLN
INTRAMUSCULAR | Status: DC | PRN
Start: 2024-03-23 — End: 2024-03-23
  Administered 2024-03-23: 100 ug via INTRAVENOUS
  Administered 2024-03-23: 50 ug via INTRAVENOUS
  Administered 2024-03-23: 100 ug via INTRAVENOUS

## 2024-03-23 MED ORDER — OXYCODONE HCL 5 MG PO TABS: 5.0000 mg | ORAL_TABLET | Freq: Four times a day (QID) | ORAL | Status: DC | PRN

## 2024-03-23 MED ORDER — INDOCYANINE GREEN 25 MG IV SOLR
1.2500 mg | Freq: Once | INTRAVENOUS | Status: AC
Start: 2024-03-23 — End: 2024-03-23
  Administered 2024-03-23: 1.25 mg via INTRAVENOUS
  Filled 2024-03-23: qty 10

## 2024-03-23 MED ORDER — HEMOSTATIC AGENTS (NO CHARGE) OPTIME
TOPICAL | Status: DC | PRN
  Administered 2024-03-23: 1 via TOPICAL

## 2024-03-23 MED ORDER — PROPOFOL 10 MG/ML IV BOLUS
INTRAVENOUS | Status: DC | PRN
  Administered 2024-03-23: 50 mg via INTRAVENOUS
  Administered 2024-03-23: 150 mg via INTRAVENOUS

## 2024-03-23 MED ORDER — 0.9 % SODIUM CHLORIDE (POUR BTL) OPTIME
TOPICAL | Status: DC | PRN
  Administered 2024-03-23: 1000 mL

## 2024-03-23 MED ORDER — LACTATED RINGERS IV SOLN: INTRAVENOUS | Status: AC

## 2024-03-23 MED ORDER — CHLORHEXIDINE GLUCONATE 0.12 % MT SOLN: 15.0000 mL | Freq: Once | OROMUCOSAL | Status: AC

## 2024-03-23 MED ORDER — DIPHENHYDRAMINE HCL 25 MG PO CAPS: 25.0000 mg | ORAL_CAPSULE | Freq: Once | ORAL | Status: DC | PRN

## 2024-03-23 MED ORDER — ROCURONIUM BROMIDE 10 MG/ML (PF) SYRINGE
PREFILLED_SYRINGE | INTRAVENOUS | Status: DC | PRN
  Administered 2024-03-23: 60 mg via INTRAVENOUS
  Administered 2024-03-23 (×2): 20 mg via INTRAVENOUS

## 2024-03-23 MED ORDER — IBUPROFEN 600 MG PO TABS
600.0000 mg | ORAL_TABLET | Freq: Four times a day (QID) | ORAL | Status: DC | PRN
  Administered 2024-03-23 – 2024-03-24 (×3): 600 mg via ORAL
  Filled 2024-03-23 (×3): qty 1

## 2024-03-23 MED ORDER — SODIUM CHLORIDE 0.9 % IR SOLN
Status: DC | PRN
Start: 2024-03-23 — End: 2024-03-23
  Administered 2024-03-23: 1000 mL

## 2024-03-23 MED ORDER — BUPIVACAINE-EPINEPHRINE 0.25% -1:200000 IJ SOLN
INTRAMUSCULAR | Status: DC | PRN
  Administered 2024-03-23: 18 mL

## 2024-03-23 MED ORDER — PROPOFOL 10 MG/ML IV BOLUS
INTRAVENOUS | Status: AC
  Filled 2024-03-23: qty 20

## 2024-03-23 MED ORDER — LIDOCAINE 2% (20 MG/ML) 5 ML SYRINGE
INTRAMUSCULAR | Status: DC | PRN
  Administered 2024-03-23: 60 mg via INTRAVENOUS

## 2024-03-23 MED ORDER — BUPIVACAINE-EPINEPHRINE (PF) 0.25% -1:200000 IJ SOLN
INTRAMUSCULAR | Status: AC
Start: 2024-03-23 — End: ?
  Filled 2024-03-23: qty 30

## 2024-03-23 MED ORDER — HYDROMORPHONE HCL 1 MG/ML IJ SOLN
INTRAMUSCULAR | Status: AC
  Filled 2024-03-23: qty 0.5

## 2024-03-23 SURGICAL SUPPLY — 34 items
BAG COUNTER SPONGE SURGICOUNT (BAG) ×1 IMPLANT
BLADE CLIPPER SURG (BLADE) IMPLANT
CANISTER SUCTION 3000ML PPV (SUCTIONS) ×1 IMPLANT
CHLORAPREP W/TINT 26 (MISCELLANEOUS) ×1 IMPLANT
CLIP APPLIE 5 13 M/L LIGAMAX5 (MISCELLANEOUS) ×1 IMPLANT
CNTNR URN SCR LID CUP LEK RST (MISCELLANEOUS) IMPLANT
COVER SURGICAL LIGHT HANDLE (MISCELLANEOUS) ×1 IMPLANT
DERMABOND ADVANCED .7 DNX12 (GAUZE/BANDAGES/DRESSINGS) ×1 IMPLANT
DISSECTOR BLUNT TIP ENDO 5MM (MISCELLANEOUS) IMPLANT
ELECTRODE REM PT RTRN 9FT ADLT (ELECTROSURGICAL) ×1 IMPLANT
GLOVE BIO SURGEON STRL SZ7.5 (GLOVE) ×1 IMPLANT
GLOVE INDICATOR 8.0 STRL GRN (GLOVE) ×1 IMPLANT
GOWN STRL REUS W/ TWL LRG LVL3 (GOWN DISPOSABLE) ×2 IMPLANT
GOWN STRL REUS W/ TWL XL LVL3 (GOWN DISPOSABLE) ×1 IMPLANT
IRRIGATION SUCT STRKRFLW 2 WTP (MISCELLANEOUS) ×1 IMPLANT
KIT BASIN OR (CUSTOM PROCEDURE TRAY) ×1 IMPLANT
KIT IMAGING PINPOINTPAQ (MISCELLANEOUS) IMPLANT
KIT TURNOVER KIT B (KITS) ×1 IMPLANT
NS IRRIG 1000ML POUR BTL (IV SOLUTION) ×1 IMPLANT
PAD ARMBOARD POSITIONER FOAM (MISCELLANEOUS) ×1 IMPLANT
PENCIL BUTTON HOLSTER BLD 10FT (ELECTRODE) ×1 IMPLANT
POWDER SURGICEL 3.0 GRAM (HEMOSTASIS) IMPLANT
SCISSORS LAP 5X35 DISP (ENDOMECHANICALS) ×1 IMPLANT
SET TUBE SMOKE EVAC HIGH FLOW (TUBING) ×1 IMPLANT
SUT MNCRL AB 4-0 PS2 18 (SUTURE) ×1 IMPLANT
SUT VICRYL 0 UR6 27IN ABS (SUTURE) IMPLANT
SYSTEM BAG RETRIEVAL 10MM (BASKET) IMPLANT
TIP ENDOSCOPIC SURGICEL (TIP) IMPLANT
TOWEL GREEN STERILE FF (TOWEL DISPOSABLE) ×1 IMPLANT
TRAY LAPAROSCOPIC MC (CUSTOM PROCEDURE TRAY) ×1 IMPLANT
TROCAR ADV FIXATION 5X100MM (TROCAR) ×3 IMPLANT
TROCAR BALLN 12MMX100 BLUNT (TROCAR) ×1 IMPLANT
WARMER LAPAROSCOPE (MISCELLANEOUS) ×1 IMPLANT
WATER STERILE IRR 1000ML POUR (IV SOLUTION) ×1 IMPLANT

## 2024-03-23 NOTE — Progress Notes (Signed)
 I received report from day shift pt was nauseous and vomiting, unable to keep anything down PO. I assessed pt with his dinner tray (clear liquid) and he had a couple bites of jello and about 3 ounces of tea. I gave IV Zofran  in hopes he would be able to tolerate that and consume more. Pt started coughing and dry heaving a couple of times but no emesis. Pt expressed concern about being dehydrated and NPO and wanted to know why he wasn't started on fluids. I had messaged the day provider but he deferred me to the night shift which ordered soft IVF and one dose of IV analgesic. It is now 340am and pt has rested comfortably (w home Cpap), reports he is still in pain but feels like the fluids improved nausea and anxiety.   Pt reports he hasn't eaten solid food since Sunday and has not tolerated PO intake due to nausea/vomiting and pain.

## 2024-03-23 NOTE — Progress Notes (Signed)
 PROGRESS NOTE    Steven Munoz  Steven Munoz DOB: [birth year] DOA: 03/22/2024 PCP: Ezell Hollow, MD   Brief Narrative:  Steven Munoz is a 83 y.o. male with medical history significant of CAD s/p CABG in 2010 w/ recent STEMI in 12/2022 (cath showing thrombotic lesion in distal anastomosis to SVG to PDA EF 45-50%), HTN, HLD, melanoma of R temple, and GERD p/w epigastric/chest pain.   In the ED, pt was hypertensive and bradycardic on RA. Labs notable for Cr 0.78 and troponin 6-->6. RUQ US  showed gallstones with mildly thickened gallbladder wall. Pt admitted to medicine for ongoing care.  General surgery consulted.  Assessment & Plan:   Principal Problem:   Chest pain  Acute cholelithiasis and cholecystitis: Seen by general surgery, status post laparoscopic cholecystectomy.  Doing well postoperatively.  Observe overnight, advance diet, if tolerated and no symptoms, potential discharge tomorrow.  H/o CAD s/p CABG w/ recent STEMI -PTA ASA, plavix , statin, BB, and entresto     H/o hypothyroid -PTA synthroid  125mcg daily  DVT prophylaxis: enoxaparin (LOVENOX) injection 40 mg Start: 03/22/24 1015   Code Status: Full Code  Family Communication: Wife present at bedside.  Plan of care discussed with patient in length and he/she verbalized understanding and agreed with it.  Status is: Observation The patient will require care spanning > 2 midnights and should be moved to inpatient because: Needs observation overnight since just had 3 cholecystectomy, potential discharge tomorrow.   Estimated body mass index is 32.19 kg/m as calculated from the following:   Height as of this encounter: 6\' 1"  (1.854 m).   Weight as of this encounter: 110.7 kg.    Nutritional Assessment: Body mass index is 32.19 kg/m.Steven Munoz Seen by dietician.  I agree with the assessment and plan as outlined below: Nutrition Status:        . Skin Assessment: I have examined the patient's skin and I agree with the  wound assessment as performed by the wound care RN as outlined below:    Consultants:  General surgery  Procedures:  As above  Antimicrobials:  Anti-infectives (From admission, onward)    Start     Dose/Rate Route Frequency Ordered Stop   03/22/24 1200  cefTRIAXone  (ROCEPHIN ) 2 g in sodium chloride  0.9 % 100 mL IVPB        2 g 200 mL/hr over 30 Minutes Intravenous Every 24 hours 03/22/24 1102           Subjective: Patient seen and examined after the surgery, wife at the bedside.  Patient is feeling well with no complaints at all.  Objective: Vitals:   03/23/24 1115 03/23/24 1130 03/23/24 1145 03/23/24 1202  BP: (!) 124/55 (!) 109/56 (!) 114/52 (!) 123/55  Pulse: 72 67 60 63  Resp: 18 12 19 18   Temp: 98.8 F (37.1 C)  98.5 F (36.9 C) 98 F (36.7 C)  TempSrc:    Oral  SpO2: 93% 94% 93% 91%  Weight:      Height:        Intake/Output Summary (Last 24 hours) at 03/23/2024 1322 Last data filed at 03/23/2024 1111 Gross per 24 hour  Intake 1050 ml  Output 5 ml  Net 1045 ml   Filed Weights   03/22/24 0047 03/23/24 0750  Weight: 108.9 kg 110.7 kg    Examination:  General exam: Appears calm and comfortable  Respiratory system: Clear to auscultation. Respiratory effort normal. Cardiovascular system: S1 & S2 heard, RRR. No JVD, murmurs, rubs, gallops  or clicks. No pedal edema. Gastrointestinal system: Abdomen is nondistended, soft and nontender. No organomegaly or masses felt. Normal bowel sounds heard. Central nervous system: Alert and oriented. No focal neurological deficits. Extremities: Symmetric 5 x 5 power. Skin: No rashes, lesions or ulcers Psychiatry: Judgement and insight appear normal. Mood & affect appropriate.    Data Reviewed: I have personally reviewed following labs and imaging studies  CBC: Recent Labs  Lab 03/22/24 0101  WBC 8.2  HGB 15.8  HCT 47.3  MCV 96.7  PLT 237   Basic Metabolic Panel: Recent Labs  Lab 03/22/24 0101  NA 138  K  4.0  CL 103  CO2 26  GLUCOSE 136*  BUN 15  CREATININE 0.78  CALCIUM  9.4   GFR: Estimated Creatinine Clearance: 91.2 mL/min (by C-G formula based on SCr of 0.78 mg/dL). Liver Function Tests: Recent Labs  Lab 03/22/24 0254  AST 33  ALT 44  ALKPHOS 55  BILITOT 1.0  PROT 6.1*  ALBUMIN 3.5   Recent Labs  Lab 03/22/24 0254  LIPASE 37   No results for input(s): "AMMONIA" in the last 168 hours. Coagulation Profile: No results for input(s): "INR", "PROTIME" in the last 168 hours. Cardiac Enzymes: No results for input(s): "CKTOTAL", "CKMB", "CKMBINDEX", "TROPONINI" in the last 168 hours. BNP (last 3 results) No results for input(s): "PROBNP" in the last 8760 hours. HbA1C: No results for input(s): "HGBA1C" in the last 72 hours. CBG: No results for input(s): "GLUCAP" in the last 168 hours. Lipid Profile: No results for input(s): "CHOL", "HDL", "LDLCALC", "TRIG", "CHOLHDL", "LDLDIRECT" in the last 72 hours. Thyroid  Function Tests: No results for input(s): "TSH", "T4TOTAL", "FREET4", "T3FREE", "THYROIDAB" in the last 72 hours. Anemia Panel: No results for input(s): "VITAMINB12", "FOLATE", "FERRITIN", "TIBC", "IRON", "RETICCTPCT" in the last 72 hours. Sepsis Labs: No results for input(s): "PROCALCITON", "LATICACIDVEN" in the last 168 hours.  Recent Results (from the past 240 hours)  Surgical pcr screen     Status: None   Collection Time: 03/23/24 12:10 AM   Specimen: Nasal Mucosa; Nasal Swab  Result Value Ref Range Status   MRSA, PCR NEGATIVE NEGATIVE Final   Staphylococcus aureus NEGATIVE NEGATIVE Final    Comment: (NOTE) The Xpert SA Assay (FDA approved for NASAL specimens in patients 87 years of age and older), is one component of a comprehensive surveillance program. It is not intended to diagnose infection nor to guide or monitor treatment. Performed at [facility], [address], [place], [place] [address]      Radiology Studies: US  Abdomen Limited RUQ  (LIVER/GB) Result Date: 03/22/2024 CLINICAL DATA:  Right upper quadrant pain EXAM: ULTRASOUND ABDOMEN LIMITED RIGHT UPPER QUADRANT COMPARISON:  CT of the abdomen and pelvis performed Mar 22, 2024 FINDINGS: Gallbladder: Calcified gallstones with posterior shadowing. The gallbladder wall measures 3.77 mm. No pericholecystic fluid is identified. Sonographic Abigail Abler sign was not reported. Common bile duct: Diameter: 4 mm Liver: Heterogeneous echotexture of the liver. No focal liver lesion. No intrahepatic biliary ductal dilatation. Portal vein is patent on color Doppler imaging with normal direction of blood flow towards the liver. Other: None. IMPRESSION: 1. Gallstones with mildly thickened gallbladder wall. No pericholecystic fluid. Electronically Signed   By: Reagan Camera M.D.   On: 03/22/2024 08:01   CT ABDOMEN PELVIS W CONTRAST Result Date: 03/22/2024 CLINICAL DATA:  Acute, nonlocalized abdominal pain EXAM: CT ABDOMEN AND PELVIS WITH CONTRAST TECHNIQUE: Multidetector CT imaging of the abdomen and pelvis was performed using the standard protocol  following bolus administration of intravenous contrast. RADIATION DOSE REDUCTION: This exam was performed according to the departmental dose-optimization program which includes automated exposure control, adjustment of the mA and/or kV according to patient size and/or use of iterative reconstruction technique. CONTRAST:  75mL OMNIPAQUE  IOHEXOL  350 MG/ML SOLN COMPARISON:  04/10/2009 FINDINGS: Lower chest:  Extensive coronary atherosclerosis. Hepatobiliary: No focal liver abnormality.Numerous gallstones. No evidence of acute cholecystitis Pancreas: Unremarkable. Spleen: Numerous granulomatous type calcification. Adrenals/Urinary Tract: Negative adrenals. No hydronephrosis or stone. Unremarkable bladder. Stomach/Bowel: No obstruction. Lax ileocolic mesentery, cecum in the epigastrium. Multiple distal colonic diverticula. Vascular/Lymphatic: No acute vascular abnormality.  Scattered atheromatous calcification, moderately extensive. No mass or adenopathy. Reproductive:No pathologic findings. Other: No ascites or pneumoperitoneum. Musculoskeletal: No acute abnormalities. Advanced lumbar spine degeneration with scoliosis. Right hip replacement. IMPRESSION: No acute finding. Atherosclerosis, cholelithiasis, and colonic diverticulosis. Electronically Signed   By: Ronnette Coke M.D.   On: 03/22/2024 06:13   DG Chest 2 View Result Date: 03/22/2024 CLINICAL DATA:  Chest pain. EXAM: CHEST - 2 VIEW COMPARISON:  February 06, 2023 FINDINGS: Multiple sternal wires and vascular clips are seen. The heart size and mediastinal contours are within normal limits. Low lung volumes are noted with mild, stable elevation of the right hemidiaphragm. Mild left basilar atelectasis and/or infiltrate is noted. No pleural effusion or pneumothorax is identified. Multilevel degenerative changes seen throughout the thoracic spine. IMPRESSION: 1. Evidence of prior median sternotomy/CABG. 2. Mild left basilar atelectasis and/or infiltrate. Electronically Signed   By: Virgle Grime M.D.   On: 03/22/2024 01:20    Scheduled Meds:  acetaminophen   1,000 mg Oral QID   aspirin  EC  81 mg Oral Daily   calcium  carbonate  1 tablet Oral TID WC   enoxaparin (LOVENOX) injection  40 mg Subcutaneous Q24H   levothyroxine   125 mcg Oral QAC breakfast   metoprolol  succinate  25 mg Oral Daily   pantoprazole  40 mg Oral Daily   rosuvastatin   40 mg Oral Daily   sacubitril -valsartan   1 tablet Oral BID   Continuous Infusions:  sodium chloride  40 mL/hr at 03/23/24 1209   cefTRIAXone  (ROCEPHIN )  IV 2 g (03/23/24 1210)   lactated ringers Stopped (03/23/24 1111)     LOS: 0 days   Modena Andes, MD [facility]  03/23/2024, 1:22 PM   *Please note that this is a verbal dictation therefore any spelling or grammatical errors are due to the "Dragon Medical One" system interpretation.  Please page via Amion and do  not message via secure chat for urgent patient care matters. Secure chat can be used for non urgent patient care matters.  How to contact the [facility] Attending or Consulting provider 7A - 7P or covering provider during after hours 7P -7A, for this patient?  Check the care team in [facility] and look for a) attending/consulting [facility] provider listed and b) the [facility] team listed. Page or secure chat 7A-7P. Log into [email or web address] and use [place]'s universal password to access. If you do not have the password, please contact the hospital operator. Locate the [facility] provider you are looking for under [facility] and page to a number that you can be directly reached. If you still have difficulty reaching the provider, please page the [facility] (Director on Call) for the Hospitalists listed on amion for assistance.

## 2024-03-23 NOTE — Plan of Care (Signed)
  Problem: Clinical Measurements: Goal: Diagnostic test results will improve Outcome: Progressing   Problem: Clinical Measurements: Goal: Will remain free from infection Outcome: Progressing   Problem: Clinical Measurements: Goal: Ability to maintain clinical measurements within normal limits will improve Outcome: Progressing   Problem: Health Behavior/Discharge Planning: Goal: Ability to manage health-related needs will improve Outcome: Progressing   Problem: Education: Goal: Knowledge of General Education information will improve Description: Including pain rating scale, medication(s)/side effects and non-pharmacologic comfort measures Outcome: Progressing   

## 2024-03-23 NOTE — Anesthesia Preprocedure Evaluation (Signed)
 Anesthesia Evaluation  Patient identified by MRN, date of birth, ID band Patient awake    Reviewed: Allergy & Precautions, NPO status , Patient's Chart, lab work & pertinent test results  Airway Mallampati: I  TM Distance: >3 FB Neck ROM: Full    Dental  (+) Dental Advisory Given   Pulmonary sleep apnea and Continuous Positive Airway Pressure Ventilation , former smoker   Pulmonary exam normal        Cardiovascular hypertension, Pt. on medications + CAD, + Past MI, + Cardiac Stents and + CABG  Normal cardiovascular exam     Neuro/Psych  Neuromuscular disease    GI/Hepatic Neg liver ROS,GERD  ,,  Endo/Other  Hypothyroidism    Renal/GU negative Renal ROS     Musculoskeletal  (+) Arthritis ,    Abdominal   Peds  Hematology negative hematology ROS (+)   Anesthesia Other Findings   Reproductive/Obstetrics                             Anesthesia Physical Anesthesia Plan  ASA: 3  Anesthesia Plan: General   Post-op Pain Management: Tylenol  PO (pre-op)*   Induction: Intravenous  PONV Risk Score and Plan: 2 and Dexamethasone , Ondansetron  and Treatment may vary due to age or medical condition  Airway Management Planned: Oral ETT  Additional Equipment:   Intra-op Plan:   Post-operative Plan: Extubation in OR  Informed Consent: I have reviewed the patients History and Physical, chart, labs and discussed the procedure including the risks, benefits and alternatives for the proposed anesthesia with the patient or authorized representative who has indicated his/her understanding and acceptance.     Dental advisory given  Plan Discussed with: CRNA  Anesthesia Plan Comments:        Anesthesia Quick Evaluation

## 2024-03-23 NOTE — Discharge Instructions (Signed)

## 2024-03-23 NOTE — Op Note (Addendum)
 03/22/2024 - 03/23/2024 11:00 AM  PATIENT: Steven Munoz  83 y.o. male  Patient Care Team: Ezell Hollow, MD as PCP - General (Internal Medicine) Loyde Rule, MD as PCP - Cardiology (Cardiology) Hallows, Kirsten Perches, MD as Consulting Physician (Orthopedic Surgery) Lind Repine, MD as Consulting Physician (Pulmonary Disease) Serafin Dames, MD as Consulting Physician (Gastroenterology) Oris Birmingham, MD as Consulting Physician (Ophthalmology) Royann Cords, DC as Referring Physician (Chiropractic Medicine) Cecilie Coffee, RPH-CPP (Pharmacist) Hazle Lites, MD as Consulting Physician (Orthopedic Surgery)  PRE-OPERATIVE DIAGNOSIS: Acute cholecystitis  POST-OPERATIVE DIAGNOSIS: Acute cholecystitis  PROCEDURE: Laparoscopic cholecystectomy with indocyanine green  cholangiography  SURGEON: Beatris Lincoln, MD  ASSISTANT: Jacquelynne Matters RNFA  ANESTHESIA: General endotracheal  EBL: 20 mL  DRAINS: None  SPECIMEN: Gallbladder  COUNTS: Sponge, needle and instrument counts were reported correct x2 at the conclusion of the operation  DISPOSITION: PACU in satisfactory condition  COMPLICATIONS: none  FINDINGS: Acutely inflamed appearing gallbladder.  ICG cholangiography demonstrates uptake by the liver and excretion into the biliary system.  There is opacification of the cystic duct.  Did not see any significant opacification of the gallbladder.  Faint tracer activity is also seen to the wall of the duodenum consistent with a patent biliary system.  Critical view of safety achieved prior to clipping or dividing any structures.  DESCRIPTION:   The patient was identified & brought into the operating room.  He was then positioned supine on the OR table. SCDs were in place and active during the entire case.  He then underwent general endotracheal anesthesia. Pressure points were padded. Hair on the abdomen was clipped by the OR team. The abdomen was prepped and draped in the standard  sterile fashion. Antibiotics were administered. A surgical timeout was performed and confirmed our plan.   A periumbilical incision was made. The umbilical stalk was grasped and retracted outwardly. The supraumbilical fascia was identified and incised. The peritoneal cavity was gently entered bluntly. A purse-string 0 Vicryl suture was placed. The Hasson cannula was inserted into the peritoneal cavity and insufflation with CO2 commenced to . A laparoscope was inserted into the peritoneal cavity and inspection confirmed no evidence of trocar site complications. The patient was then positioned in reverse Trendelenburg with slight left side down. 3 additional 5mm trocars were placed along the right subcostal line - one 5mm port in mid subcostal region, another 5mm port in the right flank near the anterior axillary line, and a third 5mm port in the left subxiphoid region obliquely near the falciform ligament.  The liver and gallbladder were inspected.  The gallbladder was acutely inflamed in appearance.  There are omental adhesions to it.  These were carefully taken down with electrocautery for hemostasis purposes.  The gallbladder is tense and distended.  It is not able to really be grasped so we opted to proceed with laparoscopic decompression of the gallbladder.  A laparoscopic Nezhat was used to aspirate the gallbladder which contained turbid bilious fluid. The gallbladder fundus was then able to be grasped and elevated cephalad. An additional grasper was then placed on the infundibulum of the gallbladder and the infundibulum was retracted laterally. Staying high on the gallbladder, the peritoneum on both sides of the gallbladder was opened with hook cautery. Gentle blunt dissection was then employed with a [place]  dissector working down into [employer]. The cystic duct was identified and carefully circumferentially dissected. The cystic artery was also identified and carefully circumferentially  dissected. The space between the cystic artery and hepatocystic  plate was developed such that a good view of the liver could be seen through a window medial to the cystic artery. The triangle of Calot had been cleared of all fibrofatty tissue. At this point, a critical view of safety was achieved and the only structures visualized was the skeletonized cystic duct laterally, the skeletonized cystic artery and the liver through the window medial to the artery.  A diminutive posterior cystic artery was noted as well.  Under near-infrared light, indocyanine green cholangiography demonstrates uptake by the liver and excretion into the biliary system.  The cystic duct has tracer activity seen within it.  The gallbladder does not appear to fill.  There is faint tracer activity seen to the wall of the duodenum consistent with a patent biliary system.  There is no tracer activity seen within our candidate cystic artery or its diminutive posterior division.  The cystic duct and artery were clipped with 2 clips on the patient side and 1 clip on the specimen side. The cystic duct and artery were then divided.  Additional clips were also placed on the smaller posterior cystic vision similarly.  The gallbladder was then freed from its remaining attachments to the liver using electrocautery and placed into an endocatch bag. The RUQ was gently irrigated with sterile saline. Hemostasis was then verified. The clips were in good position; the gallbladder fossa was dry. Arista hemostatic powder was placed in the gallbladder fossa. The rest of the abdomen was inspected no injury nor bleeding elsewhere was identified.  The endocatch bag containing the gallbladder was then removed from the umbilical port site and passed off as specimen. The RUQ ports were removed under direct visualization and noted to be hemostatic. The umbilical fascia was then closed using the 0 Vicryl purse-string suture. The fascia was palpated and noted to be  completely closed. The skin of all incision sites was approximated with 4-0 monocryl subcuticular suture and dermabond applied. He was then awakened from anesthesia, extubated, and transferred to a stretcher for transport to PACU in satisfactory condition.

## 2024-03-23 NOTE — Progress Notes (Signed)
   03/23/24 1220  TOC Brief Assessment  Insurance and Status Reviewed  Patient has primary care physician Yes  Home environment has been reviewed spouse  Prior level of function: independent  Prior/Current Home Services No current home services  Social Drivers of Health Review SDOH reviewed no interventions necessary  Readmission risk has been reviewed No  Transition of care needs no transition of care needs at this time   Laparoscopic cholecystectomy with indocyanine green cholangiography today     Transition of Care Department (TOC) has reviewed patient and no TOC needs have been identified at this time. We will continue to monitor patient advancement through interdisciplinary progression rounds. If new patient transition needs arise, please place a TOC consult.

## 2024-03-23 NOTE — Progress Notes (Signed)
 Subjective No acute events. Feeling reasonably well, still some MEG pain.   Retired IT sales professional in [employer]  Objective: Vital signs in last 24 hours: Temp:  [97.7 F (36.5 C)-98.6 F (37 C)] 97.9 F (36.6 C) (05/27 0750) Pulse Rate:  [52-75] 55 (05/27 0750) Resp:  [10-19] 18 (05/27 0750) BP: (116-134)/(48-71) 119/53 (05/27 0750) SpO2:  [92 %-100 %] 92 % (05/27 0750) Weight:  [110.7 kg] 110.7 kg (05/27 0750)    Intake/Output from previous day: 05/26 0701 - 05/27 0700 In: 170 [P.O.:170] Out: 200 [Emesis/NG output:200] Intake/Output this shift: No intake/output data recorded.  Gen: NAD, comfortable CV: RRR Pulm: Normal work of breathing Abd: Soft, mildly ttp in RUQ and MEG; nondistended Ext: SCDs in place  Lab Results: CBC  Recent Labs    03/22/24 0101  WBC 8.2  HGB 15.8  HCT 47.3  PLT 237   BMET Recent Labs    03/22/24 0101  NA 138  K 4.0  CL 103  CO2 26  GLUCOSE 136*  BUN 15  CREATININE 0.78  CALCIUM  9.4   PT/INR No results for input(s): "LABPROT", "INR" in the last 72 hours. ABG No results for input(s): "PHART", "HCO3" in the last 72 hours.  Invalid input(s): "PCO2", "PO2"  Studies/Results:  Anti-infectives: Anti-infectives (From admission, onward)    Start     Dose/Rate Route Frequency Ordered Stop   03/22/24 1200  [MAR Hold]  cefTRIAXone  (ROCEPHIN ) 2 g in sodium chloride  0.9 % 100 mL IVPB        (MAR Hold since Tue 03/23/2024 at 0745.Hold Reason: Transfer to a Procedural area)   2 g 200 mL/hr over 30 Minutes Intravenous Every 24 hours 03/22/24 1102          Assessment/Plan: Patient Active Problem List   Diagnosis Date Noted   Chest pain 03/22/2024   Melanoma (HCC) 09/02/2023   Postoperative hematoma involving circulatory system following cardiac catheterization 01/27/2023   HFrEF (heart failure with reduced ejection fraction) (HCC) 01/27/2023   Acute ST elevation myocardial infarction (STEMI) of inferior wall (HCC)  01/26/2023   Generalized osteoarthritis of hand 11/25/2022   Exocrine pancreatic insufficiency 04/17/2022   Idiopathic chronic gout, unspecified site, without tophus (tophi) 10/31/2021   Peripheral neuropathy 03/20/2021   History of otitis externa 12/25/2020   Chronic swimmer's ear of right side 12/11/2020   Bell's palsy 02/09/2020   Gout 10/03/2019   Diarrhea in adult patient 08/04/2019   Diverticulosis 08/04/2019   Trochanteric bursitis of left hip 12/22/2017   Sensorineural hearing loss (SNHL), 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   Annual physical exam 06/06/2016   Psoas tendinitis of right side 04/04/2016   PCP NOTES >>>>>>>>>>>> 02/13/2016   Coronary artery disease involving native coronary artery of native heart with unstable angina pectoris (HCC) 02/13/2016   Pedal edema 01/25/2016   Presence of right artificial hip joint 08/17/2015   Hip flexor tendinitis, right 11/28/2014   Obesity (BMI 30-39.9) 06/02/2014   CTS (carpal tunnel syndrome) 06/01/2014   Varicose veins 04/07/2014   Hip dislocation, right (HCC) 03/11/2014   S/P total hip arthroplasty 03/11/2014   Prolonged P-R interval 03/04/2013   NEPHROLITHIASIS, HX OF 05/18/2010   ATRIAL FIBRILLATION 06/20/2009   CORONARY ARTERY BYPASS GRAFT, HX OF 06/20/2009   PREMATURE VENTRICULAR CONTRACTIONS 05/09/2009   MURMUR 05/08/2009   Essential hypertension 04/10/2009   Diverticulosis of colon 04/10/2009   G E R D 08/05/2008   OSA on CPAP 08/05/2008   DERMATOPHYTOSIS OF NAIL 07/29/2008   Nocturia 05/30/2008  Hypothyroidism 02/15/2008   Hyperlipidemia with target LDL less than 70 02/15/2008   History of gout 02/15/2008   History of colonic polyps 01/14/2008   82 y/o M w/ a hx of CAD on Plavix  here with suspected acute cholecystitis  No prior abdominal surgical history reported  -The anatomy and physiology of the hepatobiliary system was discussed with the patient and his wife. The pathophysiology of gallbladder disease was then reviewed  as well. -The options for treatment were discussed including ongoing observation which may result in subsequent gallbladder complications (infection, pancreatitis, choledocholithiasis, etc), drainage procedures, and surgery - laparoscopic cholecystectomy with indocyanine green cholangiography -The planned procedure, material risks (including, but not limited to, pain, bleeding, infection, scarring, need for blood transfusion, damage to surrounding structures- blood vessels/nerves/viscus/organs, damage to bile duct, bile leak, chronic diarrhea, conversion to a 'subtotal' cholecystectomy and general expectations therein, post-cholecystectomy diarrhea, potential need for additional procedures including EGD/ERCP, hernia, worsening of pre-existing medical conditions, pancreatitis, pneumonia, heart attack, stroke, death) benefits and alternatives to surgery were discussed at length. We have noted a good probability that the procedure would help improve their symptoms. The patient's questions were answered to he and his wife's satisfaction, they voiced understanding and elected to proceed with surgery. Additionally, we discussed typical postoperative expectations and the recovery process.   LOS: 0 days   I spent a total of 50 minutes in both face-to-face and non-face-to-face activities, excluding procedures performed, for this visit on the date of this encounter.   Beatris Lincoln, MD [facility], [facility]

## 2024-03-23 NOTE — Anesthesia Procedure Notes (Signed)
 Procedure Name: Intubation Date/Time: 03/23/2024 9:37 AM  Performed by: Katrinka Parr, CRNAPre-anesthesia Checklist: Patient identified, Emergency Drugs available, Suction available and Patient being monitored Patient Re-evaluated:Patient Re-evaluated prior to induction Oxygen Delivery Method: Circle System Utilized Preoxygenation: Pre-oxygenation with 100% oxygen Induction Type: IV induction Ventilation: Mask ventilation without difficulty Laryngoscope Size: Mac and 4 Grade View: Grade I Tube type: Oral Tube size: 7.5 mm Number of attempts: 1 Airway Equipment and Method: Stylet and Oral airway Placement Confirmation: ETT inserted through vocal cords under direct vision, positive ETCO2 and breath sounds checked- equal and bilateral Secured at: 23 cm Tube secured with: Tape Dental Injury: Teeth and Oropharynx as per pre-operative assessment

## 2024-03-23 NOTE — Transfer of Care (Signed)
 Immediate Anesthesia Transfer of Care Note  Patient: Steven Munoz  Procedure(s) Performed: LAPAROSCOPIC CHOLECYSTECTOMY (Abdomen)  Patient Location: PACU  Anesthesia Type:General  Level of Consciousness: awake, alert , and oriented  Airway & Oxygen Therapy: Patient Spontanous Breathing and Patient connected to nasal cannula oxygen  Post-op Assessment: Report given to RN and Post -op Vital signs reviewed and stable  Post vital signs: Reviewed and stable  Last Vitals:  Vitals Value Taken Time  BP 124/55 03/23/24 1115  Temp 37.1 C 03/23/24 1115  Pulse 72 03/23/24 1120  Resp 15 03/23/24 1120  SpO2 89 % 03/23/24 1120  Vitals shown include unfiled device data.  Last Pain:  Vitals:   03/23/24 1115  TempSrc:   PainSc: 0-No pain         Complications: No notable events documented.

## 2024-03-24 ENCOUNTER — Encounter (HOSPITAL_COMMUNITY): Payer: Self-pay | Admitting: Surgery

## 2024-03-24 ENCOUNTER — Observation Stay (HOSPITAL_COMMUNITY)

## 2024-03-24 DIAGNOSIS — K5730 Diverticulosis of large intestine without perforation or abscess without bleeding: Secondary | ICD-10-CM | POA: Diagnosis not present

## 2024-03-24 DIAGNOSIS — R079 Chest pain, unspecified: Secondary | ICD-10-CM | POA: Diagnosis not present

## 2024-03-24 DIAGNOSIS — M47816 Spondylosis without myelopathy or radiculopathy, lumbar region: Secondary | ICD-10-CM | POA: Diagnosis not present

## 2024-03-24 DIAGNOSIS — K831 Obstruction of bile duct: Secondary | ICD-10-CM | POA: Diagnosis not present

## 2024-03-24 DIAGNOSIS — Z9889 Other specified postprocedural states: Secondary | ICD-10-CM | POA: Diagnosis not present

## 2024-03-24 LAB — COMPREHENSIVE METABOLIC PANEL WITH GFR
ALT: 336 U/L — ABNORMAL HIGH (ref 0–44)
ALT: 312 U/L — ABNORMAL HIGH (ref 0–44)
AST: 382 U/L — ABNORMAL HIGH (ref 15–41)
AST: 315 U/L — ABNORMAL HIGH (ref 15–41)
Albumin: 2.7 g/dL — ABNORMAL LOW (ref 3.5–5.0)
Albumin: 2.8 g/dL — ABNORMAL LOW (ref 3.5–5.0)
Alkaline Phosphatase: 99 U/L (ref 38–126)
Alkaline Phosphatase: 94 U/L (ref 38–126)
Anion gap: 3 — ABNORMAL LOW (ref 5–15)
Anion gap: 4 — ABNORMAL LOW (ref 5–15)
BUN: 15 mg/dL (ref 8–23)
BUN: 14 mg/dL (ref 8–23)
CO2: 27 mmol/L (ref 22–32)
CO2: 26 mmol/L (ref 22–32)
Calcium: 8.4 mg/dL — ABNORMAL LOW (ref 8.9–10.3)
Calcium: 8.4 mg/dL — ABNORMAL LOW (ref 8.9–10.3)
Chloride: 106 mmol/L (ref 98–111)
Chloride: 105 mmol/L (ref 98–111)
Creatinine, Ser: 0.82 mg/dL (ref 0.61–1.24)
Creatinine, Ser: 0.81 mg/dL (ref 0.61–1.24)
GFR, Estimated: >60 mL/min (ref >60)
GFR, Estimated: >60 mL/min (ref >60)
Glucose, Bld: 113 mg/dL — ABNORMAL HIGH (ref 70–99)
Glucose, Bld: 132 mg/dL — ABNORMAL HIGH (ref 70–99)
Potassium: 4.4 mmol/L (ref 3.5–5.1)
Potassium: 3.9 mmol/L (ref 3.5–5.1)
Sodium: 136 mmol/L (ref 135–145)
Sodium: 135 mmol/L (ref 135–145)
Total Bilirubin: 2.2 mg/dL — ABNORMAL HIGH (ref 0.0–1.2)
Total Bilirubin: 2.3 mg/dL — ABNORMAL HIGH (ref 0.0–1.2)
Total Protein: 5.2 g/dL — ABNORMAL LOW (ref 6.5–8.1)
Total Protein: 5.3 g/dL — ABNORMAL LOW (ref 6.5–8.1)

## 2024-03-24 LAB — CBC WITH DIFFERENTIAL/PLATELET
Abs Immature Granulocytes: 0.03 10*3/uL (ref 0.00–0.07)
Basophils Absolute: 0 10*3/uL (ref 0.0–0.1)
Basophils Relative: 0 %
Eosinophils Absolute: 0.1 10*3/uL (ref 0.0–0.5)
Eosinophils Relative: 2 %
HCT: 40.7 % (ref 39.0–52.0)
Hemoglobin: 13.8 g/dL (ref 13.0–17.0)
Immature Granulocytes: 0 %
Lymphocytes Relative: 15 %
Lymphs Abs: 1.1 10*3/uL (ref 0.7–4.0)
MCH: 32.2 pg (ref 26.0–34.0)
MCHC: 33.9 g/dL (ref 30.0–36.0)
MCV: 95.1 fL (ref 80.0–100.0)
Monocytes Absolute: 0.7 10*3/uL (ref 0.1–1.0)
Monocytes Relative: 10 %
Neutro Abs: 5.4 10*3/uL (ref 1.7–7.7)
Neutrophils Relative %: 73 %
Platelets: 165 10*3/uL (ref 150–400)
RBC: 4.28 MIL/uL (ref 4.22–5.81)
RDW: 13.6 % (ref 11.5–15.5)
WBC: 7.4 10*3/uL (ref 4.0–10.5)
nRBC: 0 % (ref 0.0–0.2)

## 2024-03-24 LAB — SURGICAL PATHOLOGY

## 2024-03-24 MED ORDER — GADOBUTROL 1 MMOL/ML IV SOLN
10.0000 mL | Freq: Once | INTRAVENOUS | Status: AC | PRN
  Administered 2024-03-24: 10 mL via INTRAVENOUS

## 2024-03-24 NOTE — Plan of Care (Signed)
  Problem: Activity: Goal: Risk for activity intolerance will decrease Outcome: Progressing   Problem: Nutrition: Goal: Adequate nutrition will be maintained Outcome: Progressing   Problem: Elimination: Goal: Will not experience complications related to bowel motility Outcome: Progressing Goal: Will not experience complications related to urinary retention Outcome: Progressing   Problem: Pain Managment: Goal: General experience of comfort will improve and/or be controlled Outcome: Progressing   Problem: Safety: Goal: Ability to remain free from injury will improve Outcome: Progressing

## 2024-03-24 NOTE — Anesthesia Postprocedure Evaluation (Signed)
 Anesthesia Post Note  Patient: Steven Munoz  Procedure(s) Performed: LAPAROSCOPIC CHOLECYSTECTOMY (Abdomen)     Patient location during evaluation: PACU Anesthesia Type: General Level of consciousness: awake and alert Pain management: pain level controlled Vital Signs Assessment: post-procedure vital signs reviewed and stable Respiratory status: spontaneous breathing, nonlabored ventilation, respiratory function stable and patient connected to nasal cannula oxygen Cardiovascular status: blood pressure returned to baseline and stable Postop Assessment: no apparent nausea or vomiting Anesthetic complications: no   There were no known notable events for this encounter.  Last Vitals:  Vitals:   03/24/24 0816 03/24/24 0907  BP: (!) 117/48 (!) 117/48  Pulse: (!) 57 (!) 57  Resp: 17   Temp: 36.8 C   SpO2: 95%     Last Pain:  Vitals:   03/24/24 0912  TempSrc:   PainSc: 3                  Melvenia Stabs

## 2024-03-24 NOTE — Progress Notes (Signed)
 PROGRESS NOTE    Steven Munoz  UJW:[id number] DOB: 07-Aug-1941 DOA: 03/22/2024 PCP: Ezell Hollow, MD   Brief Narrative:  Steven Munoz is a 83 y.o. male with medical history significant of CAD s/p CABG in 2010 w/ recent STEMI in 12/2022 (cath showing thrombotic lesion in distal anastomosis to SVG to PDA EF 45-50%), HTN, HLD, melanoma of R temple, and GERD p/w epigastric/chest pain.   In the ED, pt was hypertensive and bradycardic on RA. Labs notable for Cr 0.78 and troponin 6-->6. RUQ US  showed gallstones with mildly thickened gallbladder wall. Pt admitted to medicine for ongoing care.  General surgery consulted.  Assessment & Plan:   Principal Problem:   Chest pain  Acute cholelithiasis and cholecystitis: Seen by general surgery, status post laparoscopic cholecystectomy.  Doing well postoperatively and has no symptoms.  Hyperbilirubinemia/elevated LFTs: Patient's LFTs and bilirubin suddenly jumped today all the way normal AST ALT 2 AST 382 and ALT 332 and bilirubin 2.3.  Suspected air so we repeated CMP but these are real values.  General surgery recommends keeping overnight and repeating LFTs in the morning.  Suspected cystic duct stone.  May need GI consult however before consulting them, I will obtain MRCP.  H/o CAD s/p CABG w/ recent STEMI -PTA ASA, plavix , statin, BB, and entresto     H/o hypothyroid -PTA synthroid  125mcg daily  DVT prophylaxis: enoxaparin  (LOVENOX ) injection 40 mg Start: 03/22/24 1015   Code Status: Full Code  Family Communication: Wife present at bedside.  Plan of care discussed with patient in length and he/she verbalized understanding and agreed with it.  Status is: Observation The patient will require care spanning > 2 midnights and should be moved to inpatient because: Needs observation overnight since just had 3 cholecystectomy, potential discharge tomorrow.   Estimated body mass index is 32.19 kg/m as calculated from the following:   Height as of  this encounter: 6\' 1"  (1.854 m).   Weight as of this encounter: 110.7 kg.    Nutritional Assessment: Body mass index is 32.19 kg/m.[name] Seen by dietician.  I agree with the assessment and plan as outlined below: Nutrition Status:        . Skin Assessment: I have examined the patient's skin and I agree with the wound assessment as performed by the wound care RN as outlined below:    Consultants:  General surgery  Procedures:  As above  Antimicrobials:  Anti-infectives (From admission, onward)    Start     Dose/Rate Route Frequency Ordered Stop   03/22/24 1200  cefTRIAXone  (ROCEPHIN ) 2 g in sodium chloride  0.9 % 100 mL IVPB        2 g 200 mL/hr over 30 Minutes Intravenous Every 24 hours 03/22/24 1102           Subjective: Patient seen and examined.  Doing well.  No complaints.  Tolerating diet.  Objective: Vitals:   03/24/24 0008 03/24/24 0412 03/24/24 0816 03/24/24 0907  BP: (!) 116/58 (!) 115/57 (!) 117/48 (!) 117/48  Pulse: (!) 52 (!) 51 (!) 57 (!) 57  Resp: 17 16 17    Temp: 97.8 F (36.6 C) 97.7 F (36.5 C) 98.2 F (36.8 C)   TempSrc: Oral Oral Oral   SpO2: 94% 95% 95%   Weight:      Height:        Intake/Output Summary (Last 24 hours) at 03/24/2024 1010 Last data filed at 03/23/2024 1850 Gross per 24 hour  Intake 2202.68 ml  Output  5 ml  Net 2197.68 ml   Filed Weights   03/22/24 0047 03/23/24 0750  Weight: 108.9 kg 110.7 kg    Examination:  General exam: Appears calm and comfortable  Respiratory system: Clear to auscultation. Respiratory effort normal. Cardiovascular system: S1 & S2 heard, RRR. No JVD, murmurs, rubs, gallops or clicks. No pedal edema. Gastrointestinal system: Abdomen is nondistended, soft and nontender. No organomegaly or masses felt. Normal bowel sounds heard. Central nervous system: Alert and oriented. No focal neurological deficits. Extremities: Symmetric 5 x 5 power. Skin: No rashes, lesions or ulcers.  Psychiatry:  Judgement and insight appear normal. Mood & affect appropriate.   Data Reviewed: I have personally reviewed following labs and imaging studies  CBC: Recent Labs  Lab 03/22/24 0101 03/24/24 0538  WBC 8.2 7.4  NEUTROABS  --  5.4  HGB 15.8 13.8  HCT 47.3 40.7  MCV 96.7 95.1  PLT 237 165   Basic Metabolic Panel: Recent Labs  Lab 03/22/24 0101 03/24/24 0538 03/24/24 0824  NA 138 136 135  K 4.0 4.4 3.9  CL 103 106 105  CO2 26 27 26   GLUCOSE 136* 113* 132*  BUN 15 15 14   CREATININE 0.78 0.82 0.81  CALCIUM  9.4 8.4* 8.4*   GFR: Estimated Creatinine Clearance: 90.1 mL/min (by C-G formula based on SCr of 0.81 mg/dL). Liver Function Tests: Recent Labs  Lab 03/22/24 0254 03/24/24 0538 03/24/24 0824  AST 33 382* 315*  ALT 44 336* 312*  ALKPHOS 55 99 94  BILITOT 1.0 2.2* 2.3*  PROT 6.1* 5.2* 5.3*  ALBUMIN 3.5 2.7* 2.8*   Recent Labs  Lab 03/22/24 0254  LIPASE 37   No results for input(s): "AMMONIA" in the last 168 hours. Coagulation Profile: No results for input(s): "INR", "PROTIME" in the last 168 hours. Cardiac Enzymes: No results for input(s): "CKTOTAL", "CKMB", "CKMBINDEX", "TROPONINI" in the last 168 hours. BNP (last 3 results) No results for input(s): "PROBNP" in the last 8760 hours. HbA1C: No results for input(s): "HGBA1C" in the last 72 hours. CBG: No results for input(s): "GLUCAP" in the last 168 hours. Lipid Profile: No results for input(s): "CHOL", "HDL", "LDLCALC", "TRIG", "CHOLHDL", "LDLDIRECT" in the last 72 hours. Thyroid  Function Tests: No results for input(s): "TSH", "T4TOTAL", "FREET4", "T3FREE", "THYROIDAB" in the last 72 hours. Anemia Panel: No results for input(s): "VITAMINB12", "FOLATE", "FERRITIN", "TIBC", "IRON", "RETICCTPCT" in the last 72 hours. Sepsis Labs: No results for input(s): "PROCALCITON", "LATICACIDVEN" in the last 168 hours.  Recent Results (from the past 240 hours)  Surgical pcr screen     Status: None   Collection Time:  03/23/24 12:10 AM   Specimen: Nasal Mucosa; Nasal Swab  Result Value Ref Range Status   MRSA, PCR NEGATIVE NEGATIVE Final   Staphylococcus aureus NEGATIVE NEGATIVE Final    Comment: (NOTE) The Xpert SA Assay (FDA approved for NASAL specimens in patients 9 years of age and older), is one component of a comprehensive surveillance program. It is not intended to diagnose infection nor to guide or monitor treatment. Performed at [facility], [address], [place], [place] [address]      Radiology Studies: No results found.   Scheduled Meds:  acetaminophen   1,000 mg Oral QID   aspirin  EC  81 mg Oral Daily   calcium  carbonate  1 tablet Oral TID WC   enoxaparin (LOVENOX) injection  40 mg Subcutaneous Q24H   levothyroxine   125 mcg Oral QAC breakfast   metoprolol  succinate  25 mg  Oral Daily   pantoprazole  40 mg Oral Daily   rosuvastatin   40 mg Oral Daily   sacubitril -valsartan   1 tablet Oral BID   Continuous Infusions:  cefTRIAXone  (ROCEPHIN )  IV Stopped (03/23/24 1240)     LOS: 0 days   Modena Andes, MD [facility]  03/24/2024, 10:10 AM   *Please note that this is a verbal dictation therefore any spelling or grammatical errors are due to the "Dragon Medical One" system interpretation.  Please page via Amion and do not message via secure chat for urgent patient care matters. Secure chat can be used for non urgent patient care matters.  How to contact the [facility] Attending or Consulting provider 7A - 7P or covering provider during after hours 7P -7A, for this patient?  Check the care team in [facility] and look for a) attending/consulting [facility] provider listed and b) the [facility] team listed. Page or secure chat 7A-7P. Log into [email or web address] and use [place]'s universal password to access. If you do not have the password, please contact the hospital operator. Locate the [facility] provider you are looking for under [facility] and page to a number that you can be directly  reached. If you still have difficulty reaching the provider, please page the [facility] (Director on Call) for the Hospitalists listed on amion for assistance.

## 2024-03-24 NOTE — Progress Notes (Addendum)
    1 Day Post-Op  Subjective: Feels well today after surgery.   Minimal pain.  Tolerating solid diet.  Some gas pains.  Objective: Vital signs in last 24 hours: Temp:  [97.7 F (36.5 C)-98.8 F (37.1 C)] 98.2 F (36.8 C) (05/28 0816) Pulse Rate:  [51-72] 57 (05/28 0907) Resp:  [12-19] 17 (05/28 0816) BP: (103-124)/(48-59) 117/48 (05/28 0907) SpO2:  [91 %-95 %] 95 % (05/28 0816)    Intake/Output from previous day: 05/27 0701 - 05/28 0700 In: 2202.7 [P.O.:354; I.V.:1648.7; IV Piggyback:200] Out: 5 [Blood:5] Intake/Output this shift: No intake/output data recorded.  PE: Gen: NAD Abd: soft, appropriately tender, +BS, ND, incisions c/d/i  Lab Results:  Recent Labs    03/22/24 0101 03/24/24 0538  WBC 8.2 7.4  HGB 15.8 13.8  HCT 47.3 40.7  PLT 237 165   BMET Recent Labs    03/24/24 0538 03/24/24 0824  NA 136 135  K 4.4 3.9  CL 106 105  CO2 27 26  GLUCOSE 113* 132*  BUN 15 14  CREATININE 0.82 0.81  CALCIUM  8.4* 8.4*   PT/INR No results for input(s): "LABPROT", "INR" in the last 72 hours. CMP     Component Value Date/Time   NA 135 03/24/2024 0824   NA 139 09/06/2022 1057   K 3.9 03/24/2024 0824   CL 105 03/24/2024 0824   CO2 26 03/24/2024 0824   GLUCOSE 132 (H) 03/24/2024 0824   BUN 14 03/24/2024 0824   BUN 17 09/06/2022 1057   CREATININE 0.81 03/24/2024 0824   CREATININE 1.14 11/01/2022 1159   CALCIUM  8.4 (L) 03/24/2024 0824   PROT 5.3 (L) 03/24/2024 0824   ALBUMIN 2.8 (L) 03/24/2024 0824   AST 315 (H) 03/24/2024 0824   ALT 312 (H) 03/24/2024 0824   ALKPHOS 94 03/24/2024 0824   BILITOT 2.3 (H) 03/24/2024 0824   GFRNONAA >60 03/24/2024 0824   GFRAA  06/01/2009 0445    >60        The eGFR has been calculated using the MDRD equation. This calculation has not been validated in all clinical situations. eGFR's persistently <60 mL/min signify possible Chronic Kidney Disease.   Lipase     Component Value Date/Time   LIPASE 37 03/22/2024 0254        Studies/Results: No results found.  Anti-infectives: Anti-infectives (From admission, onward)    Start     Dose/Rate Route Frequency Ordered Stop   03/22/24 1200  cefTRIAXone  (ROCEPHIN ) 2 g in sodium chloride  0.9 % 100 mL IVPB        2 g 200 mL/hr over 30 Minutes Intravenous Every 24 hours 03/22/24 1102          Assessment/Plan POD 1, s/p lap chole, Dr. Camilo Cella 03/23/24 -LFTs bumped today with TB now 2.2 from normal and AST/ALT  382/336 -d/w primary team.  He is going to order MRCP -may need GI involvement -recheck LFTs in am -otherwise patient looks good today and is feeling well. -ambulate and pulm toilet -cont to follow  FEN - regular diet VTE - continue to hold plavix , Lovenox  ID - none currently    LOS: 0 days    Leone Ralphs , [facility] 03/24/2024, 9:24 AM Please see Amion for pager number during day hours 7:00am-4:30pm or 7:00am -11:30am on weekends

## 2024-03-24 NOTE — Plan of Care (Signed)

## 2024-03-24 NOTE — Progress Notes (Signed)
 Radiology called to inform pt needs to be NPO for 4 hours prior to MRI of abdomen. Pt is aware and he says he will not eat or drink anything until the MRI is complete. Pt's spouse it at bedside.

## 2024-03-25 DIAGNOSIS — K8020 Calculus of gallbladder without cholecystitis without obstruction: Secondary | ICD-10-CM | POA: Diagnosis present

## 2024-03-25 DIAGNOSIS — K810 Acute cholecystitis: Secondary | ICD-10-CM | POA: Diagnosis present

## 2024-03-25 DIAGNOSIS — R079 Chest pain, unspecified: Secondary | ICD-10-CM | POA: Diagnosis not present

## 2024-03-25 LAB — CBC WITH DIFFERENTIAL/PLATELET
Abs Immature Granulocytes: 0.02 10*3/uL (ref 0.00–0.07)
Basophils Absolute: 0.1 10*3/uL (ref 0.0–0.1)
Basophils Relative: 1 %
Eosinophils Absolute: 0.2 10*3/uL (ref 0.0–0.5)
Eosinophils Relative: 2 %
HCT: 41.8 % (ref 39.0–52.0)
Hemoglobin: 14.2 g/dL (ref 13.0–17.0)
Immature Granulocytes: 0 %
Lymphocytes Relative: 16 %
Lymphs Abs: 1.1 10*3/uL (ref 0.7–4.0)
MCH: 32.3 pg (ref 26.0–34.0)
MCHC: 34 g/dL (ref 30.0–36.0)
MCV: 95 fL (ref 80.0–100.0)
Monocytes Absolute: 0.6 10*3/uL (ref 0.1–1.0)
Monocytes Relative: 9 %
Neutro Abs: 4.6 10*3/uL (ref 1.7–7.7)
Neutrophils Relative %: 72 %
Platelets: 170 10*3/uL (ref 150–400)
RBC: 4.4 MIL/uL (ref 4.22–5.81)
RDW: 13.6 % (ref 11.5–15.5)
WBC: 6.5 10*3/uL (ref 4.0–10.5)
nRBC: 0 % (ref 0.0–0.2)

## 2024-03-25 LAB — COMPREHENSIVE METABOLIC PANEL WITH GFR
ALT: 311 U/L — ABNORMAL HIGH (ref 0–44)
AST: 223 U/L — ABNORMAL HIGH (ref 15–41)
Albumin: 3 g/dL — ABNORMAL LOW (ref 3.5–5.0)
Alkaline Phosphatase: 124 U/L (ref 38–126)
Anion gap: 6 (ref 5–15)
BUN: 11 mg/dL (ref 8–23)
CO2: 27 mmol/L (ref 22–32)
Calcium: 8.8 mg/dL — ABNORMAL LOW (ref 8.9–10.3)
Chloride: 103 mmol/L (ref 98–111)
Creatinine, Ser: 0.71 mg/dL (ref 0.61–1.24)
GFR, Estimated: >60 mL/min (ref >60)
Glucose, Bld: 103 mg/dL — ABNORMAL HIGH (ref 70–99)
Potassium: 4.1 mmol/L (ref 3.5–5.1)
Sodium: 136 mmol/L (ref 135–145)
Total Bilirubin: 1.1 mg/dL (ref 0.0–1.2)
Total Protein: 5.6 g/dL — ABNORMAL LOW (ref 6.5–8.1)

## 2024-03-25 MED ORDER — ACETAMINOPHEN 500 MG PO TABS: 1000.0000 mg | ORAL_TABLET | Freq: Four times a day (QID) | ORAL | Status: AC | PRN

## 2024-03-25 MED ORDER — METOPROLOL SUCCINATE ER 25 MG PO TB24
12.5000 mg | ORAL_TABLET | Freq: Every day | ORAL | Status: DC
  Filled 2024-03-25: qty 1

## 2024-03-25 NOTE — Progress Notes (Incomplete)
 Azucena Bollard Brake to be D/C'd  per MD order.  Discussed with the patient and all questions fully answered.  VSS, Skin clean, dry and intact without evidence of skin break down, no evidence of skin tears noted.  IV catheter discontinued intact. Site without signs and symptoms of complications. Dressing and pressure applied.  An After Visit Summary was printed and given to the patient. Patient received prescription.  D/c education completed with patient/family including follow up instructions, medication list, d/c activities limitations if indicated, with other d/c instructions as indicated by MD - patient able to verbalize understanding, all questions fully answered.   Patient instructed to return to ED, call 911, or call MD for any changes in condition.   Patient to be escorted via WC, and D/C home via private auto.

## 2024-03-25 NOTE — Progress Notes (Signed)
 Printed and reviewed AVS with patient and spouse. Answered all questions. Patient verbalized understanding of AVS and when to follow-up with his doctor. Patient verbalized that he was taking all medications on his AVS. Educated on discharge instructions and new medication and where to pick that up.

## 2024-03-25 NOTE — Discharge Summary (Signed)
 Physician Discharge Summary  Steven Munoz UJW:[id number] DOB: Sep 30, 1941 DOA: 03/22/2024  PCP: Ezell Hollow, MD  Admit date: 03/22/2024 Discharge date: 03/25/2024    Admitted From: Home Disposition: Home  Recommendations for Outpatient Follow-up:  Follow up with PCP in 1-2 weeks Please obtain CMP/CBC in one week Follow-up with general surgery in 2 weeks Please follow up with your PCP on the following pending results: Unresulted Labs (From admission, onward)     Start     Ordered   03/29/24 0500  Creatinine, serum  (enoxaparin (LOVENOX)    CrCl >/= 30 ml/min)  Weekly,   R     Comments: while on enoxaparin therapy    03/22/24 1007   03/22/24 1012  Urine Culture (for pregnant, neutropenic or urologic patients or patients with an indwelling urinary catheter)  (Urine Labs)  Once,   R       Question:  Indication  Answer:  Suprapubic pain   03/22/24 1012              Home Health: None Equipment/Devices: None  Discharge Condition: Stable  CODE STATUS: Full code Diet recommendation: Cardiac  Subjective: Seen and examined, wife at the bedside.  Patient is feeling well with no complaints at all.  Tolerating diet.  Brief/Interim Summary: Steven Munoz is a 83 y.o. male with medical history significant of CAD s/p CABG in 2010 w/ recent STEMI in 12/2022 (cath showing thrombotic lesion in distal anastomosis to SVG to PDA EF 45-50%), HTN, HLD, melanoma of R temple, and GERD p/w epigastric/chest pain.   In the ED, pt was hypertensive and bradycardic on RA. Labs notable for Cr 0.78 and troponin 6-->6. RUQ US  showed gallstones with mildly thickened gallbladder wall. Pt admitted to medicine for ongoing care.  General surgery consulted.   Acute cholelithiasis and cholecystitis: Seen by general surgery, status post laparoscopic cholecystectomy 03/23/2024.  Doing well postoperatively and has no symptoms.  Tolerating diet.  Cleared by general surgery for discharge and resumption of the Plavix   today.   Hyperbilirubinemia/elevated LFTs: Patient's LFTs and bilirubin suddenly jumped postcholecystectomy all the way normal AST ALT 2 AST 382 and ALT 332 and bilirubin 2.3.  General surgery suspected possible CBD stone obstruction and recommended observing overnight.  We obtained MRCP which showed CBD dilated but no obstruction.  Patient's LFTs improved and bilirubin normalized today.  Seen by general surgery and since patient is doing well, they cleared him for discharge.  They will follow-up with him as outpatient.  PCP to repeat CMP within 1 week.    H/o CAD s/p CABG w/ recent STEMI -PTA ASA, plavix , statin, BB, and entresto     H/o hypothyroid -PTA synthroid  125mcg daily  Discharge plan was discussed with patient and/or family member and they verbalized understanding and agreed with it.  Discharge Diagnoses:  Principal Problem:   Chest pain Active Problems:   Hypothyroidism   Hyperlipidemia with target LDL less than 70   Essential hypertension   OSA on CPAP   CORONARY ARTERY BYPASS GRAFT, HX OF   Obesity (BMI 30-39.9)   Acute cholecystitis   Cholelithiasis    Discharge Instructions   Allergies as of 03/25/2024       Reactions   Povidone-iodine Rash   Ofloxacin  Other (See Comments)   Affected sleep with eye drop formulation   Tape Rash, Other (See Comments)   Skin tears easily Cloth adhesive tape-per patient        Medication List  TAKE these medications    acetaminophen  500 MG tablet Commonly known as: TYLENOL  Take 2 tablets (1,000 mg total) by mouth every 6 (six) hours as needed.   allopurinol  100 MG tablet Commonly known as: ZYLOPRIM  Take 1 tablet (100 mg total) by mouth daily.   aspirin  EC 81 MG tablet Take 81 mg by mouth daily.   clopidogrel  75 MG tablet Commonly known as: PLAVIX  TAKE 1 TABLET BY MOUTH DAILY WITH BREAKFAST.   colestipol  1 g tablet Commonly known as: COLESTID  Take 1 g by mouth daily.   COQ10 PO Take 1 capsule by mouth  daily.   dapagliflozin  propanediol 10 MG Tabs tablet Commonly known as: Farxiga  Take 1 tablet (10 mg total) by mouth daily.   Entresto  49-51 MG Generic drug: sacubitril -valsartan  Take 1 tablet by mouth 2 (two) times daily.   famotidine  40 MG tablet Commonly known as: PEPCID  Take 40 mg by mouth at bedtime.   FISH OIL PO Take 1 capsule by mouth daily.   furosemide  20 MG tablet Commonly known as: Lasix  Take 1 tablet (20 mg total) by mouth daily as needed.   hydrALAZINE  10 MG tablet Commonly known as: APRESOLINE  Take 1 tablet (10 mg total) by mouth 2 (two) times daily as needed. Take one tablet as needed for systolic BP >150   levothyroxine  125 MCG tablet Commonly known as: SYNTHROID  Take 1 tablet (125 mcg total) by mouth daily before breakfast.   magnesium  oxide 400 (240 Mg) MG tablet Commonly known as: MAG-OX Take 400 mg by mouth at bedtime.   metoprolol  succinate 25 MG 24 hr tablet Commonly known as: TOPROL -XL TAKE 1 TABLET (25 MG TOTAL) BY MOUTH DAILY.   nitroGLYCERIN  0.4 MG SL tablet Commonly known as: NITROSTAT  PLACE 1 TABLET UNDER THE TONGUE EVERY 5 MINUTES AS NEEDED.   PreserVision AREDS 2+Multi Vit Caps Take 1 capsule by mouth 2 (two) times daily.   rosuvastatin  40 MG tablet Commonly known as: CRESTOR  TAKE 1 TABLET BY MOUTH EVERY DAY   spironolactone  25 MG tablet Commonly known as: ALDACTONE  Take 0.5 tablets (12.5 mg total) by mouth daily.   VITAMIN B-12 PO Take 1 tablet by mouth daily.        Follow-up Information     Maczis, Puja Gosai, PA-C Follow up on 04/20/2024.   Specialty: General Surgery Why: 10:30am, Arrive 30 minutes prior to your appointment time, Please bring your insurance card and photo ID Contact information: [address] [place] [address] [facility] [place] [address] [phone]         Ezell Hollow, MD Follow up in 1 week(s).   Specialty: Internal Medicine Contact information: [address] [facility] [address] [place] [address] [phone]                Allergies  Allergen Reactions   Povidone-Iodine Rash   Ofloxacin  Other (See Comments)    Affected sleep with eye drop formulation   Tape Rash and Other (See Comments)    Skin tears easily Cloth adhesive tape-per patient    Consultations: General surgery   Procedures/Studies: MR ABDOMEN MRCP W WO CONTAST Result Date: 03/24/2024 CLINICAL DATA:  Postop day 1 status post cholecystectomy, concern for biliary obstruction. EXAM: MRI ABDOMEN WITHOUT AND WITH CONTRAST (INCLUDING MRCP) TECHNIQUE: Multiplanar multisequence MR imaging of the abdomen was performed both before and after the administration of intravenous contrast. Heavily T2-weighted images of the biliary and pancreatic ducts were obtained, and three-dimensional MRCP images were rendered  by post processing. CONTRAST:  10mL GADAVIST GADOBUTROL 1 MMOL/ML IV SOLN COMPARISON:  CT abdomen 03/22/2024 FINDINGS: Despite efforts by the technologist and patient, motion artifact is present on today's exam and could not be eliminated. This reduces exam sensitivity and specificity. This is a common result when MRCP is attempted in the inpatient setting where patients are less likely to be able to breath hold and cooperate in controlling motion. Lower chest: Median sternotomy. Hepatobiliary: Borderline intrahepatic biliary dilatation. Common hepatic duct 0.5 cm and common bile duct likewise 0.5 cm. Conical distal tapering course of the CBD without definite filling defect identified. No abnormal hepatic parenchymal enhancement or parenchymal lesion. Expected fluid and gas along the gallbladder fossa and trace edema tracking adjacent to the right hepatic lobe. Pancreas:  Unremarkable Spleen:  Unremarkable Adrenals/Urinary Tract:  Unremarkable Stomach/Bowel: Sigmoid colon diverticulosis. Vascular/Lymphatic:  Unremarkable Other:  No supplemental non-categorized findings. Musculoskeletal: Fluid signal  about 1.7 cm in thickness tracks between the right rectus abdominus and right transverse abdominus musculature in the right subcostal region deep to a right subcostal trocar site. There is also some edema tracking around the right rib cage in deep to the right external oblique muscle for example on image 29 series 5. This fluid is high T2 and intermediate T1 signal characteristics. On post-contrast images there some small vessels enhancing along this fluid but no findings of active bleeding into this collection. There is evidence of lower thoracic and lumbar spondylosis and degenerative disc disease. IMPRESSION: 1. Borderline intrahepatic biliary dilatation, with the common hepatic duct and common bile duct both measuring 0.5 cm. No definite filling defect is identified in the CBD. 2. Expected fluid and gas along the gallbladder fossa and trace edema tracking adjacent to the right hepatic lobe. 3. Fluid signal about 1.7 cm in thickness tracks between the right rectus abdominus and right transverse abdominus musculature in the right subcostal region deep to a right subcostal trocar tract. There is also some edema tracking around the right rib cage in deep to the right external oblique muscle. This fluid is high T2 and intermediate T1 signal characteristics. On post-contrast images there some small vessels enhancing along this fluid but no findings of active bleeding into this collection. This is favored to be a postoperative seroma. 4. Sigmoid colon diverticulosis. 5. 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 Electronically Signed   By: Freida Jes M.D.   On: 03/24/2024 20:23   MR 3D Recon At Scanner Result Date: 03/24/2024 CLINICAL DATA:  Postop day 1 status post cholecystectomy, concern for biliary obstruction. EXAM: MRI ABDOMEN WITHOUT AND WITH CONTRAST (INCLUDING MRCP) TECHNIQUE: Multiplanar multisequence MR imaging of the abdomen was performed both before and after the  administration of intravenous contrast. Heavily T2-weighted images of the biliary and pancreatic ducts were obtained, and three-dimensional MRCP images were rendered by post processing. CONTRAST:  10mL GADAVIST GADOBUTROL 1 MMOL/ML IV SOLN COMPARISON:  CT abdomen 03/22/2024 FINDINGS: Despite efforts by the technologist and patient, motion artifact is present on today's exam and could not be eliminated. This reduces exam sensitivity and specificity. This is a common result when MRCP is attempted in the inpatient setting where patients are less likely to be able to breath hold and cooperate in controlling motion. Lower chest: Median sternotomy. Hepatobiliary: Borderline intrahepatic biliary dilatation. Common hepatic duct 0.5 cm and common bile duct likewise 0.5 cm. Conical distal tapering course of the CBD without definite filling defect identified. No abnormal hepatic parenchymal enhancement or parenchymal lesion.  Expected fluid and gas along the gallbladder fossa and trace edema tracking adjacent to the right hepatic lobe. Pancreas:  Unremarkable Spleen:  Unremarkable Adrenals/Urinary Tract:  Unremarkable Stomach/Bowel: Sigmoid colon diverticulosis. Vascular/Lymphatic:  Unremarkable Other:  No supplemental non-categorized findings. Musculoskeletal: Fluid signal about 1.7 cm in thickness tracks between the right rectus abdominus and right transverse abdominus musculature in the right subcostal region deep to a right subcostal trocar site. There is also some edema tracking around the right rib cage in deep to the right external oblique muscle for example on image 29 series 5. This fluid is high T2 and intermediate T1 signal characteristics. On post-contrast images there some small vessels enhancing along this fluid but no findings of active bleeding into this collection. There is evidence of lower thoracic and lumbar spondylosis and degenerative disc disease. IMPRESSION: 1. Borderline intrahepatic biliary dilatation,  with the common hepatic duct and common bile duct both measuring 0.5 cm. No definite filling defect is identified in the CBD. 2. Expected fluid and gas along the gallbladder fossa and trace edema tracking adjacent to the right hepatic lobe. 3. Fluid signal about 1.7 cm in thickness tracks between the right rectus abdominus and right transverse abdominus musculature in the right subcostal region deep to a right subcostal trocar tract. There is also some edema tracking around the right rib cage in deep to the right external oblique muscle. This fluid is high T2 and intermediate T1 signal characteristics. On post-contrast images there some small vessels enhancing along this fluid but no findings of active bleeding into this collection. This is favored to be a postoperative seroma. 4. Sigmoid colon diverticulosis. 5. 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 Electronically Signed   By: Freida Jes M.D.   On: 03/24/2024 20:23   US  Abdomen Limited RUQ (LIVER/GB) Result Date: 03/22/2024 CLINICAL DATA:  Right upper quadrant pain EXAM: ULTRASOUND ABDOMEN LIMITED RIGHT UPPER QUADRANT COMPARISON:  CT of the abdomen and pelvis performed Mar 22, 2024 FINDINGS: Gallbladder: Calcified gallstones with posterior shadowing. The gallbladder wall measures 3.77 mm. No pericholecystic fluid is identified. Sonographic Abigail Abler sign was not reported. Common bile duct: Diameter: 4 mm Liver: Heterogeneous echotexture of the liver. No focal liver lesion. No intrahepatic biliary ductal dilatation. Portal vein is patent on color Doppler imaging with normal direction of blood flow towards the liver. Other: None. IMPRESSION: 1. Gallstones with mildly thickened gallbladder wall. No pericholecystic fluid. Electronically Signed   By: Reagan Camera M.D.   On: 03/22/2024 08:01   CT ABDOMEN PELVIS W CONTRAST Result Date: 03/22/2024 CLINICAL DATA:  Acute, nonlocalized abdominal pain EXAM: CT ABDOMEN AND PELVIS WITH  CONTRAST TECHNIQUE: Multidetector CT imaging of the abdomen and pelvis was performed using the standard protocol following bolus administration of intravenous contrast. RADIATION DOSE REDUCTION: This exam was performed according to the departmental dose-optimization program which includes automated exposure control, adjustment of the mA and/or kV according to patient size and/or use of iterative reconstruction technique. CONTRAST:  75mL OMNIPAQUE  IOHEXOL  350 MG/ML SOLN COMPARISON:  04/10/2009 FINDINGS: Lower chest:  Extensive coronary atherosclerosis. Hepatobiliary: No focal liver abnormality.Numerous gallstones. No evidence of acute cholecystitis Pancreas: Unremarkable. Spleen: Numerous granulomatous type calcification. Adrenals/Urinary Tract: Negative adrenals. No hydronephrosis or stone. Unremarkable bladder. Stomach/Bowel: No obstruction. Lax ileocolic mesentery, cecum in the epigastrium. Multiple distal colonic diverticula. Vascular/Lymphatic: No acute vascular abnormality. Scattered atheromatous calcification, moderately extensive. No mass or adenopathy. Reproductive:No pathologic findings. Other: No ascites or pneumoperitoneum. Musculoskeletal: No acute abnormalities. Advanced lumbar spine degeneration  with scoliosis. Right hip replacement. IMPRESSION: No acute finding. Atherosclerosis, cholelithiasis, and colonic diverticulosis. Electronically Signed   By: Ronnette Coke M.D.   On: 03/22/2024 06:13   DG Chest 2 View Result Date: 03/22/2024 CLINICAL DATA:  Chest pain. EXAM: CHEST - 2 VIEW COMPARISON:  February 06, 2023 FINDINGS: Multiple sternal wires and vascular clips are seen. The heart size and mediastinal contours are within normal limits. Low lung volumes are noted with mild, stable elevation of the right hemidiaphragm. Mild left basilar atelectasis and/or infiltrate is noted. No pleural effusion or pneumothorax is identified. Multilevel degenerative changes seen throughout the thoracic spine.  IMPRESSION: 1. Evidence of prior median sternotomy/CABG. 2. Mild left basilar atelectasis and/or infiltrate. Electronically Signed   By: Virgle Grime M.D.   On: 03/22/2024 01:20     Discharge Exam: Vitals:   03/25/24 0821 03/25/24 0951  BP: (!) 143/56 (!) 143/56  Pulse: (!) 53 (!) 53  Resp: 17   Temp: 97.8 F (36.6 C)   SpO2: 97%    Vitals:   03/24/24 1931 03/25/24 0434 03/25/24 0821 03/25/24 0951  BP: (!) 106/51 133/60 (!) 143/56 (!) 143/56  Pulse: (!) 49 (!) 46 (!) 53 (!) 53  Resp: 16 17 17    Temp: 98.3 F (36.8 C) 98.1 F (36.7 C) 97.8 F (36.6 C)   TempSrc:   Oral   SpO2: 94% 98% 97%   Weight:      Height:        General: Pt is alert, awake, not in acute distress Cardiovascular: RRR, S1/S2 +, no rubs, no gallops Respiratory: CTA bilaterally, no wheezing, no rhonchi Abdominal: Soft, NT, ND, bowel sounds + Extremities: no edema, no cyanosis    The results of significant diagnostics from this hospitalization (including imaging, microbiology, ancillary and laboratory) are listed below for reference.     Microbiology: Recent Results (from the past 240 hours)  Surgical pcr screen     Status: None   Collection Time: 03/23/24 12:10 AM   Specimen: Nasal Mucosa; Nasal Swab  Result Value Ref Range Status   MRSA, PCR NEGATIVE NEGATIVE Final   Staphylococcus aureus NEGATIVE NEGATIVE Final    Comment: (NOTE) The Xpert SA Assay (FDA approved for NASAL specimens in patients 25 years of age and older), is one component of a comprehensive surveillance program. It is not intended to diagnose infection nor to guide or monitor treatment. Performed at [facility], [address] [place], [place] [address]      Labs: BNP (last 3 results) No results for input(s): "BNP" in the last 8760 hours. Basic Metabolic Panel: Recent Labs  Lab 03/22/24 0101 03/24/24 0538 03/24/24 0824 03/25/24 0710  NA 138 136 135 136  K 4.0 4.4 3.9 4.1  CL 103 106 105 103  CO2 26  27 26 27   GLUCOSE 136* 113* 132* 103*  BUN 15 15 14 11   CREATININE 0.78 0.82 0.81 0.71  CALCIUM  9.4 8.4* 8.4* 8.8*   Liver Function Tests: Recent Labs  Lab 03/22/24 0254 03/24/24 0538 03/24/24 0824 03/25/24 0710  AST 33 382* 315* 223*  ALT 44 336* 312* 311*  ALKPHOS 55 99 94 124  BILITOT 1.0 2.2* 2.3* 1.1  PROT 6.1* 5.2* 5.3* 5.6*  ALBUMIN 3.5 2.7* 2.8* 3.0*   Recent Labs  Lab 03/22/24 0254  LIPASE 37   No results for input(s): "AMMONIA" in the last 168 hours. CBC: Recent Labs  Lab 03/22/24 0101 03/24/24 0538 03/25/24 0710  WBC 8.2  7.4 6.5  NEUTROABS  --  5.4 4.6  HGB 15.8 13.8 14.2  HCT 47.3 40.7 41.8  MCV 96.7 95.1 95.0  PLT 237 165 170   Cardiac Enzymes: No results for input(s): "CKTOTAL", "CKMB", "CKMBINDEX", "TROPONINI" in the last 168 hours. BNP: Invalid input(s): "POCBNP" CBG: No results for input(s): "GLUCAP" in the last 168 hours. D-Dimer No results for input(s): "DDIMER" in the last 72 hours. Hgb A1c No results for input(s): "HGBA1C" in the last 72 hours. Lipid Profile No results for input(s): "CHOL", "HDL", "LDLCALC", "TRIG", "CHOLHDL", "LDLDIRECT" in the last 72 hours. Thyroid  function studies No results for input(s): "TSH", "T4TOTAL", "T3FREE", "THYROIDAB" in the last 72 hours.  Invalid input(s): "FREET3" Anemia work up No results for input(s): "VITAMINB12", "FOLATE", "FERRITIN", "TIBC", "IRON", "RETICCTPCT" in the last 72 hours. Urinalysis    Component Value Date/Time   COLORURINE YELLOW 02/17/2024 0958   APPEARANCEUR CLEAR 02/17/2024 0958   LABSPEC 1.020 02/17/2024 0958   PHURINE 6.0 02/17/2024 0958   GLUCOSEU >=1000 (A) 02/17/2024 0958   HGBUR NEGATIVE 02/17/2024 0958   HGBUR negative 05/30/2008 1340   BILIRUBINUR NEGATIVE 02/17/2024 0958   KETONESUR NEGATIVE 02/17/2024 0958   PROTEINUR NEGATIVE 05/26/2009 1800   UROBILINOGEN 0.2 02/17/2024 0958   NITRITE NEGATIVE 02/17/2024 0958   LEUKOCYTESUR NEGATIVE 02/17/2024 0958   Sepsis  Labs Recent Labs  Lab 03/22/24 0101 03/24/24 0538 03/25/24 0710  WBC 8.2 7.4 6.5   Microbiology Recent Results (from the past 240 hours)  Surgical pcr screen     Status: None   Collection Time: 03/23/24 12:10 AM   Specimen: Nasal Mucosa; Nasal Swab  Result Value Ref Range Status   MRSA, PCR NEGATIVE NEGATIVE Final   Staphylococcus aureus NEGATIVE NEGATIVE Final    Comment: (NOTE) The Xpert SA Assay (FDA approved for NASAL specimens in patients 74 years of age and older), is one component of a comprehensive surveillance program. It is not intended to diagnose infection nor to guide or monitor treatment. Performed at [facility], [address] [place], [place] [address]     FURTHER DISCHARGE INSTRUCTIONS:   Get Medicines reviewed and adjusted: Please take all your medications with you for your next visit with your Primary MD   Laboratory/radiological data: Please request your Primary MD to go over all hospital tests and procedure/radiological results at the follow up, please ask your Primary MD to get all Hospital records sent to his/her office.   In some cases, they will be blood work, cultures and biopsy results pending at the time of your discharge. Please request that your primary care M.D. goes through all the records of your hospital data and follows up on these results.   Also Note the following: If you experience worsening of your admission symptoms, develop shortness of breath, life threatening emergency, suicidal or homicidal thoughts you must seek medical attention immediately by calling 911 or calling your MD immediately  if symptoms less severe.   You must read complete instructions/literature along with all the possible adverse reactions/side effects for all the Medicines you take and that have been prescribed to you. Take any new Medicines after you have completely understood and accpet all the possible adverse reactions/side effects.    patient was  instructed, not to drive, operate heavy machinery, perform activities at heights, swimming or participation in water activities or provide baby-sitting services while on Pain, Sleep and Anxiety Medications; until their outpatient Physician has advised to do so again. Also recommended to not  to take more than prescribed Pain, Sleep and Anxiety Medications.  It is not advisable to combine anxiety, sleep and pain medications without talking with your primary care provider.     Wear Seat belts while driving.   Please note: You were cared for by a hospitalist during your hospital stay. Once you are discharged, your primary care physician will handle any further medical issues. Please note that NO REFILLS for any discharge medications will be authorized once you are discharged, as it is imperative that you return to your primary care physician (or establish a relationship with a primary care physician if you do not have one) for your post hospital discharge needs so that they can reassess your need for medications and monitor your lab values  Time coordinating discharge: Over 30 minutes  SIGNED:   Modena Andes, MD  [facility] 03/25/2024, 10:26 AM *Please note that this is a verbal dictation therefore any spelling or grammatical errors are due to the "[employer]agon [employer] One" system interpretation. If 7PM-7AM, please contact night-coverage [email or web address]

## 2024-03-25 NOTE — Plan of Care (Signed)
  Problem: Health Behavior/Discharge Planning: Goal: Ability to manage health-related needs will improve Outcome: Adequate for Discharge   Problem: Clinical Measurements: Goal: Ability to maintain clinical measurements within normal limits will improve Outcome: Adequate for Discharge Goal: Will remain free from infection Outcome: Adequate for Discharge Goal: Diagnostic test results will improve Outcome: Adequate for Discharge Goal: Respiratory complications will improve Outcome: Adequate for Discharge Goal: Cardiovascular complication will be avoided Outcome: Adequate for Discharge   Problem: Activity: Goal: Risk for activity intolerance will decrease Outcome: Adequate for Discharge   Problem: Nutrition: Goal: Adequate nutrition will be maintained Outcome: Adequate for Discharge   Problem: Coping: Goal: Level of anxiety will decrease Outcome: Adequate for Discharge   Problem: Elimination: Goal: Will not experience complications related to bowel motility Outcome: Adequate for Discharge Goal: Will not experience complications related to urinary retention Outcome: Adequate for Discharge   Problem: Pain Managment: Goal: General experience of comfort will improve and/or be controlled Outcome: Adequate for Discharge   Problem: Safety: Goal: Ability to remain free from injury will improve Outcome: Adequate for Discharge   Problem: Skin Integrity: Goal: Risk for impaired skin integrity will decrease Outcome: Adequate for Discharge   Problem: Education: Goal: Required Educational Video(s) Outcome: Adequate for Discharge   Problem: Clinical Measurements: Goal: Postoperative complications will be avoided or minimized Outcome: Adequate for Discharge   Problem: Skin Integrity: Goal: Demonstration of wound healing without infection will improve Outcome: Adequate for Discharge

## 2024-03-25 NOTE — Progress Notes (Signed)
 2 Days Post-Op  Subjective: No new complaints.  Tolerating solid diet.  Pain controlled with tylenol  and ibuprofen .  Objective: Vital signs in last 24 hours: Temp:  [98.1 F (36.7 C)-99.1 F (37.3 C)] 98.1 F (36.7 C) (05/29 0434) Pulse Rate:  [46-57] 46 (05/29 0434) Resp:  [16-18] 17 (05/29 0821) BP: (106-135)/(48-65) 133/60 (05/29 0434) SpO2:  [94 %-98 %] 98 % (05/29 0434)    Intake/Output from previous day: 05/28 0701 - 05/29 0700 In: 594 [P.O.:594] Out: -  Intake/Output this shift: No intake/output data recorded.  PE: Gen: NAD Abd: soft, appropriately tender, +BS, ND, incisions c/d/I, ecchymosis noted at umbilical incision  Lab Results:  Recent Labs    03/24/24 0538 03/25/24 0710  WBC 7.4 6.5  HGB 13.8 14.2  HCT 40.7 41.8  PLT 165 170   BMET Recent Labs    03/24/24 0538 03/24/24 0824  NA 136 135  K 4.4 3.9  CL 106 105  CO2 27 26  GLUCOSE 113* 132*  BUN 15 14  CREATININE 0.82 0.81  CALCIUM  8.4* 8.4*   PT/INR No results for input(s): "LABPROT", "INR" in the last 72 hours. CMP     Component Value Date/Time   NA 135 03/24/2024 0824   NA 139 09/06/2022 1057   K 3.9 03/24/2024 0824   CL 105 03/24/2024 0824   CO2 26 03/24/2024 0824   GLUCOSE 132 (H) 03/24/2024 0824   BUN 14 03/24/2024 0824   BUN 17 09/06/2022 1057   CREATININE 0.81 03/24/2024 0824   CREATININE 1.14 11/01/2022 1159   CALCIUM  8.4 (L) 03/24/2024 0824   PROT 5.3 (L) 03/24/2024 0824   ALBUMIN 2.8 (L) 03/24/2024 0824   AST 315 (H) 03/24/2024 0824   ALT 312 (H) 03/24/2024 0824   ALKPHOS 94 03/24/2024 0824   BILITOT 2.3 (H) 03/24/2024 0824   GFRNONAA >60 03/24/2024 0824   GFRAA  06/01/2009 0445    >60        The eGFR has been calculated using the MDRD equation. This calculation has not been validated in all clinical situations. eGFR's persistently <60 mL/min signify possible Chronic Kidney Disease.   Lipase     Component Value Date/Time   LIPASE 37 03/22/2024 0254        Studies/Results: MR ABDOMEN MRCP W WO CONTAST Result Date: 03/24/2024 CLINICAL DATA:  Postop day 1 status post cholecystectomy, concern for biliary obstruction. EXAM: MRI ABDOMEN WITHOUT AND WITH CONTRAST (INCLUDING MRCP) TECHNIQUE: Multiplanar multisequence MR imaging of the abdomen was performed both before and after the administration of intravenous contrast. Heavily T2-weighted images of the biliary and pancreatic ducts were obtained, and three-dimensional MRCP images were rendered by post processing. CONTRAST:  10mL GADAVIST  GADOBUTROL  1 MMOL/ML IV SOLN COMPARISON:  CT abdomen 03/22/2024 FINDINGS: Despite efforts by the technologist and patient, motion artifact is present on today's exam and could not be eliminated. This reduces exam sensitivity and specificity. This is a common result when MRCP is attempted in the inpatient setting where patients are less likely to be able to breath hold and cooperate in controlling motion. Lower chest: Median sternotomy. Hepatobiliary: Borderline intrahepatic biliary dilatation. Common hepatic duct 0.5 cm and common bile duct likewise 0.5 cm. Conical distal tapering course of the CBD without definite filling defect identified. No abnormal hepatic parenchymal enhancement or parenchymal lesion. Expected fluid and gas along the gallbladder fossa and trace edema tracking adjacent to the right hepatic lobe. Pancreas:  Unremarkable Spleen:  Unremarkable Adrenals/Urinary Tract:  Unremarkable  Stomach/Bowel: Sigmoid colon diverticulosis. Vascular/Lymphatic:  Unremarkable Other:  No supplemental non-categorized findings. Musculoskeletal: Fluid signal about 1.7 cm in thickness tracks between the right rectus abdominus and right transverse abdominus musculature in the right subcostal region deep to a right subcostal trocar site. There is also some edema tracking around the right rib cage in deep to the right external oblique muscle for example on image 29 series 5. This  fluid is high T2 and intermediate T1 signal characteristics. On post-contrast images there some small vessels enhancing along this fluid but no findings of active bleeding into this collection. There is evidence of lower thoracic and lumbar spondylosis and degenerative disc disease. IMPRESSION: 1. Borderline intrahepatic biliary dilatation, with the common hepatic duct and common bile duct both measuring 0.5 cm. No definite filling defect is identified in the CBD. 2. Expected fluid and gas along the gallbladder fossa and trace edema tracking adjacent to the right hepatic lobe. 3. Fluid signal about 1.7 cm in thickness tracks between the right rectus abdominus and right transverse abdominus musculature in the right subcostal region deep to a right subcostal trocar tract. There is also some edema tracking around the right rib cage in deep to the right external oblique muscle. This fluid is high T2 and intermediate T1 signal characteristics. On post-contrast images there some small vessels enhancing along this fluid but no findings of active bleeding into this collection. This is favored to be a postoperative seroma. 4. Sigmoid colon diverticulosis. 5. 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 Electronically Signed   By: Freida Jes M.D.   On: 03/24/2024 20:23   MR 3D Recon At Scanner Result Date: 03/24/2024 CLINICAL DATA:  Postop day 1 status post cholecystectomy, concern for biliary obstruction. EXAM: MRI ABDOMEN WITHOUT AND WITH CONTRAST (INCLUDING MRCP) TECHNIQUE: Multiplanar multisequence MR imaging of the abdomen was performed both before and after the administration of intravenous contrast. Heavily T2-weighted images of the biliary and pancreatic ducts were obtained, and three-dimensional MRCP images were rendered by post processing. CONTRAST:  10mL GADAVIST  GADOBUTROL  1 MMOL/ML IV SOLN COMPARISON:  CT abdomen 03/22/2024 FINDINGS: Despite efforts by the technologist and  patient, motion artifact is present on today's exam and could not be eliminated. This reduces exam sensitivity and specificity. This is a common result when MRCP is attempted in the inpatient setting where patients are less likely to be able to breath hold and cooperate in controlling motion. Lower chest: Median sternotomy. Hepatobiliary: Borderline intrahepatic biliary dilatation. Common hepatic duct 0.5 cm and common bile duct likewise 0.5 cm. Conical distal tapering course of the CBD without definite filling defect identified. No abnormal hepatic parenchymal enhancement or parenchymal lesion. Expected fluid and gas along the gallbladder fossa and trace edema tracking adjacent to the right hepatic lobe. Pancreas:  Unremarkable Spleen:  Unremarkable Adrenals/Urinary Tract:  Unremarkable Stomach/Bowel: Sigmoid colon diverticulosis. Vascular/Lymphatic:  Unremarkable Other:  No supplemental non-categorized findings. Musculoskeletal: Fluid signal about 1.7 cm in thickness tracks between the right rectus abdominus and right transverse abdominus musculature in the right subcostal region deep to a right subcostal trocar site. There is also some edema tracking around the right rib cage in deep to the right external oblique muscle for example on image 29 series 5. This fluid is high T2 and intermediate T1 signal characteristics. On post-contrast images there some small vessels enhancing along this fluid but no findings of active bleeding into this collection. There is evidence of lower thoracic and lumbar spondylosis and degenerative disc disease.  IMPRESSION: 1. Borderline intrahepatic biliary dilatation, with the common hepatic duct and common bile duct both measuring 0.5 cm. No definite filling defect is identified in the CBD. 2. Expected fluid and gas along the gallbladder fossa and trace edema tracking adjacent to the right hepatic lobe. 3. Fluid signal about 1.7 cm in thickness tracks between the right rectus abdominus  and right transverse abdominus musculature in the right subcostal region deep to a right subcostal trocar tract. There is also some edema tracking around the right rib cage in deep to the right external oblique muscle. This fluid is high T2 and intermediate T1 signal characteristics. On post-contrast images there some small vessels enhancing along this fluid but no findings of active bleeding into this collection. This is favored to be a postoperative seroma. 4. Sigmoid colon diverticulosis. 5. 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 Electronically Signed   By: Freida Jes M.D.   On: 03/24/2024 20:23    Anti-infectives: Anti-infectives (From admission, onward)    Start     Dose/Rate Route Frequency Ordered Stop   03/22/24 1200  cefTRIAXone  (ROCEPHIN ) 2 g in sodium chloride  0.9 % 100 mL IVPB        2 g 200 mL/hr over 30 Minutes Intravenous Every 24 hours 03/22/24 1102          Assessment/Plan POD 2, s/p lap chole, Dr. Camilo Cella 03/23/24 -LFTs bumped yesterday.  MRCP negative.  Repeat CMET pending.  If LFTs down trending, then patient stable for DC home. -d/w primary team.   -otherwise patient looks good today and is feeling well.  If LFTs ok and plan to DC home, may resume Plavix  today as well. -ambulate and pulm toilet -follow up arranged  FEN - regular diet VTE - may resume plavix  if LFTs ok, Lovenox ID - none currently    LOS: 0 days    Leone Ralphs , [facility] 03/25/2024, 8:24 AM Please see Amion for pager number during day hours 7:00am-4:30pm or 7:00am -11:30am on weekends

## 2024-03-29 ENCOUNTER — Ambulatory Visit: Admitting: Internal Medicine

## 2024-03-30 ENCOUNTER — Encounter: Payer: Self-pay | Admitting: *Deleted

## 2024-03-30 ENCOUNTER — Ambulatory Visit: Admitting: Adult Health

## 2024-03-30 ENCOUNTER — Encounter: Payer: Self-pay | Admitting: Adult Health

## 2024-03-30 VITALS — BP 123/70 | HR 53 | Ht 72.0 in | Wt 251.2 lb

## 2024-03-30 DIAGNOSIS — Z6834 Body mass index (BMI) 34.0-34.9, adult: Secondary | ICD-10-CM | POA: Diagnosis not present

## 2024-03-30 DIAGNOSIS — Z87891 Personal history of nicotine dependence: Secondary | ICD-10-CM | POA: Diagnosis not present

## 2024-03-30 DIAGNOSIS — Z006 Encounter for examination for normal comparison and control in clinical research program: Secondary | ICD-10-CM

## 2024-03-30 DIAGNOSIS — E669 Obesity, unspecified: Secondary | ICD-10-CM | POA: Diagnosis not present

## 2024-03-30 DIAGNOSIS — G4733 Obstructive sleep apnea (adult) (pediatric): Secondary | ICD-10-CM

## 2024-03-30 DIAGNOSIS — Z9981 Dependence on supplemental oxygen: Secondary | ICD-10-CM | POA: Diagnosis not present

## 2024-03-30 NOTE — Patient Instructions (Addendum)
 Continue on CPAP At bedtime  .  Keep up good work .  Work on healthy weight .  Do not drive if sleepy .  Follow up with Dr. Villa Greaser or Lakethia Coppess NP In 1 year and As needed

## 2024-03-30 NOTE — Assessment & Plan Note (Signed)
 Healthy weight loss discussed

## 2024-03-30 NOTE — Assessment & Plan Note (Signed)
 Excellent control and compliance on nocturnal CPAP  Plan  Patient Instructions  Continue on CPAP At bedtime  .  Keep up good work .  Work on healthy weight .  Do not drive if sleepy .  Follow up with Dr. Villa Greaser or Faron Whitelock NP In 1 year and As needed

## 2024-03-30 NOTE — Progress Notes (Signed)
 @Patient  ID: Steven Munoz, male    DOB: 03/11/1941, 83 y.o.   MRN: [id number]  Chief Complaint  Patient presents with   Follow-up    Referring provider: Ezell Hollow, MD  HPI: 83 year old male followed for obstructive sleep apnea Medical history significant for coronary artery disease status post CABG, cardiomyopathy and diastolic heart failure  TEST/EVENTS :  PSG (289 Lbs) 2009 -  severe OSA with predominant hypopneas, AHI 31/h corrected by CPAP +8 with large quattro mask . PLms  improved with titration   03/30/2024 Follow up ; OSA Patient presents for a 1 year follow-up.  Patient has underlying severe obstructive sleep apnea is on nocturnal CPAP.  Says he is doing well on CPAP.  Wears his CPAP every single night.  Feels that he benefits from CPAP with decreased daytime sleepiness.  CPAP download shows excellent compliance with daily average usage at 7 hours.  He is on CPAP 8 cm H2O.  AHI 1.8/hour.  Using a fullface mask.  [employer] is his DME company. Patient says he had gallbladder surgery last week.  Has done very well.  Has already returned back to the gym was able to walk on the treadmill for 30 minutes yesterday.    Allergies  Allergen Reactions   Povidone-Iodine Rash   Ofloxacin  Other (See Comments)    Affected sleep with eye drop formulation   Tape Rash and Other (See Comments)    Skin tears easily Cloth adhesive tape-per patient    Immunization History  Administered Date(s) Administered   Fluad Quad(high Dose 65+) 07/06/2019, 07/04/2022   Influenza Split 08/21/2011, 08/26/2012   Influenza Whole 07/29/2008, 08/10/2009, 08/28/2010   Influenza, High Dose Seasonal PF 08/13/2016, 08/12/2017, 08/26/2018, 07/04/2020, 07/24/2021, 08/08/2023   Influenza,inj,Quad PF,6+ Mos 08/31/2013, 08/16/2014, 07/25/2015   PFIZER Comirnaty(Gray Top)Covid-19 Tri-Sucrose Vaccine 03/22/2021   PFIZER(Purple Top)SARS-COV-2 Vaccination 11/10/2019, 11/30/2019, 08/26/2020   PNEUMOCOCCAL  CONJUGATE-20 11/05/2021   Pfizer Covid-19 Vaccine Bivalent Booster 81yrs & up 07/20/2021   Pfizer(Comirnaty)Fall Seasonal Vaccine 12 years and older 08/23/2022   Pneumococcal Conjugate-13 05/09/2015   Pneumococcal Polysaccharide-23 05/27/2011   Respiratory Syncytial Virus Vaccine,Recomb Aduvanted(Arexvy) 10/17/2023   Td 05/18/2010   Tdap 07/04/2020   Zoster Recombinant(Shingrix ) 07/04/2022, 09/09/2022   Zoster, Live 06/06/2016    Past Medical History:  Diagnosis Date   Allergy 2004   Tape,   Arthritis    CAD (coronary artery disease) CABG  05/29/09    OPERATIVE PROCEDURES:  Median sternotomy, extracorporeal circulation,    Cataract    Colonic polyp 2002, 2005, 2009   Dr Andriette Keeling   Dermatophytosis of nail    Diverticulosis    Dysmetabolic syndrome    GERD (gastroesophageal reflux disease)    S/P dilation  2005   Gout    Hyperlipidemia    Hypertension    Hypothyroidism    Melanoma (HCC) 08/22/2023   Murmur    Nephrolithiasis    X 1   Peripheral neuropathy 03/20/2021   Pneumonia age 40   treated as inpatient   Sleep apnea    on CPAP; Dr Villa Greaser    Tobacco History: Social History   Tobacco Use  Smoking Status Former   Current packs/day: 0.00   Average packs/day: 1 pack/day for 6.0 years (6.0 ttl pk-yrs)   Types: Cigarettes   Start date: 10/28/1960   Quit date: 10/28/1966   Years since quitting: 57.4  Smokeless Tobacco Never  Tobacco Comments   smoked 1962- 1968, up to < 1 ppd   Counseling  given: No Tobacco comments: smoked 1962- 1968, up to < 1 ppd   Outpatient Medications Prior to Visit  Medication Sig Dispense Refill   acetaminophen  (TYLENOL ) 500 MG tablet Take 2 tablets (1,000 mg total) by mouth every 6 (six) hours as needed.     allopurinol  (ZYLOPRIM ) 100 MG tablet Take 1 tablet (100 mg total) by mouth daily. 90 tablet 3   aspirin  EC 81 MG tablet Take 81 mg by mouth daily.     clopidogrel  (PLAVIX ) 75 MG tablet TAKE 1 TABLET BY MOUTH DAILY WITH BREAKFAST. 90  tablet 3   Coenzyme Q10 (COQ10 PO) Take 1 capsule by mouth daily.     colestipol  (COLESTID ) 1 g tablet Take 1 g by mouth daily.     Cyanocobalamin  (VITAMIN B-12 PO) Take 1 tablet by mouth daily.     dapagliflozin  propanediol (FARXIGA ) 10 MG TABS tablet Take 1 tablet (10 mg total) by mouth daily. 90 tablet 3   famotidine  (PEPCID ) 40 MG tablet Take 40 mg by mouth at bedtime.     levothyroxine  (SYNTHROID ) 125 MCG tablet Take 1 tablet (125 mcg total) by mouth daily before breakfast. 90 tablet 1   magnesium  oxide (MAG-OX) 400 (240 Mg) MG tablet Take 400 mg by mouth at bedtime.     metoprolol  succinate (TOPROL -XL) 25 MG 24 hr tablet TAKE 1 TABLET (25 MG TOTAL) BY MOUTH DAILY. 90 tablet 1   Multiple Vitamins-Minerals (PRESERVISION AREDS 2+MULTI VIT) CAPS Take 1 capsule by mouth 2 (two) times daily.      nitroGLYCERIN  (NITROSTAT ) 0.4 MG SL tablet PLACE 1 TABLET UNDER THE TONGUE EVERY 5 MINUTES AS NEEDED. 75 tablet 1   Omega-3 Fatty Acids (FISH OIL PO) Take 1 capsule by mouth daily.     rosuvastatin  (CRESTOR ) 40 MG tablet TAKE 1 TABLET BY MOUTH EVERY DAY 90 tablet 2   sacubitril -valsartan  (ENTRESTO ) 49-51 MG Take 1 tablet by mouth 2 (two) times daily. 180 tablet 3   spironolactone  (ALDACTONE ) 25 MG tablet Take 0.5 tablets (12.5 mg total) by mouth daily. 45 tablet 3   furosemide  (LASIX ) 20 MG tablet Take 1 tablet (20 mg total) by mouth daily as needed. 30 tablet 1   hydrALAZINE  (APRESOLINE ) 10 MG tablet Take 1 tablet (10 mg total) by mouth 2 (two) times daily as needed. Take one tablet as needed for systolic BP >150 60 tablet 0   No facility-administered medications prior to visit.     Review of Systems:   Constitutional:   No  weight loss, night sweats,  Fevers, chills, fatigue, or  lassitude.  HEENT:   No headaches,  Difficulty swallowing,  Tooth/dental problems, or  Sore throat,                No sneezing, itching, ear ache, nasal congestion, post nasal drip,   CV:  No chest pain,  Orthopnea,  PND, swelling in lower extremities, anasarca, dizziness, palpitations, syncope.   GI  No heartburn, indigestion, abdominal pain, nausea, vomiting, diarrhea, change in bowel habits, loss of appetite, bloody stools.   Resp: No shortness of breath with exertion or at rest.  No excess mucus, no productive cough,  No non-productive cough,  No coughing up of blood.  No change in color of mucus.  No wheezing.  No chest wall deformity  Skin: no rash or lesions.  GU: no dysuria, change in color of urine, no urgency or frequency.  No flank pain, no hematuria   MS:  No joint pain or  swelling.  No decreased range of motion.  No back pain.    Physical Exam  BP 123/70 (BP Location: Left Arm, Patient Position: Sitting, Cuff Size: Large)   Pulse (!) 53   Ht 6' (1.829 m)   Wt 251 lb 3.2 oz (113.9 kg)   SpO2 97%   BMI 34.07 kg/m   GEN: A/Ox3; pleasant , NAD, well nourished    HEENT:  [place]/AT,   NOSE-clear, THROAT-clear, no lesions, no postnasal drip or exudate noted.   NECK:  Supple w/ fair ROM; no JVD; normal carotid impulses w/o bruits; no thyromegaly or nodules palpated; no lymphadenopathy.    RESP  Clear  P & A; w/o, wheezes/ rales/ or rhonchi. no accessory muscle use, no dullness to percussion  CARD:  RRR, no m/r/g, no peripheral edema, pulses intact, no cyanosis or clubbing.  GI:   Soft & nt; nml bowel sounds; no organomegaly or masses detected.   Musco: Warm bil, no deformities or joint swelling noted.   Neuro: alert, no focal deficits noted.    Skin: Warm, no lesions or rashes    Lab Results:  CBC    Component Value Date/Time   WBC 6.5 03/25/2024 0710   RBC 4.40 03/25/2024 0710   HGB 14.2 03/25/2024 0710   HCT 41.8 03/25/2024 0710   PLT 170 03/25/2024 0710   MCV 95.0 03/25/2024 0710   MCH 32.3 03/25/2024 0710   MCHC 34.0 03/25/2024 0710   RDW 13.6 03/25/2024 0710   LYMPHSABS 1.1 03/25/2024 0710   MONOABS 0.6 03/25/2024 0710   EOSABS 0.2 03/25/2024 0710   BASOSABS 0.1  03/25/2024 0710    BMET    Component Value Date/Time   NA 136 03/25/2024 0710   NA 139 09/06/2022 1057   K 4.1 03/25/2024 0710   CL 103 03/25/2024 0710   CO2 27 03/25/2024 0710   GLUCOSE 103 (H) 03/25/2024 0710   BUN 11 03/25/2024 0710   BUN 17 09/06/2022 1057   CREATININE 0.71 03/25/2024 0710   CREATININE 1.14 11/01/2022 1159   CALCIUM  8.8 (L) 03/25/2024 0710   GFRNONAA >60 03/25/2024 0710   GFRAA  06/01/2009 0445    >60        The eGFR has been calculated using the MDRD equation. This calculation has not been validated in all clinical situations. eGFR's persistently <60 mL/min signify possible Chronic Kidney Disease.    BNP    Component Value Date/Time   BNP 281.2 (H) 02/13/2023 1442    ProBNP    Component Value Date/Time   PROBNP 90 09/06/2022 1057    Imaging: MR ABDOMEN MRCP W WO CONTAST Result Date: 03/24/2024 CLINICAL DATA:  Postop day 1 status post cholecystectomy, concern for biliary obstruction. EXAM: MRI ABDOMEN WITHOUT AND WITH CONTRAST (INCLUDING MRCP) TECHNIQUE: Multiplanar multisequence MR imaging of the abdomen was performed both before and after the administration of intravenous contrast. Heavily T2-weighted images of the biliary and pancreatic ducts were obtained, and three-dimensional MRCP images were rendered by post processing. CONTRAST:  10mL GADAVIST  GADOBUTROL  1 MMOL/ML IV SOLN COMPARISON:  CT abdomen 03/22/2024 FINDINGS: Despite efforts by the technologist and patient, motion artifact is present on today's exam and could not be eliminated. This reduces exam sensitivity and specificity. This is a common result when MRCP is attempted in the inpatient setting where patients are less likely to be able to breath hold and cooperate in controlling motion. Lower chest: Median sternotomy. Hepatobiliary: Borderline intrahepatic biliary dilatation. Common hepatic duct 0.5 cm and  common bile duct likewise 0.5 cm. Conical distal tapering course of the CBD  without definite filling defect identified. No abnormal hepatic parenchymal enhancement or parenchymal lesion. Expected fluid and gas along the gallbladder fossa and trace edema tracking adjacent to the right hepatic lobe. Pancreas:  Unremarkable Spleen:  Unremarkable Adrenals/Urinary Tract:  Unremarkable Stomach/Bowel: Sigmoid colon diverticulosis. Vascular/Lymphatic:  Unremarkable Other:  No supplemental non-categorized findings. Musculoskeletal: Fluid signal about 1.7 cm in thickness tracks between the right rectus abdominus and right transverse abdominus musculature in the right subcostal region deep to a right subcostal trocar site. There is also some edema tracking around the right rib cage in deep to the right external oblique muscle for example on image 29 series 5. This fluid is high T2 and intermediate T1 signal characteristics. On post-contrast images there some small vessels enhancing along this fluid but no findings of active bleeding into this collection. There is evidence of lower thoracic and lumbar spondylosis and degenerative disc disease. IMPRESSION: 1. Borderline intrahepatic biliary dilatation, with the common hepatic duct and common bile duct both measuring 0.5 cm. No definite filling defect is identified in the CBD. 2. Expected fluid and gas along the gallbladder fossa and trace edema tracking adjacent to the right hepatic lobe. 3. Fluid signal about 1.7 cm in thickness tracks between the right rectus abdominus and right transverse abdominus musculature in the right subcostal region deep to a right subcostal trocar tract. There is also some edema tracking around the right rib cage in deep to the right external oblique muscle. This fluid is high T2 and intermediate T1 signal characteristics. On post-contrast images there some small vessels enhancing along this fluid but no findings of active bleeding into this collection. This is favored to be a postoperative seroma. 4. Sigmoid colon  diverticulosis. 5. 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 Electronically Signed   By: Freida Jes M.D.   On: 03/24/2024 20:23   MR 3D Recon At Scanner Result Date: 03/24/2024 CLINICAL DATA:  Postop day 1 status post cholecystectomy, concern for biliary obstruction. EXAM: MRI ABDOMEN WITHOUT AND WITH CONTRAST (INCLUDING MRCP) TECHNIQUE: Multiplanar multisequence MR imaging of the abdomen was performed both before and after the administration of intravenous contrast. Heavily T2-weighted images of the biliary and pancreatic ducts were obtained, and three-dimensional MRCP images were rendered by post processing. CONTRAST:  10mL GADAVIST  GADOBUTROL  1 MMOL/ML IV SOLN COMPARISON:  CT abdomen 03/22/2024 FINDINGS: Despite efforts by the technologist and patient, motion artifact is present on today's exam and could not be eliminated. This reduces exam sensitivity and specificity. This is a common result when MRCP is attempted in the inpatient setting where patients are less likely to be able to breath hold and cooperate in controlling motion. Lower chest: Median sternotomy. Hepatobiliary: Borderline intrahepatic biliary dilatation. Common hepatic duct 0.5 cm and common bile duct likewise 0.5 cm. Conical distal tapering course of the CBD without definite filling defect identified. No abnormal hepatic parenchymal enhancement or parenchymal lesion. Expected fluid and gas along the gallbladder fossa and trace edema tracking adjacent to the right hepatic lobe. Pancreas:  Unremarkable Spleen:  Unremarkable Adrenals/Urinary Tract:  Unremarkable Stomach/Bowel: Sigmoid colon diverticulosis. Vascular/Lymphatic:  Unremarkable Other:  No supplemental non-categorized findings. Musculoskeletal: Fluid signal about 1.7 cm in thickness tracks between the right rectus abdominus and right transverse abdominus musculature in the right subcostal region deep to a right subcostal trocar site. There is also  some edema tracking around the right rib cage in deep to  the right external oblique muscle for example on image 29 series 5. This fluid is high T2 and intermediate T1 signal characteristics. On post-contrast images there some small vessels enhancing along this fluid but no findings of active bleeding into this collection. There is evidence of lower thoracic and lumbar spondylosis and degenerative disc disease. IMPRESSION: 1. Borderline intrahepatic biliary dilatation, with the common hepatic duct and common bile duct both measuring 0.5 cm. No definite filling defect is identified in the CBD. 2. Expected fluid and gas along the gallbladder fossa and trace edema tracking adjacent to the right hepatic lobe. 3. Fluid signal about 1.7 cm in thickness tracks between the right rectus abdominus and right transverse abdominus musculature in the right subcostal region deep to a right subcostal trocar tract. There is also some edema tracking around the right rib cage in deep to the right external oblique muscle. This fluid is high T2 and intermediate T1 signal characteristics. On post-contrast images there some small vessels enhancing along this fluid but no findings of active bleeding into this collection. This is favored to be a postoperative seroma. 4. Sigmoid colon diverticulosis. 5. 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 Electronically Signed   By: Freida Jes M.D.   On: 03/24/2024 20:23   US  Abdomen Limited RUQ (LIVER/GB) Result Date: 03/22/2024 CLINICAL DATA:  Right upper quadrant pain EXAM: ULTRASOUND ABDOMEN LIMITED RIGHT UPPER QUADRANT COMPARISON:  CT of the abdomen and pelvis performed Mar 22, 2024 FINDINGS: Gallbladder: Calcified gallstones with posterior shadowing. The gallbladder wall measures 3.77 mm. No pericholecystic fluid is identified. Sonographic Abigail Abler sign was not reported. Common bile duct: Diameter: 4 mm Liver: Heterogeneous echotexture of the liver. No focal  liver lesion. No intrahepatic biliary ductal dilatation. Portal vein is patent on color Doppler imaging with normal direction of blood flow towards the liver. Other: None. IMPRESSION: 1. Gallstones with mildly thickened gallbladder wall. No pericholecystic fluid. Electronically Signed   By: Reagan Camera M.D.   On: 03/22/2024 08:01   CT ABDOMEN PELVIS W CONTRAST Result Date: 03/22/2024 CLINICAL DATA:  Acute, nonlocalized abdominal pain EXAM: CT ABDOMEN AND PELVIS WITH CONTRAST TECHNIQUE: Multidetector CT imaging of the abdomen and pelvis was performed using the standard protocol following bolus administration of intravenous contrast. RADIATION DOSE REDUCTION: This exam was performed according to the departmental dose-optimization program which includes automated exposure control, adjustment of the mA and/or kV according to patient size and/or use of iterative reconstruction technique. CONTRAST:  75mL OMNIPAQUE  IOHEXOL  350 MG/ML SOLN COMPARISON:  04/10/2009 FINDINGS: Lower chest:  Extensive coronary atherosclerosis. Hepatobiliary: No focal liver abnormality.Numerous gallstones. No evidence of acute cholecystitis Pancreas: Unremarkable. Spleen: Numerous granulomatous type calcification. Adrenals/Urinary Tract: Negative adrenals. No hydronephrosis or stone. Unremarkable bladder. Stomach/Bowel: No obstruction. Lax ileocolic mesentery, cecum in the epigastrium. Multiple distal colonic diverticula. Vascular/Lymphatic: No acute vascular abnormality. Scattered atheromatous calcification, moderately extensive. No mass or adenopathy. Reproductive:No pathologic findings. Other: No ascites or pneumoperitoneum. Musculoskeletal: No acute abnormalities. Advanced lumbar spine degeneration with scoliosis. Right hip replacement. IMPRESSION: No acute finding. Atherosclerosis, cholelithiasis, and colonic diverticulosis. Electronically Signed   By: Ronnette Coke M.D.   On: 03/22/2024 06:13   DG Chest 2 View Result Date:  03/22/2024 CLINICAL DATA:  Chest pain. EXAM: CHEST - 2 VIEW COMPARISON:  February 06, 2023 FINDINGS: Multiple sternal wires and vascular clips are seen. The heart size and mediastinal contours are within normal limits. Low lung volumes are noted with mild, stable elevation of the right hemidiaphragm. Mild left  basilar atelectasis and/or infiltrate is noted. No pleural effusion or pneumothorax is identified. Multilevel degenerative changes seen throughout the thoracic spine. IMPRESSION: 1. Evidence of prior median sternotomy/CABG. 2. Mild left basilar atelectasis and/or infiltrate. Electronically Signed   By: Virgle Grime M.D.   On: 03/22/2024 01:20    Administration History     None           No data to display          No results found for: "NITRICOXIDE"      Assessment & Plan:   OSA on CPAP Excellent control and compliance on nocturnal CPAP  Plan  Patient Instructions  Continue on CPAP At bedtime  .  Keep up good work .  Work on healthy weight .  Do not drive if sleepy .  Follow up with Dr. Villa Greaser or Brax Walen NP In 1 year and As needed      Obesity (BMI 30-39.9) Healthy weight loss discussed     Roena Clark, NP 03/30/2024

## 2024-03-31 ENCOUNTER — Ambulatory Visit (INDEPENDENT_AMBULATORY_CARE_PROVIDER_SITE_OTHER): Admitting: Internal Medicine

## 2024-03-31 ENCOUNTER — Encounter: Payer: Self-pay | Admitting: Internal Medicine

## 2024-03-31 VITALS — BP 126/76 | HR 55 | Temp 97.6°F | Resp 16 | Ht 72.0 in | Wt 249.2 lb

## 2024-03-31 DIAGNOSIS — K819 Cholecystitis, unspecified: Secondary | ICD-10-CM

## 2024-03-31 DIAGNOSIS — R319 Hematuria, unspecified: Secondary | ICD-10-CM

## 2024-03-31 LAB — CBC WITH DIFFERENTIAL/PLATELET
Basophils Absolute: 0 10*3/uL (ref 0.0–0.1)
Basophils Relative: 0.8 % (ref 0.0–3.0)
Eosinophils Absolute: 0.2 10*3/uL (ref 0.0–0.7)
Eosinophils Relative: 3.1 % (ref 0.0–5.0)
HCT: 46.9 % (ref 39.0–52.0)
Hemoglobin: 15.6 g/dL (ref 13.0–17.0)
Lymphocytes Relative: 23 % (ref 12.0–46.0)
Lymphs Abs: 1.2 10*3/uL (ref 0.7–4.0)
MCHC: 33.3 g/dL (ref 30.0–36.0)
MCV: 95.9 fl (ref 78.0–100.0)
Monocytes Absolute: 0.5 10*3/uL (ref 0.1–1.0)
Monocytes Relative: 8.6 % (ref 3.0–12.0)
Neutro Abs: 3.5 10*3/uL (ref 1.4–7.7)
Neutrophils Relative %: 64.5 % (ref 43.0–77.0)
Platelets: 197 10*3/uL (ref 150.0–400.0)
RBC: 4.88 Mil/uL (ref 4.22–5.81)
RDW: 14.4 % (ref 11.5–15.5)
WBC: 5.4 10*3/uL (ref 4.0–10.5)

## 2024-03-31 LAB — COMPREHENSIVE METABOLIC PANEL WITH GFR
ALT: 91 U/L — ABNORMAL HIGH (ref 0–53)
AST: 42 U/L — ABNORMAL HIGH (ref 0–37)
Albumin: 4.4 g/dL (ref 3.5–5.2)
Alkaline Phosphatase: 106 U/L (ref 39–117)
BUN: 21 mg/dL (ref 6–23)
CO2: 30 meq/L (ref 19–32)
Calcium: 9.7 mg/dL (ref 8.4–10.5)
Chloride: 101 meq/L (ref 96–112)
Creatinine, Ser: 0.77 mg/dL (ref 0.40–1.50)
GFR: 82.89 mL/min (ref >60.00)
Glucose, Bld: 97 mg/dL (ref 70–99)
Potassium: 4.3 meq/L (ref 3.5–5.1)
Sodium: 138 meq/L (ref 135–145)
Total Bilirubin: 1.4 mg/dL — ABNORMAL HIGH (ref 0.2–1.2)
Total Protein: 7 g/dL (ref 6.0–8.3)

## 2024-03-31 NOTE — Progress Notes (Unsigned)
 Subjective:    Patient ID: Steven Munoz, male    DOB: 18-Apr-1941, 83 y.o.   MRN: [id number]  DOS:  03/31/2024 Type of visit - description: Hospital follow-up  Admitted to the hospital 5/26, discharge 3 days later.  Presented with statin epigastric pain. Was eventually diagnosed with acute cholelithiasis and cholecystitis, s/p cholecystectomy 03/23/2024.  LFTs were elevated close surgery, MRCP showed CBD dilatation but no obstruction. LFTs improved.  At this point he is back at home and feeling well. No fever or chills No nausea vomiting.  No diarrhea. Appetite is good with no postprandial symptoms. Denies leg swelling. No gross hematuria Review of Systems See above   Past Medical History:  Diagnosis Date   Allergy 2004   Tape,   Arthritis    CAD (coronary artery disease) CABG  05/29/09    OPERATIVE PROCEDURES:  Median sternotomy, extracorporeal circulation,    Cataract    Colonic polyp 2002, 2005, 2009   Dr Andriette Keeling   Dermatophytosis of nail    Diverticulosis    Dysmetabolic syndrome    GERD (gastroesophageal reflux disease)    S/P dilation  2005   Gout    Hyperlipidemia    Hypertension    Hypothyroidism    Melanoma (HCC) 08/22/2023   Murmur    Nephrolithiasis    X 1   Peripheral neuropathy 03/20/2021   Pneumonia age 75   treated as inpatient   Sleep apnea    on CPAP; Dr Villa Greaser    Past Surgical History:  Procedure Laterality Date   athroscopy     right knee X2, left knee X1   CARDIAC CATHETERIZATION     CHOLECYSTECTOMY N/A 03/23/2024   Procedure: LAPAROSCOPIC CHOLECYSTECTOMY;  Surgeon: Melvenia Stabs, MD;  Location: [facility] OR;  Service: General;  Laterality: N/A;  WITH ICG DYE   COLONOSCOPY  2002 & 2009 &2014   polypectomy; Dr Andriette Keeling   CORONARY ARTERY BYPASS GRAFT  2010   Median sternotomy, extracorporeal circulation, coronary bypass graft surgery x3 using a left internal mammary artery graft to left anterior descending coronary artery, with a sequential   saphenous vein graft to posterior descending and posterolateral branches  of the right coronary artery.  Endoscopic vein harvesting from the right   EYE SURGERY Right    tear duct   JOINT REPLACEMENT  Right knee 2004, right hip 2015   LEFT HEART CATH AND CORONARY ANGIOGRAPHY N/A 01/26/2023   Procedure: Coronary/Graft Acute MI Revascularization;  Surgeon: Arleen Lacer, MD;  Location: [facility] INVASIVE CV LAB;  Service: Cardiovascular;  Laterality: N/A;   Nail Alvusion Bilateral    Hallux   NOSE SURGERY  11/20/2012   L max antrectomy;septoplasty;turbinate reduction. Dr Thompson Flight   REPLACEMENT TOTAL KNEE     right 2003   REPLACEMENT TOTAL KNEE     partial Left March 2009   TENDON RELEASE Right 03/2016   from R hip. @ [name]   TOTAL HIP ARTHROPLASTY  02/22/2014   right hip; Dr [facility]    Current Outpatient Medications  Medication Instructions   acetaminophen  (TYLENOL ) 1,000 mg, Oral, Every 6 hours PRN   allopurinol  (ZYLOPRIM ) 100 mg, Oral, Daily   aspirin  EC 81 mg, Daily   clopidogrel  (PLAVIX ) 75 mg, Oral, Daily with breakfast   Coenzyme Q10 (COQ10 PO) 1 capsule, Daily   colestipol  (COLESTID ) 1 g, Daily   Cyanocobalamin  (VITAMIN B-12 PO) 1 tablet, Daily   dapagliflozin  propanediol (FARXIGA ) 10 mg, Oral, Daily   famotidine  (PEPCID )  40 mg, Daily at bedtime   furosemide  (LASIX ) 20 mg, Oral, Daily PRN   hydrALAZINE  (APRESOLINE ) 10 mg, Oral, 2 times daily PRN, Take one tablet as needed for systolic BP >150   levothyroxine  (SYNTHROID ) 125 mcg, Oral, Daily before breakfast   magnesium  oxide (MAG-OX) 400 mg, Daily at bedtime   metoprolol  succinate (TOPROL -XL) 25 mg, Oral, Daily   Multiple Vitamins-Minerals (PRESERVISION AREDS 2+MULTI VIT) CAPS 1 capsule, 2 times daily   nitroGLYCERIN  (NITROSTAT ) 0.4 MG SL tablet PLACE 1 TABLET UNDER THE TONGUE EVERY 5 MINUTES AS NEEDED.   Omega-3 Fatty Acids (FISH OIL PO) 1 capsule, Daily   rosuvastatin  (CRESTOR ) 40 mg, Oral, Daily    sacubitril -valsartan  (ENTRESTO ) 49-51 MG 1 tablet, Oral, 2 times daily   spironolactone  (ALDACTONE ) 12.5 mg, Oral, Daily       Objective:   Physical Exam BP 126/76   Pulse (!) 55   Temp 97.6 F (36.4 C) (Oral)   Resp 16   Ht 6' (1.829 m)   Wt 249 lb 4 oz (113.1 kg)   SpO2 98%   BMI 33.80 kg/m  General:   Well developed, NAD, BMI noted.  HEENT:  Normocephalic . Face symmetric, atraumatic Lungs:  CTA B Normal respiratory effort, no intercostal retractions, no accessory muscle use. Heart: RRR, soft systolic murmur Abdomen:  Not distended, soft, non-tender. No rebound or rigidity.  Surgical scar with no redness or discharge Skin: Not pale. Not jaundice Lower extremities: no pretibial edema bilaterally  Neurologic:  alert & oriented X3.  Speech normal, gait appropriate for age and unassisted Psych--  Cognition and judgment appear intact.  Cooperative with normal attention span and concentration.  Behavior appropriate. No anxious or depressed appearing.     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  No clear evidence of radiculopathy. 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  Dr. 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  The patient is feeling well with no GI symptoms. Plan: CMP CBC.  Has a follow-up  with surgery June 24.  To seek attention if he has GI symptoms. Gross hematuria: Currently asymptomatic, request recheck a UA urine culture.  Will do RTC as scheduled for July

## 2024-03-31 NOTE — Patient Instructions (Signed)
   Check the  blood pressure regularly Blood pressure goal:  between 110/65 and  135/85. If it is consistently higher or lower, let me know     GO TO THE LAB :  Get the blood work   Your results will be posted on MyChart with my comments   See you in July

## 2024-04-01 LAB — URINE CULTURE
MICRO NUMBER:: 16537937
Result:: NO GROWTH
SPECIMEN QUALITY:: ADEQUATE

## 2024-04-01 NOTE — Assessment & Plan Note (Signed)
 Cholecystectomy:Recently admitted to the hospital, had a cholecystectomy 03/23/2024, LFTs were elevated, MRCP showed CBD dilatation but no obstruction.  The patient is feeling well with no GI symptoms. Plan: CMP CBC.  Has a follow-up with surgery June 24.  To seek attention if he has GI symptoms. Gross hematuria: h/o currently asymptomatic, request recheck a UA urine culture.  Will do RTC as scheduled for July

## 2024-04-05 ENCOUNTER — Ambulatory Visit: Payer: Self-pay | Admitting: Internal Medicine

## 2024-04-05 DIAGNOSIS — S46012D Strain of muscle(s) and tendon(s) of the rotator cuff of left shoulder, subsequent encounter: Secondary | ICD-10-CM | POA: Diagnosis not present

## 2024-04-05 DIAGNOSIS — M25612 Stiffness of left shoulder, not elsewhere classified: Secondary | ICD-10-CM | POA: Diagnosis not present

## 2024-04-05 DIAGNOSIS — M19012 Primary osteoarthritis, left shoulder: Secondary | ICD-10-CM | POA: Diagnosis not present

## 2024-04-05 DIAGNOSIS — M6281 Muscle weakness (generalized): Secondary | ICD-10-CM | POA: Diagnosis not present

## 2024-04-06 ENCOUNTER — Ambulatory Visit: Admitting: Internal Medicine

## 2024-04-12 DIAGNOSIS — M6281 Muscle weakness (generalized): Secondary | ICD-10-CM | POA: Diagnosis not present

## 2024-04-12 DIAGNOSIS — M25612 Stiffness of left shoulder, not elsewhere classified: Secondary | ICD-10-CM | POA: Diagnosis not present

## 2024-04-12 DIAGNOSIS — S46012D Strain of muscle(s) and tendon(s) of the rotator cuff of left shoulder, subsequent encounter: Secondary | ICD-10-CM | POA: Diagnosis not present

## 2024-04-12 DIAGNOSIS — M19012 Primary osteoarthritis, left shoulder: Secondary | ICD-10-CM | POA: Diagnosis not present

## 2024-04-23 DIAGNOSIS — M6281 Muscle weakness (generalized): Secondary | ICD-10-CM | POA: Diagnosis not present

## 2024-04-23 DIAGNOSIS — S46012D Strain of muscle(s) and tendon(s) of the rotator cuff of left shoulder, subsequent encounter: Secondary | ICD-10-CM | POA: Diagnosis not present

## 2024-04-23 DIAGNOSIS — M19012 Primary osteoarthritis, left shoulder: Secondary | ICD-10-CM | POA: Diagnosis not present

## 2024-04-23 DIAGNOSIS — M25612 Stiffness of left shoulder, not elsewhere classified: Secondary | ICD-10-CM | POA: Diagnosis not present

## 2024-04-26 ENCOUNTER — Encounter: Payer: Self-pay | Admitting: Internal Medicine

## 2024-04-26 DIAGNOSIS — E785 Hyperlipidemia, unspecified: Secondary | ICD-10-CM

## 2024-04-26 DIAGNOSIS — R739 Hyperglycemia, unspecified: Secondary | ICD-10-CM

## 2024-05-07 NOTE — Progress Notes (Signed)
 Patient ID: Elsie DELENA Laos, male   DOB: [birth year], 83 y.o.   MRN: [id number]     83 y.o. CABG August 2010 He had ostial LAD, distal RCA/PDA disease and had LIMA to LAD SVG Sequential to PDA and PLB EF normal circumflex not grafted. Dr Jude follow him for OSA and CPAP Activity limited by right hip pain post surgery at [facility] and f/u tendon release. Dr Chrys Hails.  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    Seen in ED 02/06/23 for elevated BP and atypical chest pain. R/O ARB changed to entresto  and PRN hydralazine  called in   Echo  12/08/17 AV sclerosis severe LAE 57 mm EF normal reviewd Echo 06/18/22 EF 60-65% mild AS mean gradient 10 peak 18.7 mmHg DVI 0.43 AVA 1.54 cm2  Echo 01/26/23  EF 40-45% RV normal Moderate LAE trivial MR AV sclerosis  Blocked tear ducts Has had multiple surgeries but left one still blocked Has had vertigo and peripheral neuropathy R xwith neurontin    Given neuropathy in feet no longer takes his Healy/Porshe to VIR Is going to gym 3x/week   He has a son Rozelle who is dentist in [place] with two grand sons   Had some dyspnea when I saw him in April  2024 and BNP mildly elevated improved with addition of aldactone   Weight down 40 lbs had some Moews surgery on right side of face. Compliant with meds   Had lap cholecystectomy 03/23/24.     ROS: Denies fever, malais, weight loss, blurry vision, decreased visual acuity, cough, sputum, SOB, hemoptysis, pleuritic pain, palpitaitons, heartburn, abdominal pain, melena, lower extremity edema, claudication, or rash.  All other systems reviewed and negative  General: BP 128/64   Pulse 61   Ht 6' (1.829 m)   Wt 256 lb 6.4 oz (116.3 kg)   SpO2 94%   BMI 34.77 kg/m   Affect appropriate Overweight white male  RLE varicosities HEENT: Tinnitus  Neck supple with no adenopathy JVP normal no bruits no thyromegaly Lungs clear with no wheezing and good diaphragmatic motion Heart:  S1/S2 SEM  murmur, no rub, gallop or click PMI normal Abdomen: benighn, BS positve, no tenderness, no AAA no bruit.  No HSM or HJR post lap cholecystectomy Distal pulses intact with no bruits Peripheral neuropathy in feet  Skin warm and dry Right TKR with RLE varicosities and mild edema    Current Outpatient Medications  Medication Sig Dispense Refill   acetaminophen  (TYLENOL ) 500 MG tablet Take 2 tablets (1,000 mg total) by mouth every 6 (six) hours as needed.     allopurinol  (ZYLOPRIM ) 100 MG tablet Take 1 tablet (100 mg total) by mouth daily. 90 tablet 3   aspirin  EC 81 MG tablet Take 81 mg by mouth daily.     clopidogrel  (PLAVIX ) 75 MG tablet TAKE 1 TABLET BY MOUTH DAILY WITH BREAKFAST. 90 tablet 3   Coenzyme Q10 (COQ10 PO) Take 1 capsule by mouth daily.     colestipol  (COLESTID ) 1 g tablet Take 1 g by mouth daily.     Cyanocobalamin  (VITAMIN B-12 PO) Take 1 tablet by mouth daily.     dapagliflozin  propanediol (FARXIGA ) 10 MG TABS tablet Take 1 tablet (10 mg total) by mouth daily.  90 tablet 3   famotidine  (PEPCID ) 40 MG tablet Take 40 mg by mouth at bedtime.     furosemide  (LASIX ) 20 MG tablet Take 1 tablet (20 mg total) by mouth daily as needed. 30 tablet 1   hydrALAZINE  (APRESOLINE ) 10 MG tablet Take 1 tablet (10 mg total) by mouth 2 (two) times daily as needed. Take one tablet as needed for systolic BP >150 60 tablet 0   levothyroxine  (SYNTHROID ) 125 MCG tablet Take 1 tablet (125 mcg total) by mouth daily before breakfast. 90 tablet 1   magnesium  oxide (MAG-OX) 400 (240 Mg) MG tablet Take 400 mg by mouth at bedtime.     metoprolol  succinate (TOPROL -XL) 25 MG 24 hr tablet TAKE 1 TABLET (25 MG TOTAL) BY MOUTH DAILY. 90 tablet 1   Multiple Vitamins-Minerals (PRESERVISION  AREDS 2+MULTI VIT) CAPS Take 1 capsule by mouth 2 (two) times daily.      nitroGLYCERIN  (NITROSTAT ) 0.4 MG SL tablet PLACE 1 TABLET UNDER THE TONGUE EVERY 5 MINUTES AS NEEDED. 75 tablet 1   Omega-3 Fatty Acids (FISH OIL PO) Take 1 capsule by mouth daily.     rosuvastatin  (CRESTOR ) 40 MG tablet Take 1 tablet (40 mg total) by mouth daily. 90 tablet 1   sacubitril -valsartan  (ENTRESTO ) 49-51 MG Take 1 tablet by mouth 2 (two) times daily. 180 tablet 3   spironolactone  (ALDACTONE ) 25 MG tablet Take 0.5 tablets (12.5 mg total) by mouth daily. 45 tablet 3   No current facility-administered medications for this visit.    Allergies  Povidone-iodine, Ofloxacin , and Tape  Electrocardiogram:  08/14/20 SR rate 53 normal 05/19/2024 NSR rate 62 PR 222 msec stable 05/19/2024 SR rate 52 PR 216 msec   Assessment and Plan  Murmur: AV sclerosis on 01/26/23     CAD/CABG: 2010 [place] to LAD and SVG to PDA/PLB  SEMI 01/2023 with thrombotic lesion to distal anastomosis of PDA graft Rx medically Complicated by large retroperitoneal bleed No vessel extravasation on CT 01/29/23  Hct stable 02/06/23 35.4 Continue ASA/Plavix  given thrombotic lesion  EF reduced 40-45% post MI Ischemic DCM Rx with aldactone , entresto , toprol  , Farxiga  and lasix   PAF: post op no recurrence ASA and beta blocker LA severely dilated on 58 mm on TTE  Puts him at increased risk of recurrence   HTN: ARB changed to Entresto  02/06/23 with PRN hydralazine   improved .    Thyroid :  Lab Results  Component Value Date   TSH 1.47 02/17/2024    Cholesterol  Lab Results  Component Value Date   LDLCALC 56 05/10/2024    Ortho: post right hip surgery [name]  Post tendon release in June 2018  with relief of pain  First Degree: no change on ECG  no high grade AV block PR 216 msec  ENT:  F/u [name] bells palsy resolved still with tinnitus wearing hearing aids post tear duct surgery   Dyspnea:  improved with aldactone  K 4.3 Cr 0.77 BUN 21 labs 03/31/24     F/U in 6 months     Maude Emmer

## 2024-05-10 ENCOUNTER — Other Ambulatory Visit (INDEPENDENT_AMBULATORY_CARE_PROVIDER_SITE_OTHER)

## 2024-05-10 DIAGNOSIS — R739 Hyperglycemia, unspecified: Secondary | ICD-10-CM

## 2024-05-10 DIAGNOSIS — E785 Hyperlipidemia, unspecified: Secondary | ICD-10-CM

## 2024-05-10 LAB — LIPID PANEL
Cholesterol: 126 mg/dL (ref 0–200)
HDL: 48.1 mg/dL (ref >39.00)
LDL Cholesterol: 56 mg/dL (ref 0–99)
NonHDL: 78.31
Total CHOL/HDL Ratio: 3
Triglycerides: 111 mg/dL (ref 0.0–149.0)
VLDL: 22.2 mg/dL (ref 0.0–40.0)

## 2024-05-10 LAB — HEMOGLOBIN A1C: Hgb A1c MFr Bld: 5.5 % (ref 4.6–6.5)

## 2024-05-10 LAB — HEPATIC FUNCTION PANEL
ALT: 16 U/L (ref 0–53)
AST: 17 U/L (ref 0–37)
Albumin: 4.5 g/dL (ref 3.5–5.2)
Alkaline Phosphatase: 58 U/L (ref 39–117)
Bilirubin, Direct: 0.3 mg/dL (ref 0.0–0.3)
Total Bilirubin: 1.4 mg/dL — ABNORMAL HIGH (ref 0.2–1.2)
Total Protein: 6.7 g/dL (ref 6.0–8.3)

## 2024-05-12 ENCOUNTER — Ambulatory Visit: Payer: Self-pay | Admitting: Internal Medicine

## 2024-05-13 ENCOUNTER — Other Ambulatory Visit: Payer: Self-pay

## 2024-05-13 MED ORDER — ROSUVASTATIN CALCIUM 40 MG PO TABS: 40.0000 mg | ORAL_TABLET | Freq: Every day | ORAL | 1 refills | Status: DC

## 2024-05-18 ENCOUNTER — Encounter: Payer: Self-pay | Admitting: Internal Medicine

## 2024-05-18 ENCOUNTER — Ambulatory Visit (INDEPENDENT_AMBULATORY_CARE_PROVIDER_SITE_OTHER): Admitting: Internal Medicine

## 2024-05-18 VITALS — BP 136/82 | HR 55 | Temp 98.0°F | Resp 18 | Ht 72.0 in | Wt 254.4 lb

## 2024-05-18 DIAGNOSIS — R739 Hyperglycemia, unspecified: Secondary | ICD-10-CM | POA: Diagnosis not present

## 2024-05-18 DIAGNOSIS — I25810 Atherosclerosis of coronary artery bypass graft(s) without angina pectoris: Secondary | ICD-10-CM

## 2024-05-18 DIAGNOSIS — E785 Hyperlipidemia, unspecified: Secondary | ICD-10-CM | POA: Diagnosis not present

## 2024-05-18 NOTE — Assessment & Plan Note (Signed)
 Hyperglycemia: Last A1c 5.5.  Diet controlled. HTN: BP today satisfactory, reports good ambulatory BPs.  Continue same medicines. Hyperlipidemia: Last LDL 56.  No change Cardiovascular: On multiple medicines, no volume overload, no cardiac symptoms.  Continue same medications. Cholecystitis: Since last visit, LFTs normalized Gross hematuria: Now asymptomatic.  Had a cystoscopy, urology planned a CT however he ended up in the hospital.  CT A/P while admitted showed a normal GU system. RTC 4 months CPX

## 2024-05-18 NOTE — Progress Notes (Signed)
 Subjective:    Patient ID: Steven Munoz, male    DOB: 1941/06/01, 83 y.o.   MRN: [id number]  DOS:  05/18/2024 Type of visit - description: Follow-up  Routine follow-up. Feeling well. Denies abdominal pain nausea or vomiting. No LUTS, specifically no gross hematuria. Likes to review recent labs  Wt Readings from Last 3 Encounters:  05/18/24 254 lb 6 oz (115.4 kg)  03/31/24 249 lb 4 oz (113.1 kg)  03/30/24 251 lb 3.2 oz (113.9 kg)     Review of Systems See above   Past Medical History:  Diagnosis Date   Allergy 2004   Tape,   Arthritis    CAD (coronary artery disease) CABG  05/29/09    OPERATIVE PROCEDURES:  Median sternotomy, extracorporeal circulation,    Cataract    Colonic polyp 2002, 2005, 2009   Dr Luis   Dermatophytosis of nail    Diverticulosis    Dysmetabolic syndrome    GERD (gastroesophageal reflux disease)    S/P dilation  2005   Gout    Hyperlipidemia    Hypertension    Hypothyroidism    Melanoma (HCC) 08/22/2023   Murmur    Nephrolithiasis    X 1   Peripheral neuropathy 03/20/2021   Pneumonia age 13   treated as inpatient   Sleep apnea    on CPAP; Dr Jude    Past Surgical History:  Procedure Laterality Date   athroscopy     right knee X2, left knee X1   CARDIAC CATHETERIZATION     CHOLECYSTECTOMY N/A 03/23/2024   Procedure: LAPAROSCOPIC CHOLECYSTECTOMY;  Surgeon: Teresa Lonni HERO, MD;  Location: [facility] OR;  Service: General;  Laterality: N/A;  WITH ICG DYE   COLONOSCOPY  2002 & 2009 &2014   polypectomy; Dr Luis   CORONARY ARTERY BYPASS GRAFT  2010   Median sternotomy, extracorporeal circulation, coronary bypass graft surgery x3 using a left internal mammary artery graft to left anterior descending coronary artery, with a sequential  saphenous vein graft to posterior descending and posterolateral branches  of the right coronary artery.  Endoscopic vein harvesting from the right   EYE SURGERY Right    tear duct   JOINT REPLACEMENT  Right  knee 2004, right hip 2015   LEFT HEART CATH AND CORONARY ANGIOGRAPHY N/A 01/26/2023   Procedure: Coronary/Graft Acute MI Revascularization;  Surgeon: Anner Alm ORN, MD;  Location: [facility] INVASIVE CV LAB;  Service: Cardiovascular;  Laterality: N/A;   Nail Alvusion Bilateral    Hallux   NOSE SURGERY  11/20/2012   L max antrectomy;septoplasty;turbinate reduction. Dr Deloras Sina CHILD   REPLACEMENT TOTAL KNEE     right 2003   REPLACEMENT TOTAL KNEE     partial Left March 2009   TENDON RELEASE Right 03/2016   from R hip. @ [name]   TOTAL HIP ARTHROPLASTY  02/22/2014   right hip; Dr [facility]    Current Outpatient Medications  Medication Instructions   acetaminophen  (TYLENOL ) 1,000 mg, Oral, Every 6 hours PRN   allopurinol  (ZYLOPRIM ) 100 mg, Oral, Daily   aspirin  EC 81 mg, Daily   clopidogrel  (PLAVIX ) 75 mg, Oral, Daily with breakfast   Coenzyme Q10 (COQ10 PO) 1 capsule, Daily   colestipol  (COLESTID ) 1 g, Daily   Cyanocobalamin  (VITAMIN B-12 PO) 1 tablet, Daily   dapagliflozin  propanediol (FARXIGA ) 10 mg, Oral, Daily   famotidine  (PEPCID ) 40 mg, Daily at bedtime   furosemide  (LASIX ) 20 mg, Oral, Daily PRN   hydrALAZINE  (APRESOLINE ) 10 mg, Oral,  2 times daily PRN, Take one tablet as needed for systolic BP >150   levothyroxine  (SYNTHROID ) 125 mcg, Oral, Daily before breakfast   magnesium  oxide (MAG-OX) 400 mg, Daily at bedtime   metoprolol  succinate (TOPROL -XL) 25 mg, Oral, Daily   Multiple Vitamins-Minerals (PRESERVISION AREDS 2+MULTI VIT) CAPS 1 capsule, 2 times daily   nitroGLYCERIN  (NITROSTAT ) 0.4 MG SL tablet PLACE 1 TABLET UNDER THE TONGUE EVERY 5 MINUTES AS NEEDED.   Omega-3 Fatty Acids (FISH OIL PO) 1 capsule, Daily   rosuvastatin  (CRESTOR ) 40 mg, Oral, Daily   sacubitril -valsartan  (ENTRESTO ) 49-51 MG 1 tablet, Oral, 2 times daily   spironolactone  (ALDACTONE ) 12.5 mg, Oral, Daily       Objective:   Physical Exam BP 136/82   Pulse (!) 55   Temp 98 F (36.7 C) (Oral)    Resp 18   Ht 6' (1.829 m)   Wt 254 lb 6 oz (115.4 kg)   SpO2 97%   BMI 34.50 kg/m  General:   Well developed, NAD, BMI noted. HEENT:  Normocephalic . Face symmetric, atraumatic Lungs:  CTA B Normal respiratory effort, no intercostal retractions, no accessory muscle use. Heart: RRR,  no murmur.  Lower extremities: no pretibial edema bilaterally  Skin: Not pale. Not jaundice Neurologic:  alert & oriented X3.  Speech normal, gait appropriate for age and unassisted Psych--  Cognition and judgment appear intact.  Cooperative with normal attention span and concentration.  Behavior appropriate. No anxious or depressed appearing.      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  No clear evidence of radiculopathy. 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  Dr. 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  CT A/P 03/22/2024 while admitted to hospital: Normal GU system   PLAN: Hyperglycemia: Last A1c 5.5.  Diet controlled. HTN: BP today satisfactory, reports good ambulatory BPs.  Continue same medicines. Hyperlipidemia: Last LDL 56.  No change Cardiovascular: On multiple medicines, no volume overload, no cardiac symptoms.  Continue same medications. Cholecystitis: Since last visit, LFTs normalized Gross hematuria: Now asymptomatic.  Had a cystoscopy, urology planned a CT however he ended up in the hospital.  CT A/P while admitted showed  a normal GU system. RTC 4 months CPX

## 2024-05-18 NOTE — Research (Signed)
 Week 60Follow-Up Visit Completed*   []  Not Necessary, No Potential Adverse Events Or Medication Issues Reported On Completed Subject Questionnaire   []  Yes, Contact With Subject/Alternate Contact Completed   [x]  Yes, No Contact With Subject/Alternate Contact Completed, But Electronic Health Record Was Reviewed   []  No, Unable To Contact Subject/Alternate Contact   Have you reviewed Ongoing medications on the Targeted Concomitant Medication form and updated the form as needed?   [x]  Yes   []  No   Subject Status*   [x]  Continuing In Follow-up   []  At Risk For Lost To Follow-up   []  Withdrawal From All Future Study Activities Including Passive Follow-up By Electronic Health Record Review Or Contact With Healthcare Provider Or Family Member/Friend   []  Death   Vital Status*   [x]  Alive   []  Deceased   []  Unknown   Last Known To Be Alive Source*   []  Subject Completed Follow-up Questionnaire/Seen In Person/Via Telephone Contact   []  Family Member or Caretaker   [x]  Primary Physician Or Medical Records   []  Publicly Available Source   []  Other

## 2024-05-18 NOTE — Patient Instructions (Signed)
   Check the  blood pressure regularly Blood pressure goal:  between 110/65 and  135/85. If it is consistently higher or lower, let me know     Next office visit for a physical in 4 months  Please make an appointment before you leave today

## 2024-05-19 ENCOUNTER — Ambulatory Visit: Attending: Cardiovascular Disease | Admitting: Cardiovascular Disease

## 2024-05-19 ENCOUNTER — Encounter: Payer: Self-pay | Admitting: Cardiovascular Disease

## 2024-05-19 ENCOUNTER — Encounter: Payer: Self-pay | Admitting: *Deleted

## 2024-05-19 VITALS — BP 128/64 | HR 61 | Ht 72.0 in | Wt 256.4 lb

## 2024-05-19 DIAGNOSIS — I25810 Atherosclerosis of coronary artery bypass graft(s) without angina pectoris: Secondary | ICD-10-CM | POA: Diagnosis not present

## 2024-05-19 DIAGNOSIS — Z006 Encounter for examination for normal comparison and control in clinical research program: Secondary | ICD-10-CM

## 2024-05-19 DIAGNOSIS — E782 Mixed hyperlipidemia: Secondary | ICD-10-CM

## 2024-05-19 DIAGNOSIS — I5022 Chronic systolic (congestive) heart failure: Secondary | ICD-10-CM

## 2024-05-19 NOTE — Patient Instructions (Signed)

## 2024-08-10 ENCOUNTER — Telehealth: Payer: Self-pay

## 2024-08-10 DIAGNOSIS — E560 Deficiency of vitamin E: Secondary | ICD-10-CM | POA: Diagnosis not present

## 2024-08-10 DIAGNOSIS — K8681 Exocrine pancreatic insufficiency: Secondary | ICD-10-CM | POA: Diagnosis not present

## 2024-08-10 DIAGNOSIS — Z9049 Acquired absence of other specified parts of digestive tract: Secondary | ICD-10-CM | POA: Diagnosis not present

## 2024-08-10 DIAGNOSIS — K219 Gastro-esophageal reflux disease without esophagitis: Secondary | ICD-10-CM | POA: Diagnosis not present

## 2024-08-10 DIAGNOSIS — R197 Diarrhea, unspecified: Secondary | ICD-10-CM | POA: Diagnosis not present

## 2024-08-10 NOTE — Telephone Encounter (Signed)
 Advise patient to call few days before his appointment to get the blood work then.

## 2024-08-10 NOTE — Telephone Encounter (Signed)
 Copied from CRM [id number]. Topic: Appointments - Scheduling Inquiry for Clinic >> Aug 10, 2024 10:15 AM Vena HERO wrote: Reason for CRM: Pt called to reschedule appt due to dr amon being out of office. Appt scheduled for 11/23/24 but pt states he will need labs so he scheduled a lab appt for 11/16/24. Please put labs in for this pt and inform him so he will know its complete. Also added appts to waitlist so if something opens sooner he can reschedule.

## 2024-08-11 ENCOUNTER — Other Ambulatory Visit: Payer: Self-pay | Admitting: Cardiovascular Disease

## 2024-08-11 ENCOUNTER — Other Ambulatory Visit: Payer: Self-pay | Admitting: Internal Medicine

## 2024-08-18 NOTE — Research (Signed)
Week 72Follow-Up Visit Completed*   []  Not Necessary, No Potential Adverse Events Or Medication Issues Reported On Completed Subject Questionnaire   []  Yes, Contact With Subject/Alternate Contact Completed   [x]  Yes, No Contact With Subject/Alternate Contact Completed, But Electronic Health Record Was Reviewed   []  No, Unable To Contact Subject/Alternate Contact   Have you reviewed Ongoing medications on the Targeted Concomitant Medication form and updated the form as needed?   [x]  Yes   []  No   Subject Status*   [x]  Continuing In Follow-up   []  At Risk For Lost To Follow-up   []  Withdrawal From All Future Study Activities Including Passive Follow-up By Electronic Health Record Review Or Contact With Healthcare Provider Or Family Member/Friend   []  Death   Vital Status*   [x]  Alive   []  Deceased   []  Unknown   Last Known To Be Alive Source*   []  Subject Completed Follow-up Questionnaire/Seen In Person/Via Telephone Contact   []  Family Member or Caretaker   [x]  Primary Physician Or Medical Records   []  Publicly Available Source   []  Other

## 2024-08-24 DIAGNOSIS — L821 Other seborrheic keratosis: Secondary | ICD-10-CM | POA: Diagnosis not present

## 2024-08-24 DIAGNOSIS — D225 Melanocytic nevi of trunk: Secondary | ICD-10-CM | POA: Diagnosis not present

## 2024-08-24 DIAGNOSIS — D692 Other nonthrombocytopenic purpura: Secondary | ICD-10-CM | POA: Diagnosis not present

## 2024-08-24 DIAGNOSIS — D1801 Hemangioma of skin and subcutaneous tissue: Secondary | ICD-10-CM | POA: Diagnosis not present

## 2024-08-24 DIAGNOSIS — L570 Actinic keratosis: Secondary | ICD-10-CM | POA: Diagnosis not present

## 2024-08-24 DIAGNOSIS — Z85820 Personal history of malignant melanoma of skin: Secondary | ICD-10-CM | POA: Diagnosis not present

## 2024-08-27 ENCOUNTER — Encounter: Admitting: Internal Medicine

## 2024-09-21 ENCOUNTER — Encounter: Payer: Self-pay | Admitting: *Deleted

## 2024-09-21 ENCOUNTER — Ambulatory Visit: Payer: Medicare HMO | Admitting: *Deleted

## 2024-09-21 VITALS — Ht 72.0 in | Wt 255.0 lb

## 2024-09-21 DIAGNOSIS — Z0000 Encounter for general adult medical examination without abnormal findings: Secondary | ICD-10-CM | POA: Diagnosis not present

## 2024-09-21 DIAGNOSIS — Z006 Encounter for examination for normal comparison and control in clinical research program: Secondary | ICD-10-CM

## 2024-09-21 NOTE — Research (Signed)
 Week 84Follow-Up Visit Completed* Control patient   []  Not Necessary, No Potential Adverse Events Or Medication Issues Reported On Completed Subject Questionnaire   []  Yes, Contact With Subject/Alternate Contact Completed   [x]  Yes, No Contact With Subject/Alternate Contact Completed, But Electronic Health Record Was Reviewed   []  No, Unable To Contact Subject/Alternate Contact   Have you reviewed Ongoing medications on the Targeted Concomitant Medication form and updated the form as needed?   [x]  Yes   []  No   Subject Status*   [x]  Continuing In Follow-up   []  At Risk For Lost To Follow-up   []  Withdrawal From All Future Study Activities Including Passive Follow-up By Electronic Health Record Review Or Contact With Healthcare Provider Or Family Member/Friend   []  Death   Vital Status*   [x]  Alive   []  Deceased   []  Unknown   Last Known To Be Alive Source*   []  Subject Completed Follow-up Questionnaire/Seen In Person/Via Telephone Contact   []  Family Member or Caretaker   [x]  Primary Physician Or Medical Records   []  Publicly Available Source   []  Other

## 2024-09-21 NOTE — Patient Instructions (Signed)
 [name],  Thank you for taking the time for your Medicare Wellness Visit. I appreciate your continued commitment to your health goals. Please review the care plan we discussed, and feel free to reach out if I can assist you further.  Please note that Annual Wellness Visits do not include a physical exam. Some assessments may be limited, especially if the visit was conducted virtually. If needed, we may recommend an in-person follow-up with your provider.  GOALS:  To keep weight less than 250lb  Ongoing Care Seeing your primary care provider every 3 to 6 months helps us  monitor your health and provide consistent, personalized care.   Dr. Amon: 12/17/24 2:40pm Medicare AWV:  09/29/25 11am, telephone  Recommended Screenings:  Health Maintenance  Topic Date Due   COVID-19 Vaccine (8 - Pfizer risk 2025-26 season) 02/09/2025   Medicare Annual Wellness Visit  09/21/2025   Colon Cancer Screening  01/22/2028   DTaP/Tdap/Td vaccine (3 - Td or Tdap) 07/04/2030   Pneumococcal Vaccine for age over 44  Completed   Flu Shot  Completed   Meningitis B Vaccine  Aged Out   Hepatitis C Screening  Discontinued   Zoster (Shingles) Vaccine  Discontinued       09/14/2024    9:51 AM  Advanced Directives  Does Patient Have a Medical Advance Directive? Yes  Type of Estate Agent of [place];Living will  Does patient want to make changes to medical advance directive? Yes (MAU/Ambulatory/Procedural Areas - Information given)  Copy of Healthcare Power of Attorney in Chart? No - copy requested  Please let me know if you do not receive your Advanced Directive Packet within 1 week. Once completed and notarized, you may return a copy of your Advanced Directive(s) by either of the following:  Bring a copy of your health care power of attorney and living will to the office to be added to your chart at your convenience. You can mail a copy to [facility] [address]  [place], [place] [address] or email to [email or web address]    Vision: Annual vision screenings are recommended for early detection of glaucoma, cataracts, and diabetic retinopathy. These exams can also reveal signs of chronic conditions such as diabetes and high blood pressure.  Dental: Annual dental screenings help detect early signs of oral cancer, gum disease, and other conditions linked to overall health, including heart disease and diabetes.  Please see the attached documents for additional preventive care recommendations.

## 2024-09-21 NOTE — Progress Notes (Signed)
 Please attest this visit in the absence of patient primary care provider.   Chief Complaint  Patient presents with   Medicare Wellness     Subjective:   Steven Munoz is a 83 y.o. male who presents for a Medicare Annual Wellness Visit.  Allergies (verified) Povidone-iodine, Ofloxacin , and Tape   History: Past Medical History:  Diagnosis Date   Allergy 2004   Tape,   Arthritis    CAD (coronary artery disease) CABG  05/29/09    OPERATIVE PROCEDURES:  Median sternotomy, extracorporeal circulation,    Cataract    Colonic polyp 2002, 2005, 2009   Dr Luis   Dermatophytosis of nail    Diverticulosis    Dysmetabolic syndrome    GERD (gastroesophageal reflux disease)    S/P dilation  2005   Gout    Hyperlipidemia    Hypertension    Hypothyroidism    Melanoma (HCC) 08/22/2023   Murmur    Nephrolithiasis    X 1   Peripheral neuropathy 03/20/2021   Pneumonia age 43   treated as inpatient   Sleep apnea    on CPAP; Dr Jude   Past Surgical History:  Procedure Laterality Date   athroscopy     right knee X2, left knee X1   CARDIAC CATHETERIZATION     CATARACT EXTRACTION Bilateral 2022   CHOLECYSTECTOMY N/A 03/23/2024   Procedure: LAPAROSCOPIC CHOLECYSTECTOMY;  Surgeon: Teresa Lonni HERO, MD;  Location: [facility] OR;  Service: General;  Laterality: N/A;  WITH ICG DYE   COLONOSCOPY  2002 & 2009 &2014   polypectomy; Dr Luis   CORONARY ARTERY BYPASS GRAFT  2010   Median sternotomy, extracorporeal circulation, coronary bypass graft surgery x3 using a left internal mammary artery graft to left anterior descending coronary artery, with a sequential  saphenous vein graft to posterior descending and posterolateral branches  of the right coronary artery.  Endoscopic vein harvesting from the right   EYE SURGERY Right    tear duct   JOINT REPLACEMENT  Right knee 2004, right hip 2015   LEFT HEART CATH AND CORONARY ANGIOGRAPHY N/A 01/26/2023   Procedure: Coronary/Graft Acute MI  Revascularization;  Surgeon: Anner Alm ORN, MD;  Location: [facility] INVASIVE CV LAB;  Service: Cardiovascular;  Laterality: N/A;   Nail Alvusion Bilateral    Hallux   NOSE SURGERY  11/20/2012   L max antrectomy;septoplasty;turbinate reduction. Dr Deloras Sina CHILD   REPLACEMENT TOTAL KNEE     right 2003   REPLACEMENT TOTAL KNEE     partial Left March 2009   TENDON RELEASE Right 03/2016   from R hip. @ [name]   TOTAL HIP ARTHROPLASTY  02/22/2014   right hip; Dr [facility]   Family History  Problem Relation Age of Onset   Hypertension Mother    Transient ischemic attack Mother    Lung cancer Mother        liver cancer, smoker, TIA   Liver cancer Mother    Heart attack Father 37   Heart attack Brother 56       smoker   Diabetes Neg Hx    Colon cancer Neg Hx    Prostate cancer Neg Hx    Social History   Occupational History   Occupation: retired- Theatre Stage Manager: [employer]  Tobacco Use   Smoking status: Former    Current packs/day: 0.00    Average packs/day: 1 pack/day for 6.0 years (6.0 ttl pk-yrs)    Types:  Cigarettes    Start date: 10/28/1960    Quit date: 10/28/1966    Years since quitting: 57.9   Smokeless tobacco: Never   Tobacco comments:    smoked 1962- 1968, up to < 1 ppd  Vaping Use   Vaping status: Never Used  Substance and Sexual Activity   Alcohol use: Not Currently   Drug use: No   Sexual activity: Yes   Tobacco Counseling Counseling given: Not Answered Tobacco comments: smoked 1962- 1968, up to < 1 ppd  SDOH Screenings   Food Insecurity: No Food Insecurity (09/21/2024)  Housing: Low Risk  (09/21/2024)  Transportation Needs: No Transportation Needs (09/21/2024)  Utilities: Not At Risk (09/21/2024)  Alcohol Screen: Low Risk  (02/10/2024)  Depression (PHQ2-9): Low Risk  (09/21/2024)  Financial Resource Strain: Low Risk  (05/11/2024)  Physical Activity: Sufficiently Active (09/21/2024)  Social Connections: Moderately Integrated  (09/21/2024)  Stress: No Stress Concern Present (09/21/2024)  Tobacco Use: Medium Risk (09/21/2024)  Health Literacy: Adequate Health Literacy (09/11/2023)   See flowsheets for full screening details  Depression Screen PHQ 2 & 9 Depression Scale- Over the past 2 weeks, how often have you been bothered by any of the following problems? Little interest or pleasure in doing things: 0 Feeling down, depressed, or hopeless (PHQ Adolescent also includes...irritable): 0 PHQ-2 Total Score: 0 Trouble falling or staying asleep, or sleeping too much: 1 (stays up late watching tv, wakes up 2 x a night for bathroom breaks) Feeling tired or having little energy: 0 Poor appetite or overeating (PHQ Adolescent also includes...weight loss): 0 Feeling bad about yourself - or that you are a failure or have let yourself or your family down: 0 Trouble concentrating on things, such as reading the newspaper or watching television (PHQ Adolescent also includes...like school work): 0 Moving or speaking so slowly that other people could have noticed. Or the opposite - being so fidgety or restless that you have been moving around a lot more than usual: 0 Thoughts that you would be better off dead, or of hurting yourself in some way: 0 PHQ-9 Total Score: 1 If you checked off any problems, how difficult have these problems made it for you to do your work, take care of things at home, or get along with other people?: Not difficult at all  Depression Treatment Depression Interventions/Treatment : EYV7-0 Score <4 Follow-up Not Indicated     Goals Addressed             This Visit's Progress    Patient Stated   Not on track    Lose some weight       Visit info / Clinical Intake: Medicare Wellness Visit Type:: Subsequent Annual Wellness Visit Persons participating in visit:: patient Medicare Wellness Visit Mode:: Telephone If telephone:: video declined Because this visit was a virtual/telehealth visit:: pt  reported vitals If Telephone or Video please confirm:: I connected with the patient using audio enabled telemedicine application and verified that I am speaking with the correct person using two identifiers; I discussed the limitations of evaluation and management by telemedicine; The patient expressed understanding and agreed to proceed Patient Location:: home Provider Location:: office Information given by:: patient Interpreter Needed?: No Pre-visit prep was completed: yes AWV questionnaire completed by patient prior to visit?: yes Date:: 09/14/24 Living arrangements:: lives with spouse/significant other Patient's Overall Health Status Rating: very good Typical amount of pain: some (has intermittent low back pain) Does pain affect daily life?: (!) yes (when pain intensifies has to  sit for small periods of time) Are you currently prescribed opioids?: no  Dietary Habits and Nutritional Risks How many meals a day?: 3 Eats fruit and vegetables daily?: yes Most meals are obtained by: preparing own meals In the last 2 weeks, have you had any of the following?: none Diabetic:: no  Functional Status Activities of Daily Living (to include ambulation/medication): (Patient-Rptd) Independent Ambulation: (Patient-Rptd) Independent Medication Administration: Independent Home Management: (Patient-Rptd) Independent Manage your own finances?: yes Primary transportation is: driving Concerns about vision?: no *vision screening is required for [facility]* (up to date with Dr Waylan and scheduled for December) Concerns about hearing?: (!) yes Uses hearing aids?: (!) yes  Fall Screening Falls in the past year?: 0 Number of falls in past year: 0 Was there an injury with Fall?: 0 Fall Risk Category Calculator: 0 Patient Fall Risk Level: Low Fall Risk  Fall Risk Patient at Risk for Falls Due to: Orthopedic patient Fall risk Follow up: Falls evaluation completed  Home and Transportation Safety: All rugs  have non-skid backing?: yes All stairs or steps have railings?: yes Grab bars in the bathtub or shower?: yes Have non-skid surface in bathtub or shower?: yes (1 bathroom is equipped) Good home lighting?: yes Regular seat belt use?: yes Hospital stays in the last year:: (!) yes How many hospital stays:: 1 Reason: gallbladder removal  Cognitive Assessment Difficulty concentrating, remembering, or making decisions? : no Will 6CIT or Mini Cog be Completed: yes What year is it?: 0 points What month is it?: 0 points Give patient an address phrase to remember (5 components): [address] [place]  About what time is it?: 0 points Count backwards from 20 to 1: 0 points Say the months of the year in reverse: 0 points Repeat the address phrase from earlier: 0 points 6 CIT Score: 0 points  Advance Directives (For Healthcare) Does Patient Have a Medical Advance Directive?: Yes Does patient want to make changes to medical advance directive?: Yes (MAU/Ambulatory/Procedural Areas - Information given) Type of Advance Directive: Healthcare Power of [place]; Living will Copy of Healthcare Power of Attorney in Chart?: No - copy requested Copy of Living Will in Chart?: No - copy requested  Reviewed/Updated  Reviewed/Updated: Reviewed All (Medical, Surgical, Family, Medications, Allergies, Care Teams, Patient Goals)        Objective:    Today's Vitals   09/21/24 1100  Weight: 255 lb (115.7 kg)  Height: 6' (1.829 m)   Body mass index is 34.58 kg/m.  Current Medications (verified) Outpatient Encounter Medications as of 09/21/2024  Medication Sig   acetaminophen  (TYLENOL ) 500 MG tablet Take 2 tablets (1,000 mg total) by mouth every 6 (six) hours as needed.   allopurinol  (ZYLOPRIM ) 100 MG tablet Take 1 tablet (100 mg total) by mouth daily.   aspirin  EC 81 MG tablet Take 81 mg by mouth daily.   clopidogrel  (PLAVIX ) 75 MG tablet TAKE 1 TABLET BY MOUTH DAILY WITH BREAKFAST.   Coenzyme  Q10 (COQ10 PO) Take 1 capsule by mouth daily.   colestipol  (COLESTID ) 1 g tablet Take 1 g by mouth daily. (Patient taking differently: Take 1 g by mouth daily. Takes 1-2 daily)   Cyanocobalamin  (VITAMIN B-12 PO) Take 1 tablet by mouth daily.   dapagliflozin  propanediol (FARXIGA ) 10 MG TABS tablet Take 1 tablet (10 mg total) by mouth daily.   famotidine  (PEPCID ) 40 MG tablet Take 40 mg by mouth at bedtime.   furosemide  (LASIX ) 20 MG tablet Take 1 tablet (20 mg total)  by mouth daily as needed.   hydrALAZINE  (APRESOLINE ) 10 MG tablet Take 1 tablet (10 mg total) by mouth 2 (two) times daily as needed. Take one tablet as needed for systolic BP >150   levothyroxine  (SYNTHROID ) 125 MCG tablet Take 1 tablet (125 mcg total) by mouth daily before breakfast.   magnesium  oxide (MAG-OX) 400 (240 Mg) MG tablet Take 400 mg by mouth at bedtime.   metoprolol  succinate (TOPROL -XL) 25 MG 24 hr tablet TAKE 1 TABLET (25 MG TOTAL) BY MOUTH DAILY.   Multiple Vitamins-Minerals (PRESERVISION AREDS 2+MULTI VIT) CAPS Take 1 capsule by mouth 2 (two) times daily.    nitroGLYCERIN  (NITROSTAT ) 0.4 MG SL tablet PLACE 1 TABLET UNDER THE TONGUE EVERY 5 MINUTES AS NEEDED.   Omega-3 Fatty Acids (FISH OIL PO) Take 1 capsule by mouth daily.   rosuvastatin  (CRESTOR ) 40 MG tablet Take 1 tablet (40 mg total) by mouth daily.   sacubitril -valsartan  (ENTRESTO ) 49-51 MG Take 1 tablet by mouth 2 (two) times daily.   spironolactone  (ALDACTONE ) 25 MG tablet Take 0.5 tablets (12.5 mg total) by mouth daily.   No facility-administered encounter medications on file as of 09/21/2024.   Hearing/Vision screen No results found. Immunizations and Health Maintenance Health Maintenance  Topic Date Due   COVID-19 Vaccine (8 - Pfizer risk 2025-26 season) 02/09/2025   Medicare Annual Wellness (AWV)  09/21/2025   Colonoscopy  01/22/2028   DTaP/Tdap/Td (3 - Td or Tdap) 07/04/2030   Pneumococcal Vaccine: 50+ Years  Completed   Influenza Vaccine   Completed   Meningococcal B Vaccine  Aged Out   Hepatitis C Screening  Discontinued   Zoster Vaccines- Shingrix   Discontinued        Assessment/Plan:  This is a routine wellness examination for Elsie.  Patient Care Team: Amon Aloysius BRAVO, MD as PCP - General (Internal Medicine) Delford Maude BROCKS, MD as PCP - Cardiology (Cardiology) Hallows, Dene POUR, MD as Consulting Physician (Orthopedic Surgery) Jude Harden GAILS, MD as Consulting Physician (Pulmonary Disease) Luis Purchase, MD as Consulting Physician (Gastroenterology) Chalmer Boom, DC as Referring Physician (Chiropractic Medicine) Carla Milling, RPH-CPP (Pharmacist) Heide Ingle, MD as Consulting Physician (Orthopedic Surgery) Waylan Cain, MD as Consulting Physician (Ophthalmology)  I have personally reviewed and noted the following in the patient's chart:   Medical and social history Use of alcohol, tobacco or illicit drugs  Current medications and supplements including opioid prescriptions. Functional ability and status Nutritional status Physical activity Advanced directives List of other physicians Hospitalizations, surgeries, and ER visits in previous 12 months Vitals Screenings to include cognitive, depression, and falls Referrals and appointments  No orders of the defined types were placed in this encounter.  In addition, I have reviewed and discussed with patient certain preventive protocols, quality metrics, and best practice recommendations. A written personalized care plan for preventive services as well as general preventive health recommendations were provided to patient.   Lolita Libra, CMA   09/21/2024   Return in 1 year (on 09/21/2025).  After Visit Summary: (MyChart) Due to this being a telephonic visit, the after visit summary with patients personalized plan was offered to patient via MyChart   Nurse Notes: nothing significant to report

## 2024-09-22 ENCOUNTER — Encounter: Admitting: Internal Medicine

## 2024-09-27 ENCOUNTER — Other Ambulatory Visit: Payer: Self-pay | Admitting: Cardiovascular Disease

## 2024-10-14 DIAGNOSIS — Z961 Presence of intraocular lens: Secondary | ICD-10-CM | POA: Diagnosis not present

## 2024-10-14 DIAGNOSIS — H524 Presbyopia: Secondary | ICD-10-CM | POA: Diagnosis not present

## 2024-10-27 ENCOUNTER — Other Ambulatory Visit: Payer: Self-pay | Admitting: Cardiovascular Disease

## 2024-11-03 NOTE — Progress Notes (Signed)
 Patient ID: Steven Munoz, male   DOB: [birth year], 84 y.o.   MRN: [id number]     84 y.o. CABG August 2010 He had ostial LAD, distal RCA/PDA disease and had LIMA to LAD SVG Sequential to PDA and PLB EF normal circumflex not grafted. Dr Jude follow him for OSA and CPAP Activity limited by right hip pain post surgery at [facility] and f/u tendon release. Dr Chrys Hails.  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    Seen in ED 02/06/23 for elevated BP and atypical chest pain. R/O ARB changed to entresto  and PRN hydralazine  called in   Echo  12/08/17 AV sclerosis severe LAE 57 mm EF normal reviewd Echo 06/18/22 EF 60-65% mild AS mean gradient 10 peak 18.7 mmHg DVI 0.43 AVA 1.54 cm2  Echo 01/26/23  EF 40-45% RV normal Moderate LAE trivial MR AV sclerosis  Blocked tear ducts Has had multiple surgeries but left one still blocked Has had vertigo and peripheral neuropathy R xwith neurontin    Given neuropathy in feet no longer takes his Healy/Porshe to VIR Is going to gym 3x/week   He has a son Rozelle who is dentist in [place] with two grand sons   Had some dyspnea when I saw him in April  2024 and BNP mildly elevated improved with addition of aldactone   Weight down 40 lbs had some [place]s surgery on right side of face. Compliant with meds   Had lap cholecystectomy 03/23/24.   No cardiac complaints   ROS: Denies fever, malais, weight loss, blurry vision, decreased visual acuity, cough, sputum, SOB, hemoptysis, pleuritic pain, palpitaitons, heartburn, abdominal pain, melena, lower extremity edema, claudication, or rash.  All other systems reviewed and negative  General: BP 130/65 (BP Location: Right Arm)   Pulse (!) 54   Ht 6' (1.829 m)   Wt 265 lb  (120.2 kg)   SpO2 94%   BMI 35.94 kg/m   BP by me equal in both arms 125/55 bilateral  Affect appropriate Overweight white male  RLE varicosities HEENT: Tinnitus  Neck supple with no adenopathy JVP normal no bruits no thyromegaly Lungs clear with no wheezing and good diaphragmatic motion Heart:  S1/S2 SEM  murmur, no rub, gallop or click PMI normal Abdomen: benighn, BS positve, no tenderness, no AAA no bruit.  No HSM or HJR post lap cholecystectomy Distal pulses intact with no bruits Peripheral neuropathy in feet  Skin warm and dry Right TKR with RLE varicosities and mild edema    Current Outpatient Medications  Medication Sig Dispense Refill   acetaminophen  (TYLENOL ) 500 MG tablet Take 2 tablets (1,000 mg total) by mouth every 6 (six) hours as needed.     allopurinol  (ZYLOPRIM ) 100 MG tablet Take 1 tablet (100 mg total) by mouth daily. 90 tablet 3   aspirin  EC 81 MG tablet Take 81 mg by mouth daily.     clopidogrel  (PLAVIX ) 75 MG tablet TAKE 1 TABLET BY MOUTH DAILY WITH BREAKFAST. 90 tablet 3   Coenzyme Q10 (COQ10 PO) Take 1 capsule by mouth daily.     colestipol  (COLESTID ) 1 g tablet Take 1 g by mouth daily. (Patient taking differently: Take 1 g by mouth daily. Takes 1-2 daily)     Cyanocobalamin  (  VITAMIN B-12 PO) Take 1 tablet by mouth daily.     dapagliflozin  propanediol (FARXIGA ) 10 MG TABS tablet Take 1 tablet (10 mg total) by mouth daily. 90 tablet 3   famotidine  (PEPCID ) 40 MG tablet Take 40 mg by mouth at bedtime.     furosemide  (LASIX ) 20 MG tablet Take 1 tablet (20 mg total) by mouth daily as needed. 30 tablet 1   hydrALAZINE  (APRESOLINE ) 10 MG tablet Take 1 tablet (10 mg total) by mouth 2 (two) times daily as needed. Take one tablet as needed for systolic BP >150 60 tablet 0   levothyroxine  (SYNTHROID ) 125 MCG tablet Take 1 tablet (125 mcg total) by mouth daily before breakfast. 90 tablet 1   magnesium  oxide (MAG-OX) 400 (240 Mg) MG tablet Take 400 mg by mouth at  bedtime.     metoprolol  succinate (TOPROL -XL) 25 MG 24 hr tablet TAKE 1 TABLET (25 MG TOTAL) BY MOUTH DAILY. 90 tablet 3   Multiple Vitamins-Minerals (PRESERVISION AREDS 2+MULTI VIT) CAPS Take 1 capsule by mouth 2 (two) times daily.      nitroGLYCERIN  (NITROSTAT ) 0.4 MG SL tablet PLACE 1 TABLET UNDER THE TONGUE EVERY 5 MINUTES AS NEEDED. 75 tablet 1   Omega-3 Fatty Acids (FISH OIL PO) Take 1 capsule by mouth daily.     rosuvastatin  (CRESTOR ) 40 MG tablet TAKE 1 TABLET BY MOUTH EVERY DAY 90 tablet 1   sacubitril -valsartan  (ENTRESTO ) 49-51 MG TAKE 1 TABLET BY MOUTH TWICE A DAY 180 tablet 3   spironolactone  (ALDACTONE ) 25 MG tablet Take 0.5 tablets (12.5 mg total) by mouth daily. 45 tablet 3   No current facility-administered medications for this visit.    Allergies  Povidone-iodine, Ofloxacin , and Tape  Electrocardiogram:  08/14/20 SR rate 53 normal 11/04/2024 NSR rate 62 PR 222 msec stable 11/04/2024 SR rate 52 PR 216 msec   Assessment and Plan  Murmur: AV sclerosis on 01/26/23     CAD/CABG: 2010 [place] to LAD and SVG to PDA/PLB  SEMI 01/2023 with thrombotic lesion to distal anastomosis of PDA graft Rx medically Complicated by large retroperitoneal bleed No vessel extravasation on CT 01/29/23  Hct stable 02/06/23 35.4 Continue ASA/Plavix  given thrombotic lesion  EF reduced 40-45% post MI Ischemic DCM Rx with aldactone , entresto , toprol  , Farxiga  and lasix   PAF: post op no recurrence ASA and beta blocker LA severely dilated on 58 mm on TTE  Puts him at increased risk of recurrence  Cautious with DOAC as he has had large retroperitoneal hematoma in past   HTN: ARB changed to Entresto  02/06/23 with PRN hydralazine   improved .    Thyroid :  Lab Results  Component Value Date   TSH 1.47 02/17/2024    Cholesterol  Lab Results  Component Value Date   LDLCALC 56 05/10/2024    Ortho: post right hip surgery [name]  Post tendon release in June 2018  with relief of pain  First Degree: no change on ECG   no high grade AV block PR 216 msec  ENT:  F/u [name] bells palsy resolved still with tinnitus wearing hearing aids post tear duct surgery   Dyspnea:  improved with aldactone  K 4.3 Cr 0.77 BUN 21 labs 03/31/24    F/U in 6 months     Steven Munoz

## 2024-11-04 ENCOUNTER — Ambulatory Visit: Attending: Cardiovascular Disease | Admitting: Cardiovascular Disease

## 2024-11-04 VITALS — BP 130/65 | HR 54 | Ht 72.0 in | Wt 265.0 lb

## 2024-11-04 DIAGNOSIS — I5020 Unspecified systolic (congestive) heart failure: Secondary | ICD-10-CM | POA: Diagnosis not present

## 2024-11-04 DIAGNOSIS — I10 Essential (primary) hypertension: Secondary | ICD-10-CM | POA: Diagnosis not present

## 2024-11-04 DIAGNOSIS — I25810 Atherosclerosis of coronary artery bypass graft(s) without angina pectoris: Secondary | ICD-10-CM

## 2024-11-04 DIAGNOSIS — I493 Ventricular premature depolarization: Secondary | ICD-10-CM | POA: Diagnosis not present

## 2024-11-04 DIAGNOSIS — R011 Cardiac murmur, unspecified: Secondary | ICD-10-CM | POA: Diagnosis not present

## 2024-11-04 DIAGNOSIS — E782 Mixed hyperlipidemia: Secondary | ICD-10-CM

## 2024-11-04 NOTE — Patient Instructions (Signed)
 Medication Instructions:  No Changes *If you need a refill on your cardiac medications before your next appointment, please call your pharmacy*  Lab Work: None  Follow-Up: At [facility], you and your health needs are our priority.  As part of our continuing mission to provide you with exceptional heart care, our providers are all part of one team.  This team includes your primary Cardiologist (physician) and Advanced Practice Providers or APPs (Physician Assistants and Nurse Practitioners) who all work together to provide you with the care you need, when you need it.  Your next appointment:   12 month(s)  Provider:   Maude Emmer, MD

## 2024-11-09 ENCOUNTER — Ambulatory Visit (INDEPENDENT_AMBULATORY_CARE_PROVIDER_SITE_OTHER): Admitting: Internal Medicine

## 2024-11-09 ENCOUNTER — Encounter: Payer: Self-pay | Admitting: Internal Medicine

## 2024-11-09 ENCOUNTER — Ambulatory Visit (HOSPITAL_BASED_OUTPATIENT_CLINIC_OR_DEPARTMENT_OTHER)
Admission: RE | Admit: 2024-11-09 | Discharge: 2024-11-09 | Disposition: A | Discharge status: Home / Self Care | Source: Ambulatory Visit | Attending: Internal Medicine | Admitting: Internal Medicine

## 2024-11-09 VITALS — BP 138/72 | HR 62 | Temp 98.1°F | Resp 18 | Ht 72.0 in | Wt 263.2 lb

## 2024-11-09 DIAGNOSIS — R81 Glycosuria: Secondary | ICD-10-CM

## 2024-11-09 DIAGNOSIS — Z85820 Personal history of malignant melanoma of skin: Secondary | ICD-10-CM

## 2024-11-09 DIAGNOSIS — E039 Hypothyroidism, unspecified: Secondary | ICD-10-CM | POA: Diagnosis not present

## 2024-11-09 DIAGNOSIS — Z0001 Encounter for general adult medical examination with abnormal findings: Secondary | ICD-10-CM | POA: Diagnosis not present

## 2024-11-09 DIAGNOSIS — R399 Unspecified symptoms and signs involving the genitourinary system: Secondary | ICD-10-CM

## 2024-11-09 DIAGNOSIS — I5020 Unspecified systolic (congestive) heart failure: Secondary | ICD-10-CM

## 2024-11-09 DIAGNOSIS — R0789 Other chest pain: Secondary | ICD-10-CM | POA: Insufficient documentation

## 2024-11-09 DIAGNOSIS — I25810 Atherosclerosis of coronary artery bypass graft(s) without angina pectoris: Secondary | ICD-10-CM

## 2024-11-09 DIAGNOSIS — I10 Essential (primary) hypertension: Secondary | ICD-10-CM | POA: Diagnosis not present

## 2024-11-09 DIAGNOSIS — Z8739 Personal history of other diseases of the musculoskeletal system and connective tissue: Secondary | ICD-10-CM | POA: Diagnosis not present

## 2024-11-09 DIAGNOSIS — Z0000 Encounter for general adult medical examination without abnormal findings: Secondary | ICD-10-CM

## 2024-11-09 DIAGNOSIS — R739 Hyperglycemia, unspecified: Secondary | ICD-10-CM | POA: Diagnosis not present

## 2024-11-09 LAB — POC URINALSYSI DIPSTICK (AUTOMATED)
Bilirubin, UA: NEGATIVE
Blood, UA: NEGATIVE
Glucose, UA: POSITIVE — AB
Ketones, UA: NEGATIVE
Leukocytes, UA: NEGATIVE
Nitrite, UA: NEGATIVE
Protein, UA: NEGATIVE
Spec Grav, UA: 1.01
Urobilinogen, UA: 0.2 U/dL
pH, UA: 5

## 2024-11-09 NOTE — Patient Instructions (Signed)
 Please read your instructions carefully.   GO TO THE LAB :  Get the blood work    Go to the front desk for the checkout Please make an appointment for a checkup in 3 months   STOP BY THE FIRST FLOOR:  get the XR    If you have ongoing urinary symptoms let us  know.  Particularly if you have fever chills or blood in the urine.

## 2024-11-09 NOTE — Progress Notes (Unsigned)
 "  Subjective:    Patient ID: Steven Munoz, male    DOB: 1941-04-06, 84 y.o.   MRN: [id number]  DOS:  11/09/2024 CPX  CPX Chronic medical problems addressed. Also for few weeks is having the pain on the left anterior chest, only when he moves or stretch his arms.  There is no TTP.  No rash.  No injury although he goes to the gym and do some weight lifting.  2 weeks ago developed LUTS: + Urgency and frequency.  No dysuria or gross hematuria.  No fever or chills. Symptoms are improved 90% spontaneously.   Review of Systems See above   Past Medical History:  Diagnosis Date   Allergy 2004   Tape,   Arthritis    CAD (coronary artery disease) CABG  05/29/09    OPERATIVE PROCEDURES:  Median sternotomy, extracorporeal circulation,    Cataract    Colonic polyp 2002, 2005, 2009   Dr Luis   Dermatophytosis of nail    Diverticulosis    Dysmetabolic syndrome    GERD (gastroesophageal reflux disease)    S/P dilation  2005   Gout    Hyperlipidemia    Hypertension    Hypothyroidism    Melanoma (HCC) 08/22/2023   Murmur    Nephrolithiasis    X 1   Peripheral neuropathy 03/20/2021   Pneumonia age 77   treated as inpatient   Sleep apnea    on CPAP; Dr Jude    Past Surgical History:  Procedure Laterality Date   athroscopy     right knee X2, left knee X1   CARDIAC CATHETERIZATION     CATARACT EXTRACTION Bilateral 2022   CHOLECYSTECTOMY N/A 03/23/2024   Procedure: LAPAROSCOPIC CHOLECYSTECTOMY;  Surgeon: Teresa Lonni HERO, MD;  Location: [facility] OR;  Service: General;  Laterality: N/A;  WITH ICG DYE   COLONOSCOPY  2002 & 2009 &2014   polypectomy; Dr Luis   CORONARY ARTERY BYPASS GRAFT  2010   Median sternotomy, extracorporeal circulation, coronary bypass graft surgery x3 using a left internal mammary artery graft to left anterior descending coronary artery, with a sequential  saphenous vein graft to posterior descending and posterolateral branches  of the right coronary artery.   Endoscopic vein harvesting from the right   EYE SURGERY Right    tear duct   JOINT REPLACEMENT  Right knee 2004, right hip 2015   LEFT HEART CATH AND CORONARY ANGIOGRAPHY N/A 01/26/2023   Procedure: Coronary/Graft Acute MI Revascularization;  Surgeon: Anner Alm ORN, MD;  Location: [facility] INVASIVE CV LAB;  Service: Cardiovascular;  Laterality: N/A;   Nail Alvusion Bilateral    Hallux   NOSE SURGERY  11/20/2012   L max antrectomy;septoplasty;turbinate reduction. Dr Deloras Sina CHILD   REPLACEMENT TOTAL KNEE     right 2003   REPLACEMENT TOTAL KNEE     partial Left March 2009   TENDON RELEASE Right 03/2016   from R hip. @ [name]   TOTAL HIP ARTHROPLASTY  02/22/2014   right hip; Dr [facility]    Current Outpatient Medications  Medication Instructions   acetaminophen  (TYLENOL ) 1,000 mg, Oral, Every 6 hours PRN   allopurinol  (ZYLOPRIM ) 100 mg, Oral, Daily   aspirin  EC 81 mg, Daily   Cholecalciferol (VITAMIN D3 PO) Take by mouth.   clopidogrel  (PLAVIX ) 75 mg, Oral, Daily with breakfast   Coenzyme Q10 (COQ10 PO) 1 capsule, Daily   colestipol  (COLESTID ) 1 g, Daily   Cyanocobalamin  (VITAMIN B-12 PO) 1 tablet, Daily   dapagliflozin   propanediol (FARXIGA ) 10 mg, Oral, Daily   famotidine  (PEPCID ) 40 mg, Daily at bedtime   furosemide  (LASIX ) 20 mg, Oral, Daily PRN   hydrALAZINE  (APRESOLINE ) 10 mg, Oral, 2 times daily PRN, Take one tablet as needed for systolic BP >150   levothyroxine  (SYNTHROID ) 125 mcg, Oral, Daily before breakfast   magnesium  oxide (MAG-OX) 400 mg, Daily at bedtime   metoprolol  succinate (TOPROL -XL) 25 mg, Oral, Daily   Multiple Vitamins-Minerals (PRESERVISION AREDS 2+MULTI VIT) CAPS 1 capsule, 2 times daily   nitroGLYCERIN  (NITROSTAT ) 0.4 MG SL tablet PLACE 1 TABLET UNDER THE TONGUE EVERY 5 MINUTES AS NEEDED.   Omega-3 Fatty Acids (FISH OIL PO) 1 capsule, Daily   rosuvastatin  (CRESTOR ) 40 mg, Oral, Daily   sacubitril -valsartan  (ENTRESTO ) 49-51 MG 1 tablet, Oral, 2 times  daily   spironolactone  (ALDACTONE ) 12.5 mg, Oral, Daily       Objective:   Physical Exam BP 138/72   Pulse 62   Temp 98.1 F (36.7 C) (Oral)   Resp 18   Ht 6' (1.829 m)   Wt 263 lb 4 oz (119.4 kg)   SpO2 97%   BMI 35.70 kg/m  General: Well developed, NAD, BMI noted Neck: No  thyromegaly  HEENT:  Normocephalic . Face symmetric, atraumatic Lungs:  CTA B Normal respiratory effort, no intercostal retractions, no accessory muscle use. Heart: RRR,  no murmur.  Abdomen:  Not distended, soft, non-tender. No rebound or rigidity.   Lower extremities: Trace pretibial edema bilaterally  Skin: Exposed areas without rash. Not pale. Not jaundice Neurologic:  alert & oriented X3.  Speech normal, gait appropriate for age and unassisted Strength symmetric and appropriate for age.  Psych: Cognition and judgment appear intact.  Cooperative with normal attention span and concentration.  Behavior appropriate. No anxious or depressed appearing.     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  No clear evidence of radiculopathy. 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  Dr. 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  CT A/P 03/22/2024 while admitted to hospital: Normal GU system   PLAN: Here for CPX - Td 2021 -pnm 23- 2012;  prevnar: 2016; PNM 20: 2023 -  zostavax 05-2016 , s/p shingrix , s/p RSV - Had a flu and COVID-vaccine  recently.  -CCS: multiple cscopes,  10-2012 Dr Vinton; cscope again 08/2019, + polyps, Cscope 01/22/2023 : Cecal polyp, tubular adenoma (see Care Everywhere)   - prostate ca screening: aged out  -Labs :BMP AST ALT CBC PSA UA urine culture uric acid   - diet/exercise: Doing great, exercises 3  times a week, does some lifting.  Other issues: HTN: BP looks good, recommend to check at home, continue Lasix , hydralazine  as needed, metoprolol , Entresto , Aldactone .  Checking a BMP Hypothyroidism: On Synthroid , check TSH. CAD, heart failure: On Plavix , Farxiga , Lasix , hydralazine  as needed, metoprolol .  Also Entresto  and Aldactone .  Has nitroglycerin , has not used it, recommend to refresh it regularly Chest wall pain: Check a chest x-ray LUTS: For the  last 2 weeks as described above.  Has a history of gross hematuria with negative workup last year.  Symptoms are spontaneously subsiding. Check a UA urine culture and PSA.  Further advised with results. Hyperglycemia: Check A1c High cholesterol: Last LDL 56, continue allopurinol   RTC 3 months   7 Hyperglycemia: Last A1c 5.5.  Diet controlled. HTN: BP today satisfactory, reports good ambulatory BPs.  Continue same medicines. Hyperlipidemia: Last LDL 56.  No change Cardiovascular: On multiple medicines, no volume overload, no cardiac symptoms.  Continue same medications. Cholecystitis: Since last visit, LFTs normalized Gross hematuria: Now asymptomatic.  Had a cystoscopy, urology planned a CT however he ended up in the hospital.  CT A/P while admitted showed a normal GU system. RTC 4 months CPX     "

## 2024-11-10 ENCOUNTER — Encounter: Payer: Self-pay | Admitting: Internal Medicine

## 2024-11-10 DIAGNOSIS — I2510 Atherosclerotic heart disease of native coronary artery without angina pectoris: Secondary | ICD-10-CM | POA: Insufficient documentation

## 2024-11-10 LAB — CBC WITH DIFFERENTIAL/PLATELET
Basophils Absolute: 0.1 K/uL (ref 0.0–0.1)
Basophils Relative: 1 % (ref 0.0–3.0)
Eosinophils Absolute: 0.1 K/uL (ref 0.0–0.7)
Eosinophils Relative: 2.3 % (ref 0.0–5.0)
HCT: 48.3 % (ref 39.0–52.0)
Hemoglobin: 16.2 g/dL (ref 13.0–17.0)
Lymphocytes Relative: 32.1 % (ref 12.0–46.0)
Lymphs Abs: 2 K/uL (ref 0.7–4.0)
MCHC: 33.6 g/dL (ref 30.0–36.0)
MCV: 96.1 fl (ref 78.0–100.0)
Monocytes Absolute: 0.5 K/uL (ref 0.1–1.0)
Monocytes Relative: 7.6 % (ref 3.0–12.0)
Neutro Abs: 3.5 K/uL (ref 1.4–7.7)
Neutrophils Relative %: 57 % (ref 43.0–77.0)
Platelets: 143 K/uL — ABNORMAL LOW (ref 150.0–400.0)
RBC: 5.03 Mil/uL (ref 4.22–5.81)
RDW: 13.6 % (ref 11.5–15.5)
WBC: 6.1 K/uL (ref 4.0–10.5)

## 2024-11-10 LAB — URINE CULTURE
MICRO NUMBER:: 17462094
Result:: NO GROWTH
SPECIMEN QUALITY:: ADEQUATE

## 2024-11-10 LAB — URINALYSIS, ROUTINE W REFLEX MICROSCOPIC
Bilirubin Urine: NEGATIVE
Hgb urine dipstick: NEGATIVE
Ketones, ur: NEGATIVE
Leukocytes,Ua: NEGATIVE
Nitrite: NEGATIVE
RBC / HPF: NONE SEEN
Specific Gravity, Urine: 1.025 (ref 1.000–1.030)
Total Protein, Urine: NEGATIVE
Urine Glucose: >=1000 — AB
Urobilinogen, UA: 0.2 (ref 0.0–1.0)
pH: 5.5 (ref 5.0–8.0)

## 2024-11-10 LAB — BASIC METABOLIC PANEL WITH GFR
BUN: 20 mg/dL (ref 6–23)
CO2: 29 meq/L (ref 19–32)
Calcium: 9.5 mg/dL (ref 8.4–10.5)
Chloride: 103 meq/L (ref 96–112)
Creatinine, Ser: 0.83 mg/dL (ref 0.40–1.50)
GFR: 80.69 mL/min
Glucose, Bld: 88 mg/dL (ref 70–99)
Potassium: 4.5 meq/L (ref 3.5–5.1)
Sodium: 139 meq/L (ref 135–145)

## 2024-11-10 LAB — ALT: ALT: 22 U/L (ref 3–53)

## 2024-11-10 LAB — TSH: TSH: 1.63 u[IU]/mL (ref 0.35–5.50)

## 2024-11-10 LAB — PSA: PSA: 1.01 ng/mL (ref 0.10–4.00)

## 2024-11-10 LAB — URIC ACID: Uric Acid, Serum: 3.9 mg/dL — ABNORMAL LOW (ref 4.0–7.8)

## 2024-11-10 LAB — HEMOGLOBIN A1C: Hgb A1c MFr Bld: 5.7 % (ref 4.6–6.5)

## 2024-11-10 LAB — AST: AST: 22 U/L (ref 5–37)

## 2024-11-10 NOTE — Assessment & Plan Note (Signed)
 Here for CPX - Td 2021 -pnm 23- 2012;  prevnar: 2016; PNM 20: 2023 -  zostavax 05-2016 , s/p shingrix , s/p RSV - Had a flu and COVID-vaccine recently. -CCS: multiple cscopes,  10-2012 Dr Vinton; cscope again 08/2019, + polyps, Cscope 01/22/2023 : Cecal polyp, tubular adenoma (see Care Everywhere) - prostate ca screening: aged out  -Labs :BMP AST ALT CBC PSA UA urine culture uric acid - diet/exercise: Doing great, exercises 3  times a week, does some weight lifting.

## 2024-11-10 NOTE — Assessment & Plan Note (Signed)
 Here for CPX Other issues: HTN: BP looks good, recommend to check at home, continue Lasix , hydralazine  as needed, metoprolol , Entresto , Aldactone .  Checking a BMP Hypothyroidism: On Synthroid , check TSH. CAD, heart failure: On Plavix , Farxiga , Lasix , hydralazine  as needed, metoprolol .  Also Entresto  and Aldactone .  Has nitroglycerin , has not used it, recommend to refresh it regularly Chest wall pain: Check a chest x-ray LUTS: For the last 2 weeks as described above.  Has a history of gross hematuria with negative workup last year.  Symptoms are spontaneously subsiding. Check a UA urine culture and PSA.  Further advised with results. Hyperglycemia: Check A1c High cholesterol: Last LDL 56, continue allopurinol  History of melanoma: Per dermatology.   RTC 3 months

## 2024-11-11 NOTE — Addendum Note (Signed)
 Addended by: AMON SCHANZ E on: 11/11/2024 12:16 PM   Modules accepted: Level of Service

## 2024-11-12 ENCOUNTER — Ambulatory Visit: Payer: Self-pay | Admitting: Internal Medicine

## 2024-11-15 ENCOUNTER — Ambulatory Visit: Admitting: Cardiovascular Disease

## 2024-11-16 ENCOUNTER — Encounter: Payer: Self-pay | Admitting: Internal Medicine

## 2024-11-16 ENCOUNTER — Other Ambulatory Visit

## 2024-11-23 ENCOUNTER — Encounter: Admitting: Internal Medicine

## 2024-12-17 ENCOUNTER — Encounter: Admitting: Internal Medicine

## 2025-01-06 ENCOUNTER — Ambulatory Visit: Admitting: Internal Medicine

## 2025-02-08 ENCOUNTER — Ambulatory Visit: Admitting: Internal Medicine

## 2025-09-29 ENCOUNTER — Ambulatory Visit
